# Patient Record
Sex: Male | Born: 1945 | Race: White | Hispanic: No | Marital: Single | Smoking: Current every day smoker
Health system: Southern US, Community
[De-identification: ages and names within clinical notes are randomized; demographics above are authoritative.]

## PROBLEM LIST (undated history)

## (undated) DIAGNOSIS — E119 Type 2 diabetes mellitus without complications: Secondary | ICD-10-CM

## (undated) DIAGNOSIS — J449 Chronic obstructive pulmonary disease, unspecified: Secondary | ICD-10-CM

## (undated) DIAGNOSIS — I4892 Unspecified atrial flutter: Secondary | ICD-10-CM

## (undated) DIAGNOSIS — D696 Thrombocytopenia, unspecified: Secondary | ICD-10-CM

## (undated) DIAGNOSIS — I10 Essential (primary) hypertension: Secondary | ICD-10-CM

## (undated) DIAGNOSIS — C9590 Leukemia, unspecified not having achieved remission: Secondary | ICD-10-CM

## (undated) DIAGNOSIS — I82419 Acute embolism and thrombosis of unspecified femoral vein: Secondary | ICD-10-CM

## (undated) DIAGNOSIS — D649 Anemia, unspecified: Secondary | ICD-10-CM

## (undated) DIAGNOSIS — C4491 Basal cell carcinoma of skin, unspecified: Secondary | ICD-10-CM

## (undated) DIAGNOSIS — F0390 Unspecified dementia without behavioral disturbance: Secondary | ICD-10-CM

## (undated) DIAGNOSIS — I4891 Unspecified atrial fibrillation: Secondary | ICD-10-CM

---

## 2016-06-01 DIAGNOSIS — R7881 Bacteremia: Secondary | ICD-10-CM

## 2016-06-01 HISTORY — DX: Bacteremia: R78.81

## 2016-07-02 HISTORY — PX: MITRAL VALVE REPLACEMENT: SHX147

## 2019-12-08 ENCOUNTER — Other Ambulatory Visit: Payer: Self-pay

## 2019-12-08 ENCOUNTER — Encounter (HOSPITAL_COMMUNITY): Payer: Self-pay

## 2019-12-08 DIAGNOSIS — L988 Other specified disorders of the skin and subcutaneous tissue: Secondary | ICD-10-CM | POA: Diagnosis present

## 2019-12-08 NOTE — ED Triage Notes (Signed)
Pt presents with a mass on his left arm and wants it evaluated. Pt reports it has been present for 5-7 months. Hx on skin cancer on same arm.

## 2019-12-09 ENCOUNTER — Emergency Department (HOSPITAL_COMMUNITY)
Admission: EM | Admit: 2019-12-09 | Discharge: 2019-12-09 | Disposition: A | Discharge status: Home / Self Care | Payer: Medicare Other | Attending: Emergency Medicine | Admitting: Emergency Medicine

## 2019-12-09 DIAGNOSIS — L989 Disorder of the skin and subcutaneous tissue, unspecified: Secondary | ICD-10-CM

## 2019-12-09 NOTE — ED Notes (Signed)
Pt states he was told to come to the ED and get a note stating the area on his arm is not contagious so he can return to his residence.

## 2019-12-09 NOTE — ED Provider Notes (Signed)
[facility] DEPT Provider Note   CSN: [id number] Arrival date & time: 12/08/19  2206     History Chief Complaint  Patient presents with  . Mass    [name] is a 74 y.o. male.  Patient is a 74 year old male with no significant past medical history.  He presents today for evaluation of arm sores.  Patient tells me that these have been present for several years.  He was told at a hospital in [place] that he had skin cancer, however has not followed up for this.  Patient was told by a family member he needed to come here to make sure that these areas are not contagious.  Patient denies any fevers or chills.  The history is provided by the patient.       History reviewed. No pertinent past medical history.  There are no problems to display for this patient.   History reviewed. No pertinent surgical history.     No family history on file.  Social History   Tobacco Use  . Smoking status: Not on file  Substance Use Topics  . Alcohol use: Not on file  . Drug use: Not on file    Home Medications Prior to Admission medications   Not on File    Allergies    Patient has no known allergies.  Review of Systems   Review of Systems  All other systems reviewed and are negative.   Physical Exam Updated Vital Signs Ht 6' (1.829 m)   Wt 79.4 kg   BMI 23.73 kg/m   Physical Exam Vitals and nursing note reviewed.  Constitutional:      General: He is not in acute distress.    Appearance: Normal appearance. He is not ill-appearing.  HENT:     Head: Normocephalic and atraumatic.  Pulmonary:     Effort: Pulmonary effort is normal.  Skin:    General: Skin is warm and dry.     Comments: The left wrist is noted to have an indurated area measuring approximately 3 cm x 4 cm with central necrotic, dry skin.  There is a second area noted to the dorsal aspect of the mid forearm that appears indurated, swollen, and somewhat inflamed.  There is  no purulent discharge or warmth.  Neurological:     Mental Status: He is alert and oriented to person, place, and time.     ED Results / Procedures / Treatments   Labs (all labs ordered are listed, but only abnormal results are displayed) Labs Reviewed - No data to display  EKG None  Radiology No results found.  Procedures Procedures (including critical care time)  Medications Ordered in ED Medications - No data to display  ED Course  I have reviewed the triage vital signs and the nursing notes.  Pertinent labs & imaging results that were available during my care of the patient were reviewed by me and considered in my medical decision making (see chart for details).    MDM Rules/Calculators/A&P  Patient with arm lesions highly concerning for skin cancer.  He was told several years ago that the lesion to his wrist was cancerous, however never followed up for this.  Patient will be given follow-up information for dermatology.  He will also be given a note stating that these lesions are not contagious.  Final Clinical Impression(s) / ED Diagnoses Final diagnoses:  None    Rx / DC Orders ED Discharge Orders    None  Veryl Speak, MD 12/09/19 [phone]

## 2019-12-09 NOTE — Discharge Instructions (Addendum)
Follow-up with dermatology in the next week.  The contact information for the dermatology clinic has been provided in this discharge summary for you to call and make these arrangements.  I suspect these lesions on your arm are cancerous and they are not contagious.

## 2020-01-09 ENCOUNTER — Ambulatory Visit: Payer: Medicare Other | Admitting: Family Medicine

## 2020-01-26 ENCOUNTER — Ambulatory Visit: Payer: Medicare Other | Admitting: Family Medicine

## 2020-01-29 ENCOUNTER — Ambulatory Visit (INDEPENDENT_AMBULATORY_CARE_PROVIDER_SITE_OTHER): Payer: Medicare Other | Admitting: Family Medicine

## 2020-01-29 ENCOUNTER — Encounter: Payer: Self-pay | Admitting: Family Medicine

## 2020-01-29 ENCOUNTER — Other Ambulatory Visit: Payer: Self-pay

## 2020-01-29 VITALS — BP 121/85 | HR 125 | Ht 72.0 in | Wt 176.0 lb

## 2020-01-29 DIAGNOSIS — J209 Acute bronchitis, unspecified: Secondary | ICD-10-CM | POA: Insufficient documentation

## 2020-01-29 DIAGNOSIS — L989 Disorder of the skin and subcutaneous tissue, unspecified: Secondary | ICD-10-CM | POA: Diagnosis not present

## 2020-01-29 DIAGNOSIS — J440 Chronic obstructive pulmonary disease with acute lower respiratory infection: Secondary | ICD-10-CM

## 2020-01-29 DIAGNOSIS — R296 Repeated falls: Secondary | ICD-10-CM | POA: Diagnosis not present

## 2020-01-29 DIAGNOSIS — Z952 Presence of prosthetic heart valve: Secondary | ICD-10-CM | POA: Diagnosis not present

## 2020-01-29 DIAGNOSIS — Z951 Presence of aortocoronary bypass graft: Secondary | ICD-10-CM

## 2020-01-29 DIAGNOSIS — Z0000 Encounter for general adult medical examination without abnormal findings: Secondary | ICD-10-CM

## 2020-01-29 DIAGNOSIS — Z7689 Persons encountering health services in other specified circumstances: Secondary | ICD-10-CM | POA: Diagnosis present

## 2020-01-29 DIAGNOSIS — Z8679 Personal history of other diseases of the circulatory system: Secondary | ICD-10-CM | POA: Diagnosis not present

## 2020-01-29 NOTE — Assessment & Plan Note (Addendum)
Appears to be some gaps in medical care. Patient is poor historian and will likely need several visits to optimize his healthcare needs.  -CBC, CMP, HCV, lipid panel, TSH -CCM referral -Bring healthcare records/shot records -Follow up in 1 week

## 2020-01-29 NOTE — Assessment & Plan Note (Signed)
Current smoker. 61 year hx of smoking. No desire to quit at this time. No medications at this time.  -Consider CXR, if patient has acute respiratory symptoms -CCM referral -Follow up in 1 week

## 2020-01-29 NOTE — Progress Notes (Signed)
    SUBJECTIVE:   CHIEF COMPLAINT / HPI:   New Patient Visit/ Establish care [name] is a 74 yo M who presents today to establish care, recently lived in [place]. He is retired and living with his son and girlfriend. He does not take any prescription medications but take a lot of vitamins.   Multiple skin lesions Recently seen at [facility] 12/09/2019 for a possible skin cancer. Was referred to a dermatologist but has not been able to get in touch with the office. Actively bleeding today.   COPD Current tobacco pipe smoker. Smoking since the age of 74 yo. Uses his pipe 6x daily. Not interested in stopping at this time since he has done it for so long. Denies shortness of breath.    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 Age appropriate. Cardiac: RRR, normal heart sounds, no murmurs Respiratory: CTAB w/ long expiratory phase, normal effort Extremities: No edema or cyanosis. Skin:         Neuro: alert and oriented, no focal deficits Psych: normal affect   ASSESSMENT/PLAN:   Encounter to establish care Appears to be some gaps in medical care. Patient is poor historian and will likely need several visits to optimize his healthcare needs.  -CBC, CMP, HCV, lipid panel, TSH -CCM referral -Bring healthcare records/shot records -Follow up in 1 week    Skin lesions Acute. Left forearm actively bleeding prior to today's visit. ED visit >1 mont prior. Lesions look suspiciously like cancer. Attempted to call dermatology clinic on patient's AVS from ED x2 w/o success.  -Referral to dermatology placed -f/u prn  Acute bronchitis with COPD ([place]) Current smoker. 62 year hx of smoking. No desire to quit at this time. No medications at  this time.  -Consider CXR, if patient has acute respiratory symptoms -CCM referral -Follow up in 1 week    [place]

## 2020-01-29 NOTE — Assessment & Plan Note (Signed)
Acute. Left forearm actively bleeding prior to today's visit. ED visit >1 mont prior. Lesions look suspiciously like cancer. Attempted to call dermatology clinic on patient's AVS from ED x2 w/o success.  -Referral to dermatology placed -f/u prn

## 2020-01-29 NOTE — Patient Instructions (Addendum)
It was very nice to meet you today. Please enjoy the rest of your week. Today you were seen for a new patient visit.  I have referred you to the dermatologist. You should get a call to make an appointment in 1 week. If you do not hear from them please give [place] a call.   I have also referred your to our chronic care management team. Since you have not been to the doctor in awhile we will have to get you caught up on what mediations your should be on for your conditions.   Pleas consider quitting smoking. This will be beneficial for your overall health.   I have gotten some labs today. If abnormal I will give you a call. If normal I will send a letter via mail.    Follow up in 1-2 weeks for a follow up visit or sooner if needed. Please bring ALL vitamins and records to your next visit.   Please call the clinic at [phone] if you have any concerns. It was our pleasure to serve you.  Dr. Janus Molder

## 2020-01-30 ENCOUNTER — Encounter: Payer: Self-pay | Admitting: Family Medicine

## 2020-01-30 ENCOUNTER — Telehealth: Payer: Self-pay | Admitting: *Deleted

## 2020-01-30 DIAGNOSIS — Z0000 Encounter for general adult medical examination without abnormal findings: Secondary | ICD-10-CM

## 2020-01-30 DIAGNOSIS — D649 Anemia, unspecified: Secondary | ICD-10-CM

## 2020-01-30 LAB — COMPREHENSIVE METABOLIC PANEL
ALT: 17 IU/L (ref 0–44)
AST: 22 IU/L (ref 0–40)
Albumin/Globulin Ratio: 1.1 — ABNORMAL LOW (ref 1.2–2.2)
Albumin: 3.9 g/dL (ref 3.7–4.7)
Alkaline Phosphatase: 93 IU/L (ref 48–121)
BUN/Creatinine Ratio: 32 — ABNORMAL HIGH (ref 10–24)
BUN: 34 mg/dL — ABNORMAL HIGH (ref 8–27)
Bilirubin Total: 0.4 mg/dL (ref 0.0–1.2)
CO2: 25 mmol/L (ref 20–29)
Calcium: 9 mg/dL (ref 8.6–10.2)
Chloride: 104 mmol/L (ref 96–106)
Creatinine, Ser: 1.06 mg/dL (ref 0.76–1.27)
GFR calc Af Amer: 79 mL/min/{1.73_m2} (ref >59)
GFR calc non Af Amer: 68 mL/min/{1.73_m2} (ref >59)
Globulin, Total: 3.4 g/dL (ref 1.5–4.5)
Glucose: 101 mg/dL — ABNORMAL HIGH (ref 65–99)
Potassium: 4.5 mmol/L (ref 3.5–5.2)
Sodium: 139 mmol/L (ref 134–144)
Total Protein: 7.3 g/dL (ref 6.0–8.5)

## 2020-01-30 LAB — LIPID PANEL
Chol/HDL Ratio: 2.6 ratio (ref 0.0–5.0)
Cholesterol, Total: 153 mg/dL (ref 100–199)
HDL: 60 mg/dL (ref >39)
LDL Chol Calc (NIH): 77 mg/dL (ref 0–99)
Triglycerides: 82 mg/dL (ref 0–149)
VLDL Cholesterol Cal: 16 mg/dL (ref 5–40)

## 2020-01-30 LAB — CBC
Hematocrit: 34.7 % — ABNORMAL LOW (ref 37.5–51.0)
Hemoglobin: 11.2 g/dL — ABNORMAL LOW (ref 13.0–17.7)
MCH: 28 pg (ref 26.6–33.0)
MCHC: 32.3 g/dL (ref 31.5–35.7)
MCV: 87 fL (ref 79–97)
Platelets: 103 10*3/uL — ABNORMAL LOW (ref 150–450)
RBC: 4 x10E6/uL — ABNORMAL LOW (ref 4.14–5.80)
RDW: 14.3 % (ref 11.6–15.4)
WBC: 4.9 10*3/uL (ref 3.4–10.8)

## 2020-01-30 LAB — HCV INTERPRETATION

## 2020-01-30 LAB — TSH: TSH: 1.87 u[IU]/mL (ref 0.450–4.500)

## 2020-01-30 LAB — HCV AB W REFLEX TO QUANT PCR: HCV Ab: 0.1 s/co ratio (ref 0.0–0.9)

## 2020-01-30 NOTE — Chronic Care Management (AMB) (Signed)
  Care Management   Note  01/30/2020 Name: ASHWATH LASCH MRN: [id number] DOB: 1945/08/02  Arnette Schaumann Wiebelhaus is a 74 y.o. year old male who is a primary care patient of Patient, No Pcp Per. I reached out to [name] by phone today in response to a referral sent by Mr. Pearl Berlinger Kozlowski's health plan.    Mr. Dygert was given information about care management services today including:  1. Care management services include personalized support from designated clinical staff supervised by his physician, including individualized plan of care and coordination with other care providers 2. 24/7 contact phone numbers for assistance for urgent and routine care needs. 3. The patient may stop care management services at any time by phone call to the office staff.  Patient agreed to services and verbal consent obtained.   Follow up plan: Telephone appointment with care management team member scheduled for: 02/07/2020  [place] Management

## 2020-02-01 NOTE — Telephone Encounter (Signed)
Called patient's with results of low hemoglobin.  Has never had a colonoscopy. Denies any blood in stool.  Referral placed for colonoscopy during this encounter.  Patient understands will be called to schedule.  All other labs are normal.  [name], DO 02/01/2020, 5:47 PM PGY-2, [place]

## 2020-02-07 ENCOUNTER — Ambulatory Visit: Payer: Medicare Other

## 2020-02-07 DIAGNOSIS — Z139 Encounter for screening, unspecified: Secondary | ICD-10-CM

## 2020-02-08 NOTE — Chronic Care Management (AMB) (Signed)
Care Management   Initial Visit Note  02/08/2020 Name: [name] MRN: [id number] DOB: 1945/09/11   Assessment: [name] is a 74 y.o. year old male who sees [name], No Pcp Per for primary care. The care management team was consulted for assistance with care management and care coordination needs related to Disease Management Educational COPD  Needs.   Review of [name] status, including review of consultants reports, relevant laboratory and other test results, and collaboration with appropriate care team members and the [name]'s provider was performed as part of comprehensive [name] evaluation and provision of care management services.    SDOH (Social Determinants of Health) assessments performed: Yes See Care Plan activities for detailed interventions related to [facility])     Outpatient Encounter Medications as of 02/07/2020  Medication Sig  . aspirin EC 81 MG tablet Take 81 mg by mouth daily. Swallow whole.  . Multiple Vitamin (MULTI-VITAMIN DAILY) TABS Take by mouth.  . NON FORMULARY Take 2 tablets by mouth daily. [name] states that he takes Bladder release that he purchases online   No facility-administered encounter medications on file as of 02/07/2020.    Goals Addressed              This Visit's Progress   .  I can't walk far without hahaving to stop. (pt-stated)        CARE PLAN ENTRY (see longitudinal plan of care for additional care plan information) Current Barriers:   Knowledge deficit related to basic COPD self care/management- the [name] stated that he just moved here and he lives with his son and his girl friend.  He is unable to walk ling distances without being short of breath or doing a lot of thing with out being short of breath.  Where he lived he said he had an inhaler that was given to him for free but he lost it and does not remember the name.  He states that he currently smokes but he says smoking his tobacco pipe is cutting down for him.    Knowledge deficit related to basic understanding of how to use inhalers and how inhaled medications work  Knowledge deficit related to importance of energy conservation   Nurse Case Manager Clinical Goal(s):  Over the next 30 days [name] will report utilizing pursed lip breathing for shortness of breath  Over the next 30 days, [name] will be able to verbalize understanding of COPD action plan and when to seek appropriate levels of medical care   Interventions:   Provided [name] with basic verbal COPD education on self care/management/and exacerbation prevention   Provided [name] with COPD action plan and reinforced importance of daily self assessment  Will mail the [name]  written instructions on pursed lip breathing and COPD  Discussed with the [name] on smoking cessation and how it affects his breathing  Will send a referral to the care guides to help the [name] with transportation.  He has a friend that takes him places now but will be leaving on the 15th of this month and he will no longer have transportation.    [name] Self Care Activities:    Practice and use pursed lip breathing for shortness of breath recovery and prevention  [name] will try to cut down on his smoking        Follow up plan:  The care management team will reach out to the [name] again over the next 14 days.   Mr. Mohon was given information about Care Management  services today including:  1. Care Management services include personalized support from designated clinical staff supervised by a physician, including individualized plan of care and coordination with other care providers 2. 24/7 contact phone numbers for assistance for urgent and routine care needs. 3. The [name] may stop Care Management services at any time (effective at the end of the month) by phone call to the office staff.  [name] agreed to services and verbal consent obtained.  Lazaro Arms RN, BSN, [facility] Care  Management Coordinator [place] Phone: [phone] Fax: [phone]

## 2020-02-08 NOTE — Patient Instructions (Signed)
Visit Information  Goals Addressed              This Visit's Progress   .  I can't walk far without hahaving to stop. (pt-stated)        CARE PLAN ENTRY (see longitudinal plan of care for additional care plan information) Current Barriers:   Knowledge deficit related to basic COPD self care/management- the patient stated that he just moved here and he lives with his son and his girl friend.  He is unable to walk ling distances without being short of breath or doing a lot of thing with out being short of breath.  Where he lived he said he had an inhaler that was given to him for free but he lost it and does not remember the name.  He states that he currently smokes but he says smoking his tobacco pipe is cutting down for him.   Knowledge deficit related to basic understanding of how to use inhalers and how inhaled medications work  Knowledge deficit related to importance of energy conservation   Nurse Case Manager Clinical Goal(s):  Over the next 30 days patient will report utilizing pursed lip breathing for shortness of breath  Over the next 30 days, patient will be able to verbalize understanding of COPD action plan and when to seek appropriate levels of medical care   Interventions:   Provided patient with basic verbal COPD education on self care/management/and exacerbation prevention   Provided patient with COPD action plan and reinforced importance of daily self assessment  Will mail the patient  written instructions on pursed lip breathing and COPD  Discussed with the patient on smoking cessation and how it affects his breathing  Will send a referral to the care guides to help the patient with transportation.  He has a friend that takes him places now but will be leaving on the 15th of this month and he will no longer have transportation.    Patient Self Care Activities:    Practice and use pursed lip breathing for shortness of breath recovery and prevention  Patient  will try to cut down on his smoking       Mr. Lux was given information about Care Management services today including:  1. Care Management services include personalized support from designated clinical staff supervised by his physician, including individualized plan of care and coordination with other care providers 2. 24/7 contact phone numbers for assistance for urgent and routine care needs. 3. The patient may stop CCM services at any time (effective at the end of the month) by phone call to the office staff.  Patient agreed to services and verbal consent obtained.   The patient verbalized understanding of instructions provided today and declined a print copy of patient instruction materials.   The care management team will reach out to the patient again over the next 14 days.   Lazaro Arms RN, BSN, [facility] Care Management Coordinator [place] Phone: [phone] Fax: [phone]

## 2020-02-20 ENCOUNTER — Telehealth: Payer: Self-pay | Admitting: General Practice

## 2020-02-20 NOTE — Telephone Encounter (Signed)
[name] 02/20/2020 Called pt regarding community resource referral received. Left message for pt to call me back, my info is [phone] please see ref notes for more details.  [place], Care Management

## 2020-02-26 ENCOUNTER — Telehealth: Payer: Medicare Other

## 2020-03-04 ENCOUNTER — Telehealth: Payer: Medicare Other

## 2020-03-04 ENCOUNTER — Telehealth: Payer: Self-pay | Admitting: *Deleted

## 2020-03-04 NOTE — Chronic Care Management (AMB) (Signed)
  Care Management   Note  03/04/2020 Name: [name] MRN: [id number] DOB: 29-Dec-1945  [name] is a 74 y.o. year old male who is a primary care patient of Patient, No Pcp Per and is actively engaged with the care management team. I reached out to [name] by phone today to assist with re-scheduling a follow up visit with the RN Case Manager.  Follow up plan: Unsuccessful telephone outreach attempt made. A HIPAA compliant phone message was left for the patient providing contact information and requesting a return call. The care management team will reach out to the patient again over the next 7 days. If patient returns call to provider office, please advise to call [place] at [phone].  [place] Management

## 2020-03-08 NOTE — Chronic Care Management (AMB) (Signed)
  Care Management   Note  03/08/2020 Name: TAYGEN ACKLIN MRN: [id number] DOB: 1946/05/29  Arnette Schaumann Curci is a 74 y.o. year old male who is a primary care patient of Patient, No Pcp Per and is actively engaged with the care management team. I reached out to [name] by phone today to assist with re-scheduling a follow up visit with the RN Case Manager.  Follow up plan: Telephone appointment with care management team member scheduled for:03/18/2020  [place] Management

## 2020-03-18 ENCOUNTER — Ambulatory Visit: Payer: Medicare Other

## 2020-03-19 NOTE — Patient Instructions (Signed)
Visit Information  Goals Addressed              This Visit's Progress   .  I can't walk far without hahaving to stop. (pt-stated)        CARE PLAN ENTRY (see longitudinal plan of care for additional care plan information) Current Barriers:   Knowledge deficit related to basic COPD self care/management- the patient stated that he just moved here and he lives with his son and his girl friend.  He is unable to walk ling distances without being short of breath or doing a lot of thing with out being short of breath.  Where he lived he said he had an inhaler that was given to him for free but he lost it and does not remember the name.  He states that he currently smokes but he says smoking his tobacco pipe is cutting down for him.   Knowledge deficit related to basic understanding of how to use inhalers and how inhaled medications work  Knowledge deficit related to importance of energy conservation   Nurse Case Manager Clinical Goal(s):  Over the next 30 days patient will report utilizing pursed lip breathing for shortness of breath  Over the next 30 days, patient will be able to verbalize understanding of COPD action plan and when to seek appropriate levels of medical care   Interventions:   Provided patient with basic verbal COPD education on self care/management/and exacerbation prevention   Provided patient with COPD action plan and reinforced importance of daily self assessment  Will mail the patient  written instructions on pursed lip breathing and COPD  Discussed with the patient on smoking cessation and how it affects his breathing  Will send a referral to the care guides to help the patient with transportation.  He has a friend that takes him places now but will be leaving on the 15th of this month and he will no longer have transportation.  Spoke with the patient and he talked with the care guides but he didn't know about his medicaid information.  I asked him to call DSS  or have his sone to help him to find his medicaid number so we can help him with his transportation. He verbalized understanding and stated he would have some information for me at the next call.  Advised the patient that I would resend the educational material again for purse lip breathing and COPD information.   Patient Self Care Activities:    Practice and use pursed lip breathing for shortness of breath recovery and prevention  Patient will try to cut down on his smoking       The patient verbalized understanding of instructions provided today and declined a print copy of patient instruction materials.   The care management team will reach out to the patient again over the next 14 days.   Shawn Arms RN, BSN, [facility] Care Management Coordinator [place] Phone: [phone] Fax: [phone]

## 2020-03-19 NOTE — Chronic Care Management (AMB) (Signed)
Care Management   Follow Up Note   03/19/2020 Name: [name] MRN: [id number] DOB: 03-07-1946  Referred by: Patient, No Pcp Per Reason for referral : Appointment (COPD)   ALVA BROXSON is a 74 y.o. year old male who is a primary care patient of Patient, No Pcp Per. The care management team was consulted for assistance with care management and care coordination needs.    Review of patient status, including review of consultants reports, relevant laboratory and other test results, and collaboration with appropriate care team members and the patient's provider was performed as part of comprehensive patient evaluation and provision of chronic care management services.    SDOH (Social Determinants of Health) assessments performed: No See Care Plan activities for detailed interventions related to [facility])     Advanced Directives: See Care Plan and [employer] application for related entries.   Goals Addressed              This Visit's Progress   .  I can't walk far without hahaving to stop. (pt-stated)        CARE PLAN ENTRY (see longitudinal plan of care for additional care plan information) Current Barriers:   Knowledge deficit related to basic COPD self care/management- the patient stated that he just moved here and he lives with his son and his girl friend.  He is unable to walk ling distances without being short of breath or doing a lot of thing with out being short of breath.  Where he lived he said he had an inhaler that was given to him for free but he lost it and does not remember the name.  He states that he currently smokes but he says smoking his tobacco pipe is cutting down for him.   Knowledge deficit related to basic understanding of how to use inhalers and how inhaled medications work  Knowledge deficit related to importance of energy conservation   Nurse Case Manager Clinical Goal(s):  Over the next 30 days patient will report utilizing pursed lip breathing for  shortness of breath  Over the next 30 days, patient will be able to verbalize understanding of COPD action plan and when to seek appropriate levels of medical care   Interventions:   Provided patient with basic verbal COPD education on self care/management/and exacerbation prevention   Provided patient with COPD action plan and reinforced importance of daily self assessment  Will mail the patient  written instructions on pursed lip breathing and COPD  Discussed with the patient on smoking cessation and how it affects his breathing  Will send a referral to the care guides to help the patient with transportation.  He has a friend that takes him places now but will be leaving on the 15th of this month and he will no longer have transportation.  Spoke with the patient and he talked with the care guides but he didn't know about his medicaid information.  I asked him to call DSS or have his sone to help him to find his medicaid number so we can help him with his transportation. He verbalized understanding and stated he would have some information for me at the next call.  Advised the patient that I would resend the educational material again for purse lip breathing and COPD information.   Patient Self Care Activities:    Practice and use pursed lip breathing for shortness of breath recovery and prevention  Patient will try to cut down on his smoking  The care management team will reach out to the patient again over the next 14 days.   Lazaro Arms RN, BSN, [facility] Care Management Coordinator [place] Phone: [phone] Fax: [phone]

## 2020-04-04 ENCOUNTER — Telehealth: Payer: Medicare Other

## 2020-04-05 ENCOUNTER — Ambulatory Visit: Payer: Medicare Other

## 2020-04-05 NOTE — Patient Instructions (Signed)
Visit Information  Goals Addressed              This Visit's Progress   .  I can't walk far without having to stop. (pt-stated)        CARE PLAN ENTRY (see longitudinal plan of care for additional care plan information) Current Barriers:   Knowledge deficit related to basic COPD self care/management- the patient stated that he just moved here and he lives with his son and his girl friend.  He is unable to walk ling distances without being short of breath or doing a lot of thing with out being short of breath.  Where he lived he said he had an inhaler that was given to him for free but he lost it and does not remember the name.  He states that he currently smokes but he says smoking his tobacco pipe is cutting down for him.   Knowledge deficit related to basic understanding of how to use inhalers and how inhaled medications work  Knowledge deficit related to importance of energy conservation   Nurse Case Manager Clinical Goal(s):  Over the next 30 days patient will report utilizing pursed lip breathing for shortness of breath  Over the next 30 days, patient will be able to verbalize understanding of COPD action plan and when to seek appropriate levels of medical care   Interventions:   Provided patient with basic verbal COPD education on self care/management/and exacerbation prevention   Provided patient with COPD action plan and reinforced importance of daily self assessment  Will mail the patient  written instructions on pursed lip breathing and COPD  Discussed with the patient on smoking cessation and how it affects his breathing  Will send a referral to the care guides to help the patient with transportation.  He has a friend that takes him places now but will be leaving on the 15th of this month and he will no longer have transportation.  Spoke with the patient and he has not followed up with the information that was given to him.    He states that he wants information  for medicaid and to talk with some one about medicare insurance.  RNCM gave the patient  Medicaid  - local Department of social Services on online applications   DEPARTMENT OF SOCIAL SERVICES: [place], [place] [address]    [phone]  [place] [email or web address]   Call [phone]  I gave him my contact information to follow up with me if he has any questions or concerns.  He states that he will follow up with the numbers.  He stays with his son and daughter in law and  they will help him.  Patient Self Care Activities:    Practice and use pursed lip breathing for shortness of breath recovery and prevention  Patient will try to cut down on his smoking       Mr. Manthei was given information about Care Management services today including:  1. Care Management services include personalized support from designated clinical staff supervised by his physician, including individualized plan of care and coordination with other care providers 2. 24/7 contact phone numbers for assistance for urgent and routine care needs. 3. The patient may stop CCM services at any time (effective at the end of the month) by phone call to the office staff.  Patient agreed to services and verbal consent obtained.   The patient verbalized understanding of instructions provided today and declined a print copy of patient instruction  materials.   Plan: Telephone follow up with [name] over the next 14 days giving the patient time to reach out to the resources and he has my contact information if he has any question  Lazaro Arms RN, BSN, [place] Phone: [phone] Fax: [phone]

## 2020-04-05 NOTE — Chronic Care Management (AMB) (Signed)
RN  Care Management   Follow Up Note   04/05/2020 Name: RAVEN HARMES MRN: [id number] DOB: 10/16/1945  Reason for referral : Care Coordination (Transportation)   SADAO WEYER is a 74 y.o. year old male who is a primary care patient of Patient, No Pcp Per. The care management team was consulted for assistance with care management and care coordination needs.    Subjective:  I have not follow up with the resource information"  Assessment: called the patient to see if he has follow up with any resource information and he states that he has not .   Goals    .  I can't walk far without having to stop. (pt-stated)      CARE PLAN ENTRY (see longitudinal plan of care for additional care plan information) Current Barriers:   Knowledge deficit related to basic COPD self care/management- the patient stated that he just moved here and he lives with his son and his girl friend.  He is unable to walk ling distances without being short of breath or doing a lot of thing with out being short of breath.  Where he lived he said he had an inhaler that was given to him for free but he lost it and does not remember the name.  He states that he currently smokes but he says smoking his tobacco pipe is cutting down for him.   Knowledge deficit related to basic understanding of how to use inhalers and how inhaled medications work  Knowledge deficit related to importance of energy conservation   Nurse Case Manager Clinical Goal(s):  Over the next 30 days patient will report utilizing pursed lip breathing for shortness of breath  Over the next 30 days, patient will be able to verbalize understanding of COPD action plan and when to seek appropriate levels of medical care   Interventions:   Provided patient with basic verbal COPD education on self care/management/and exacerbation prevention   Provided patient with COPD action plan and reinforced importance of daily self assessment  Will mail the  patient  written instructions on pursed lip breathing and COPD  Discussed with the patient on smoking cessation and how it affects his breathing  Will send a referral to the care guides to help the patient with transportation.  He has a friend that takes him places now but will be leaving on the 15th of this month and he will no longer have transportation.  Spoke with the patient and he has not followed up with the information that was given to him.    He states that he wants information for medicaid and to talk with some one about medicare insurance.  RNCM gave the patient  Medicaid  - local Department of social Services on online applications   DEPARTMENT OF SOCIAL SERVICES: [place], [place] [address]    [phone]  [place] [email or web address]   Call [phone]  I gave him my contact information to follow up with me if he has any questions or concerns.  He states that he will follow up with the numbers.  He stays with his son and daughter in law and  they will help him.  Patient Self Care Activities:    Practice and use pursed lip breathing for shortness of breath recovery and prevention  Patient will try to cut down on his smoking        Review of patient status, including review of consultants reports, relevant laboratory and other test results,  and collaboration with appropriate care team members and the patient's provider was performed as part of comprehensive patient evaluation and provision of chronic care management services.    SDOH (Social Determinants of Health) assessments performed: No See Care Plan activities for detailed interventions related to SDOH)         Plan: Telephone follow up with [name] over the next 14 days giving the patient time to reach out to the resources and he has my contact information if he has any questions.Lazaro Arms RN, BSN, [facility] Care Management Coordinator [place] Phone: [phone] Fax: [phone]

## 2020-04-19 ENCOUNTER — Ambulatory Visit: Payer: Medicare Other

## 2020-04-19 NOTE — Patient Instructions (Signed)
Visit Information  Goals Addressed              This Visit's Progress   .  I can't walk far without having to stop. (pt-stated)        Patient Self Care Activities:  . Patient verbalizes understanding of plan .  Self-administers medications as prescribed . Calls pharmacy for medication refills . Call's provider office for new concerns or questions . Practice and use pursed lip breathing for shortness of breath recovery and prevention . Patient will try to cut down on his smoking . Follow up on information given him for medicaid and call care guides to help with transportation        [name] was given information about Care Management services today including:  1. Care Management services include personalized support from designated clinical staff supervised by his physician, including individualized plan of care and coordination with other care providers 2. 24/7 contact phone numbers for assistance for urgent and routine care needs. 3. The patient may stop CCM services at any time (effective at the end of the month) by phone call to the office staff.  Patient agreed to services and verbal consent obtained.   The patient verbalized understanding of instructions, educational materials, and care plan provided today and declined offer to receive copy of patient instructions, educational materials, and care plan.   Follow up Plan:  The patient will call RNCM as advised to what he plans to do about his medicaid and transportation.  RNCM will wait and follow back up with the patient in one month to see progress.  Shawn Arms RN, BSN, [facility] Care Management Coordinator [place] Phone: [phone] I Fax: [phone]

## 2020-04-19 NOTE — Chronic Care Management (AMB) (Signed)
RN  Care Management   Follow Up Note   04/19/2020 Name: [name] MRN: [id number] DOB: [birth year]  Reason for referral : Care Coordination (Paperwork follow up)   [name] is a 74 y.o. year old male who is a primary care patient of [place], Naaman Plummer, DO. The care management team was consulted for assistance with care management and care coordination needs.      Assessment: Called to follow with the patient to see if he had called Medicaid and had his son and daughter  In law to help.  He stated that he still had not and would try today or next week.  Patient Care Plan: RN Care Manager  Problem Identified: COPD   Goal: COPD Management   Start Date: 04/19/2020  Expected End Date: 05/31/2020  Note:   Current Barriers:   Knowledge deficit related to basic COPD self care/management- the patient stated that he just moved here and he lives with his son and his girl friend.  He is unable to walk ling distances without being short of breath or doing a lot of thing with out being short of breath.  Where he lived he said he had an inhaler that was given to him for free but he lost it and does not remember the name.  He states that he currently smokes but he says smoking his tobacco pipe is cutting down for him.   Knowledge deficit related to basic understanding of how to use inhalers and how inhaled medications work  Knowledge deficit related to importance of energy conservation   Nurse Case Manager Clinical Goal(s):  Over the next 30 days patient will report utilizing pursed lip breathing for shortness of breath  Over the next 30 days, patient will be able to verbalize understanding of COPD action plan and when to seek appropriate levels of medical care   Interventions:   Provided patient with basic verbal COPD education on self care/management/and exacerbation prevention   Provided patient with COPD action plan and reinforced importance of daily self  assessment  Will mail the patient  written instructions on pursed lip breathing and COPD- Patient received information  Discussed with the patient on smoking cessation and how it affects his breathing  Will send a referral to the care guides to help the patient with transportation.  He has a friend that takes him places now but will be leaving on the 15th of this month and he will no longer have transportation.  Spoke with the patient again today and he has not called about his medicaid nor has he contact anyone about transportation.  As we were talking he stated that he had to get off the phone and he would call me back.   Patient Self Care Activities:  . Patient verbalizes understanding of plan .  Self-administers medications as prescribed . Calls pharmacy for medication refills . Call's provider office for new concerns or questions . Practice and use pursed lip breathing for shortness of breath recovery and prevention . Patient will try to cut down on his smoking . Follow up on information given him for medicaid and call care guides to help with transportation   Follow up Plan:  The patient will call RNCM as advised to what he plans to do about his medicaid and transportation.  RNCM will wait and follow back up with the patient in one month to see progress.    Review of patient status, including review of consultants reports, relevant laboratory  and other test results, and collaboration with appropriate care team members and the patient's provider was performed as part of comprehensive patient evaluation and provision of chronic care management services.    SDOH (Social Determinants of Health) assessments performed: No See Care Plan activities for detailed interventions related to SDOH)     [place], BSN, [place] Management Coordinator [place] Phone: [phone] Fax: [phone]

## 2020-05-06 ENCOUNTER — Ambulatory Visit: Payer: Medicare Other | Admitting: Family Medicine

## 2020-05-10 ENCOUNTER — Ambulatory Visit: Payer: Medicare Other

## 2020-05-21 ENCOUNTER — Telehealth: Payer: Self-pay

## 2020-05-21 ENCOUNTER — Telehealth: Payer: Medicare Other

## 2020-05-21 NOTE — Telephone Encounter (Signed)
  Care Management   Outreach Note  05/21/2020 Name: NEELY CECENA MRN: [id number] DOB: 26-Jan-1946  Referred by: [name], DO Reason for referral : Chronic Care Management (Transportation)   [name] is enrolled in a Managed [place]: No  An unsuccessful telephone outreach was attempted today. The patient was referred to the case management team for assistance with care management and care coordination.   Follow Up Plan: A HIPAA compliant phone message was left for the patient providing contact information and requesting a return call.  The care management team will reach out to the patient again over the next 7-14 days.   Lazaro Arms RN, BSN, [facility] Care Management Coordinator [place] Phone: [phone] I Fax: [phone]

## 2020-05-27 NOTE — Telephone Encounter (Signed)
[name]   Called spoke with patient rescheduled for follow up call with RNCM on 06/06/2020

## 2020-06-06 ENCOUNTER — Ambulatory Visit: Payer: Medicare Other

## 2020-06-06 DIAGNOSIS — L989 Disorder of the skin and subcutaneous tissue, unspecified: Secondary | ICD-10-CM

## 2020-06-06 DIAGNOSIS — Z139 Encounter for screening, unspecified: Secondary | ICD-10-CM

## 2020-06-07 NOTE — Chronic Care Management (AMB) (Signed)
Care Management   RN Case Manager Follow Up Note  06/07/2020 Name: [name] MRN: [id number] DOB: [birth year] [name] is a 75 y.o. year old male who sees Autry-Lott, [place], DO for primary care.  Patient is enrolled in a Managed Medicaid plan: No.  The Care Management team was consulted by PCPto assist the patient with . Disease Management, Educational Needs and Care Coordination.   RNCM engaged with [name] today Engaged with patient by telephone in response to provider referral for RN case management and/or care coordination services. See care plan below for details during this encounter.  Follow up Plan: RNCM will wait and follow back up with the patient in 14 days to see progress.   Advanced Directives Status:Not addressed in this encounter.     SDOH (Social Determinants of Health) assessments performed: No     Patient Care Plan: RN Care Manager  Problem Identified: COPD  Goal: COPD Management   Start Date: 04/19/2020  Expected End Date: 05/31/2020  Note:   Current Barriers:   Knowledge deficit related to basic COPD self care/management- the patient stated that he just moved here and he lives with his son and his girl friend.  He is unable to walk ling distances without being short of breath or doing a lot of thing with out being short of breath.  Where he lived he said he had an inhaler that was given to him for free but he lost it and does not remember the name.  He states that he currently smokes but he says smoking his tobacco pipe is cutting down for him.   Knowledge deficit related to basic understanding of how to use inhalers and how inhaled medications work  Knowledge deficit related to importance of energy conservation   Nurse Case Manager Clinical Goal(s):  Over the next 30 days patient will report utilizing pursed lip breathing for shortness of breath  Over the next 30 days, patient will be able to verbalize understanding of COPD action plan  and when to seek appropriate levels of medical care   Interventions:   Provided patient with basic verbal COPD education on self care/management/and exacerbation prevention   Provided patient with COPD action plan and reinforced importance of daily self assessment  Will mail the patient  written instructions on pursed lip breathing and COPD- Patient received information  Discussed with the patient on smoking cessation and how it affects his breathing Patient states that he has cut down on his smoking about 25 %  Spoke with the patient again today and he has not called about his medicaid nor has he contact anyone about transportation.  I will send a referral to care guides to set him up with [facility] transportation .  I have set him up another appointment with [facility] 08-16-20 at 11 am.  Patient Self Care Activities:  . Patient verbalizes understanding of plan .  Self-administers medications as prescribed . Calls pharmacy for medication refills . Call's provider office for new concerns or questions . Practice and use pursed lip breathing for shortness of breath recovery and prevention . Patient will try to cut down on his smoking . Follow up on information given him for medicaid and call care guides to help with transportation        Outpatient Encounter Medications as of 06/06/2020  Medication Sig  . aspirin EC 81 MG tablet Take 81 mg by mouth daily. Swallow whole.  . Multiple Vitamin (MULTI-VITAMIN DAILY) TABS Take  by mouth.  . NON FORMULARY Take 2 tablets by mouth daily. Patient states that he takes Bladder release that he purchases online   No facility-administered encounter medications on file as of 06/06/2020.    Review of patient status, including review of consultants reports, relevant laboratory and other test results, and collaboration with appropriate care team members and the patient's provider was performed as part of comprehensive patient evaluation and  provision of chronic care management services.       Information about Care Management services was shared with Mr.  Mcenery today including:  1. Care Management services include personalized support from designated clinical staff supervised by his physician, including individualized plan of care and coordination with other care providers 2. Remind patient of 24/7 contact phone numbers to provider's office for assistance with urgent and routine care needs. 3. Care Management services are voluntary and patient may stop at any time .   Patient agreed to services provided today and verbal consent obtained.

## 2020-06-07 NOTE — Patient Instructions (Signed)
  [name]  it was nice speaking with you. Please call me directly [phone] if you have questions about the goals we discussed.  Patient Self Care Activities:  . Patient verbalizes understanding of plan .  Self-administers medications as prescribed . Calls pharmacy for medication refills . Call's provider office for new concerns or questions . Practice and use pursed lip breathing for shortness of breath recovery and prevention . Patient will try to cut down on his smoking . Follow up  with care guides to help with transportation     Patient Care Plan: RN Care Manager  Problem Identified: COPD   Goal: COPD Management   Start Date: 04/19/2020  Expected End Date: 05/31/2020  Note:   Current Barriers:   Knowledge deficit related to basic COPD self care/management- the patient stated that he just moved here and he lives with his son and his girl friend.  He is unable to walk ling distances without being short of breath or doing a lot of thing with out being short of breath.  Where he lived he said he had an inhaler that was given to him for free but he lost it and does not remember the name.  He states that he currently smokes but he says smoking his tobacco pipe is cutting down for him.   Knowledge deficit related to basic understanding of how to use inhalers and how inhaled medications work  Knowledge deficit related to importance of energy conservation   Nurse Case Manager Clinical Goal(s):  Over the next 30 days patient will report utilizing pursed lip breathing for shortness of breath  Over the next 30 days, patient will be able to verbalize understanding of COPD action plan and when to seek appropriate levels of medical care   Interventions:   Provided patient with basic verbal COPD education on self care/management/and exacerbation prevention   Provided patient with COPD action plan and reinforced importance of daily self assessment  Will mail the patient  written  instructions on pursed lip breathing and COPD- Patient received information  Discussed with the patient on smoking cessation and how it affects his breathing Patient states that he has cut down on his smoking about 25 %  Spoke with the patient again today and he has not called about his medicaid nor has he contact anyone about transportation.  I will send a referral to care guides to set him up with [facility] transportation .  I have set him up another appointment with [facility] 08-16-20 at 11 am.  Follow up Plan:  .  RNCM will wait and follow back up with the patient in 14 days to see progress.      [name] received Care Management services today:  1. Care Management services include personalized support from designated clinical staff supervised by his physician, including individualized plan of care and coordination with other care providers 2. 24/7 contact [phone] for assistance for urgent and routine care needs. 3. Care Management are voluntary services and be declined at any time by calling the office.  The patient verbalized understanding of instructions provided today and declined a print copy of patient instruction materials.    Lazaro Arms, RN

## 2020-06-11 ENCOUNTER — Telehealth: Payer: Self-pay

## 2020-06-11 NOTE — Telephone Encounter (Signed)
    MA1/04/2021 1st Attempt  Name: [name]   MRN: [id number]   DOB: 13-Nov-1945   AGE: 75 y.o.   GENDER: male   PCP Autry-Lott, [place], DO.   Referral Reason: Transportation Needs   Interventions: Successful outbound call placed to the patient Patient provided with information about care guide support team and interviewed to confirm resource needs Placed referral to [employer] via email. Spoke with Suezanne Jacquet to confirm it has been received. The patient will be called once his information has been entered and he will be called. I requested a email confirmation be sent to me. I will forward the confirmation the to patient as requested.  Follow up plan: Care guide will follow up with patient by phone over the next 5 days     Brynlei Klausner, AAS Paralegal, [place] . Embedded Care Coordination [facility]  Care Management  [address], [place] [address] [email or web address]  [phone]   [email or web address]

## 2020-06-18 ENCOUNTER — Telehealth: Payer: Self-pay

## 2020-06-18 NOTE — Telephone Encounter (Signed)
   Telephone encounter was:  Successful.  06/18/2020 Name: BLAYDEN CONWELL MRN: [id number] DOB: 13-Aug-1945  BENNETT RAM is a 75 y.o. year old male who is a primary care patient of [place], Naaman Plummer, DO . The community resource team was consulted for assistance with Transportation Needs   Care guide performed the following interventions: Follow up call placed to community resources to determine status of patients referral Follow up call placed to the patient to discuss status of referral Spoke with Suezanne Jacquet at [employer] he is processing request and stated he would call the patient. Patient has not received a confirmation call yet..  Follow Up Plan:  Care guide will follow up with patient by phone over the next 5 days and I will follow-up again with [facility].  SIG Ambrose Mantle

## 2020-06-20 ENCOUNTER — Telehealth: Payer: Medicare Other

## 2020-06-20 ENCOUNTER — Telehealth: Payer: Self-pay

## 2020-06-20 ENCOUNTER — Telehealth: Payer: Self-pay | Admitting: Family Medicine

## 2020-06-20 NOTE — Telephone Encounter (Signed)
   Telephone encounter was:  Successful.  06/20/2020 Name: [name] MRN: [id number] DOB: 19-Oct-1945  [name] is a 75 y.o. year old male who is a primary care patient of [place], Naaman Plummer, [place] . The community resource team was consulted for assistance with Transportation Needs   Care guide performed the following interventions: Follow up call placed to community resources to determine status of patients referral [name] at [employer] the following:I was able to get in touch with [name] with the help of one of my colleagues [name] and he has been enrolled in our program and his transportation has been arranged for August 16, 2020. Forwarded confirmation to [employer]  .[name]  Follow Up Plan:  No further follow up planned at this time. The patient has been provided with needed resources.  [name], AAS Paralegal, [place] . Embedded Care Coordination [facility]  Care Management  [address], [place] [address] ??[email or web address]  ?? [phone]   [email or web address]

## 2020-06-20 NOTE — Telephone Encounter (Signed)
   Telephone encounter was:  Successful.  06/20/2020 Name: [name] MRN: [id number] DOB: 08/28/1945  [name] is a 75 y.o. year old male who is a primary care patient of [place], Naaman Plummer, [place] . The community resource team was consulted for assistance with Transportation Needs   Care guide performed the following interventions: Follow up call placed to the patient to discuss status of referral Spoke with [name] at [employer]. He has called and left messages for the patient to return the call to complete his enrollment.  Spoke with patient gave him the number to call to complete his enrollement and set-up transportation for his 08/16/20 appointment. Patient stated he understood and would call them today..  Follow Up Plan:  Care guide will follow up with patient by phone over the next 7 days  [name], AAS Paralegal, [place] . Embedded Care Coordination [facility]  Care Management  [address], [place] [address] ??[email or web address]  ?? [phone]   [email or web address]

## 2020-06-20 NOTE — Telephone Encounter (Signed)
   KALETH KOY DOB: 1945/09/23 MRN: [id number]   RIDER WAIVER AND RELEASE OF LIABILITY  For purposes of improving physical access to our facilities, [facility] is pleased to partner with third parties to provide [facility] patients or other authorized individuals the option of convenient, on-demand ground transportation services (the [place]") through use of the technology service that enables users to request on-demand ground transportation from independent third-party providers.  By opting to use and accept these [employer], I, the undersigned, hereby agree on behalf of myself, and on behalf of any minor child using the [employer] for whom I am the parent or legal guardian, as follows:  1. Government social research officer provided to me are provided by independent third-party transportation providers who are not [employer] or employees and who are unaffiliated with [employer]. 2. [facility] is neither a transportation carrier nor a common or public carrier. 3. [facility] has no control over the quality or safety of the transportation that occurs as a result of the [employer]. 4. [facility] cannot guarantee that any third-party transportation provider will complete any arranged transportation service. 5. [facility] makes no representation, warranty, or guarantee regarding the reliability, timeliness, quality, safety, suitability, or availability of any of the Transport Services or that they will be error free. 6. I fully understand that traveling by vehicle involves risks and dangers of serious bodily injury, including permanent disability, paralysis, and death. I agree, on behalf of myself and on behalf of any minor child using the Transport Services for whom I am the parent or legal guardian, that the entire risk arising out of my use of the [employer] remains solely with me, to the maximum extent permitted under applicable law. 7. The [employer] are provided "as is" and "as available." [facility] disclaims all representations and warranties, express, implied or statutory, not expressly set out in these terms, including the implied warranties of merchantability and fitness for a particular purpose. 8. I hereby waive and release [facility], its agents, employees, officers, directors, representatives, insurers, attorneys, assigns, successors, subsidiaries, and affiliates from any and all past, present, or future claims, demands, liabilities, actions, causes of action, or suits of any kind directly or indirectly arising from acceptance and use of the [employer]. 9. I further waive and release [place] and its affiliates from all present and future liability and responsibility for any injury or death to persons or damages to property caused by or related to the use of the [employer]. 10. I have read this Waiver and Release of Liability, and I understand the terms used in it and their legal significance. This Waiver is freely and voluntarily given with the understanding that my right (as well as the right of any minor child for whom I am the parent or legal guardian using the [employer]) to legal recourse against  in connection with the [employer] is knowingly surrendered in return for use of these services.   I attest that I read the consent document to [name], gave Mr. Poke the opportunity to ask questions and answered the questions asked (if any). I affirm that [name] then provided consent for he's participation in this program.     Legrand Pitts

## 2020-06-20 NOTE — Telephone Encounter (Signed)
  Care Management   Outreach Note  06/20/2020 Name: [name] MRN: [id number] DOB: 05-24-1946  Referred by: [name], DO Reason for referral : Chronic Care Management (COPD  Transportation)   [name] is enrolled in a Managed [place]: No  An unsuccessful telephone outreach was attempted today. The patient was referred to the case management team for assistance with care management and care coordination.   Follow Up Plan: A HIPAA compliant phone message was left for the patient providing contact information and requesting a return call.  The care management team will reach out to the patient again over the next 7-14 days.   Lazaro Arms RN, BSN, [facility] Care Management Coordinator [place] Phone: [phone] I Fax: [phone]

## 2020-06-21 NOTE — Telephone Encounter (Signed)
Called spoke to pt rescheduled for 06/28/2020

## 2020-06-28 ENCOUNTER — Ambulatory Visit: Payer: Medicare Other

## 2020-06-28 NOTE — Patient Instructions (Signed)
Visit Information  Goals Addressed              This Visit's Progress   .  I can't walk far without having to stop. (pt-stated)        Timeframe:  Long-Range Goal Priority:  High Start Date:   9/921                          Expected End Date:  09/27/20                      Patient Goals/Self Care Activities:  . Patient verbalizes understanding of plan .  Self-administers medications as prescribed . Calls pharmacy for medication refills . Call's provider office for new concerns or questions . Practice and use pursed lip breathing for shortness of breath recovery and prevention . Patient will try to cut down on his smoking . Follow up on information given him for medicaid and call care guides to help with transportation        The patient verbalized understanding of instructions, educational materials, and care plan provided today and declined offer to receive copy of patient instructions, educational materials, and care plan.   Follow up Plan: Patient would like continued follow-up.  CCM RNCM will outreach the patient within the next 30 days.. Patient will call office if needed prior to next encounter  Lazaro Arms RN, BSN, [facility] Care Management Coordinator [place] Phone: [phone] I Fax: [phone]

## 2020-06-28 NOTE — Chronic Care Management (AMB) (Signed)
Care Management    RN Visit Note  06/28/2020 Name: [name] MRN: [id number] DOB: 1945/06/12  Subjective: [name] is a 75 y.o. year old male who is a primary care patient of [place], Naaman Plummer, DO. The care management team was consulted for assistance with disease management and care coordination needs.    Engaged with patient by telephone for follow up visit in response to provider referral for case management and/or care coordination services.   Consent to Services:   [name] was given information about Care Management services today including:  1. Care Management services includes personalized support from designated clinical staff supervised by his physician, including individualized plan of care and coordination with other care providers 2. 24/7 contact phone numbers for assistance for urgent and routine care needs. 3. The patient may stop case management services at any time by phone call to the office staff.  Patient agreed to services and consent obtained.    Assessment: Patient continues to experience difficulty with attending follow  up appointments.  .. See Care Plan below for interventions and patient self-care actives. Follow up Plan: Patient would like continued follow-up.  CCM RNCM  will outreach the patient within the next 30 days.. Patient will call office if needed prior to next encounter Review of patient past medical history, allergies, medications, health status, including review of consultants reports, laboratory and other test data, was performed as part of comprehensive evaluation and provision of chronic care management services.   SDOH (Social Determinants of Health) assessments and interventions performed:    Care Plan  No Known Allergies  Outpatient Encounter Medications as of 06/28/2020  Medication Sig  . aspirin EC 81 MG tablet Take 81 mg by mouth daily. Swallow whole.  . Multiple Vitamin (MULTI-VITAMIN DAILY) TABS Take by mouth.  .  NON FORMULARY Take 2 tablets by mouth daily. Patient states that he takes Bladder release that he purchases online   No facility-administered encounter medications on file as of 06/28/2020.    Patient Active Problem List   Diagnosis Date Noted  . Acute bronchitis with COPD ([place]) 01/29/2020  . Skin lesions 01/29/2020  . 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 Hx of bacterial endocarditis 01/29/2020  . Frequent falls 01/29/2020  . H/O mitral valve replacement 01/29/2020    Conditions to be addressed/monitored: COPD  Care Plan : RN Care Manager  Updates made by Lazaro Arms, RN since 06/28/2020 12:00 AM  Problem: COPD   Goal: COPD Management   Start Date: 04/19/2020  Expected End Date: 05/31/2020  Current Barriers:   Knowledge deficit related to basic COPD self care/management- the patient stated that he just moved here and he lives with his son and his girl friend.  He is unable to walk ling distances without being short of breath or doing a lot of thing with out being short of breath.  Where he lived he said he had an inhaler that was given to him for free but he lost it and does not remember the name.  He states that he currently smokes but he says smoking his tobacco pipe is cutting down for him.   Knowledge deficit related to basic understanding of how to use inhalers and how inhaled medications work  Knowledge deficit related to importance of energy conservation   Nurse Case Manager Clinical Goal(s):  Over the next 30 days patient will report utilizing pursed lip breathing for shortness of breath  Over the next 30 days, patient will be  able to verbalize understanding of COPD action plan and when to seek appropriate levels of medical care   Interventions:   Provided patient with basic verbal COPD education on self care/management/and exacerbation prevention   Provided patient with COPD action plan and reinforced importance of daily self assessment  Will mail the patient  written  instructions on pursed lip breathing and COPD-   Discussed with the patient on smoking cessation and how it affects his breathing   Spoke with the patient again today and he has transportation set up.   It was explained to him how to call transportation for his appointments.  He knows that he has an appointment with [facility] dermatology on 08/16/20 @ 11 am [place].   Patient Goals/Self Care Activities:  . Patient verbalizes understanding of plan .  Self-administers medications as prescribed . Calls pharmacy for medication refills . Call's provider office for new concerns or questions . Practice and use pursed lip breathing for shortness of breath recovery and prevention . Patient will try to cut down on his smoking . Patient will call transportation for appointments . Patient keep all appointments made      Lazaro Arms RN, BSN, [facility] Care Management Coordinator [place] Phone: [phone] I Fax: [phone]

## 2020-07-29 ENCOUNTER — Telehealth: Payer: Medicare Other

## 2020-07-30 ENCOUNTER — Ambulatory Visit: Payer: Medicare Other

## 2020-07-30 NOTE — Chronic Care Management (AMB) (Signed)
Care Management    RN Visit Note  07/30/2020 Name: [name] MRN: [id number] DOB: 21-Oct-1945  Subjective: [name] is a 75 y.o. year old male who is a primary care patient of [place], Naaman Plummer, DO. The care management team was consulted for assistance with disease management and care coordination needs.    Engaged with patient by telephone for follow up visit in response to provider referral for case management and/or care coordination services.   Consent to Services:   [name] was given information about Care Management services today including:  1. Care Management services includes personalized support from designated clinical staff supervised by his physician, including individualized plan of care and coordination with other care providers 2. 24/7 contact phone numbers for assistance for urgent and routine care needs. 3. The patient may stop case management services at any time by phone call to the office staff.  Patient agreed to services and consent obtained.    Assessment: Patient reports that he is making progress with cutting down on his smoking.. See Care Plan below for interventions and patient self-care actives. Follow up Plan: Patient would like continued follow-up.  CCM RNCM will outreach the patient within the next 30 days.. Patient will call office if needed prior to next encounter Review of patient past medical history, allergies, medications, health status, including review of consultants reports, laboratory and other test data, was performed as part of comprehensive evaluation and provision of chronic care management services.   SDOH (Social Determinants of Health) assessments and interventions performed:    Care Plan  No Known Allergies  Outpatient Encounter Medications as of 07/30/2020  Medication Sig  . aspirin EC 81 MG tablet Take 81 mg by mouth daily. Swallow whole.  . Multiple Vitamin (MULTI-VITAMIN DAILY) TABS Take by mouth.  . NON FORMULARY  Take 2 tablets by mouth daily. Patient states that he takes Bladder release that he purchases online   No facility-administered encounter medications on file as of 07/30/2020.    Patient Active Problem List   Diagnosis Date Noted  . Acute bronchitis with COPD ([place]) 01/29/2020  . Skin lesions 01/29/2020  . 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 Hx of bacterial endocarditis 01/29/2020  . Frequent falls 01/29/2020  . H/O mitral valve replacement 01/29/2020    Conditions to be addressed/monitored: COPD  Care Plan : RN Care Manager  Updates made by Lazaro Arms, RN since 07/30/2020 12:00 AM  Problem: COPD   Goal: COPD Management   Start Date: 04/19/2020  Expected End Date: 05/31/2020  Current Barriers:   Knowledge deficit related to basic COPD self care/management- the patient stated that he just moved here and he lives with his son and his girl friend.  He is unable to walk ling distances without being short of breath or doing a lot of thing with out being short of breath.  Where he lived he said he had an inhaler that was given to him for free but he lost it and does not remember the name.  He states that he currently smokes but he says smoking his tobacco pipe is cutting down for him.   Knowledge deficit related to basic understanding of how to use inhalers and how inhaled medications work  Knowledge deficit related to importance of energy conservation   Nurse Case Manager Clinical Goal(s):  Over the next 30 days patient will report utilizing pursed lip breathing for shortness of breath  Over the next 30 days, patient will be able  to verbalize understanding of COPD action plan and when to seek appropriate levels of medical care   Interventions:   Provided patient with basic verbal COPD education on self care/management/and exacerbation prevention   Provided patient with COPD action plan and reinforced importance of daily self assessment  Will mail the patient  written instructions on pursed  lip breathing and COPD-   Discussed with the patient on smoking cessation and how it affects his breathing.  Patient report that he has stopped about 75% of his smoking he is smoking a pipe that he does before 9 pm and nothing after that.  Spoke with the patient again today and he has transportation set up.   It was explained to him how to call transportation for his appointments.  He knows that he has an appointment with [facility] dermatology on 08/16/20 @ 11 am [place]. He plans to keep his appointment.  Patient states he is breathing better he is able to do chores around the home.  He has not had any falls and not having any pain at the moment.   Patient Goals/Self Care Activities:  . Patient verbalizes understanding of plan .  Self-administers medications as prescribed . Calls pharmacy for medication refills . Call's provider office for new concerns or questions . Practice and use pursed lip breathing for shortness of breath recovery and prevention . Patient will try to cut down on his smoking . Patient will call transportation for appointments . Patient keep all appointments made      Lazaro Arms RN, BSN, [facility] Care Management Coordinator [place] Phone: [phone] I Fax: [phone]

## 2020-07-30 NOTE — Patient Instructions (Signed)
Visit Information  [name]  it was nice speaking with you. Please call me directly [phone] if you have questions about the goals we discussed.  Goals Addressed              This Visit's Progress   .  I can't walk far without having to stop. (pt-stated)        Timeframe:  Long-Range Goal Priority:  High Start Date:   9/921                          Expected End Date:  10/29/20                  Patient Goals/Self Care Activities:  . Patient verbalizes understanding of plan .  Self-administers medications as prescribed . Calls pharmacy for medication refills . Call's provider office for new concerns or questions . Practice and use pursed lip breathing for shortness of breath recovery and prevention . Patient will try to cut down on his smoking . Follow up on information given him for medicaid and call care guides to help with transportation        The patient verbalized understanding of instructions, educational materials, and care plan provided today and declined offer to receive copy of patient instructions, educational materials, and care plan.   Follow up Plan: Patient would like continued follow-up.  CCM RNCM will outreach to the patient within the next 30 days.. Patient will call office if needed prior to next encounter  Lazaro Arms, RN

## 2020-08-27 ENCOUNTER — Ambulatory Visit: Payer: Medicare Other

## 2020-08-28 NOTE — Patient Instructions (Signed)
Visit Information  Mr. [place]eby  it was nice speaking with you. Please call me directly [phone] if you have questions about the goals we discussed.  Goals Addressed              This Visit's Progress   .  I can't walk far without having to stop. (pt-stated)        Timeframe:  Long-Range Goal Priority:  High Start Date:   9/921                          Expected End Date: 10/29/20   Patient Goals/Self Care Activities:  . Patient verbalizes understanding of plan .  Self-administers medications as prescribed . Calls pharmacy for medication refills . Call's provider office for new concerns or questions . Practice and use pursed lip breathing for shortness of breath recovery and prevention . Patient will try to cut down on his smoking . Follow up on information given him for medicaid and call care guides to help with transportation        The patient verbalized understanding of instructions, educational materials, and care plan provided today and declined offer to receive copy of patient instructions, educational materials, and care plan.   Follow up Plan: Patient would like continued follow-up.  CCM RNCM will outreach the patient within the next 30 days.  Patient will call office if needed prior to next encounter  Lazaro Arms, RN  [phone]

## 2020-08-28 NOTE — Chronic Care Management (AMB) (Signed)
Care Management    RN Visit Note  08/28/2020 Name: [name] MRN: [id number] DOB: 1946-01-09  Subjective: [name] is a 75 y.o. year old male who is a primary care patient of [place], Naaman Plummer, DO. The care management team was consulted for assistance with disease management and care coordination needs.    Engaged with patient by telephone for follow up visit in response to provider referral for case management and/or care coordination services.   Consent to Services:   Mr. Charter was given information about Care Management services  including:  1. Care Management services includes personalized support from designated clinical staff supervised by his physician, including individualized plan of care and coordination with other care providers 2. 24/7 contact phone numbers for assistance for urgent and routine care needs. 3. The patient may stop case management services at any time by phone call to the office staff.  Patient agreed to services and consent obtained.    Assessment: Patient has made some progress by atteding his appointment with dermatology.  He states that he has cut down on smoking and is willing to consider smoking cessation.. See Care Plan below for interventions and patient self-care actives. Follow up Plan: Patient would like continued follow-up.  CCM RNCN will outreach the patient within the next 30 days.  Patient will call office if needed prior to next encounter  Review of patient past medical history, allergies, medications, health status, including review of consultants reports, laboratory and other test data, was performed as part of comprehensive evaluation and provision of chronic care management services.   SDOH (Social Determinants of Health) assessments and interventions performed:    Care Plan  No Known Allergies  Outpatient Encounter Medications as of 08/27/2020  Medication Sig  . aspirin EC 81 MG tablet Take 81 mg by mouth daily. Swallow  whole.  . Multiple Vitamin (MULTI-VITAMIN DAILY) TABS Take by mouth.  . NON FORMULARY Take 2 tablets by mouth daily. Patient states that he takes Bladder release that he purchases online   No facility-administered encounter medications on file as of 08/27/2020.    Patient Active Problem List   Diagnosis Date Noted  . Acute bronchitis with COPD ([place]) 01/29/2020  . Skin lesions 01/29/2020  . 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 Hx of bacterial endocarditis 01/29/2020  . Frequent falls 01/29/2020  . H/O mitral valve replacement 01/29/2020    Conditions to be addressed/monitored: COPD  Care Plan : RN Care Manager  Updates made by Lazaro Arms, RN since 08/28/2020 12:00 AM  Problem: COPD   Priority: High  Onset Date: 02/08/2020  Goal: COPD Management   Start Date: 04/19/2020  Expected End Date: 10/29/2020  Current Barriers:   Knowledge deficit related to basic COPD self care/management- the patient stated that he just moved here and he lives with his son and his girl friend.  He is unable to walk ling distances without being short of breath or doing a lot of thing with out being short of breath.  Where he lived he said he had an inhaler that was given to him for free but he lost it and does not remember the name.  He states that he currently smokes but he says smoking his tobacco pipe is cutting down for him.   Knowledge deficit related to basic understanding of how to use inhalers and how inhaled medications work  Knowledge deficit related to importance of energy conservation   Nurse Case Manager Clinical Goal(s):  Over the  next 30 days patient will report utilizing pursed lip breathing for shortness of breath  Over the next 30 days, patient will be able to verbalize understanding of COPD action plan and when to seek appropriate levels of medical care   Interventions:   Provided patient with basic verbal COPD education on self care/management/and exacerbation prevention   Provided patient  with COPD action plan and reinforced importance of daily self assessment  Will mail the patient  written instructions on pursed lip breathing and COPD-   Discussed with the patient on smoking cessation and how it affects his breathing.  He states that he breathing has been fine. Patient reports that he is still smoking his pipe and 3-4 cigarettes a day.  Advised the patient to call the office and schedule and appointment for a check up and possible smoking cessation.  Spoke with the patient  today and he went to his appointment on 08/16/20 with Dermatology.  He reports that they will be scheduling him an appointment at the hospital.  He states that he does not know the date and time yet.  We discussed his transportation.  He has the number to call for Transportation if needed for his next appointment. He was advised that if he schedules transportation and decides to use another mode of travel he needs to call and cancel the ride.  He verbalized understanding.    Patient Goals/Self Care Activities:  . Patient verbalizes understanding of plan .  Self-administers medications as prescribed . Calls pharmacy for medication refills . Call's provider office for new concerns or questions . Practice and use pursed lip breathing for shortness of breath recovery and prevention . Patient will try to cut down on his smoking . Patient will call transportation for appointments . Patient keep all appointments made      Lazaro Arms RN, BSN, [facility] Care Management Coordinator [place] Phone: [phone] Fax: [phone]

## 2020-09-02 NOTE — Progress Notes (Signed)
Histology and Location of Primary Skin Cancer:    [name] presented last month with the following signs/symptoms: lesions on his left forearm and left hand that had been present for some time. Patient presented to dermatologist's office who recommended biopsy followed by definitive radiation  Past/Anticipated interventions by patient's surgeon/dermatologist for current problematic lesion, if any: 08/16/2020 --Dr. Jamse Belfast    Past skin cancers, if any:  Patient denies  History of Blistering sunburns, if any: Patient denies   SAFETY ISSUES:  Prior radiation? No  Pacemaker/ICD? No  Possible current pregnancy? N/A  Is the patient on methotrexate? No  Current Complaints / other details:  Patient is a current every day smoker (states he smokes a pipe in the morning, and cigarettes the rest of the day). Only received the first Pfiver vaccine

## 2020-09-03 ENCOUNTER — Other Ambulatory Visit: Payer: Self-pay

## 2020-09-03 ENCOUNTER — Encounter: Payer: Self-pay | Admitting: Radiation Oncology

## 2020-09-03 ENCOUNTER — Ambulatory Visit
Admission: RE | Admit: 2020-09-03 | Discharge: 2020-09-03 | Disposition: A | Discharge status: Home / Self Care | Payer: Medicare Other | Source: Ambulatory Visit | Attending: Radiation Oncology | Admitting: Radiation Oncology

## 2020-09-03 ENCOUNTER — Telehealth: Payer: Self-pay | Admitting: *Deleted

## 2020-09-03 VITALS — BP 115/77 | HR 130 | Temp 97.0°F | Resp 20 | Ht 72.0 in | Wt 165.4 lb

## 2020-09-03 DIAGNOSIS — C44609 Unspecified malignant neoplasm of skin of left upper limb, including shoulder: Secondary | ICD-10-CM

## 2020-09-03 DIAGNOSIS — C44619 Basal cell carcinoma of skin of left upper limb, including shoulder: Secondary | ICD-10-CM

## 2020-09-03 DIAGNOSIS — Z7982 Long term (current) use of aspirin: Secondary | ICD-10-CM | POA: Insufficient documentation

## 2020-09-03 DIAGNOSIS — F17210 Nicotine dependence, cigarettes, uncomplicated: Secondary | ICD-10-CM | POA: Insufficient documentation

## 2020-09-03 DIAGNOSIS — Z79899 Other long term (current) drug therapy: Secondary | ICD-10-CM | POA: Diagnosis not present

## 2020-09-03 NOTE — Telephone Encounter (Signed)
CALLED PATIENT TO INFORM OF APPT. WITH THE WOUND CENTER @ [place] - APPT.  DATE 09-12-20- ARRIVAL TIME- 2:30 PM, SPOKE WITH PATIENT AND HE IS AWARE OF THIS APPT.

## 2020-09-03 NOTE — Progress Notes (Addendum)
Radiation Oncology         [phone] ________________________________  Initial Outpatient Consultation  Name: [name] MRN: [id number]  Date: 09/03/2020  DOB: 1946-04-14  HE:NIDPO-EUMP, Naaman Plummer, DO  Ulla Gallo, MD   REFERRING PHYSICIAN: Ulla Gallo, MD  DIAGNOSIS:    ICD-10-CM   1. Basal cell carcinoma, arm, left  C44.619 Ambulatory referral to Social Work  2. Basal cell carcinoma (BCC) of left hand  C44.619 Ambulatory referral to Social Work  3. Basal cell carcinoma (BCC) of skin of left wrist  C44.619    At least cT3N0 Basal cell carcinoma, skin   CHIEF COMPLAINT: Here to discuss management of skin cancer  HISTORY OF PRESENT ILLNESS::[name] is a 75 y.o. male who presented with lesions on left forearm and left hand.  Subsequently, the patient was seen by Dr. Jamse Belfast, dermatologist, who performed biopsies. Biopsy of the left mid central posterior forearm revealed basal cell carcinoma, nodular and infiltrative pattens, with peripheral and deep marginal involvement. Biopsy of the left mid radial dorsal hand revealed basal cell carcinoma, nodular pattern, with base involvement.  Dr. Martin Majestic recommended consideration of definitive radiation.  He was not deemed a satisfactory surgical candidate.  The patient has a longstanding history of smoking.  He smokes a pipe in the morning and cigarettes for the rest of the day.  He does not demonstrate any motivation to quit.  He denies history of previous skin cancers or blistering sunburns.  PREVIOUS RADIATION THERAPY: No  PAST MEDICAL HISTORY:  has no past medical history on file.    PAST SURGICAL HISTORY:History reviewed. No pertinent surgical history.  FAMILY HISTORY: family history is not on file.  SOCIAL HISTORY:  reports that he has been smoking cigarettes and pipe. He has smoked for the past 62.00 years. He has never used smokeless tobacco. He reports previous alcohol use. He reports previous drug  use.  ALLERGIES: Orange juice [orange oil]  MEDICATIONS:  Current Outpatient Medications  Medication Sig Dispense Refill  . aspirin EC 81 MG tablet Take 81 mg by mouth daily. Swallow whole.    . furosemide (LASIX) 40 MG tablet Take 40 mg by mouth daily.    . Multiple Vitamin (MULTI-VITAMIN DAILY) TABS Take by mouth.    . NON FORMULARY Take 2 tablets by mouth daily. Patient states that he takes Bladder release that he purchases online     No current facility-administered medications for this encounter.    REVIEW OF SYSTEMS:  Notable for that above.   PHYSICAL EXAM:  height is 6' (1.829 m) and weight is 165 lb 6 oz (75 kg). His temporal temperature is 97 F (36.1 C) (abnormal). His blood pressure is 115/77 and his pulse is 130 (abnormal). His respiration is 20 and oxygen saturation is 100%.   General: Alert and oriented, in no acute distress   Skin: See photographs below.  He has a large ulcerative mass with raised borders, spanning the left wrist and left hand.   (This has a black surface and patient states that he recently put pepper on it ) There is also a moist raised large lesion with active bleeding on the left forearm Neurologic: He is ambulatory.  He stutters.  He is alert and oriented.   Psychiatric: Judgment and insight are intact. Affect is appropriate. Lymph: No palpable nodes in the antecubital region, left          ECOG = 1  0 - Asymptomatic (Fully active, able  to carry on all predisease activities without restriction)  1 - Symptomatic but completely ambulatory (Restricted in physically strenuous activity but ambulatory and able to carry out work of a light or sedentary nature. For example, light housework, office work)  2 - Symptomatic, <50% in bed during the day (Ambulatory and capable of all self care but unable to carry out any work activities. Up and about more than 50% of waking hours)  3 - Symptomatic, >50% in bed, but not bedbound (Capable of only limited  self-care, confined to bed or chair 50% or more of waking hours)  4 - Bedbound (Completely disabled. Cannot carry on any self-care. Totally confined to bed or chair)  5 - Death   Eustace Pen MM, Creech RH, Tormey DC, et al. [phone]). "Toxicity and response criteria of the [facility]". [place] Oncol. 5 (6): 649-55   LABORATORY DATA:  Lab Results  Component Value Date   WBC 4.9 01/29/2020   HGB 11.2 (L) 01/29/2020   HCT 34.7 (L) 01/29/2020   MCV 87 01/29/2020   PLT 103 (L) 01/29/2020   CMP     Component Value Date/Time   NA 139 01/29/2020 1548   K 4.5 01/29/2020 1548   CL 104 01/29/2020 1548   CO2 25 01/29/2020 1548   GLUCOSE 101 (H) 01/29/2020 1548   BUN 34 (H) 01/29/2020 1548   CREATININE 1.06 01/29/2020 1548   CALCIUM 9.0 01/29/2020 1548   PROT 7.3 01/29/2020 1548   ALBUMIN 3.9 01/29/2020 1548   AST 22 01/29/2020 1548   ALT 17 01/29/2020 1548   ALKPHOS 93 01/29/2020 1548   BILITOT 0.4 01/29/2020 1548   GFRNONAA 68 01/29/2020 1548   GFRAA 79 01/29/2020 1548         RADIOGRAPHY: No results found.    IMPRESSION/PLAN: Skin cancer  Today, I talked to the patient about the findings and work-up thus far. We discussed the patient's diagnosis of advanced basal cell carcinoma of the left upper extremity and general treatment for this, highlighting the role of radiotherapy in the management. We discussed the available radiation techniques, and focused on the details of logistics and delivery.    Unfortunately these cancers are in high risk areas for radiation therapy.  However, he is not a good surgical candidate.   The patient continues to smoke.  He denies any motivation to quit.  He understands that continuing to smoke can compromise his ability to heal from radiation therapy.  I will refer him to wound care as he is at very high risk for nonhealing chronic wounds regardless of whether he undergoes treatment or not.  He already has terrible wounds from  the skin cancer and if these respond to radiation therapy he will be at risk for nonhealing wounds and possible bone exposure moving forward.  He understands he has the option of doing nothing, but he does want to treat his cancer.  Therefore I do think it is reasonable to proceed.  I recommend 6 weeks of radiation therapy.  He would need to come in 5 days a week for 6 weeks.  Anticipate needing IMRT which is medically necessary in order to spare his normal tissues (bone, lymphatics) for the wrist lesion and electrons to treat the forearm lesion.  We discussed the risks, benefits, and side effects of radiotherapy. Side effects may include but not necessarily be limited to: Skin irritation, bleeding, fatigue, hair loss, lymphedema, infection, nonhealing wound, bone exposure, bone injury, serious injury requiring  a major surgery; no guarantees of treatment were given. A consent form was signed and placed in the patient's medical record.  The patient was encouraged to ask questions that I answered to the best of my ability.   Wound care referral made.  We will schedule him for treatment planning in the meantime.  Patient knows to call me if he changes his mind about smoking and would like help with quitting.   ADDENDUM - I spoke w Dr Martin Majestic about the significant risks of RT and stated that IF surgery is a viable option, I hope that can be explored. We will hold the RT planning appt and she is going to check w/ some MOHS specialists to see if his disease is resectable.  She will let us know whether to proceed w/ RT or hold appts.  On date of service, in total, I spent 50 minutes on this encounter. Patient was seen in person.   __________________________________________   Eppie Gibson, MD  This document serves as a record of services personally performed by Eppie Gibson, MD. It was created on his behalf by Clerance Lav, a trained medical scribe. The creation of this record is based on the scribe's  personal observations and the provider's statements to them. This document has been checked and approved by the attending provider.

## 2020-09-04 ENCOUNTER — Encounter: Payer: Self-pay | Admitting: Radiation Oncology

## 2020-09-04 DIAGNOSIS — C44619 Basal cell carcinoma of skin of left upper limb, including shoulder: Secondary | ICD-10-CM | POA: Insufficient documentation

## 2020-09-04 DIAGNOSIS — C44609 Unspecified malignant neoplasm of skin of left upper limb, including shoulder: Secondary | ICD-10-CM | POA: Insufficient documentation

## 2020-09-04 DIAGNOSIS — C4491 Basal cell carcinoma of skin, unspecified: Secondary | ICD-10-CM | POA: Insufficient documentation

## 2020-09-05 ENCOUNTER — Encounter: Payer: Self-pay | Admitting: Licensed Clinical Social Worker

## 2020-09-05 NOTE — Progress Notes (Signed)
[place] Work  Clinical Social Work was referred by rad onc for assessment of psychosocial needs.  Clinical Social Worker contacted patient by phone  to offer support and assess for needs.  Son, Aaron Edelman, answered but placed patient on the phone to discuss transportation and other needs. Patient already connected with [facility] transportation through RN care coordinator with Family Medicine. Confirmed that he has the correct number to contact them for rides to appointments. Patient denied other needs at this time, but agreed to contact CSW should any needs arise.      [place], [place] Worker [employer]

## 2020-09-09 NOTE — Addendum Note (Signed)
Encounter addended by: Eppie Gibson, MD on: 09/09/2020 9:35 AM  Actions taken: Clinical Note Signed

## 2020-09-12 ENCOUNTER — Encounter (HOSPITAL_BASED_OUTPATIENT_CLINIC_OR_DEPARTMENT_OTHER): Payer: Medicare Other | Attending: Internal Medicine | Admitting: Internal Medicine

## 2020-09-12 ENCOUNTER — Other Ambulatory Visit: Payer: Self-pay

## 2020-09-12 DIAGNOSIS — J438 Other emphysema: Secondary | ICD-10-CM | POA: Diagnosis not present

## 2020-09-12 DIAGNOSIS — Z952 Presence of prosthetic heart valve: Secondary | ICD-10-CM | POA: Insufficient documentation

## 2020-09-12 DIAGNOSIS — L98499 Non-pressure chronic ulcer of skin of other sites with unspecified severity: Secondary | ICD-10-CM | POA: Insufficient documentation

## 2020-09-12 DIAGNOSIS — F17200 Nicotine dependence, unspecified, uncomplicated: Secondary | ICD-10-CM | POA: Diagnosis not present

## 2020-09-12 NOTE — Progress Notes (Signed)
[name], [name] ([id number]) Visit Report for 09/12/2020 Chief Complaint Document Details Patient Name: Date of Service: PA [name] 09/12/2020 2:45 PM Medical Record Number: [id number] Patient Account Number: [id number] Date of Birth/Sex: Treating RN: [birth year] (75 y.o. Male) [name] Primary Care Provider: Erlinda Thomas, SIMO NE Other Clinician: Referring Provider: Treating Provider/Extender: [name], SIMO NE Weeks in Treatment: 0 Information Obtained from: Patient Chief Complaint 09/12/2020; patient is sent here by Dr. Lanell Thomas for review of malignant wounds on the left arm and hand Electronic Signature(s) Signed: 09/12/2020 5:23:41 PM By: Shawn Ham MD Entered By: [name] on 09/12/2020 15:57:23 -------------------------------------------------------------------------------- HPI Details Patient Name: Date of Service: PA [name]. 09/12/2020 2:45 PM Medical Record Number: [id number] Patient Account Number: [id number] Date of Birth/Sex: Treating RN: 08/20/1945 (74 y.o. Male) [name] Primary Care Provider: Erlinda Thomas, SIMO NE Other Clinician: Referring Provider: Treating Provider/Extender: [name], SIMO NE Weeks in Treatment: 0 History of Present Illness HPI Description: ADMISSION 09/12/2020 This is a 75 year old man who was sent here by Dr. Lanell Thomas for review of malignant wounds on the left forearm and left hand. The patient tells me that the area on his left hand may have been there for as long as 7 years but the area on the forearm just noticed a year ago. It is actually the area on the forearm that is most painful. He is already been seen by Dr. Martin Thomas of dermatology who performed biopsies. 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 It was not felt that he was a good candidate for surgical treatment of this. He was referred to Dr. Lanell Thomas for consideration of radiation. Dr. Lanell Thomas noted significant risks of radiotherapy in her note. I believe she talk to Dr. Martin Thomas again about whether there is a possibility of surgical resection The patient tells me that he has not noted any difficulties in his hand or wrist. He does have pain in the dorsal arm area which she treats with either alcohol or peroxide. He is not putting any specific dressings on these areas. 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 He is a continued smoker. It would appear that he also has a history of coronary artery disease although I cannot really did get a history of chest pain out of him. It would appear that he also had endocarditis in 2018 so it is likely that the mitral valve replacement had to be done related to this although I do not have any specific information Electronic Signature(s) Signed: 09/12/2020 5:23:41 PM By: Shawn Ham MD Entered By: [name] on 09/12/2020 16:01:57 -------------------------------------------------------------------------------- Physical Exam Details Patient Name: Date of Service: PA [name]. 09/12/2020 2:45 PM Medical Record Number: [id number] Patient Account Number: [id number] Date of Birth/Sex: Treating RN: [birth year] (74 y.o. Male) [name] Primary Care Provider: Erlinda Thomas, SIMO NE Other Clinician: Referring Provider: Treating Provider/Extender: [name], SIMO NE Weeks in Treatment: 0 Constitutional Sitting or standing Blood Pressure is within target range for patient.. The patient is tachycardic at 130 regular. Respirations regular, non-labored and within target range.Darene Lamer emperature is  normal and within  the target range for the patient.[name] Appears in no distress. Respiratory work of breathing is normal. Hyperresonant to percussion. Breath sounds are normal no wheezing.. Cardiovascular No murmurs JVP not elevated. 2 out of 6 midsystolic ejection murmur.[name] Lymphatic None palpable at the epitrochlear axilla supra and infra clavicular areas. Psychiatric The patient seems able to make decisions. Notes Wound exam; the areas in question are on the dorsal left forearm. This is hyper granulated. There is no subcutaneous involvement. The second area is on the dorsal wrist. The area is relatively fixed probably with some subcutaneous involvement. Wounds are open in the middle of this. There is no bleeding no obvious infection. Radial pulses are palpable. Strength in his hands seems normal. Radial nerve function is normal Electronic Signature(s) Signed: 09/12/2020 5:23:41 PM By: Shawn Ham MD Entered By: [name] on 09/12/2020 16:22:18 -------------------------------------------------------------------------------- Physician Orders Details Patient Name: Date of Service: PA [name]. 09/12/2020 2:45 PM Medical Record Number: [id number] Patient Account Number: [id number] Date of Birth/Sex: Treating RN: 15-Jul-1945 (74 y.o. Male) [name] Primary Care Provider: Erlinda Thomas, SIMO NE Other Clinician: Referring Provider: Treating Provider/Extender: [name], SIMO NE Weeks in Treatment: 0 Verbal / Phone Orders: No Diagnosis Coding Follow-up Appointments Return Appointment in 2 weeks. Bathing/ Shower/ Hygiene May shower with protection but do not get wound dressing(s) wet. Wound Treatment Wound #1 - Forearm Wound Laterality: Left Cleanser: Wound Cleanser Every Other Day/15 Days Discharge Instructions: Cleanse the wound with wound cleanser prior to applying a clean dressing using gauze sponges, not tissue or cotton balls. Prim Dressing:  KerraCel Ag Gelling Fiber Dressing, 4x5 in (silver alginate) Every Other Day/15 Days ary Discharge Instructions: Apply silver alginate to wound bed as instructed Secondary Dressing: Woven Gauze Sponge, Non-Sterile 4x4 in Every Other Day/15 Days Discharge Instructions: Apply over primary dressing as directed. Secured With: Elastic Bandage 4 inch (ACE bandage) Every Other Day/15 Days Discharge Instructions: Secure with ACE bandage as directed. Secured With: Child psychotherapist, Sterile 2x75 (in/in) Every Other Day/15 Days Discharge Instructions: Secure with stretch gauze as directed. Wound #2 - Wrist Wound Laterality: Left Cleanser: Wound Cleanser Every Other Day/15 Days Discharge Instructions: Cleanse the wound with wound cleanser prior to applying a clean dressing using gauze sponges, not tissue or cotton balls. Prim Dressing: KerraCel Ag Gelling Fiber Dressing, 4x5 in (silver alginate) Every Other Day/15 Days ary Discharge Instructions: Apply silver alginate to wound bed as instructed Secondary Dressing: Woven Gauze Sponge, Non-Sterile 4x4 in Every Other Day/15 Days Discharge Instructions: Apply over primary dressing as directed. Secured With: Elastic Bandage 4 inch (ACE bandage) Every Other Day/15 Days Discharge Instructions: Secure with ACE bandage as directed. Secured With: Child psychotherapist, Sterile 2x75 (in/in) Every Other Day/15 Days Discharge Instructions: Secure with stretch gauze as directed. [employer] Electrocardiogram - Elevated heart rate Electronic Signature(s) Signed: 09/12/2020 5:23:41 PM By: Shawn Ham MD Signed: 09/12/2020 6:17:09 PM By: Levan Hurst RN, BSN Entered By: Levan Hurst on 09/12/2020 16:10:45 Prescription 09/12/2020 -------------------------------------------------------------------------------- Tollie Pizza MD Patient Name: Provider: [birth year] [id number] Date of Birth: NPI#: Male  [id number] Sex: DEA #: [phone] [id number] Phone #: License #: [place] Patient Address: [place], [place] [address] [address], [place] [address] [phone] Allergies No Known Allergies Provider's Orders Electrocardiogram - Elevated heart rate Hand Signature: Date(s): Electronic Signature(s) Signed: 09/12/2020 5:23:41 PM By: Shawn Ham MD Signed: 09/12/2020 6:17:09  PM By: Levan Hurst RN, BSN Entered By: Levan Hurst on 09/12/2020 16:10:46 -------------------------------------------------------------------------------- Problem List Details Patient Name: Date of Service: PA [name] 09/12/2020 2:45 PM Medical Record Number: [id number] Patient Account Number: [id number] Date of Birth/Sex: Treating RN: 08/30/1945 (74 y.o. Male) [name] Primary Care Provider: Erlinda Thomas, SIMO NE Other Clinician: Referring Provider: Treating Provider/Extender: [name], SIMO NE Weeks in Treatment: 0 Active Problems ICD-10 Encounter Code Description Active Date MDM Diagnosis C44.619 Basal cell carcinoma of skin of left upper limb, including shoulder 09/12/2020 No Yes J43.8 Other emphysema 09/12/2020 No Yes Inactive Problems Resolved Problems Electronic Signature(s) Signed: 09/12/2020 5:23:41 PM By: Shawn Ham MD Entered By: [name] on 09/12/2020 15:53:35 -------------------------------------------------------------------------------- Progress Note Details Patient Name: Date of Service: PA [name]. 09/12/2020 2:45 PM Medical Record Number: [id number] Patient Account Number: [id number] Date of Birth/Sex: Treating RN: December 09, 1945 (74 y.o. Male) [name] Primary Care Provider: Erlinda Thomas, SIMO NE Other Clinician: Referring Provider: Treating Provider/Extender: [name], SIMO NE Weeks in Treatment: 0 Subjective Chief  Complaint Information obtained from Patient 09/12/2020; patient is sent here by Dr. Lanell Thomas for review of malignant wounds on the left arm and hand History of Present Illness (HPI) ADMISSION 09/12/2020 This is a 74 year old man who was sent here by Dr. Lanell Thomas for review of malignant wounds on the left forearm and left hand. The patient tells me that the area on his left hand may have been there for as long as 7 years but the area on the forearm just noticed a year ago. It is actually the area on the forearm that is most painful. He is already been seen by Dr. Martin Thomas of dermatology who performed biopsies. 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 It was not felt that he was a good candidate for surgical treatment of this. He was referred to Dr. Lanell Thomas for consideration of radiation. Dr. Lanell Thomas noted significant risks of radiotherapy in her note. I believe she talk to Dr. Martin Thomas again about whether there is a possibility of surgical resection The patient tells me that he has not noted any difficulties in his hand or wrist. He does have pain in the dorsal arm area which she treats with either alcohol or peroxide. He is not putting any specific dressings on these areas. Past medical history this is all partially via the patient however it would appear that he had mitral valve replacement at [facility] in 2019 I think this was a bioprosthetic valve. He is a continued smoker. It would appear that he also has a history of coronary artery disease although I cannot really did get a history of chest pain out of him. It would appear that he also had endocarditis in 2018 so it is likely that the mitral valve replacement had to be done related to this although I do not have any specific information Patient  History Information obtained from Patient. Allergies No Known Allergies Family History No family history of Cancer, Diabetes, Heart Disease, Hereditary Spherocytosis, Hypertension, Kidney Disease, Lung Disease, Seizures, Stroke, Thyroid Problems, Tuberculosis. Social History Current every day smoker, Alcohol Use - Rarely, Drug Use - No History, Caffeine Use - Moderate. Medical History Respiratory Patient has history of Chronic Obstructive Pulmonary Disease (COPD) Medical A Surgical History Notes nd  Integumentary (Skin) skin cancer Oncologic Skin Cancer Review of Systems (ROS) Eyes Complains or has symptoms of Glasses / Contacts. Respiratory Complains or has symptoms of Shortness of Breath. Cardiovascular Denies complaints or symptoms of Chest pain. Gastrointestinal Denies complaints or symptoms of Frequent diarrhea, Nausea, Vomiting. Endocrine Denies complaints or symptoms of Heat/cold intolerance. Genitourinary Denies complaints or symptoms of Frequent urination. Integumentary (Skin) Complains or has symptoms of Wounds. Musculoskeletal Denies complaints or symptoms of Muscle Pain, Muscle Weakness. Neurologic Denies complaints or symptoms of Numbness/parasthesias. Psychiatric Denies complaints or symptoms of Claustrophobia, Suicidal. Objective Constitutional Sitting or standing Blood Pressure is within target range for patient.. The patient is tachycardic at 130 regular. Respirations regular, non-labored and within target range.Darene Lamer emperature is normal and within the target range for the patient.[name] Appears in no distress. Vitals Time Taken: 3:03 PM, Height: 72 in, Source: Stated, Weight: 165 lbs, Source: Stated, BMI: 22.4, Temperature: 98.7 F, Pulse: 130 bpm, Respiratory Rate: 18 breaths/min, Blood Pressure: 109/72 mmHg. Respiratory work of breathing is normal. Hyperresonant to percussion. Breath sounds are normal no wheezing.. Cardiovascular No murmurs JVP not  elevated.. Lymphatic None palpable at the epitrochlear axilla supra and infra clavicular areas. Psychiatric The patient seems able to make decisions. General Notes: Wound exam; the areas in question are on the dorsal left forearm. This is hyper granulated. There is no subcutaneous involvement. The second area is on the dorsal wrist. The area is relatively fixed probably with some subcutaneous involvement. Wounds are open in the middle of this. There is no bleeding no obvious infection. Radial pulses are palpable. Strength in his hands seems normal. Radial nerve function is normal Integumentary (Hair, Skin) Wound #1 status is Open. Original cause of wound was Other Lesion. The date acquired was: 09/03/2015. The wound is located on the Left Forearm. The wound measures 2.5cm length x 2.8cm width x 0.2cm depth; 5.498cm^2 area and 1.1cm^3 volume. There is Fat Layer (Subcutaneous Tissue) exposed. There is no tunneling or undermining noted. There is a medium amount of sanguinous drainage noted. The wound margin is distinct with the outline attached to the wound base. There is large (67-100%) red, pink granulation within the wound bed. There is no necrotic tissue within the wound bed. Wound #2 status is Open. Original cause of wound was Other Lesion. The date acquired was: 09/03/2015. The wound is located on the Left Wrist. The wound measures 1.7cm length x 4cm width x 0.3cm depth; 5.341cm^2 area and 1.602cm^3 volume. There is Fat Layer (Subcutaneous Tissue) exposed. There is no tunneling or undermining noted. There is a medium amount of sanguinous drainage noted. The wound margin is well defined and not attached to the wound base. There is medium (34-66%) red, pink granulation within the wound bed. There is a medium (34-66%) amount of necrotic tissue within the wound bed including Eschar. Assessment Active Problems ICD-10 Basal cell carcinoma of skin of left upper limb, including shoulder Other  emphysema Plan Follow-up Appointments: Return Appointment in 2 weeks. Bathing/ Shower/ Hygiene: May shower with protection but do not get wound dressing(s) wet. WOUND #1: - Forearm Wound Laterality: Left Cleanser: Wound Cleanser Every Other Day/15 Days Discharge Instructions: Cleanse the wound with wound cleanser prior to applying a clean dressing using gauze sponges, not tissue or cotton balls. Prim Dressing: KerraCel Ag Gelling Fiber Dressing, 4x5 in (silver alginate) Every Other Day/15 Days ary Discharge Instructions: Apply silver alginate to wound bed as instructed Secondary Dressing: Woven Gauze Sponge, Non-Sterile 4x4 in Every Other Day/15 Days  Discharge Instructions: Apply over primary dressing as directed. Secured With: Elastic Bandage 4 inch (ACE bandage) Every Other Day/15 Days Discharge Instructions: Secure with ACE bandage as directed. Secured With: Child psychotherapist, Sterile 2x75 (in/in) Every Other Day/15 Days Discharge Instructions: Secure with stretch gauze as directed. WOUND #2: - Wrist Wound Laterality: Left Cleanser: Wound Cleanser Every Other Day/15 Days Discharge Instructions: Cleanse the wound with wound cleanser prior to applying a clean dressing using gauze sponges, not tissue or cotton balls. Prim Dressing: KerraCel Ag Gelling Fiber Dressing, 4x5 in (silver alginate) Every Other Day/15 Days ary Discharge Instructions: Apply silver alginate to wound bed as instructed Secondary Dressing: Woven Gauze Sponge, Non-Sterile 4x4 in Every Other Day/15 Days Discharge Instructions: Apply over primary dressing as directed. Secured With: Elastic Bandage 4 inch (ACE bandage) Every Other Day/15 Days Discharge Instructions: Secure with ACE bandage as directed. Secured With: Child psychotherapist, Sterile 2x75 (in/in) Every Other Day/15 Days Discharge Instructions: Secure with stretch gauze as directed. 1. I put silver alginate gauze and an Ace wrap on  this. 2. He does live with family but I am not certain if he has enough help to put on a dressing here. 3. I have advised him not to put peroxide and alcohol on this which will only add to the discomfort 4. Fortunately nothing looks infected here. 5. I will reach out to Dr. Lanell Thomas to see if there is been a thought about sending him to the skin surgery center or perhaps another surgical alternative. Or, is the plan to go forward with radiation 6. The patient is fairly adamant that he wants to go forward with some type of treatment 7. It seems from the scan information available in epic and what I was able to determine from the patient is that he had probably a mitral valve replacement at [facility] in 2019. This could have been related to endocarditis. He arrives in clinic fairly tachycardic at 130 regular. He also has a vague history of coronary artery disease but I could not elicit any exertional symptoms. I will see if I can get an EKG. If he is consistently tachycardic I may reach out to cardiology. He does not have a primary doctor. He is likely he will need lab work as well. 8. On a social front, the patient I think lives near with family although I am not sure if they are able to help him with dressings on his own arm. He also apparently has some support from [facility] hospital for taxicab for transport. I am not exactly sure whether he would be at candidate for daily transport for example radiation 9. Suspect he has emphysema. O2 sat on room air was 98% Addendum Belva Bertin is in care everywhere. I looked at a note from 2018. 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 His surgical date was 07/04/2016. Postoperative A. fib requiring cardioversion. He was not anticoagulated. Interestingly they also noted the basal cell carcinoma on his left wrist fitting with a history that the patient gave. I also note that he had a TEE noting that he was  tachycardic throughout the study. Furthermore he had an MRI of his wrist on 08/06/2016 showed enhancing superficial mass involving the left lateral wrist at the level of the radiocarpal joint which arises from the dermis and extends into the subcutaneous fat abuts against the radial aspect of the cephalic vein tiny superficial vessels and the soft tissue in the region  of the superficial branch of the radial nerve no evidence of underlying tendinous or osseous involvement Electronic Signature(s) Signed: 09/12/2020 5:23:41 PM By: Shawn Ham MD Entered By: [name] on 09/12/2020 16:22:55 -------------------------------------------------------------------------------- HxROS Details Patient Name: Date of Service: PA [name]. 09/12/2020 2:45 PM Medical Record Number: [id number] Patient Account Number: [id number] Date of Birth/Sex: Treating RN: 04/23/1946 (75 y.o. Male) Lorrin Jackson Primary Care Provider: Erlinda Thomas, SIMO NE Other Clinician: Referring Provider: Treating Provider/Extender: [name], SIMO NE Weeks in Treatment: 0 Information Obtained From Patient Eyes Complaints and Symptoms: Positive for: Glasses / Contacts Respiratory Complaints and Symptoms: Positive for: Shortness of Breath Medical History: Positive for: Chronic Obstructive Pulmonary Disease (COPD) Cardiovascular Complaints and Symptoms: Negative for: Chest pain Gastrointestinal Complaints and Symptoms: Negative for: Frequent diarrhea; Nausea; Vomiting Endocrine Complaints and Symptoms: Negative for: Heat/cold intolerance Genitourinary Complaints and Symptoms: Negative for: Frequent urination Integumentary (Skin) Complaints and Symptoms: Positive for: Wounds Medical History: Past Medical History Notes: skin cancer Musculoskeletal Complaints and Symptoms: Negative for: Muscle Pain; Muscle Weakness Neurologic Complaints and Symptoms: Negative for:  Numbness/parasthesias Psychiatric Complaints and Symptoms: Negative for: Claustrophobia; Suicidal Immunological Oncologic Medical History: Past Medical History Notes: Skin Cancer Immunizations Pneumococcal Vaccine: Received Pneumococcal Vaccination: No Implantable Devices None Family and Social History Cancer: No; Diabetes: No; Heart Disease: No; Hereditary Spherocytosis: No; Hypertension: No; Kidney Disease: No; Lung Disease: No; Seizures: No; Stroke: No; Thyroid Problems: No; Tuberculosis: No; Current every day smoker; Alcohol Use: Rarely; Drug Use: No History; Caffeine Use: Moderate; Financial Concerns: No; Food, Clothing or Shelter Needs: No; Support System Lacking: No; Transportation Concerns: No Engineer, maintenance) Signed: 09/12/2020 5:23:41 PM By: Shawn Ham MD Signed: 09/12/2020 6:07:55 PM By: Lorrin Jackson Entered By: Lorrin Jackson on 09/12/2020 15:10:51 -------------------------------------------------------------------------------- [place] Details Patient Name: Date of Service: PA [name] 09/12/2020 Medical Record Number: [id number] Patient Account Number: [id number] Date of Birth/Sex: Treating RN: 1946-03-19 (74 y.o. Male) [name] Primary Care Provider: Erlinda Thomas, SIMO NE Other Clinician: Referring Provider: Treating Provider/Extender: [name], SIMO NE Weeks in Treatment: 0 Diagnosis Coding ICD-10 Codes Code Description R00.762 Basal cell carcinoma of skin of left upper limb, including shoulder J43.8 Other emphysema Facility Procedures CPT4 Code: [id number] Description: 99214 - WOUND CARE VISIT-LEV 4 EST PT Modifier: Quantity: 1 Physician Procedures : CPT4 Code Description Modifier [id number] - WC PHYS LEVEL 4 - NEW PT ICD-10 Diagnosis Description A76.811 Basal cell carcinoma of skin of left upper limb, including shoulder J43.8 Other emphysema Quantity: 1 Electronic Signature(s) Signed: 09/12/2020  5:23:41 PM By: Shawn Ham MD Entered By: [name] on 09/12/2020 16:10:29

## 2020-09-12 NOTE — Progress Notes (Signed)
[name], [name] ([id number]) Visit Report for 09/12/2020 Abuse/Suicide Risk Screen Details Patient Name: Date of Service: PA [name] 09/12/2020 2:45 PM Medical Record Number: [id number] Patient Account Number: [id number] Date of Birth/Sex: Treating RN: 08-11-1945 (75 y.o. Male) Lorrin Jackson Primary Care Tresia Revolorio: Erlinda Hong, SIMO NE Other Clinician: Referring Khasir Woodrome: Treating Iantha Titsworth/Extender: Larence Penning, SIMO NE Weeks in Treatment: 0 Abuse/Suicide Risk Screen Items Answer ABUSE RISK SCREEN: Has anyone close to you tried to hurt or harm you recentlyo No Do you feel uncomfortable with anyone in your familyo No Has anyone forced you do things that you didnt want to doo No Electronic Signature(s) Signed: 09/12/2020 6:07:55 PM By: Lorrin Jackson Entered By: Lorrin Jackson on 09/12/2020 15:10:57 -------------------------------------------------------------------------------- Activities of Daily Living Details Patient Name: Date of Service: PA [name] 09/12/2020 2:45 PM Medical Record Number: [id number] Patient Account Number: [id number] Date of Birth/Sex: Treating RN: 04-20-1946 (75 y.o. Male) Lorrin Jackson Primary Care Jermiyah Ricotta: Erlinda Hong, SIMO NE Other Clinician: Referring Marcee Jacobs: Treating Kely Dohn/Extender: Leward Quan TT, SIMO NE Weeks in Treatment: 0 Activities of Daily Living Items Answer Activities of Daily Living (Please select one for each item) Drive Automobile Completely Able T Medications ake Completely Able Use T elephone Completely Able Care for Appearance Completely Able Use T oilet Completely Able Bath / Shower Completely Able Dress Self Completely Able Feed Self Completely Able Walk Completely Able Get In / Out Bed Completely Able Housework Completely Able Prepare Meals Need Assistance Handle Money Completely Able Shop for Self Need Assistance Electronic Signature(s) Signed: 09/12/2020 6:07:55  PM By: Lorrin Jackson Entered By: Lorrin Jackson on 09/12/2020 15:11:37 -------------------------------------------------------------------------------- Education Screening Details Patient Name: Date of Service: PA [name]. 09/12/2020 2:45 PM Medical Record Number: [id number] Patient Account Number: [id number] Date of Birth/Sex: Treating RN: January 13, 1946 (74 y.o. Male) Lorrin Jackson Primary Care Joffre Lucks: Erlinda Hong, SIMO NE Other Clinician: Referring Amaia Lavallie: Treating Basem Yannuzzi/Extender: Larence Penning, SIMO NE Weeks in Treatment: 0 Primary Learner Assessed: Patient Learning Preferences/Education Level/Primary Language Learning Preference: Explanation, Demonstration, Printed Material Highest Education Level: Grade School Preferred Language: English Cognitive Barrier Language Barrier: No Translator Needed: No Memory Deficit: No Emotional Barrier: No Cultural/Religious Beliefs Affecting Medical Care: No Physical Barrier Impaired Vision: Yes Glasses Impaired Hearing: No Decreased Hand dexterity: No Knowledge/Comprehension Knowledge Level: Medium Comprehension Level: Medium Ability to understand written instructions: Medium Ability to understand verbal instructions: Medium Motivation Anxiety Level: Calm Cooperation: Cooperative Education Importance: Acknowledges Need Interest in Health Problems: Asks Questions Perception: Coherent Willingness to Engage in Self-Management Medium Activities: Readiness to Engage in Self-Management Medium Activities: Electronic Signature(s) Signed: 09/12/2020 6:07:55 PM By: Lorrin Jackson Entered By: Lorrin Jackson on 09/12/2020 15:12:17 -------------------------------------------------------------------------------- Fall Risk Assessment Details Patient Name: Date of Service: PA [name]. 09/12/2020 2:45 PM Medical Record Number: [id number] Patient Account Number: [id number] Date of Birth/Sex: Treating  RN: [birth year] (74 y.o. Male) Lorrin Jackson Primary Care Baila Rouse: Erlinda Hong, SIMO NE Other Clinician: Referring Aspen Deterding: Treating Young Mulvey/Extender: Larence Penning, SIMO NE Weeks in Treatment: 0 Fall Risk Assessment Items Have you had 2 or more falls in the last 12 monthso 0 No Have you had any fall that resulted in injury in the last 12 monthso 0 No FALLS RISK SCREEN History of falling - immediate or within 3 months 0 No Secondary diagnosis (Do you have 2 or more medical diagnoseso) 0 No Ambulatory aid None/bed rest/wheelchair/nurse 0 Yes  Crutches/cane/walker 0 No Furniture 0 No Intravenous therapy Access/Saline/Heparin Lock 0 No Gait/Transferring Normal/ bed rest/ wheelchair 0 Yes Weak (short steps with or without shuffle, stooped but able to lift head while walking, may seek 0 No support from furniture) Impaired (short steps with shuffle, may have difficulty arising from chair, head down, impaired 0 No balance) Mental Status Oriented to own ability 0 Yes Electronic Signature(s) Signed: 09/12/2020 6:07:55 PM By: Lorrin Jackson Entered By: Lorrin Jackson on 09/12/2020 15:12:31 -------------------------------------------------------------------------------- Foot Assessment Details Patient Name: Date of Service: PA [name]. 09/12/2020 2:45 PM Medical Record Number: [id number] Patient Account Number: [id number] Date of Birth/Sex: Treating RN: 1946/02/18 (75 y.o. Male) Lorrin Jackson Primary Care Augie Vane: Erlinda Hong, SIMO NE Other Clinician: Referring Sheldon Sem: Treating Izaac Reisig/Extender: Larence Penning, SIMO NE Weeks in Treatment: 0 Foot Assessment Items Site Locations + = Sensation present, - = Sensation absent, C = Callus, U = Ulcer R = Redness, W = Warmth, M = Maceration, PU = Pre-ulcerative lesion F = Fissure, S = Swelling, D = Dryness Assessment Right: Left: Other Deformity: No No Prior Foot Ulcer: No No Prior  Amputation: No No Charcot Joint: No No Ambulatory Status: Gait: Notes N/A= Arm wounds Electronic Signature(s) Signed: 09/12/2020 6:07:55 PM By: Lorrin Jackson Entered By: Lorrin Jackson on 09/12/2020 15:13:02 -------------------------------------------------------------------------------- Nutrition Risk Screening Details Patient Name: Date of Service: PA [name] 09/12/2020 2:45 PM Medical Record Number: [id number] Patient Account Number: [id number] Date of Birth/Sex: Treating RN: 15-Jan-1946 (75 y.o. Male) Lorrin Jackson Primary Care Shaquil Aldana: Erlinda Hong, SIMO NE Other Clinician: Referring Hosam Mcfetridge: Treating Dalen Hennessee/Extender: Harvel Quale UTRY-LO TT, SIMO NE Weeks in Treatment: 0 Height (in): 72 Weight (lbs): 165 Body Mass Index (BMI): 22.4 Nutrition Risk Screening Items Score Screening NUTRITION RISK SCREEN: I have an illness or condition that made me change the kind and/or amount of food I eat 0 No I eat fewer than two meals per day 0 No I eat few fruits and vegetables, or milk products 0 No I have three or more drinks of beer, liquor or wine almost every day 0 No I have tooth or mouth problems that make it hard for me to eat 0 No I don't always have enough money to buy the food I need 0 No I eat alone most of the time 0 No I take three or more different prescribed or over-the-counter drugs a day 1 Yes Without wanting to, I have lost or gained 10 pounds in the last six months 0 No I am not always physically able to shop, cook and/or feed myself 0 No Nutrition Protocols Good Risk Protocol 0 No interventions needed Moderate Risk Protocol High Risk Proctocol Risk Level: Good Risk Score: 1 Electronic Signature(s) Signed: 09/12/2020 6:07:55 PM By: Lorrin Jackson Entered By: Lorrin Jackson on 09/12/2020 15:12:42

## 2020-09-13 ENCOUNTER — Other Ambulatory Visit (HOSPITAL_COMMUNITY): Payer: Self-pay | Admitting: Internal Medicine

## 2020-09-13 DIAGNOSIS — I10 Essential (primary) hypertension: Secondary | ICD-10-CM

## 2020-09-13 DIAGNOSIS — R009 Unspecified abnormalities of heart beat: Secondary | ICD-10-CM

## 2020-09-17 NOTE — Progress Notes (Signed)
Shawn, Thomas ([id number]) Visit Report for 09/12/2020 Allergy List Details Patient Name: Date of Service: PA [name] 09/12/2020 2:45 PM Medical Record Number: [id number] Patient Account Number: [id number] Date of Birth/Sex: Treating RN: [birth year] (75 y.o. Male) Lorrin Jackson Primary Care Valaria Kohut: Erlinda Hong, SIMO NE Other Clinician: Referring York Valliant: Treating Brice Kossman/Extender: Leward Quan TT, SIMO NE Weeks in Treatment: 0 Allergies Active Allergies No Known Allergies Allergy Notes Electronic Signature(s) Signed: 09/12/2020 6:07:55 PM By: Lorrin Jackson Entered By: Lorrin Jackson on 09/12/2020 15:04:40 -------------------------------------------------------------------------------- Arrival Information Details Patient Name: Date of Service: PA [name]. 09/12/2020 2:45 PM Medical Record Number: [id number] Patient Account Number: [id number] Date of Birth/Sex: Treating RN: [birth year] (74 y.o. Male) Lorrin Jackson Primary Care Shylie Polo: Erlinda Hong, SIMO NE Other Clinician: Referring Kairie Vangieson: Treating Vitaliy Eisenhour/Extender: Larence Penning, SIMO NE Weeks in Treatment: 0 Visit Information Patient Arrived: Ambulatory Arrival Time: 15:02 Transfer Assistance: None Patient Identification Verified: Yes Secondary Verification Process Completed: Yes Patient Requires Transmission-Based Precautions: No Patient Has Alerts: Yes Patient Alerts: Patient on Blood Thinner Electronic Signature(s) Signed: 09/12/2020 6:07:55 PM By: Lorrin Jackson Entered By: Lorrin Jackson on 09/12/2020 15:03:06 -------------------------------------------------------------------------------- Clinic Level of Care Assessment Details Patient Name: Date of Service: PA [name] 09/12/2020 2:45 PM Medical Record Number: [id number] Patient Account Number: [id number] Date of Birth/Sex: Treating RN: February 08, 1946 (74 y.o. Male) Rhae Hammock Primary Care  Jiselle Sheu: Erlinda Hong, SIMO NE Other Clinician: Referring Jazir Newey: Treating Yuchen Fedor/Extender: Larence Penning, SIMO NE Weeks in Treatment: 0 Clinic Level of Care Assessment Items TOOL 4 Quantity Score X- 1 0 Use when only an EandM is performed on FOLLOW-UP visit ASSESSMENTS - Nursing Assessment / Reassessment X- 1 10 Reassessment of Co-morbidities (includes updates in patient status) X- 1 5 Reassessment of Adherence to Treatment Plan ASSESSMENTS - Wound and Skin A ssessment / Reassessment X - Simple Wound Assessment / Reassessment - one wound 1 5 []  - 0 Complex Wound Assessment / Reassessment - multiple wounds X- 1 10 Dermatologic / Skin Assessment (not related to wound area) ASSESSMENTS - Focused Assessment []  - 0 Circumferential Edema Measurements - multi extremities X- 1 10 Nutritional Assessment / Counseling / Intervention []  - 0 Lower Extremity Assessment (monofilament, tuning fork, pulses) []  - 0 Peripheral Arterial Disease Assessment (using hand held doppler) ASSESSMENTS - Ostomy and/or Continence Assessment and Care []  - 0 Incontinence Assessment and Management []  - 0 Ostomy Care Assessment and Management (repouching, etc.) PROCESS - Coordination of Care []  - 0 Simple Patient / Family Education for ongoing care X- 1 20 Complex (extensive) Patient / Family Education for ongoing care X- 1 10 Staff obtains Programmer, systems, Records, T Results / Process Orders est []  - 0 Staff telephones HHA, Nursing Homes / Clarify orders / etc []  - 0 Routine Transfer to another Facility (non-emergent condition) []  - 0 Routine Hospital Admission (non-emergent condition) X- 1 15 New Admissions / Biomedical engineer / Ordering NPWT Apligraf, etc. , []  - 0 Emergency Hospital Admission (emergent condition) X- 1 10 Simple Discharge Coordination []  - 0 Complex (extensive) Discharge Coordination PROCESS - Special Needs []  - 0 Pediatric / Minor Patient  Management []  - 0 Isolation Patient Management []  - 0 Hearing / Language / Visual special needs []  - 0 Assessment of Community assistance (transportation, D/C planning, etc.) []  - 0 Additional assistance / Altered mentation []  - 0 Support Surface(s) Assessment (bed, cushion, seat, etc.) INTERVENTIONS - Wound Cleansing /  Measurement []  - 0 Simple Wound Cleansing - one wound X- 2 5 Complex Wound Cleansing - multiple wounds X- 1 5 Wound Imaging (photographs - any number of wounds) []  - 0 Wound Tracing (instead of photographs) []  - 0 Simple Wound Measurement - one wound X- 2 5 Complex Wound Measurement - multiple wounds INTERVENTIONS - Wound Dressings []  - 0 Small Wound Dressing one or multiple wounds X- 2 15 Medium Wound Dressing one or multiple wounds []  - 0 Large Wound Dressing one or multiple wounds []  - 0 Application of Medications - topical []  - 0 Application of Medications - injection INTERVENTIONS - Miscellaneous []  - 0 External ear exam []  - 0 Specimen Collection (cultures, biopsies, blood, body fluids, etc.) []  - 0 Specimen(s) / Culture(s) sent or taken to Lab for analysis []  - 0 Patient Transfer (multiple staff / Civil Service fast streamer / Similar devices) []  - 0 Simple Staple / Suture removal (25 or less) []  - 0 Complex Staple / Suture removal (26 or more) []  - 0 Hypo / Hyperglycemic Management (close monitor of Blood Glucose) []  - 0 Ankle / Brachial Index (ABI) - do not check if billed separately X- 1 5 Vital Signs Has the patient been seen at the hospital within the last three years: Yes Total Score: 155 Level Of Care: New/Established - Level 4 Electronic Signature(s) Signed: 09/12/2020 5:45:48 PM By: Rhae Hammock RN Entered By: Rhae Hammock on 09/12/2020 15:48:16 -------------------------------------------------------------------------------- Encounter Discharge Information Details Patient Name: Date of Service: PA [name]. 09/12/2020 2:45  PM Medical Record Number: [id number] Patient Account Number: [id number] Date of Birth/Sex: Treating RN: 05/06/1946 (75 y.o. Male) Levan Hurst Primary Care Lilly Gasser: Erlinda Hong, SIMO NE Other Clinician: Referring Ladashia Demarinis: Treating Tamario Heal/Extender: Larence Penning, SIMO NE Weeks in Treatment: 0 Encounter Discharge Information Items Discharge Condition: Stable Ambulatory Status: Ambulatory Discharge Destination: Home Transportation: Private Auto Accompanied By: alone Schedule Follow-up Appointment: Yes Clinical Summary of Care: Patient Declined Electronic Signature(s) Signed: 09/12/2020 6:17:09 PM By: Levan Hurst RN, BSN Entered By: Levan Hurst on 09/12/2020 18:15:14 -------------------------------------------------------------------------------- Lower Extremity Assessment Details Patient Name: Date of Service: PA [name]. 09/12/2020 2:45 PM Medical Record Number: [id number] Patient Account Number: [id number] Date of Birth/Sex: Treating RN: 17-Sep-1945 (74 y.o. Male) Lorrin Jackson Primary Care Jeanenne Licea: Erlinda Hong, SIMO NE Other Clinician: Referring Kamorie Aldous: Treating Tymier Lindholm/Extender: Harvel Quale UTRY-LO TT, SIMO NE Weeks in Treatment: 0 Notes N/A=Arm Wounds Electronic Signature(s) Signed: 09/12/2020 6:07:55 PM By: Lorrin Jackson Entered By: Lorrin Jackson on 09/12/2020 15:13:19 -------------------------------------------------------------------------------- Multi Wound Chart Details Patient Name: Date of Service: PA [name]. 09/12/2020 2:45 PM Medical Record Number: [id number] Patient Account Number: [id number] Date of Birth/Sex: Treating RN: September 06, 1945 (74 y.o. Male) Rhae Hammock Primary Care Mirza Kidney: Erlinda Hong, SIMO NE Other Clinician: Referring Quince Santana: Treating Rechelle Niebla/Extender: Harvel Quale UTRY-LO TT, SIMO NE Weeks in Treatment: 0 Vital Signs Height(in): 72 Pulse(bpm): 130 Weight(lbs):  165 Blood Pressure(mmHg): 109/72 Body Mass Index(BMI): 22 Temperature(F): 98.7 Respiratory Rate(breaths/min): 18 Photos: [1:No Photos Left Forearm] [2:No Photos Left Wrist] [N/A:N/A N/A] Wound Location: [1:Other Lesion] [2:Other Lesion] [N/A:N/A] Wounding Event: [1:Malignant Wound] [2:Malignant Wound] [N/A:N/A] Primary Etiology: [1:Chronic Obstructive Pulmonary] [2:Chronic Obstructive Pulmonary] [N/A:N/A] Comorbid History: [1:Disease (COPD) 09/03/2015] [2:Disease (COPD) 09/03/2015] [N/A:N/A] Date Acquired: [1:0] [2:0] [N/A:N/A] Weeks of Treatment: [1:Open] [2:Open] [N/A:N/A] Wound Status: [1:2.5x2.8x0.2] [2:1.7x4x0.3] [N/A:N/A] Measurements L x W x D (cm) [1:5.498] [2:5.341] [N/A:N/A] A (cm) : rea [1:1.1] [2:1.602] [N/A:N/A] Volume (cm) : [  1:Full Thickness Without Exposed] [2:Full Thickness Without Exposed] [N/A:N/A] Classification: [1:Support Structures Medium] [2:Support Structures Medium] [N/A:N/A] Exudate A mount: [1:Sanguinous] [2:Sanguinous] [N/A:N/A] Exudate Type: [1:red] [2:red] [N/A:N/A] Exudate Color: [1:Distinct, outline attached] [2:Well defined, not attached] [N/A:N/A] Wound Margin: [1:Large (67-100%)] [2:Medium (34-66%)] [N/A:N/A] Granulation Amount: [1:Red, Pink] [2:Red, Pink] [N/A:N/A] Granulation Quality: [1:None Present (0%)] [2:Medium (34-66%)] [N/A:N/A] Necrotic Amount: [1:N/A] [2:Eschar] [N/A:N/A] Necrotic Tissue: [1:Fat Layer (Subcutaneous Tissue): Yes Fat Layer (Subcutaneous Tissue): Yes N/A] Exposed Structures: [1:Fascia: No Tendon: No Muscle: No Joint: No Bone: No N/A] [2:Fascia: No Tendon: No Muscle: No Joint: No Bone: No None] [N/A:N/A] Treatment Notes Electronic Signature(s) Signed: 09/12/2020 5:23:41 PM By: Linton Ham MD Signed: 09/12/2020 5:45:48 PM By: Rhae Hammock RN Entered By: Linton Ham on 09/12/2020 15:55:04 -------------------------------------------------------------------------------- Multi-Disciplinary Care Plan Details Patient  Name: Date of Service: PA [address], Cecil Cranker. 09/12/2020 2:45 PM Medical Record Number: [id number] Patient Account Number: [id number] Date of Birth/Sex: Treating RN: 04-Feb-1946 (75 y.o. Male) Rhae Hammock Primary Care Cozy Veale: Erlinda Hong, SIMO NE Other Clinician: Referring Nivia Gervase: Treating Gerre Ranum/Extender: Larence Penning, SIMO NE Weeks in Treatment: 0 Active Inactive Orientation to the Wound Care Program Nursing Diagnoses: Knowledge deficit related to the wound healing center program Goals: Patient/caregiver will verbalize understanding of the [facility] Program Date Initiated: 09/12/2020 Target Resolution Date: 10/05/2020 Goal Status: Active Interventions: Provide education on orientation to the wound center Notes: Wound/Skin Impairment Nursing Diagnoses: Impaired tissue integrity Knowledge deficit related to smoking impact on wound healing Knowledge deficit related to ulceration/compromised skin integrity Goals: Patient will demonstrate a reduced rate of smoking or cessation of smoking Date Initiated: 09/12/2020 Target Resolution Date: 10/05/2020 Goal Status: Active Patient will have a decrease in wound volume by X% from date: (specify in notes) Date Initiated: 09/12/2020 Target Resolution Date: 10/05/2020 Goal Status: Active Patient/caregiver will verbalize understanding of skin care regimen Date Initiated: 09/12/2020 Target Resolution Date: 10/02/2020 Goal Status: Active Interventions: Assess patient/caregiver ability to obtain necessary supplies Assess patient/caregiver ability to perform ulcer/skin care regimen upon admission and as needed Provide education on smoking Notes: Electronic Signature(s) Signed: 09/12/2020 5:45:48 PM By: Rhae Hammock RN Entered By: Rhae Hammock on 09/12/2020 15:40:02 -------------------------------------------------------------------------------- Pain Assessment Details Patient Name: Date of  Service: PA [name] 09/12/2020 2:45 PM Medical Record Number: [id number] Patient Account Number: [id number] Date of Birth/Sex: Treating RN: 1945-07-26 (75 y.o. Male) Lorrin Jackson Primary Care Dhyan Noah: Erlinda Hong, SIMO NE Other Clinician: Referring Adin Laker: Treating Jaima Janney/Extender: Larence Penning, SIMO NE Weeks in Treatment: 0 Active Problems Location of Pain Severity and Description of Pain Patient Has Paino Yes Site Locations Rate the pain. Current Pain Level: 8 Worst Pain Level: 10 Character of Pain Describe the Pain: Stabbing, Throbbing Pain Management and Medication Current Pain Management: Medication: Yes Cold Application: No Rest: Yes Massage: No Activity: No T.E.N.S.: No Heat Application: No Leg drop or elevation: No Is the Current Pain Management Adequate: Inadequate How does your wound impact your activities of daily livingo Sleep: Yes Bathing: No Appetite: No Relationship With Others: No Bladder Continence: No Emotions: No Bowel Continence: No Work: No Toileting: No Drive: No Dressing: No Hobbies: No Electronic Signature(s) Signed: 09/12/2020 6:07:55 PM By: Lorrin Jackson Entered By: Lorrin Jackson on 09/12/2020 15:14:05 -------------------------------------------------------------------------------- Patient/Caregiver Education Details Patient Name: Date of Service: PA [name] 4/14/2022andnbsp2:45 PM Medical Record Number: [id number] Patient Account Number: [id number] Date of Birth/Gender: Treating RN: 1946/02/11 (75 y.o. Male) Rhae Hammock Primary Care Physician: A  UTRY-LO TT, SIMO NE Other Clinician: Referring Physician: Treating Physician/Extender: Larence Penning, SIMO NE Weeks in Treatment: 0 Education Assessment Education Provided To: Patient Education Topics Provided Smoking and Wound Healing: Methods: Explain/Verbal Responses: State content correctly Welcome T [facility]: o Methods: Explain/Verbal Responses: State content correctly Electronic Signature(s) Signed: 09/12/2020 5:45:48 PM By: Rhae Hammock RN Entered By: Rhae Hammock on 09/12/2020 15:41:12 -------------------------------------------------------------------------------- Wound Assessment Details Patient Name: Date of Service: PA [name]. 09/12/2020 2:45 PM Medical Record Number: [id number] Patient Account Number: [id number] Date of Birth/Sex: Treating RN: 1945-12-26 (74 y.o. 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 Wound Status: Open Wounding Event: Other Lesion Comorbid History: Chronic Obstructive Pulmonary Disease (COPD) Date Acquired: 09/03/2015 Weeks Of Treatment: 0 Clustered Wound: No Photos Wound Measurements Length: (cm) 2.5 Width: (cm) 2.8 Depth: (cm) 0.2 Area: (cm) 5.498 Volume: (cm) 1.1 % Reduction in Area: 0% % Reduction in Volume: 0% Tunneling: No Undermining: No Wound Description Classification: Full Thickness Without Exposed Support Structures Wound Margin: Distinct, outline attached Exudate Amount: Medium Exudate Type: Sanguinous Exudate Color: red Foul Odor After Cleansing: No Slough/Fibrino No Wound Bed Granulation Amount: Large (67-100%) Exposed Structure Granulation Quality: Red, Pink Fascia Exposed: No Necrotic Amount: None Present (0%) Fat Layer (Subcutaneous Tissue) Exposed: Yes Tendon Exposed: No Muscle Exposed: No Joint Exposed: No Bone Exposed: No Treatment Notes Wound #1 (Forearm) Wound Laterality: Left Cleanser Wound Cleanser Discharge Instruction: Cleanse the wound with wound cleanser prior to applying a clean dressing using gauze sponges, not tissue or cotton  balls. Peri-Wound Care Topical Primary Dressing KerraCel Ag Gelling Fiber Dressing, 4x5 in (silver alginate) Discharge Instruction: Apply silver alginate to wound bed as instructed Secondary Dressing Woven Gauze Sponge, Non-Sterile 4x4 in Discharge Instruction: Apply over primary dressing as directed. Secured With Elastic Bandage 4 inch (ACE bandage) Discharge Instruction: Secure with ACE bandage as directed. Conforming Stretch Gauze Bandage, Sterile 2x75 (in/in) Discharge Instruction: Secure with stretch gauze as directed. Compression Wrap Compression Stockings Add-Ons Electronic Signature(s) Signed: 09/12/2020 6:07:55 PM By: Lorrin Jackson Signed: 09/17/2020 8:07:32 AM By: Sandre Kitty Entered By: Sandre Kitty on 09/12/2020 16:56:48 -------------------------------------------------------------------------------- Wound Assessment Details Patient Name: Date of Service: PA [name]. 09/12/2020 2:45 PM Medical Record Number: [id number] Patient Account Number: [id number] Date of Birth/Sex: Treating RN: 01-08-1946 (74 y.o. Male) Lorrin Jackson Primary Care Jin Shockley: Erlinda Hong, SIMO NE Other Clinician: Referring Eliya Bubar: Treating Taccara Bushnell/Extender: Larence Penning, SIMO NE Weeks in Treatment: 0 Wound Status Wound Number: 2 Primary Etiology: Malignant Wound Wound Location: Left Wrist Wound Status: Open Wounding Event: Other Lesion Comorbid History: Chronic Obstructive Pulmonary Disease (COPD) Date Acquired: 09/03/2015 Weeks Of Treatment: 0 Clustered Wound: No Photos Wound Measurements Length: (cm) 1.7 Width: (cm) 4 Depth: (cm) 0.3 Area: (cm) 5.341 Volume: (cm) 1.602 % Reduction in Area: 0% % Reduction in Volume: 0% Epithelialization: None Tunneling: No Undermining: No Wound Description Classification: Full Thickness Without Exposed Support Structures Wound Margin: Well defined, not attached Exudate Amount: Medium Exudate Type:  Sanguinous Exudate Color: red Foul Odor After Cleansing: No Slough/Fibrino No Wound Bed Granulation Amount: Medium (34-66%) Exposed Structure Granulation Quality: Red, Pink Fascia Exposed: No Necrotic Amount: Medium (34-66%) Fat Layer (Subcutaneous Tissue) Exposed: Yes Necrotic Quality: Eschar Tendon Exposed: No Muscle Exposed: No  Joint Exposed: No Bone Exposed: No Treatment Notes Wound #2 (Wrist) Wound Laterality: Left Cleanser Wound Cleanser Discharge Instruction: Cleanse the wound with wound cleanser prior to applying a clean dressing using gauze sponges, not tissue or cotton balls. Peri-Wound Care Topical Primary Dressing KerraCel Ag Gelling Fiber Dressing, 4x5 in (silver alginate) Discharge Instruction: Apply silver alginate to wound bed as instructed Secondary Dressing Woven Gauze Sponge, Non-Sterile 4x4 in Discharge Instruction: Apply over primary dressing as directed. Secured With Elastic Bandage 4 inch (ACE bandage) Discharge Instruction: Secure with ACE bandage as directed. Conforming Stretch Gauze Bandage, Sterile 2x75 (in/in) Discharge Instruction: Secure with stretch gauze as directed. Compression Wrap Compression Stockings Add-Ons Electronic Signature(s) Signed: 09/12/2020 6:07:55 PM By: Lorrin Jackson Signed: 09/17/2020 8:07:32 AM By: Sandre Kitty Entered By: Sandre Kitty on 09/12/2020 16:57:09 -------------------------------------------------------------------------------- Vitals Details Patient Name: Date of Service: PA [name]. 09/12/2020 2:45 PM Medical Record Number: [id number] Patient Account Number: [id number] Date of Birth/Sex: Treating RN: 20-Oct-1945 (74 y.o. Male) Lorrin Jackson Primary Care Taunja Brickner: Erlinda Hong, SIMO NE Other Clinician: Referring Nature Kueker: Treating Besse Miron/Extender: Larence Penning, SIMO NE Weeks in Treatment: 0 Vital Signs Time Taken: 15:03 Temperature (F): 98.7 Height (in): 72 Pulse  (bpm): 130 Source: Stated Respiratory Rate (breaths/min): 18 Weight (lbs): 165 Blood Pressure (mmHg): 109/72 Source: Stated Reference Range: 80 - 120 mg / dl Body Mass Index (BMI): 22.4 Electronic Signature(s) Signed: 09/12/2020 6:07:55 PM By: Lorrin Jackson Entered By: Lorrin Jackson on 09/12/2020 15:04:14

## 2020-09-19 ENCOUNTER — Other Ambulatory Visit: Payer: Self-pay

## 2020-09-19 ENCOUNTER — Encounter (HOSPITAL_BASED_OUTPATIENT_CLINIC_OR_DEPARTMENT_OTHER): Payer: Medicare Other | Admitting: Internal Medicine

## 2020-09-19 DIAGNOSIS — C44619 Basal cell carcinoma of skin of left upper limb, including shoulder: Secondary | ICD-10-CM

## 2020-09-19 DIAGNOSIS — L98499 Non-pressure chronic ulcer of skin of other sites with unspecified severity: Secondary | ICD-10-CM | POA: Diagnosis not present

## 2020-09-19 NOTE — Progress Notes (Signed)
Shawn, Thomas ([id number]) Visit Report for 09/19/2020 Chief Complaint Document Details Patient Name: Date of Service: PA [name] 09/19/2020 1:45 PM Medical Record Number: [id number] Patient Account Number: [id number] Date of Birth/Sex: Treating RN: 07-14-1945 (75 y.o. [name] Primary Care Provider: Gerlene Thomas Other Clinician: Referring Provider: Treating Provider/Extender: [name] in Treatment: 1 Information Obtained from: Patient Chief Complaint 09/12/2020; patient is sent here by Dr. Lanell Thomas for review of malignant wounds on the left arm and hand Electronic Signature(s) Signed: 09/19/2020 4:36:23 PM By: Shawn Shan DO Entered By: [name] on 09/19/2020 16:22:24 -------------------------------------------------------------------------------- Debridement Details Patient Name: Date of Service: PA [name]. 09/19/2020 1:45 PM Medical Record Number: [id number] Patient Account Number: [id number] Date of Birth/Sex: Treating RN: [birth year] (75 y.o. [name] Primary Care Provider: Gerlene Thomas Other Clinician: Referring Provider: Treating Provider/Extender: [name] in Treatment: 1 Debridement Performed for Assessment: Wound #1 Left Forearm Performed By: Clinician Baruch Gouty, RN Debridement Type: Chemical/Enzymatic/Mechanical Agent Used: gauze and wound cleanser Level of Consciousness (Pre-procedure): Awake and Alert Pre-procedure Verification/Time Out No Taken: Bleeding: None Response to Treatment: Procedure was tolerated well Level of Consciousness (Post- Awake and Alert procedure): Post Debridement Measurements of Total Wound Length: (cm) 3.2 Width: (cm) 2.8 Depth: (cm) 0.1 Volume: (cm) 0.704 Character of Wound/Ulcer Post Debridement: Improved Post Procedure Diagnosis Same as Pre-procedure Electronic Signature(s) Signed: 09/19/2020 4:36:23 PM  By: Shawn Shan DO Signed: 09/19/2020 5:27:07 PM By: Deon Pilling Entered By: Deon Pilling on 09/19/2020 14:39:34 -------------------------------------------------------------------------------- Debridement Details Patient Name: Date of Service: PA [name]. 09/19/2020 1:45 PM Medical Record Number: [id number] Patient Account Number: [id number] Date of Birth/Sex: Treating RN: [birth year] (75 y.o. [name] Primary Care Provider: Gerlene Thomas Other Clinician: Referring Provider: Treating Provider/Extender: [name] in Treatment: 1 Debridement Performed for Assessment: Wound #2 Left Wrist Performed By: Clinician Baruch Gouty, RN Debridement Type: Chemical/Enzymatic/Mechanical Agent Used: gauze and wound cleanser Level of Consciousness (Pre-procedure): Awake and Alert Pre-procedure Verification/Time Out No Taken: Bleeding: None Response to Treatment: Procedure was tolerated well Level of Consciousness (Post- Awake and Alert procedure): Post Debridement Measurements of Total Wound Length: (cm) 1 Width: (cm) 5.4 Depth: (cm) 0.3 Volume: (cm) 1.272 Character of Wound/Ulcer Post Debridement: Improved Post Procedure Diagnosis Same as Pre-procedure Electronic Signature(s) Signed: 09/19/2020 4:36:23 PM By: Shawn Shan DO Signed: 09/19/2020 5:27:07 PM By: Deon Pilling Entered By: Deon Pilling on 09/19/2020 14:39:53 -------------------------------------------------------------------------------- HPI Details Patient Name: Date of Service: PA [name]. 09/19/2020 1:45 PM Medical Record Number: [id number] Patient Account Number: [id number] Date of Birth/Sex: Treating RN: Dec 18, 1945 (75 y.o. [name] Primary Care Provider: Gerlene Thomas Other Clinician: Referring Provider: Treating Provider/Extender: [name] in Treatment: 1 History of Present Illness HPI Description:  ADMISSION 09/12/2020 This is a 75 year old man who was sent here by Dr. Lanell Thomas for review of malignant wounds on the left forearm and left hand. The patient tells me that the area on his left hand may have been there for as long as 7 years but the area on the forearm just noticed a year ago. It is actually the area on the forearm that is most painful. He is already been seen by Dr. Martin Majestic of dermatology who performed biopsies. 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 It was not felt that he was a good candidate for surgical treatment of this. He was referred to Dr. Lanell Thomas for consideration of radiation. Dr. Lanell Thomas noted significant risks of radiotherapy in her note. I believe she talk to Dr. Martin Majestic again about whether there is a possibility of surgical resection The patient tells me that he has not noted any difficulties in his hand or wrist. He does have pain in the dorsal arm area which she treats with either alcohol or peroxide. He is not putting any specific dressings on these areas. 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 He is a continued smoker. It would appear that he also has a history of coronary artery disease although I cannot really did get a history of chest pain out of him. It would appear that he also had endocarditis in 2018 so it is likely that the mitral valve replacement had to be done related to this although I do not have any specific information 4/21; patient presents for 1 week follow-up of his malignant wounds on his left forearm and left hand. He reports using silver alginate on these every other day. He denies fever/chills, purulent  drainage or increased warmth or erythema to the wound sites. There was serosanguineous drainage when bandages were removed by the nurse. He overall feels Well today and has no complaints. Electronic Signature(s) Signed: 09/19/2020 4:36:23 PM By: Shawn Shan DO Entered By: [name] on 09/19/2020 16:23:49 -------------------------------------------------------------------------------- Physical Exam Details Patient Name: Date of Service: PA [name] 09/19/2020 1:45 PM Medical Record Number: [id number] Patient Account Number: [id number] Date of Birth/Sex: Treating RN: June 20, 1945 (75 y.o. [name] Primary Care Provider: Gerlene Thomas Other Clinician: Referring Provider: Treating Provider/Extender: [name] in Treatment: 1 Constitutional respirations regular, non-labored and within target range for patient.. Notes Left upper extremity: The left dorsal forearm and dorsal wrist have irregular growth present. The wounds are open in the middle. No obvious signs of infection. There was bleeding noted. Tachycardic on exam. He overall appears well. Pulse felt regular Electronic Signature(s) Signed: 09/19/2020 4:36:23 PM By: Shawn Shan DO Entered By: [name] on 09/19/2020 16:26:14 -------------------------------------------------------------------------------- Physician Orders Details Patient Name: Date of Service: PA [name]. 09/19/2020 1:45 PM Medical Record Number: [id number] Patient Account Number: [id number] Date of Birth/Sex: Treating RN: 18-Mar-1946 (75 y.o. [name] Primary Care Provider: Gerlene Thomas Other Clinician: Referring Provider: Treating Provider/Extender: [name] in Treatment: 1 Verbal / Phone Orders: No Diagnosis Coding ICD-10 Coding Code Description C44.619 Basal cell carcinoma of skin of left upper limb, including shoulder J43.8 Other  emphysema Follow-up Appointments ppointment in 2 weeks. - ****Patient to schedule an appointment with a primary care provider.*** Return A ***Patient to follow up with Dermatology on 10/03/2020*** ***Patient to go to schedule appointment time for EKG at [facility] 10/17/2020 at 2 pm.*** Bathing/ Shower/ Hygiene May shower with protection but do not get wound dressing(s) wet. Wound Treatment Wound #1 - Forearm Wound Laterality: Left Cleanser: Soap and Water Every Other Day/15 Days Discharge Instructions: May shower and wash wound with dial antibacterial soap and water prior to dressing change. Prim Dressing: KerraCel Ag Gelling Fiber Dressing, 4x5 in (silver alginate) (DME) (Generic) Every Other Day/15 Days ary Discharge Instructions: Apply silver alginate to wound bed  as instructed Secondary Dressing: Woven Gauze Sponge, Non-Sterile 4x4 in (DME) (Generic) Every Other Day/15 Days Discharge Instructions: Apply over primary dressing as directed. Secured With: Elastic Bandage 4 inch (ACE bandage) (Generic) Every Other Day/15 Days Discharge Instructions: Secure with ACE bandage as directed. Secured With: Child psychotherapist, Sterile 2x75 (in/in) (DME) (Generic) Every Other Day/15 Days Discharge Instructions: Secure with stretch gauze as directed. Wound #2 - Wrist Wound Laterality: Left Cleanser: Soap and Water Every Other Day/15 Days Discharge Instructions: May shower and wash wound with dial antibacterial soap and water prior to dressing change. Prim Dressing: KerraCel Ag Gelling Fiber Dressing, 4x5 in (silver alginate) (DME) (Generic) Every Other Day/15 Days ary Discharge Instructions: Apply silver alginate to wound bed as instructed Secondary Dressing: Woven Gauze Sponge, Non-Sterile 4x4 in (DME) (Generic) Every Other Day/15 Days Discharge Instructions: Apply over primary dressing as directed. Secured With: Elastic Bandage 4 inch (ACE bandage) (Generic) Every Other Day/15  Days Discharge Instructions: Secure with ACE bandage as directed. Secured With: Child psychotherapist, Sterile 2x75 (in/in) (DME) (Generic) Every Other Day/15 Days Discharge Instructions: Secure with stretch gauze as directed. Radiation protection practitioner Primary Care Provider - Referral [facility] for a primary care provider for general health and well being. - (ICD10 J43.8 - Other emphysema) Electronic Signature(s) Signed: 09/19/2020 4:36:23 PM By: Shawn Shan DO Entered By: [name] on 09/19/2020 16:26:28 Prescription 09/19/2020 -------------------------------------------------------------------------------- Isac Caddy DO Patient Name: Provider: [birth year] [id number] Date of Birth: NPI#Jerilynn Mages [id number] Sex: DEA #: [phone] Phone #: License #: [place] Patient Address: [place], [place] [address] [address], [place] [address] [phone] Allergies No Known Allergies Provider's Orders Primary Care Provider - ICD10: J82.8 - Referral [facility] for a primary care provider for general health and well being. Hand Signature: Date(s): Electronic Signature(s) Signed: 09/19/2020 4:36:23 PM By: Shawn Shan DO Entered By: [name] on 09/19/2020 16:35:51 -------------------------------------------------------------------------------- Problem List Details Patient Name: Date of Service: PA [name]. 09/19/2020 1:45 PM Medical Record Number: [id number] Patient Account Number: [id number] Date of Birth/Sex: Treating RN: Jul 29, 1945 (75 y.o. [name] Primary Care Provider: Gerlene Thomas Other Clinician: Referring Provider: Treating Provider/Extender: [name] in Treatment: 1 Active Problems ICD-10 Encounter Code Description Active Date MDM Diagnosis C44.619  Basal cell carcinoma of skin of left upper limb, including shoulder 09/12/2020 No Yes J43.8 Other emphysema 09/12/2020 No Yes Inactive Problems Resolved Problems Electronic Signature(s) Signed: 09/19/2020 4:36:23 PM By: Shawn Shan DO Entered By: [name] on 09/19/2020 16:13:00 -------------------------------------------------------------------------------- Progress Note Details Patient Name: Date of Service: PA [name]. 09/19/2020 1:45 PM Medical Record Number: [id number] Patient Account Number: [id number] Date of Birth/Sex: Treating RN: September 02, 1945 (75 y.o. [name] Primary Care Provider: Gerlene Thomas Other Clinician: Referring Provider: Treating Provider/Extender: [name] in Treatment: 1 Subjective Chief Complaint Information obtained from Patient 09/12/2020; patient is sent here by Dr. Lanell Thomas for review of malignant wounds on the left arm and hand History of Present Illness (HPI) ADMISSION 09/12/2020 This is a 75 year old man who was sent here by Dr. Lanell Thomas for review of malignant wounds on the left forearm and left hand. The patient tells me that the area on his left hand may have been there for as long as 7 years but the area on the forearm just noticed a year ago. It is actually  the area on the forearm that is most painful. He is already been seen by Dr. Martin Majestic of dermatology who performed biopsies. 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 It was not felt that he was a good candidate for surgical treatment of this. He was referred to Dr. Lanell Thomas for consideration of radiation. Dr. Lanell Thomas noted significant risks of radiotherapy in her note. I believe she talk to Dr. Martin Majestic again about whether there is a possibility  of surgical resection The patient tells me that he has not noted any difficulties in his hand or wrist. He does have pain in the dorsal arm area which she treats with either alcohol or peroxide. He is not putting any specific dressings on these areas. 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 He is a continued smoker. It would appear that he also has a history of coronary artery disease although I cannot really did get a history of chest pain out of him. It would appear that he also had endocarditis in 2018 so it is likely that the mitral valve replacement had to be done related to this although I do not have any specific information 4/21; patient presents for 1 week follow-up of his malignant wounds on his left forearm and left hand. He reports using silver alginate on these every other day. He denies fever/chills, purulent drainage or increased warmth or erythema to the wound sites. There was serosanguineous drainage when bandages were removed by the nurse. He overall feels Well today and has no complaints. Patient History Information obtained from Patient. Family History No family history of Cancer, Diabetes, Heart Disease, Hereditary Spherocytosis, Hypertension, Kidney Disease, Lung Disease, Seizures, Stroke, Thyroid Problems, Tuberculosis. Social History Current every day smoker, Alcohol Use - Rarely, Drug Use - No History, Caffeine Use - Moderate. Medical History Respiratory Patient has history of Chronic Obstructive Pulmonary Disease (COPD) Medical A Surgical History Notes nd Integumentary (Skin) skin cancer Oncologic Skin Cancer Objective Constitutional respirations regular, non-labored and within target range for patient.. Vitals Time Taken: 2:00 PM, Height: 72 in, Source: Stated, Weight: 165 lbs, Source: Stated, BMI: 22.4, Temperature:  98.5 F, Pulse: 134 bpm, Respiratory Rate: 18 breaths/min, Blood Pressure: 109/72 mmHg. General Notes: Left upper extremity: The left dorsal forearm and dorsal wrist have irregular growth present. The wounds are open in the middle. No obvious signs of infection. There was bleeding noted. Tachycardic on exam. He overall appears well. Pulse felt regular Integumentary (Hair, Skin) Wound #1 status is Open. Original cause of wound was Other Lesion. The date acquired was: 09/03/2015. The wound has been in treatment 1 weeks. The wound is located on the Left Forearm. The wound measures 3.2cm length x 2.8cm width x 0.1cm depth; 7.037cm^2 area and 0.704cm^3 volume. There is Fat Layer (Subcutaneous Tissue) exposed. There is no tunneling or undermining noted. There is a medium amount of sanguinous drainage noted. The wound margin is distinct with the outline attached to the wound base. There is large (67-100%) pink, friable, hyper - granulation within the wound bed. There is no necrotic tissue within the wound bed. Wound #2 status is Open. Original cause of wound was Other Lesion. The date acquired was: 09/03/2015. The wound has  been in treatment 1 weeks. The wound is located on the Left Wrist. The wound measures 1cm length x 5.4cm width x 0.3cm depth; 4.241cm^2 area and 1.272cm^3 volume. There is Fat Layer (Subcutaneous Tissue) exposed. There is no tunneling or undermining noted. There is a medium amount of sanguinous drainage noted. The wound margin is well defined and not attached to the wound base. There is large (67-100%) red, pink, friable granulation within the wound bed. There is a small (1-33%) amount of necrotic tissue within the wound bed including Eschar. Assessment Active Problems ICD-10 Basal cell carcinoma of skin of left upper limb, including shoulder Other emphysema Patient presents for a 1 week follow-up. The wounds are relatively stable from last week. The wrist had to be cauterized with  silver nitrate due to serosanguineous bleeding. He follows up with his dermatologist on May 5. We will continue silver alginate with dressing changes and have him follow-up in 2 weeks. At last clinic visit he was noted to be tachycardic and an EKG was ordered. This has not been done yet. Today he is heart rate is in the 130s. Looking through epic there is no previous EKG or echo. He does have a history of heart rates in the 120s in 12/2019. This was noted in the vitals and any primary care appointment. There is no mention of it in the note. He states he does not have a primary care physician at this time and I recommended he establish care with community [facility]. A referral was made. Procedures Wound #1 Pre-procedure diagnosis of Wound #1 is a Malignant Wound located on the Left Forearm . There was a Chemical/Enzymatic/Mechanical debridement performed by Baruch Gouty, RN.. Other agent used was gauze and wound cleanser. There was no bleeding. The procedure was tolerated well. Post Debridement Measurements: 3.2cm length x 2.8cm width x 0.1cm depth; 0.704cm^3 volume. Character of Wound/Ulcer Post Debridement is improved. Post procedure Diagnosis Wound #1: Same as Pre-Procedure Wound #2 Pre-procedure diagnosis of Wound #2 is a Malignant Wound located on the Left Wrist . There was a Chemical/Enzymatic/Mechanical debridement performed by Baruch Gouty, RN.. Other agent used was gauze and wound cleanser. There was no bleeding. The procedure was tolerated well. Post Debridement Measurements: 1cm length x 5.4cm width x 0.3cm depth; 1.272cm^3 volume. Character of Wound/Ulcer Post Debridement is improved. Post procedure Diagnosis Wound #2: Same as Pre-Procedure Plan Follow-up Appointments: Return Appointment in 2 weeks. - ****Patient to schedule an appointment with a primary care provider.*** ***Patient to follow up with Dermatology on 10/03/2020*** ***Patient to go to schedule appointment time for  EKG at [facility] 10/17/2020 at 2 pm.*** Bathing/ Shower/ Hygiene: May shower with protection but do not get wound dressing(s) wet. ordered were: Primary Care Provider - Referral [facility] for a primary care provider for general health and well being. WOUND #1: - Forearm Wound Laterality: Left Cleanser: Soap and Water Every Other Day/15 Days Discharge Instructions: May shower and wash wound with dial antibacterial soap and water prior to dressing change. Prim Dressing: KerraCel Ag Gelling Fiber Dressing, 4x5 in (silver alginate) (DME) (Generic) Every Other Day/15 Days ary Discharge Instructions: Apply silver alginate to wound bed as instructed Secondary Dressing: Woven Gauze Sponge, Non-Sterile 4x4 in (DME) (Generic) Every Other Day/15 Days Discharge Instructions: Apply over primary dressing as directed. Secured With: Elastic Bandage 4 inch (ACE bandage) (Generic) Every Other Day/15 Days Discharge Instructions: Secure with ACE bandage as directed. Secured With: Child psychotherapist, Sterile 2x75 (in/in) (DME) (Generic)  Every Other Day/15 Days Discharge Instructions: Secure with stretch gauze as directed. WOUND #2: - Wrist Wound Laterality: Left Cleanser: Soap and Water Every Other Day/15 Days Discharge Instructions: May shower and wash wound with dial antibacterial soap and water prior to dressing change. Prim Dressing: KerraCel Ag Gelling Fiber Dressing, 4x5 in (silver alginate) (DME) (Generic) Every Other Day/15 Days ary Discharge Instructions: Apply silver alginate to wound bed as instructed Secondary Dressing: Woven Gauze Sponge, Non-Sterile 4x4 in (DME) (Generic) Every Other Day/15 Days Discharge Instructions: Apply over primary dressing as directed. Secured With: Elastic Bandage 4 inch (ACE bandage) (Generic) Every Other Day/15 Days Discharge Instructions: Secure with ACE bandage as directed. Secured With: Child psychotherapist, Sterile 2x75  (in/in) (DME) (Generic) Every Other Day/15 Days Discharge Instructions: Secure with stretch gauze as directed. 1. Silver alginate to the wounds every other day with dressing changes 2. Follow-up in 2 weeks 3. Referral to establish with a PCP Electronic Signature(s) Signed: 09/19/2020 4:36:23 PM By: Shawn Shan DO Entered By: [name] on 09/19/2020 16:34:39 -------------------------------------------------------------------------------- HxROS Details Patient Name: Date of Service: PA [name]. 09/19/2020 1:45 PM Medical Record Number: [id number] Patient Account Number: [id number] Date of Birth/Sex: Treating RN: 18-Jun-1945 (75 y.o. [name] Primary Care Provider: Gerlene Thomas Other Clinician: Referring Provider: Treating Provider/Extender: [name] in Treatment: 1 Information Obtained From Patient Respiratory Medical History: Positive for: Chronic Obstructive Pulmonary Disease (COPD) Integumentary (Skin) Medical History: Past Medical History Notes: skin cancer Oncologic Medical History: Past Medical History Notes: Skin Cancer Immunizations Pneumococcal Vaccine: Received Pneumococcal Vaccination: No Implantable Devices None Family and Social History Cancer: No; Diabetes: No; Heart Disease: No; Hereditary Spherocytosis: No; Hypertension: No; Kidney Disease: No; Lung Disease: No; Seizures: No; Stroke: No; Thyroid Problems: No; Tuberculosis: No; Current every day smoker; Alcohol Use: Rarely; Drug Use: No History; Caffeine Use: Moderate; Financial Concerns: No; Food, Clothing or Shelter Needs: No; Support System Lacking: No; Transportation Concerns: No Electronic Signature(s) Signed: 09/19/2020 4:36:23 PM By: Shawn Shan DO Signed: 09/19/2020 5:27:07 PM By: Deon Pilling Entered By: [name] on 09/19/2020  16:23:57 -------------------------------------------------------------------------------- SuperBill Details Patient Name: Date of Service: PA [name] 09/19/2020 Medical Record Number: [id number] Patient Account Number: [id number] Date of Birth/Sex: Treating RN: 1945/09/22 (75 y.o. [name] Primary Care Provider: Gerlene Thomas Other Clinician: Referring Provider: Treating Provider/Extender: [name] in Treatment: 1 Diagnosis Coding ICD-10 Codes Code Description [phone] Basal cell carcinoma of skin of left upper limb, including shoulder J43.8 Other emphysema Facility Procedures CPT4 Code: [id number] Description: 7602 - DEBRIDE W/O ANES NON SELECT 1 Modifier: Quantity: Electronic Signature(s) Signed: 09/19/2020 4:36:23 PM By: Shawn Shan DO Entered By: [name] on 09/19/2020 16:35:48

## 2020-09-24 NOTE — Progress Notes (Signed)
[name], [name] ([id number]) Visit Report for 09/19/2020 Arrival Information Details Patient Name: Date of Service: PA [name] 09/19/2020 1:45 PM Medical Record Number: [id number] Patient Account Number: [id number] Date of Birth/Sex: Treating RN: 05/07/1946 (75 y.o. [name] Primary Care [name]: [name] Other Clinician: Referring [name]: Treating [name]/Extender: [name] in Treatment: 1 Visit Information History Since Last Visit Added or deleted any medications: No Patient Arrived: Ambulatory Any new allergies or adverse reactions: No Arrival Time: 13:56 Had a fall or experienced change in No Accompanied By: self activities of daily living that may affect Transfer Assistance: None risk of falls: Patient Identification Verified: Yes Signs or symptoms of abuse/neglect since last visito No Secondary Verification Process Completed: Yes Hospitalized since last visit: No Patient Requires Transmission-Based Precautions: No Implantable device outside of the clinic excluding No Patient Has Alerts: Yes cellular tissue based products placed in the center Patient Alerts: Patient on Blood Thinner since last visit: Has Dressing in Place as Prescribed: Yes Pain Present Now: Yes Electronic Signature(s) Signed: 09/20/2020 5:10:31 PM By: Baruch Gouty RN, BSN Entered By: Baruch Gouty on 09/19/2020 14:03:45 -------------------------------------------------------------------------------- Encounter Discharge Information Details Patient Name: Date of Service: PA [name]. 09/19/2020 1:45 PM Medical Record Number: [id number] Patient Account Number: [id number] Date of Birth/Sex: Treating RN: 09-13-1945 (75 y.o. [name] Primary Care Deztiny Sarra: [name] Other Clinician: Referring [name]: Treating [name]/Extender: [name] in Treatment: 1 Encounter Discharge  Information Items Post Procedure Vitals Discharge Condition: Stable Temperature (F): 98.5 Ambulatory Status: Ambulatory Pulse (bpm): 134 Discharge Destination: Home Respiratory Rate (breaths/min): 18 Transportation: Private Auto Blood Pressure (mmHg): 109/72 Accompanied By: self Schedule Follow-up Appointment: Yes Clinical Summary of Care: Patient Declined Electronic Signature(s) Signed: 09/20/2020 5:10:31 PM By: Baruch Gouty RN, BSN Entered By: Baruch Gouty on 09/19/2020 14:58:24 -------------------------------------------------------------------------------- Lower Extremity Assessment Details Patient Name: Date of Service: PA [name] 09/19/2020 1:45 PM Medical Record Number: [id number] Patient Account Number: [id number] Date of Birth/Sex: Treating RN: [birth year] (75 y.o. [name] Primary Care Simrin Vegh: [name] Other Clinician: Referring Krithika Tome: Treating [name]/Extender: [name] in Treatment: 1 Electronic Signature(s) Signed: 09/20/2020 5:10:31 PM By: Baruch Gouty RN, BSN Entered By: Baruch Gouty on 09/19/2020 14:15:11 -------------------------------------------------------------------------------- Multi Wound Chart Details Patient Name: Date of Service: PA [name]. 09/19/2020 1:45 PM Medical Record Number: [id number] Patient Account Number: [id number] Date of Birth/Sex: Treating RN: 15-Mar-1946 (75 y.o. [name] Primary Care Dakin Madani: [name] Other Clinician: Referring Nicco Reaume: Treating Anae Hams/Extender: [name] in Treatment: 1 Vital Signs Height(in): 72 Pulse(bpm): 134 Weight(lbs): 165 Blood Pressure(mmHg): 109/72 Body Mass Index(BMI): 22 Temperature(F): 98.5 Respiratory Rate(breaths/min): 18 Photos: [1:No Photos Left Forearm] [2:No Photos Left Wrist] [N/A:N/A N/A] Wound Location: [1:Other Lesion] [2:Other Lesion]  [N/A:N/A] Wounding Event: [1:Malignant Wound] [2:Malignant Wound] [N/A:N/A] Primary Etiology: [1:Chronic Obstructive Pulmonary] [2:Chronic Obstructive Pulmonary] [N/A:N/A] Comorbid History: [1:Disease (COPD) 09/03/2015] [2:Disease (COPD) 09/03/2015] [N/A:N/A] Date Acquired: [1:1] [2:1] [N/A:N/A] Weeks of Treatment: [1:Open] [2:Open] [N/A:N/A] Wound Status: [1:3.2x2.8x0.1] [2:1x5.4x0.3] [N/A:N/A] Measurements L x W x D (cm) [1:7.037] [2:4.241] [N/A:N/A] A (cm) : rea [1:0.704] [2:1.272] [N/A:N/A] Volume (cm) : [1:-28.00%] [2:20.60%] [N/A:N/A] % Reduction in Area: [1:36.00%] [2:20.60%] [N/A:N/A] % Reduction in Volume: [1:Full Thickness Without Exposed] [2:Full Thickness Without Exposed] [N/A:N/A] Classification: [1:Support Structures Medium] [2:Support Structures Medium] [N/A:N/A] Exudate A mount: [1:Sanguinous] [2:Sanguinous] [N/A:N/A] Exudate Type: [1:red] [2:red] [N/A:N/A] Exudate Color: [1:Distinct, outline attached] [2:Well defined, not attached] [N/A:N/A]  Wound Margin: [1:Large (67-100%)] [2:Large (67-100%)] [N/A:N/A] Granulation Amount: [1:Pink, Hyper-granulation, Friable] [2:Red, Pink, Friable] [N/A:N/A] Granulation Quality: [1:None Present (0%)] [2:Small (1-33%)] [N/A:N/A] Necrotic Amount: [1:N/A] [2:Eschar] [N/A:N/A] Necrotic Tissue: [1:Fat Layer (Subcutaneous Tissue): Yes Fat Layer (Subcutaneous Tissue): Yes N/A] Exposed Structures: [1:Fascia: No Tendon: No Muscle: No Joint: No Bone: No None] [2:Fascia: No Tendon: No Muscle: No Joint: No Bone: No Small (1-33%)] [N/A:N/A] Epithelialization: [1:Chemical/Enzymatic/Mechanical] [2:Chemical/Enzymatic/Mechanical] [N/A:N/A] Debridement: [1:N/A] [2:N/A] [N/A:N/A] Instrument: [1:None] [2:None] [N/A:N/A] Bleeding: [1:Procedure was tolerated well] [2:Procedure was tolerated well] [N/A:N/A] Debridement Treatment Response: [1:3.2x2.8x0.1] [2:1x5.4x0.3] [N/A:N/A] Post Debridement Measurements L x W x D (cm) [1:0.704] [2:1.272] [N/A:N/A] Post  Debridement Volume: (cm) [1:Debridement] [2:Debridement] [N/A:N/A] Treatment Notes Wound #1 (Forearm) Wound Laterality: Left Cleanser Soap and Water Discharge Instruction: May shower and wash wound with dial antibacterial soap and water prior to dressing change. Peri-Wound Care Topical Primary Dressing KerraCel Ag Gelling Fiber Dressing, 4x5 in (silver alginate) Discharge Instruction: Apply silver alginate to wound bed as instructed Secondary Dressing Woven Gauze Sponge, Non-Sterile 4x4 in Discharge Instruction: Apply over primary dressing as directed. Secured With Elastic Bandage 4 inch (ACE bandage) Discharge Instruction: Secure with ACE bandage as directed. Conforming Stretch Gauze Bandage, Sterile 2x75 (in/in) Discharge Instruction: Secure with stretch gauze as directed. Compression Wrap Compression Stockings Add-Ons Wound #2 (Wrist) Wound Laterality: Left Cleanser Soap and Water Discharge Instruction: May shower and wash wound with dial antibacterial soap and water prior to dressing change. Peri-Wound Care Topical Primary Dressing KerraCel Ag Gelling Fiber Dressing, 4x5 in (silver alginate) Discharge Instruction: Apply silver alginate to wound bed as instructed Secondary Dressing Secured With Elastic Bandage 4 inch (ACE bandage) Discharge Instruction: Secure with ACE bandage as directed. Conforming Stretch Gauze Bandage, Sterile 2x75 (in/in) Discharge Instruction: Secure with stretch gauze as directed. Compression Wrap Compression Stockings Add-Ons Electronic Signature(s) Signed: 09/19/2020 4:36:23 PM By: Kalman Shan DO Signed: 09/19/2020 5:27:07 PM By: Deon Pilling Entered By: Kalman Shan on 09/19/2020 16:22:05 -------------------------------------------------------------------------------- Multi-Disciplinary Care Plan Details Patient Name: Date of Service: PA [name] 09/19/2020 1:45 PM Medical Record Number: [id number] Patient Account Number:  [id number] Date of Birth/Sex: Treating RN: 09-23-1945 (75 y.o. [name] Primary Care Ladasha Schnackenberg: [name] Other Clinician: Referring Frona Yost: Treating Brinly Maietta/Extender: [name] in Treatment: 1 Active Inactive Wound/Skin Impairment Nursing Diagnoses: Impaired tissue integrity Knowledge deficit related to smoking impact on wound healing Knowledge deficit related to ulceration/compromised skin integrity Goals: Patient will demonstrate a reduced rate of smoking or cessation of smoking Date Initiated: 09/12/2020 Target Resolution Date: 10/05/2020 Goal Status: Active Patient will have a decrease in wound volume by X% from date: (specify in notes) Date Initiated: 09/12/2020 Target Resolution Date: 10/05/2020 Goal Status: Active Patient/caregiver will verbalize understanding of skin care regimen Date Initiated: 09/12/2020 Target Resolution Date: 10/02/2020 Goal Status: Active Interventions: Assess patient/caregiver ability to obtain necessary supplies Assess patient/caregiver ability to perform ulcer/skin care regimen upon admission and as needed Provide education on smoking Notes: Electronic Signature(s) Signed: 09/19/2020 5:27:07 PM By: Deon Pilling Entered By: Deon Pilling on 09/19/2020 13:30:28 -------------------------------------------------------------------------------- Pain Assessment Details Patient Name: Date of Service: PA [name] 09/19/2020 1:45 PM Medical Record Number: [id number] Patient Account Number: [id number] Date of Birth/Sex: Treating RN: 1945/11/08 (75 y.o. [name] Primary Care Mikayela Deats: [name] Other Clinician: Referring Judit Awad: Treating Latham Kinzler/Extender: [name] in Treatment: 1 Active Problems Location of Pain Severity and Description of Pain Patient Has Paino Yes Site Locations Pain Location: Pain Location: Pain  in Ulcers With  Dressing Change: Yes Duration of the Pain. Constant / Intermittento Intermittent Rate the pain. Current Pain Level: 2 Worst Pain Level: 7 Least Pain Level: 0 Character of Pain Describe the Pain: Aching Pain Management and Medication Current Pain Management: Other: change dressing Is the Current Pain Management Adequate: Adequate How does your wound impact your activities of daily livingo Sleep: No Bathing: No Appetite: No Relationship With Others: No Bladder Continence: No Emotions: No Bowel Continence: No Work: No Toileting: No Drive: No Dressing: No Hobbies: No Psychologist, prison and probation services) Signed: 09/20/2020 5:10:31 PM By: Zenaida Deed RN, BSN Entered By: Zenaida Deed on 09/19/2020 14:15:05 -------------------------------------------------------------------------------- Patient/Caregiver Education Details Patient Name: Date of Service: PA Adora Fridge 4/21/2022andnbsp1:45 PM Medical Record Number: [id number] Patient Account Number: [id number] Date of Birth/Gender: Treating RN: 24-Jan-1946 (75 y.o. Tammy Sours Primary Care Physician: Lavonda Jumbo Other Clinician: Referring Physician: Treating Physician/Extender: Loletta Parish in Treatment: 1 Education Assessment Education Provided To: Patient Education Topics Provided Wound/Skin Impairment: Handouts: Skin Care Do's and Dont's Methods: Explain/Verbal Responses: Reinforcements needed Electronic Signature(s) Signed: 09/19/2020 5:27:07 PM By: Shawn Stall Entered By: Shawn Stall on 09/19/2020 13:30:40 -------------------------------------------------------------------------------- Wound Assessment Details Patient Name: Date of Service: PA Adora Fridge 09/19/2020 1:45 PM Medical Record Number: [id number] Patient Account Number: [id number] Date of Birth/Sex: Treating RN: 1945/07/09 (75 y.o. Damaris Schooner Primary Care Aariona Momon: Lavonda Jumbo Other  Clinician: Referring Christian Treadway: Treating Hondo Nanda/Extender: Loletta Parish in Treatment: 1 Wound Status Wound Number: 1 Primary Etiology: Malignant Wound Wound Location: Left Forearm Wound Status: Open Wounding Event: Other Lesion Comorbid History: Chronic Obstructive Pulmonary Disease (COPD) Date Acquired: 09/03/2015 Weeks Of Treatment: 1 Clustered Wound: No Photos Wound Measurements Length: (cm) 3.2 Width: (cm) 2.8 Depth: (cm) 0.1 Area: (cm) 7.037 Volume: (cm) 0.704 % Reduction in Area: -28% % Reduction in Volume: 36% Epithelialization: None Tunneling: No Undermining: No Wound Description Classification: Full Thickness Without Exposed Support Structures Wound Margin: Distinct, outline attached Exudate Amount: Medium Exudate Type: Sanguinous Exudate Color: red Foul Odor After Cleansing: No Slough/Fibrino No Wound Bed Granulation Amount: Large (67-100%) Exposed Structure Granulation Quality: Pink, Hyper-granulation, Friable Fascia Exposed: No Necrotic Amount: None Present (0%) Fat Layer (Subcutaneous Tissue) Exposed: Yes Tendon Exposed: No Muscle Exposed: No Joint Exposed: No Bone Exposed: No Treatment Notes Wound #1 (Forearm) Wound Laterality: Left Cleanser Soap and Water Discharge Instruction: May shower and wash wound with dial antibacterial soap and water prior to dressing change. Peri-Wound Care Topical Primary Dressing KerraCel Ag Gelling Fiber Dressing, 4x5 in (silver alginate) Discharge Instruction: Apply silver alginate to wound bed as instructed Secondary Dressing Woven Gauze Sponge, Non-Sterile 4x4 in Discharge Instruction: Apply over primary dressing as directed. Secured With Elastic Bandage 4 inch (ACE bandage) Discharge Instruction: Secure with ACE bandage as directed. Conforming Stretch Gauze Bandage, Sterile 2x75 (in/in) Discharge Instruction: Secure with stretch gauze as directed. Compression Wrap Compression  Stockings Add-Ons Electronic Signature(s) Signed: 09/20/2020 5:10:31 PM By: Zenaida Deed RN, BSN Signed: 09/24/2020 2:09:33 PM By: Karl Ito Entered By: Karl Ito on 09/19/2020 16:43:02 -------------------------------------------------------------------------------- Wound Assessment Details Patient Name: Date of Service: PA Adora Fridge 09/19/2020 1:45 PM Medical Record Number: [id number] Patient Account Number: [id number] Date of Birth/Sex: Treating RN: Sep 05, 1945 (76 y.o. Damaris Schooner Primary Care Darilyn Storbeck: Lavonda Jumbo Other Clinician: Referring Cheyla Duchemin: Treating Lajuanda Penick/Extender: Loletta Parish in Treatment: 1 Wound Status Wound Number: 2 Primary Etiology: Malignant Wound Wound Location: Left Wrist Wound Status: Open  Wounding Event: Other Lesion Comorbid History: Chronic Obstructive Pulmonary Disease (COPD) Date Acquired: 09/03/2015 Weeks Of Treatment: 1 Clustered Wound: No Photos Wound Measurements Length: (cm) 1 Width: (cm) 5.4 Depth: (cm) 0.3 Area: (cm) 4.241 Volume: (cm) 1.272 % Reduction in Area: 20.6% % Reduction in Volume: 20.6% Epithelialization: Small (1-33%) Tunneling: No Undermining: No Wound Description Classification: Full Thickness Without Exposed Support Structures Wound Margin: Well defined, not attached Exudate Amount: Medium Exudate Type: Sanguinous Exudate Color: red Foul Odor After Cleansing: No Slough/Fibrino No Wound Bed Granulation Amount: Large (67-100%) Exposed Structure Granulation Quality: Red, Pink, Friable Fascia Exposed: No Necrotic Amount: Small (1-33%) Fat Layer (Subcutaneous Tissue) Exposed: Yes Necrotic Quality: Eschar Tendon Exposed: No Muscle Exposed: No Joint Exposed: No Bone Exposed: No Treatment Notes Wound #2 (Wrist) Wound Laterality: Left Cleanser Soap and Water Discharge Instruction: May shower and wash wound with dial antibacterial soap and water  prior to dressing change. Peri-Wound Care Topical Primary Dressing KerraCel Ag Gelling Fiber Dressing, 4x5 in (silver alginate) Discharge Instruction: Apply silver alginate to wound bed as instructed Secondary Dressing Secured With Elastic Bandage 4 inch (ACE bandage) Discharge Instruction: Secure with ACE bandage as directed. Conforming Stretch Gauze Bandage, Sterile 2x75 (in/in) Discharge Instruction: Secure with stretch gauze as directed. Compression Wrap Compression Stockings Add-Ons Electronic Signature(s) Signed: 09/20/2020 5:10:31 PM By: Baruch Gouty RN, BSN Signed: 09/24/2020 2:09:33 PM By: Sandre Kitty Entered By: Sandre Kitty on 09/19/2020 16:43:17 -------------------------------------------------------------------------------- Vitals Details Patient Name: Date of Service: PA [name]. 09/19/2020 1:45 PM Medical Record Number: [id number] Patient Account Number: [id number] Date of Birth/Sex: Treating RN: 1945-11-02 (75 y.o. [name] Primary Care Laken Lobato: [name] Other Clinician: Referring Franke Menter: Treating Jamaria Amborn/Extender: [name] in Treatment: 1 Vital Signs Time Taken: 14:00 Temperature (F): 98.5 Height (in): 72 Pulse (bpm): 134 Source: Stated Respiratory Rate (breaths/min): 18 Weight (lbs): 165 Blood Pressure (mmHg): 109/72 Source: Stated Reference Range: 80 - 120 mg / dl Body Mass Index (BMI): 22.4 Electronic Signature(s) Signed: 09/20/2020 5:10:31 PM By: Baruch Gouty RN, BSN Signed: 09/20/2020 5:10:31 PM By: Baruch Gouty RN, BSN Entered By: Baruch Gouty on 09/19/2020 14:04:18

## 2020-09-26 ENCOUNTER — Ambulatory Visit: Payer: Medicare Other

## 2020-09-27 NOTE — Chronic Care Management (AMB) (Signed)
Care Management    RN Visit Note  09/27/2020 Name: [name] MRN: [id number] DOB: 1945/11/12  Subjective: [name] is a 75 y.o. year old male who is a primary care patient of [place], Shawn Plummer, DO. The care management team was consulted for assistance with disease management and care coordination needs.    Engaged with patient by telephone for follow up visit in response to provider referral for case management and/or care coordination services.   The patient was given information about Chronic Care Management services, agreed to services, and gave verbal consent prior to initiation of services.  Please see initial visit note for detailed documentation.    Patient agreed to services and consent obtained.    Assessment: Patient continues to experience difficulty with trying to quit smoking... See Care Plan below for interventions and patient self-care actives. Follow up Plan: Patient would like continued follow-up.  CCM RNCM will outreach the patient within the next 4 weeks.  Patient will call office if needed prior to next encounter  Review of patient past medical history, allergies, medications, health status, including review of consultants reports, laboratory and other test data, was performed as part of comprehensive evaluation and provision of chronic care management services.   SDOH (Social Determinants of Health) assessments and interventions performed:    Care Plan  Allergies  Allergen Reactions  . Orange Juice [Orange Oil] Hives    Outpatient Encounter Medications as of 09/26/2020  Medication Sig  . aspirin EC 81 MG tablet Take 81 mg by mouth daily. Swallow whole.  . furosemide (LASIX) 40 MG tablet Take 40 mg by mouth daily.  . Multiple Vitamin (MULTI-VITAMIN DAILY) TABS Take by mouth.  . NON FORMULARY Take 2 tablets by mouth daily. Patient states that he takes Bladder release that he purchases online   No facility-administered encounter medications on file  as of 09/26/2020.    Patient Active Problem List   Diagnosis Date Noted  . Basal cell carcinoma (BCC) of skin of left wrist 09/04/2020  . Acute bronchitis with COPD ([place]) 01/29/2020  . Skin lesions 01/29/2020  . 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 Hx of bacterial endocarditis 01/29/2020  . Frequent falls 01/29/2020  . H/O mitral valve replacement 01/29/2020    Conditions to be addressed/monitored: COPD  Care Plan : RN Care Manager  Updates made by Lazaro Arms, RN since 09/27/2020 12:00 AM  Problem: COPD   Priority: High  Onset Date: 02/08/2020  Goal: COPD Management   Start Date: 04/19/2020  Expected End Date: 10/29/2020  Current Barriers:   Knowledge deficit related to basic COPD self care/management- the patient stated that he just moved here and he lives with his son and his girl friend.  He is unable to walk ling distances without being short of breath or doing a lot of thing with out being short of breath.  Where he lived he said he had an inhaler that was given to him for free but he lost it and does not remember the name.  He states that he currently smokes but he says smoking his tobacco pipe is cutting down for him.   Knowledge deficit related to basic understanding of how to use inhalers and how inhaled medications work  Knowledge deficit related to importance of energy conservation   Nurse Case Manager Clinical Goal(s):  Over the next 30 days patient will report utilizing pursed lip breathing for shortness of breath  Over the next 30 days, patient will be able  to verbalize understanding of COPD action plan and when to seek appropriate levels of medical care   Interventions:   Provided patient with basic verbal COPD education on self care/management/and exacerbation prevention   Provided patient with COPD action plan and reinforced importance of daily self assessment  Will mail the patient  written instructions on pursed lip breathing and COPD-    He reports that he went to  his appointment at the hospital. He wants to continue to go to his appointment. Dicussed with him about his smoking and how it affects the healing process. He stated that he knows about that information. Discussed with the patient on smoking cessation and how it affects his breathing.  He states that he breathing has been fine. Patient reports that he is still smoking his pipe.  Advised the patient to call the office and schedule and appointment for a check up and possible smoking cessation. He declined. He thanked me for calling.   Patient Goals/Self Care Activities:  . Patient verbalizes understanding of plan .  Self-administers medications as prescribed . Calls pharmacy for medication refills . Call's provider office for new concerns or questions . Practice and use pursed lip breathing for shortness of breath recovery and prevention . Patient will try to cut down on his smoking . Patient will call transportation for appointments . Patient keep all appointments made      Lazaro Arms RN, BSN, [facility] Care Management Coordinator [place] Phone: [phone] I Fax: [phone]

## 2020-09-27 NOTE — Patient Instructions (Signed)
Visit Information  Mr. Gebhard  it was nice speaking with you. Please call me directly [phone] if you have questions about the goals we discussed.  Goals Addressed              This Visit's Progress   .  I can't walk far without having to stop. (pt-stated)        Timeframe:  Long-Range Goal Priority:  High Start Date:   9/921                          Expected End Date: 11/28/20   Patient Goals/Self Care Activities:  . Patient verbalizes understanding of plan .  Self-administers medications as prescribed . Calls pharmacy for medication refills . Call's provider office for new concerns or questions . Practice and use pursed lip breathing for shortness of breath recovery and prevention . Patient will try to cut down on his smoking . Follow up on information given him for medicaid and call care guides to help with transportation        The patient verbalized understanding of instructions, educational materials, and care plan provided today and declined offer to receive copy of patient instructions, educational materials, and care plan.   Follow up Plan: Patient would like continued follow-up.  CCM RNCM will outreach the patient within the next 4 weeks.  Patient will call office if needed prior to next encounter  Lazaro Arms, RN  [phone]

## 2020-10-03 ENCOUNTER — Encounter (HOSPITAL_BASED_OUTPATIENT_CLINIC_OR_DEPARTMENT_OTHER): Payer: Medicare Other | Admitting: Internal Medicine

## 2020-10-04 ENCOUNTER — Encounter (HOSPITAL_BASED_OUTPATIENT_CLINIC_OR_DEPARTMENT_OTHER): Payer: Medicare Other | Admitting: Internal Medicine

## 2020-10-17 ENCOUNTER — Ambulatory Visit (HOSPITAL_COMMUNITY)
Admission: RE | Admit: 2020-10-17 | Discharge: 2020-10-17 | Disposition: A | Discharge status: Home / Self Care | Payer: Medicare Other | Source: Ambulatory Visit | Attending: Internal Medicine | Admitting: Internal Medicine

## 2020-10-17 ENCOUNTER — Emergency Department (HOSPITAL_COMMUNITY): Payer: Medicare Other

## 2020-10-17 ENCOUNTER — Encounter (HOSPITAL_COMMUNITY): Payer: Self-pay

## 2020-10-17 ENCOUNTER — Other Ambulatory Visit: Payer: Self-pay

## 2020-10-17 ENCOUNTER — Encounter (HOSPITAL_COMMUNITY): Payer: Self-pay | Admitting: Pharmacy Technician

## 2020-10-17 ENCOUNTER — Inpatient Hospital Stay (HOSPITAL_COMMUNITY)
Admission: EM | Admit: 2020-10-17 | Discharge: 2020-11-01 | DRG: 981 | Disposition: A | Discharge status: Home Health | Payer: Medicare Other | Attending: Internal Medicine | Admitting: Internal Medicine

## 2020-10-17 DIAGNOSIS — Z7982 Long term (current) use of aspirin: Secondary | ICD-10-CM | POA: Diagnosis not present

## 2020-10-17 DIAGNOSIS — D62 Acute posthemorrhagic anemia: Secondary | ICD-10-CM | POA: Diagnosis not present

## 2020-10-17 DIAGNOSIS — E1165 Type 2 diabetes mellitus with hyperglycemia: Secondary | ICD-10-CM | POA: Diagnosis present

## 2020-10-17 DIAGNOSIS — I10 Essential (primary) hypertension: Secondary | ICD-10-CM

## 2020-10-17 DIAGNOSIS — D696 Thrombocytopenia, unspecified: Secondary | ICD-10-CM | POA: Diagnosis present

## 2020-10-17 DIAGNOSIS — D500 Iron deficiency anemia secondary to blood loss (chronic): Secondary | ICD-10-CM

## 2020-10-17 DIAGNOSIS — C189 Malignant neoplasm of colon, unspecified: Secondary | ICD-10-CM

## 2020-10-17 DIAGNOSIS — I480 Paroxysmal atrial fibrillation: Secondary | ICD-10-CM | POA: Diagnosis present

## 2020-10-17 DIAGNOSIS — E875 Hyperkalemia: Secondary | ICD-10-CM | POA: Diagnosis present

## 2020-10-17 DIAGNOSIS — D509 Iron deficiency anemia, unspecified: Secondary | ICD-10-CM

## 2020-10-17 DIAGNOSIS — R195 Other fecal abnormalities: Secondary | ICD-10-CM

## 2020-10-17 DIAGNOSIS — F17210 Nicotine dependence, cigarettes, uncomplicated: Secondary | ICD-10-CM | POA: Diagnosis present

## 2020-10-17 DIAGNOSIS — Z20822 Contact with and (suspected) exposure to covid-19: Secondary | ICD-10-CM | POA: Diagnosis present

## 2020-10-17 DIAGNOSIS — Z8679 Personal history of other diseases of the circulatory system: Secondary | ICD-10-CM

## 2020-10-17 DIAGNOSIS — I4891 Unspecified atrial fibrillation: Secondary | ICD-10-CM | POA: Diagnosis present

## 2020-10-17 DIAGNOSIS — K567 Ileus, unspecified: Secondary | ICD-10-CM | POA: Diagnosis not present

## 2020-10-17 DIAGNOSIS — E43 Unspecified severe protein-calorie malnutrition: Secondary | ICD-10-CM | POA: Diagnosis present

## 2020-10-17 DIAGNOSIS — Z86718 Personal history of other venous thrombosis and embolism: Secondary | ICD-10-CM | POA: Diagnosis not present

## 2020-10-17 DIAGNOSIS — F0390 Unspecified dementia without behavioral disturbance: Secondary | ICD-10-CM | POA: Diagnosis present

## 2020-10-17 DIAGNOSIS — N179 Acute kidney failure, unspecified: Secondary | ICD-10-CM | POA: Diagnosis not present

## 2020-10-17 DIAGNOSIS — K6389 Other specified diseases of intestine: Secondary | ICD-10-CM | POA: Diagnosis not present

## 2020-10-17 DIAGNOSIS — C187 Malignant neoplasm of sigmoid colon: Secondary | ICD-10-CM | POA: Diagnosis present

## 2020-10-17 DIAGNOSIS — F17290 Nicotine dependence, other tobacco product, uncomplicated: Secondary | ICD-10-CM | POA: Diagnosis present

## 2020-10-17 DIAGNOSIS — Z953 Presence of xenogenic heart valve: Secondary | ICD-10-CM

## 2020-10-17 DIAGNOSIS — R009 Unspecified abnormalities of heart beat: Secondary | ICD-10-CM

## 2020-10-17 DIAGNOSIS — D490 Neoplasm of unspecified behavior of digestive system: Secondary | ICD-10-CM | POA: Diagnosis not present

## 2020-10-17 DIAGNOSIS — Z951 Presence of aortocoronary bypass graft: Secondary | ICD-10-CM

## 2020-10-17 DIAGNOSIS — Z9889 Other specified postprocedural states: Secondary | ICD-10-CM

## 2020-10-17 DIAGNOSIS — Z419 Encounter for procedure for purposes other than remedying health state, unspecified: Secondary | ICD-10-CM

## 2020-10-17 DIAGNOSIS — D869 Sarcoidosis, unspecified: Secondary | ICD-10-CM | POA: Diagnosis present

## 2020-10-17 DIAGNOSIS — Z952 Presence of prosthetic heart valve: Secondary | ICD-10-CM

## 2020-10-17 DIAGNOSIS — Z85828 Personal history of other malignant neoplasm of skin: Secondary | ICD-10-CM

## 2020-10-17 DIAGNOSIS — K5669 Other partial intestinal obstruction: Secondary | ICD-10-CM | POA: Diagnosis not present

## 2020-10-17 DIAGNOSIS — N170 Acute kidney failure with tubular necrosis: Secondary | ICD-10-CM | POA: Diagnosis not present

## 2020-10-17 DIAGNOSIS — R066 Hiccough: Secondary | ICD-10-CM | POA: Diagnosis not present

## 2020-10-17 DIAGNOSIS — Z79899 Other long term (current) drug therapy: Secondary | ICD-10-CM

## 2020-10-17 DIAGNOSIS — K9189 Other postprocedural complications and disorders of digestive system: Secondary | ICD-10-CM

## 2020-10-17 DIAGNOSIS — I483 Typical atrial flutter: Secondary | ICD-10-CM | POA: Diagnosis not present

## 2020-10-17 DIAGNOSIS — I484 Atypical atrial flutter: Principal | ICD-10-CM | POA: Diagnosis present

## 2020-10-17 DIAGNOSIS — K922 Gastrointestinal hemorrhage, unspecified: Secondary | ICD-10-CM | POA: Diagnosis not present

## 2020-10-17 DIAGNOSIS — I4430 Unspecified atrioventricular block: Secondary | ICD-10-CM | POA: Diagnosis present

## 2020-10-17 DIAGNOSIS — R9431 Abnormal electrocardiogram [ECG] [EKG]: Secondary | ICD-10-CM | POA: Diagnosis not present

## 2020-10-17 DIAGNOSIS — C44619 Basal cell carcinoma of skin of left upper limb, including shoulder: Secondary | ICD-10-CM | POA: Diagnosis present

## 2020-10-17 DIAGNOSIS — I4892 Unspecified atrial flutter: Secondary | ICD-10-CM | POA: Diagnosis present

## 2020-10-17 DIAGNOSIS — I959 Hypotension, unspecified: Secondary | ICD-10-CM | POA: Diagnosis present

## 2020-10-17 DIAGNOSIS — J440 Chronic obstructive pulmonary disease with acute lower respiratory infection: Secondary | ICD-10-CM | POA: Diagnosis present

## 2020-10-17 DIAGNOSIS — N400 Enlarged prostate without lower urinary tract symptoms: Secondary | ICD-10-CM | POA: Diagnosis not present

## 2020-10-17 DIAGNOSIS — Z6820 Body mass index (BMI) 20.0-20.9, adult: Secondary | ICD-10-CM

## 2020-10-17 DIAGNOSIS — J449 Chronic obstructive pulmonary disease, unspecified: Secondary | ICD-10-CM | POA: Diagnosis present

## 2020-10-17 DIAGNOSIS — N35919 Unspecified urethral stricture, male, unspecified site: Secondary | ICD-10-CM | POA: Diagnosis present

## 2020-10-17 DIAGNOSIS — Z8249 Family history of ischemic heart disease and other diseases of the circulatory system: Secondary | ICD-10-CM

## 2020-10-17 DIAGNOSIS — D630 Anemia in neoplastic disease: Secondary | ICD-10-CM | POA: Diagnosis not present

## 2020-10-17 HISTORY — DX: Essential (primary) hypertension: I10

## 2020-10-17 HISTORY — DX: Basal cell carcinoma of skin, unspecified: C44.91

## 2020-10-17 HISTORY — DX: Acute embolism and thrombosis of unspecified femoral vein: I82.419

## 2020-10-17 HISTORY — DX: Thrombocytopenia, unspecified: D69.6

## 2020-10-17 HISTORY — DX: Anemia, unspecified: D64.9

## 2020-10-17 HISTORY — DX: Chronic obstructive pulmonary disease, unspecified: J44.9

## 2020-10-17 HISTORY — DX: Unspecified atrial fibrillation: I48.91

## 2020-10-17 HISTORY — DX: Unspecified dementia, unspecified severity, without behavioral disturbance, psychotic disturbance, mood disturbance, and anxiety: F03.90

## 2020-10-17 HISTORY — DX: Type 2 diabetes mellitus without complications: E11.9

## 2020-10-17 HISTORY — DX: Unspecified atrial flutter: I48.92

## 2020-10-17 LAB — BASIC METABOLIC PANEL
Anion gap: 8 (ref 5–15)
BUN: 31 mg/dL — ABNORMAL HIGH (ref 8–23)
CO2: 26 mmol/L (ref 22–32)
Calcium: 8.7 mg/dL — ABNORMAL LOW (ref 8.9–10.3)
Chloride: 105 mmol/L (ref 98–111)
Creatinine, Ser: 1.13 mg/dL (ref 0.61–1.24)
GFR, Estimated: >60 mL/min (ref >60)
Glucose, Bld: 91 mg/dL (ref 70–99)
Potassium: 3.8 mmol/L (ref 3.5–5.1)
Sodium: 139 mmol/L (ref 135–145)

## 2020-10-17 LAB — CBC
HCT: 30.6 % — ABNORMAL LOW (ref 39.0–52.0)
Hemoglobin: 9.1 g/dL — ABNORMAL LOW (ref 13.0–17.0)
MCH: 22 pg — ABNORMAL LOW (ref 26.0–34.0)
MCHC: 29.7 g/dL — ABNORMAL LOW (ref 30.0–36.0)
MCV: 74.1 fL — ABNORMAL LOW (ref 80.0–100.0)
RBC: 4.13 MIL/uL — ABNORMAL LOW (ref 4.22–5.81)
RDW: 17.5 % — ABNORMAL HIGH (ref 11.5–15.5)
WBC: 9.9 10*3/uL (ref 4.0–10.5)
nRBC: 0 % (ref 0.0–0.2)

## 2020-10-17 LAB — TROPONIN I (HIGH SENSITIVITY)
Troponin I (High Sensitivity): 5 ng/L (ref <18)
Troponin I (High Sensitivity): 6 ng/L (ref <18)

## 2020-10-17 LAB — MAGNESIUM: Magnesium: 1.9 mg/dL (ref 1.7–2.4)

## 2020-10-17 MED ORDER — DILTIAZEM HCL ER COATED BEADS 120 MG PO CP24
240.0000 mg | ORAL_CAPSULE | Freq: Once | ORAL | Status: AC
  Administered 2020-10-17: 240 mg via ORAL
  Filled 2020-10-17: qty 2

## 2020-10-17 MED ORDER — HEPARIN BOLUS VIA INFUSION
3500.0000 [IU] | Freq: Once | INTRAVENOUS | Status: AC
  Administered 2020-10-17: 3500 [IU] via INTRAVENOUS
  Filled 2020-10-17: qty 3500

## 2020-10-17 MED ORDER — HEPARIN (PORCINE) 25000 UT/250ML-% IV SOLN: 12.0000 [IU]/kg/h | INTRAVENOUS | Status: DC

## 2020-10-17 MED ORDER — HEPARIN (PORCINE) 25000 UT/250ML-% IV SOLN
1700.0000 [IU]/h | INTRAVENOUS | Status: DC
  Administered 2020-10-17: 950 [IU]/h via INTRAVENOUS
  Administered 2020-10-18: 1300 [IU]/h via INTRAVENOUS
  Filled 2020-10-17 (×2): qty 250

## 2020-10-17 MED ORDER — SODIUM CHLORIDE 0.9 % IV BOLUS
250.0000 mL | Freq: Once | INTRAVENOUS | Status: AC
Start: 2020-10-17 — End: 2020-10-17
  Administered 2020-10-17: 250 mL via INTRAVENOUS

## 2020-10-17 MED ORDER — DIGOXIN 0.25 MG/ML IJ SOLN
0.2500 mg | Freq: Once | INTRAMUSCULAR | Status: AC
  Administered 2020-10-17: 0.25 mg via INTRAVENOUS
  Filled 2020-10-17: qty 2

## 2020-10-17 NOTE — Progress Notes (Signed)
ANTICOAGULATION CONSULT NOTE - Initial Consult  Pharmacy Consult for heparin Indication: atrial fibrillation  Allergies  Allergen Reactions  . Orange Juice [Orange Oil] Hives    Patient Measurements:   Heparin Dosing Weight: 75 KG  Vital Signs: Temp: 98.5 F (36.9 C) (05/19 1441) Temp Source: Oral (05/19 1441) BP: 88/68 (05/19 1900) Pulse Rate: 137 (05/19 1900)  Labs: Recent Labs    10/17/20 1446 10/17/20 1646  HGB 9.1*  --   HCT 30.6*  --   PLT ACLMP  --   CREATININE 1.13  --   TROPONINIHS 5 6    CrCl cannot be calculated (Unknown ideal weight.).   Medical History: History reviewed. No pertinent past medical history.  Medications:  NA  Assessment: 43 yom with hx of bacterial endocarditis s/p mitral valve replacement in 2018, DVT, DM, and atrial fibrillation. Post mitral valve replacement with medtronic 31 mm prosthetic heart valve in 2018, patient did not follow regularly with medical treatment. No anticoagulation PTA. Presented to ED today for ECHO, subsequently sent to ED for evaluation of tachycardia. Patient found to be in atrial flutter with rate of 142 on admission. Current Hgb 9.1 (BL~11), Scr 1.13, and Plt unable to evaluate. Cardiology wishes to pursue heparin anticoagulation to see if patient can tolerate prior to starting oral AC. Plan for TEE/DCCV during admission if tolerating AC. Of note, patient has open wounds 2/2 to basal cell carcinoma.   Goal of Therapy:  Heparin level 0.3-0.7 units/ml Monitor platelets by anticoagulation protocol: Yes   Plan:  Heparin 3500 units x1 followed by heparin 950 units/hr  8-hour HL   Daily HL and CBC Monitor closely for s/sx of bleed with recent drop in Hgb and open wounds   Cephus Slater, PharmD, [place] Resident [phone] 10/17/2020 8:12 PM

## 2020-10-17 NOTE — Consult Note (Addendum)
Cardiology Consultation:   Patient ID: [name] MRN: [id number]; DOB: 1946/01/19  Admit date: 10/17/2020 Date of Consult: 10/17/2020  PCP:  Merryl Hacker, No   [facility] Providers Cardiologist:  None        Patient Profile:   [name] is a 75 y.o. 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 Joya Gaskins.  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  He was admitted to [facility] in February 2018 with Streptococcus bacteremia and found to have anterior mitral valve leaflet vegetation.  Cardiac catheterization at that time showed no significant CAD.  He underwent mitral valve replacement with Medtronic Mosaic 31 mm porcine prosthetic heart valve on 07/14/2016.  Echocardiogram on 07/24/2016 showed EF 45 to 50%, normal functioning bioprosthetic mitral valve.  He moved from [place] to [place] in 2019 to live with his son and has not had regular medical follow-up since that time.  Presented for echocardiogram today and noted to be tachycardic to 140s.  Sent to ED for evaluation.  EKG shows 2-1 atrial flutter with rate 142.  Initial troponin 5, creatinine 1.1, potassium 3.8, hemoglobin 9.1 (down from 11.2 on 01/29/2020).  Initial vital signs showed BP 109/72, SPO2 100% on room air, pulse 146.  He was given 240 mg extended release diltiazem and BP fell to 81/65.  He denies any lightheadedness, syncope, chest pain.  Does report intermittent shortness of breath, which he attributes to his  COPD.   History reviewed. No pertinent past medical history.  History reviewed. No pertinent surgical history.    Inpatient Medications: Scheduled Meds:  Continuous Infusions:  PRN Meds:   Allergies:    Allergies  Allergen Reactions  . Orange Juice [Orange Oil] Hives    Social History:   Social History   Socioeconomic History  . Marital status: Single    Spouse name: Not on file  . Number of children: Not on file  . Years of education: Not on file  . Highest education level: Not on file  Occupational History  . Not on file  Tobacco Use  . Smoking status: Current Every Day Smoker    Years: 62.00    Types: Cigarettes, Pipe  . Smokeless tobacco: Never Used  Vaping Use  . Vaping Use: Never used  Substance and Sexual Activity  . Alcohol use: Not Currently    Comment: occasionally drinks beer 09/03/20  . Drug use: Not Currently    Comment: previous marijuana use  . Sexual activity: Not Currently  Other Topics Concern  . Not on file  Social History Narrative  . Not on file   Social Determinants of Health   Financial Resource Strain: Not on file  Food Insecurity: Not on file  Transportation Needs: Unmet Transportation Needs  . Lack of Transportation (Medical): Yes  . Lack of Transportation (Non-Medical): Yes  Physical Activity: Not on file  Stress: Not on file  Social Connections: Not on file  Intimate Partner Violence: Not on file    Family History:   No family history on file.   ROS:  Please see the history of present illness.   All other ROS reviewed and negative.     Physical Exam/Data:   Vitals:   10/17/20 1648 10/17/20 1744 10/17/20 1845 10/17/20 1900  BP: (!) 88/55 100/62 (!) 81/65 (!) 88/68  Pulse: (!) 143 (!) 139 (!) 138 (!) 137  Resp: 17 (!) 21 19 (!) 22  Temp:      TempSrc:      SpO2: 100% 99% 100% 100%   No intake or output data in the 24 hours ending 10/17/20 1918 Last 3 Weights 09/03/2020 01/29/2020 12/08/2019  Weight (lbs) 165 lb 6  oz 176 lb 175 lb  Weight (kg) 75.014 kg 79.833 kg 79.379 kg     There is no height or weight on file to calculate BMI.  General: Chronically ill-appearing, in no acute distress HEENT: normal Neck: no JVD Cardiac: Tachycardic, regular no murmur  Lungs: Diffuse expiratory wheezing Abd: soft, nontender Ext: no edema Musculoskeletal:  No deformities Skin: warm and dry  Neuro:  no focal abnormalities noted, oriented x3 Psych:  Normal affect    EKG:  The EKG was personally reviewed and demonstrates:   EKG shows 2-1 atrial flutter with rate 142 Telemetry:  Telemetry was personally reviewed and demonstrates: Atrial flutter with rate 130s to 140s  Relevant CV Studies:   Laboratory Data:  High Sensitivity Troponin:   Recent Labs  Lab 10/17/20 1446  TROPONINIHS 5     Chemistry Recent Labs  Lab 10/17/20 1446  NA 139  K 3.8  CL 105  CO2 26  GLUCOSE 91  BUN 31*  CREATININE 1.13  CALCIUM 8.7*  GFRNONAA >60  ANIONGAP 8    No results for input(s): PROT, ALBUMIN, AST, ALT, ALKPHOS, BILITOT in the last 168 hours. Hematology Recent Labs  Lab 10/17/20 1446  WBC 9.9  RBC 4.13*  HGB 9.1*  HCT 30.6*  MCV 74.1*  MCH 22.0*  MCHC 29.7*  RDW 17.5*  PLT ACLMP   BNPNo results for input(s): BNP, PROBNP in the last 168 hours.  DDimer No results for input(s): DDIMER in the last 168 hours.   Radiology/Studies:  DG Chest 2 View  Result Date: 10/17/2020 CLINICAL DATA:  Chest pain, shortness of breath, extreme fatigue EXAM: CHEST - 2 VIEW COMPARISON:  None. FINDINGS: Cardiomediastinal silhouette and pulmonary vasculature are within normal limits. Median sternotomy changes are seen. The third wire is fractured. Prosthetic valve is seen. Lungs are hyperexpanded, but otherwise clear clear. IMPRESSION: No acute cardiopulmonary process. Electronically Signed   By: Miachel Roux M.D.   On: 10/17/2020 15:46     Assessment and Plan:   Atrial flutter with RVR: Had atrial fibrillation  following mitral valve replacement in 2018 at [facility], but has had poor follow-up since that time.  Presented for echocardiogram today and found to be in a flutter with RVR to 140s. -He was given diltiazem extended release 240 mg in the ED which dropped his BP to 80s over 60s.  If becoming hemodynamically unstable would need emergent cardioversion, but currently appears asymptomatic despite soft pressures.  Would avoid diltiazem given hypotension and we do not know his systolic function.  Beta-blockers not a good choice either given active wheezing on exam and hypotension.  Would favor avoiding amiodarone for now, given risk of chemical cardioversion in someone who has not been on anticoagulation and unclear duration of aflutter.  Digoxin may be only good option at this point.  Discussed with pharmacy, recommended giving dose of IV  0.25 mg and monitoring response, if good response can start 0.125 mg daily.  Ultimately may be difficult to rate control given aflutter and will likely need rhythm control strategy with cardioversion -Hemoglobin is 9.1, down from 11.2 in August.  He has open wounds from basal cell carcinoma on his left arm and reports he has been having bleeding from these, though no active bleeding.  Given this, would recommend starting with heparin drip instead of DOAC to ensure he can tolerate anticoagulation.  If tolerating anticoagulation, would plan for TEE/DCCV to restore sinus rhythm -Can check echocardiogram once rates better controlled  Status post mitral valve replacement: underwent mitral valve replacement with Medtronic Mosaic 31 mm porcine prosthetic heart valve on 07/14/2016 for infective endocarditis.  Has not had cardiology follow-up.   -He appears euvolemic on exam, CXR unremarkable, can continue home PO lasix -Plan for echocardiogram once rates better controlled  COPD: Active wheezing on exam.  Treatment per primary team    For questions or updates, please contact [place] Please consult [email or web address] for contact info under    Signed, Donato Heinz, MD  10/17/2020 7:18 PM

## 2020-10-17 NOTE — ED Provider Notes (Signed)
Emergency Medicine Provider Triage Evaluation Note  [name] , a 75 y.o. male  was evaluated in triage.  Pt complains of tachycardia.  Patient was having an echo performed, techs noticed that his rate was in the 150s, captured EKG and sent him here.  Patient has a history of COPD and states that he has been more short of breath and at his baseline.  No history of arrhythmias.  He has no chest pain at this time.  No dizziness or syncope  Review of Systems  Positive: As above Negative: As above  Physical Exam  BP 109/72 (BP Location: Left Arm)   Pulse (!) 146   Temp 98.5 F (36.9 C) (Oral)   Resp 17   SpO2 100%  Gen:   Awake, no distress   Resp:  Normal effort  MSK:   Moves extremities without difficulty Other:  Ill-appearing, nondiaphoretic, rate tachycardic, EKG in the 140s  Medical Decision Making  Medically screening exam initiated at 2:45 PM.  Appropriate orders placed.  [name] was informed that the remainder of the evaluation will be completed by another provider, this initial triage assessment does not replace that evaluation, and the importance of remaining in the ED until their evaluation is complete.     Shawn Balding, PA-C 10/17/20 [place], DO 10/17/20 1623

## 2020-10-17 NOTE — ED Provider Notes (Signed)
[place] EMERGENCY DEPARTMENT Provider Note   CSN: [id number] Arrival date & time: 10/17/20  1436     History Chief Complaint  Patient presents with  . Abnormal ECG    [name] is a 75 y.o. male.  [name] is here in the emergency department upon referral from the echocardiogram lab.  He was scheduled to have an echocardiogram, and it was noted that his heart rate was in the 140s.  The patient states that he is not having any symptoms.  He was unaware that he had tachycardia, and he is not having any chest pain or chest discomfort.  Timing of the elevated heart rate is unknown, and it has been constant since it was detected this morning.  Chart review reveals a note from 2018 from [facility] cardiology.  He had a prosthetic mitral valve replacement and was noted to have atrial fibrillation at that time.  I do not see other cardiology notes after this point.  The history is provided by the patient.       History reviewed. No pertinent past medical history.  Patient Active Problem List   Diagnosis Date Noted  . Basal cell carcinoma (BCC) of skin of left wrist 09/04/2020  . Acute bronchitis with COPD ([place]) 01/29/2020  . Skin lesions 01/29/2020  . 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 Hx of bacterial endocarditis 01/29/2020  . Frequent falls 01/29/2020  . H/O mitral valve replacement 01/29/2020    History reviewed. No pertinent surgical history.     No family history on file.  Social History   Tobacco Use  . Smoking status: Current Every Day Smoker    Years: 62.00    Types: Cigarettes, Pipe  . Smokeless tobacco: Never Used  Vaping Use  . Vaping Use: Never used  Substance Use Topics  . Alcohol use: Not Currently    Comment: occasionally drinks beer 09/03/20  . Drug use: Not Currently    Comment: previous marijuana use    Home Medications Prior to Admission medications   Medication Sig Start Date End Date Taking? Authorizing Provider   aspirin EC 81 MG tablet Take 81 mg by mouth daily. Swallow whole.    [provider]  furosemide (LASIX) 40 MG tablet Take 40 mg by mouth daily. 08/08/16   [provider]  Multiple Vitamin (MULTI-VITAMIN DAILY) TABS Take by mouth.    [provider]  NON FORMULARY Take 2 tablets by mouth daily. Patient states that he takes Bladder release that he purchases online    [provider]    Allergies    Orange juice Haig Prophet oil]  Review of Systems   Review of Systems  Constitutional: Negative for chills and fever.  HENT: Negative for ear pain and sore throat.   Eyes: Negative for pain and visual disturbance.  Respiratory: Negative for cough and shortness of breath.   Cardiovascular: Negative for chest pain and palpitations.  Gastrointestinal: Negative for abdominal pain and vomiting.  Genitourinary: Negative for dysuria and hematuria.  Musculoskeletal: Negative for arthralgias and back pain.  Skin: Negative for color change and rash.  Neurological: Negative for seizures and syncope.  All other systems reviewed and are negative.   Physical Exam Updated Vital Signs BP 109/72 (BP Location: Left Arm)   Pulse (!) 146   Temp 98.5 F (36.9 C) (Oral)   Resp 17   SpO2 100%   Physical Exam Vitals and nursing note reviewed.  Constitutional:  Appearance: He is well-developed.  HENT:     Head: Normocephalic and atraumatic.     Mouth/Throat:     Mouth: Mucous membranes are moist.  Eyes:     Conjunctiva/sclera: Conjunctivae normal.  Cardiovascular:     Rate and Rhythm: Regular rhythm. Tachycardia present.     Heart sounds: No murmur heard.   Pulmonary:     Effort: Pulmonary effort is normal. No respiratory distress.     Breath sounds: Normal breath sounds.  Abdominal:     Palpations: Abdomen is soft.     Tenderness: There is no abdominal tenderness.  Musculoskeletal:     Cervical back: Neck supple.     Right lower leg: Edema present.      Left lower leg: Edema present.     Comments: To mid lower legs  Skin:    General: Skin is warm and dry.  Neurological:     General: No focal deficit present.     Mental Status: He is alert.     Sensory: No sensory deficit.     Motor: No weakness.  Psychiatric:        Mood and Affect: Mood normal.     ED Results / Procedures / Treatments   Labs (all labs ordered are listed, but only abnormal results are displayed) Labs Reviewed  BASIC METABOLIC PANEL - Abnormal; Notable for the following components:      Result Value   BUN 31 (*)    Calcium 8.7 (*)    All other components within normal limits  CBC - Abnormal; Notable for the following components:   RBC 4.13 (*)    Hemoglobin 9.1 (*)    HCT 30.6 (*)    MCV 74.1 (*)    MCH 22.0 (*)    MCHC 29.7 (*)    RDW 17.5 (*)    All other components within normal limits  SARS CORONAVIRUS 2 (TAT 6-24 HRS)  MAGNESIUM  TROPONIN I (HIGH SENSITIVITY)  TROPONIN I (HIGH SENSITIVITY)    EKG EKG Interpretation  Date/Time:  Thursday Oct 17 2020 14:43:01 EDT Ventricular Rate:  145 PR Interval:  166 QRS Duration: 86 QT Interval:  242 QTC Calculation: 375 R Axis:   67 Text Interpretation: Unusual P axis, possible ectopic atrial tachycardia vs atrial flutter Nonspecific ST and T wave abnormality Abnormal ECG Axis normal Confirmed by Lorre Munroe ([id number]) on 10/17/2020 4:24:15 PM   Radiology DG Chest 2 View  Result Date: 10/17/2020 CLINICAL DATA:  Chest pain, shortness of breath, extreme fatigue EXAM: CHEST - 2 VIEW COMPARISON:  None. FINDINGS: Cardiomediastinal silhouette and pulmonary vasculature are within normal limits. Median sternotomy changes are seen. The third wire is fractured. Prosthetic valve is seen. Lungs are hyperexpanded, but otherwise clear clear. IMPRESSION: No acute cardiopulmonary process. Electronically Signed   By: Miachel Roux M.D.   On: 10/17/2020 15:46    Procedures .Critical Care Performed by: Arnaldo Natal,  MD Authorized by: Arnaldo Natal, MD   Critical care provider statement:    Critical care time (minutes):  30   Critical care time was exclusive of:  Separately billable procedures and treating other patients and teaching time   Critical care was necessary to treat or prevent imminent or life-threatening deterioration of the following conditions:  Cardiac failure, circulatory failure and shock   Critical care was time spent personally by me on the following activities:  Discussions with consultants, evaluation of patient's response to treatment, examination of patient, ordering and performing treatments  and interventions, ordering and review of laboratory studies, ordering and review of radiographic studies, pulse oximetry, re-evaluation of patient's condition, obtaining history from patient or surrogate and review of old charts   I assumed direction of critical care for this patient from another provider in my specialty: no     Care discussed with: admitting provider       Medications Ordered in ED Medications  sodium chloride 0.9 % bolus 250 mL (has no administration in time range)  diltiazem (CARDIZEM CD) 24 hr capsule 240 mg (has no administration in time range)    ED Course  I have reviewed the triage vital signs and the nursing notes.  Pertinent labs & imaging results that were available during my care of the patient were reviewed by me and considered in my medical decision making (see chart for details).  Clinical Course as of 10/17/20 2001  Thu Oct 17, 2020  1910 Cardiology will come see the patient. [AW]  1942 Cardiology recommends the patient be started on a heparin drip for anticoagulation as he is quite anemic.  They would like to avoid an oral anticoagulation agent until the patient is deemed to be tolerating anticoagulation in general.  He has a history of multiple chronic medical conditions, and they would prefer the hospitalist team admit the patient.  Plan would be for TEE  and possible cardioversion. [AW]  2000 I spoke with [facility] who will admit the patient. [AW]    Clinical Course User Index [AW] Arnaldo Natal, MD   MDM Rules/Calculators/A&P                          [name] presented with elevated heart rate.  Rate was in the 130s to 140s with a rhythm of atrial flutter.  He was asymptomatic, but he did have some mild hypotension with mostly preserved mean arterial pressure.  It appears that he has been without regular medical care for several years, and he does have a history of atrial fibrillation noted very remotely in his medical chart from [facility].  He also has COPD, and at one point he was diagnosed with diabetes.  Due to his hemodynamic parameters, he will be admitted to the hospital with plans for TEE followed by cardioversion.  He will be started on a heparin drip, and he will be admitted to the hospital for further management. Final Clinical Impression(s) / ED Diagnoses Final diagnoses:  Atrial flutter, unspecified type ([place])  Chronic obstructive pulmonary disease, unspecified COPD type [facility])    Rx / DC Orders ED Discharge Orders    None       Arnaldo Natal, MD 10/17/20 2003

## 2020-10-17 NOTE — ED Notes (Signed)
MD Chotnier paged due to HR and BP. Patient will be upgraded to stepdown unit.

## 2020-10-17 NOTE — ED Notes (Signed)
[name] [phone]

## 2020-10-17 NOTE — ED Triage Notes (Signed)
Pt here from echo with reports of tachycardia and abnormal ecg. Pt denies any new complaints. HR 140 in triage.

## 2020-10-17 NOTE — H&P (Signed)
History and Physical        Hospital Admission Note Date: 10/17/2020  Patient name: [name] Medical record number: [id number] Date of birth: [birth year] Age: 76 y.o. Gender: male  PCP: Pcp, No    Patient coming from: Echo Lab   I have reviewed all records in the [facility].    Chief Complaint:  Tachycardia   HPI: [name] is 75 y.o. 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 Patient states that he was unaware HR was elevated and was asymptomatic. No chest pain or chest tightness. Endorses chronic SOB unchanged, attributes to COPD.    ED work-up/course:  [name] presented with elevated heart rate.  Rate was in the 130s to 140s with a rhythm of atrial flutter.  He was asymptomatic, but he did have some mild hypotension with mostly preserved mean arterial pressure.  It appears that he has been without regular medical care for several years, and he does have a history of atrial fibrillation noted very remotely in his medical chart from [facility].  He also has COPD, and at one point he was diagnosed with diabetes.  Due to his hemodynamic parameters, he will be admitted to the hospital with plans for TEE followed by cardioversion.  He will be started on a heparin drip, and he will be admitted to the hospital for further management.  Review of Systems: Positives marked in 'bold' Constitutional: Denies fever, chills, diaphoresis, poor appetite and fatigue.  HEENT: Denies photophobia, eye pain, redness, hearing loss, ear pain, congestion, sore throat, rhinorrhea, sneezing, mouth sores, trouble swallowing, neck pain, neck stiffness and tinnitus.   Respiratory: Denies SOB, DOE, cough, chest  tightness,  and wheezing.   Cardiovascular: Denies chest pain, palpitations and leg swelling.  Gastrointestinal: Denies nausea, vomiting, abdominal pain, diarrhea, constipation, blood in stool and abdominal distention.  Genitourinary: Denies dysuria, urgency, frequency, hematuria, flank pain and difficulty urinating.  Musculoskeletal: Denies myalgias, back pain, joint swelling, arthralgias and gait problem.  Skin: Denies pallor, rash and wound.  Neurological: Denies dizziness, seizures, syncope, weakness, light-headedness, numbness and headaches.  Hematological: Denies adenopathy. Easy bruising, personal or family bleeding history  Psychiatric/Behavioral: Denies suicidal ideation, mood changes, confusion, nervousness, sleep disturbance and agitation  Past Medical History: History reviewed. No pertinent past medical history.  History reviewed. No pertinent surgical history.  Medications: Prior to Admission medications   Medication Sig Start Date End Date Taking? Authorizing Provider  aspirin EC 81 MG tablet Take 81 mg by mouth daily. Swallow whole.   Yes [provider]  furosemide (LASIX) 40 MG tablet Take 40 mg by mouth daily. 08/08/16  Yes [provider]  Multiple Vitamin (MULTI-VITAMIN DAILY) TABS Take 1 tablet by mouth daily.   Yes [provider]    Allergies:   Allergies  Allergen Reactions  . Orange Juice [Orange Oil] Hives    Social History:  reports that he has been smoking cigarettes and pipe. He has smoked for  the past 62.00 years. He has never used smokeless tobacco. He reports previous alcohol use. He reports previous drug use.  Family History: No family history on file.  Physical Exam: Blood pressure 101/68, pulse (!) 136, temperature 98.5 F (36.9 C), temperature source Oral, resp. rate (!) 24, SpO2 100 %. General: Alert, awake, oriented x3, in no acute distress. Eyes: pink conjunctiva,anicteric sclera, pupils equal and reactive to  light and accomodation, HEENT: normocephalic, atraumatic, oropharynx clear Neck: supple, no masses or lymphadenopathy, no goiter, no bruits, no JVD CVS: Tachycardic, regular rhythm, without murmurs, rubs or gallops. No lower extremity edema Resp : Expiratory wheezing present diffusely. No tachypnea. No use of accessory muscles.  GI : Soft, nontender, nondistended, positive bowel sounds, no masses. No hepatomegaly. No hernia.  Musculoskeletal: No clubbing or cyanosis, positive pedal pulses. No contracture. ROM intact  Neuro: Grossly intact, no focal neurological deficits, strength 5/5 upper and lower extremities bilaterally Psych: alert and oriented x 3, normal mood and affect Skin: no rashes or lesions, warm and dry   LABS on Admission: I have personally reviewed all the labs and imagings below    Basic Metabolic Panel: Recent Labs  Lab 10/17/20 1446  NA 139  K 3.8  CL 105  CO2 26  GLUCOSE 91  BUN 31*  CREATININE 1.13  CALCIUM 8.7*  MG 1.9   Liver Function Tests: No results for input(s): AST, ALT, ALKPHOS, BILITOT, PROT, ALBUMIN in the last 168 hours. No results for input(s): LIPASE, AMYLASE in the last 168 hours. No results for input(s): AMMONIA in the last 168 hours. CBC: Recent Labs  Lab 10/17/20 1446  WBC 9.9  HGB 9.1*  HCT 30.6*  MCV 74.1*  PLT ACLMP   Cardiac Enzymes: No results for input(s): CKTOTAL, CKMB, CKMBINDEX, TROPONINI in the last 168 hours. BNP: Invalid input(s): POCBNP CBG: No results for input(s): GLUCAP in the last 168 hours.  Radiological Exams on Admission:  DG Chest 2 View  Result Date: 10/17/2020 CLINICAL DATA:  Chest pain, shortness of breath, extreme fatigue EXAM: CHEST - 2 VIEW COMPARISON:  None. FINDINGS: Cardiomediastinal silhouette and pulmonary vasculature are within normal limits. Median sternotomy changes are seen. The third wire is fractured. Prosthetic valve is seen. Lungs are hyperexpanded, but otherwise clear clear. IMPRESSION:  No acute cardiopulmonary process. Electronically Signed   By: Miachel Roux M.D.   On: 10/17/2020 15:46      EKG: Independently reviewed. Atrial flutter.    Assessment/Plan Active Problems:   Hx of bacterial endocarditis   H/O mitral valve replacement   Basal cell carcinoma (BCC) of skin of left wrist   Atrial flutter (HCC)   Microcytic anemia   COPD (chronic obstructive pulmonary disease) (HCC)  Atrial Flutter with RVR  History of Afib after MV replacement in 2018 at [facility]. Patient denies history of being on anticoagulation. He was given Diltiazem in ED and became hypotensive, asymptomatic. Digoxin was started by cardiology.  -admit to inpatient telemetry  -cardiology consulted, appreciate recs  -monitor HR, if good response plan is to continue Digoxin -heparin gtt started  -plan for TEE/DCCV eventually to restore NSR   Status Post Mitral Valve Replacement  Porcine prosthetic heart valve in place for h/o infectious endocarditis. Has not had cardiology f/u. CXR unremarkable. Euvolemic on exam.  -hold Lasix for now due to hypotension, resume when stable   Microcytic Anemia  HgB 9.1/MCV 74. Prior HgB 11.2 in Aug 2021. Appears to have h/o iron deficiency anemia in 2018. No  active bleeding from SCC wounds. Denies hematochezia/melena. Denies having had colonoscopy.  -monitor CBC -obtain iron studies  -will need outpatient colonoscopy for HM   COPD  Patient reports long term smoking history although smoking significantly less than usual due to living situation. Denies ever having been on inhaler. Wheezing present on exam. No hypoxia, respiratory distress, new cough that would suggest exacerbation.  -start LAMA (avoid LABA due to propensity to cause tachycardia) -Levalbuterol prn wheezing  -will likely need to establish with pulm for PFTs and further management of therapy   ?History of T2DM  ED provider mentioned concern for this. Patient denies h/o T2DM but does not appear  to be most reliable historian. Reviewed that had several elevated glucose levels in 2018 but unable to find A1c result. Glucose at admit 91.  -obtain A1c   DVT prophylaxis: Heparin gtt   CODE STATUS:   Consults called: Cardiology   Admission status: Inpatient   The medical decision making on this patient was of high complexity and the patient is at high risk for clinical deterioration, therefore this is a level 3 admission.  Severity of Illness:      The appropriate patient status for this patient is INPATIENT. Inpatient status is judged to be reasonable and necessary in order to provide the required intensity of service to ensure the patient's safety. The patient's presenting symptoms, physical exam findings, and initial radiographic and laboratory data in the context of their chronic comorbidities is felt to place them at high risk for further clinical deterioration. Furthermore, it is not anticipated that the patient will be medically stable for discharge from the hospital within 2 midnights of admission. The following factors support the patient status of inpatient.   " The patient's presenting symptoms include none. " The worrisome physical exam findings include tachycardia, wheezing. " The initial radiographic and laboratory data are worrisome because of atrial flutter. " The chronic co-morbidities include COPD, tobacco use, afib, mitral valve replacement.   * I certify that at the point of admission it is my clinical judgment that the patient will require inpatient hospital care spanning beyond 2 midnights from the point of admission due to high intensity of service, high risk for further deterioration and high frequency of surveillance required.*    Time Spent on Admission: 40 minutes     Melina Schools D.O.  [facility] 10/17/2020, 9:58 PM

## 2020-10-18 ENCOUNTER — Inpatient Hospital Stay (HOSPITAL_COMMUNITY): Payer: Medicare Other

## 2020-10-18 DIAGNOSIS — I483 Typical atrial flutter: Secondary | ICD-10-CM

## 2020-10-18 DIAGNOSIS — Z952 Presence of prosthetic heart valve: Secondary | ICD-10-CM | POA: Diagnosis not present

## 2020-10-18 LAB — CBC
HCT: 26.2 % — ABNORMAL LOW (ref 39.0–52.0)
Hemoglobin: 7.8 g/dL — ABNORMAL LOW (ref 13.0–17.0)
MCH: 22.2 pg — ABNORMAL LOW (ref 26.0–34.0)
MCHC: 29.8 g/dL — ABNORMAL LOW (ref 30.0–36.0)
MCV: 74.6 fL — ABNORMAL LOW (ref 80.0–100.0)
Platelets: 79 10*3/uL — ABNORMAL LOW (ref 150–400)
RBC: 3.51 MIL/uL — ABNORMAL LOW (ref 4.22–5.81)
RDW: 17.8 % — ABNORMAL HIGH (ref 11.5–15.5)
WBC: 8.4 10*3/uL (ref 4.0–10.5)
nRBC: 0 % (ref 0.0–0.2)

## 2020-10-18 LAB — BASIC METABOLIC PANEL
Anion gap: 7 (ref 5–15)
BUN: 30 mg/dL — ABNORMAL HIGH (ref 8–23)
CO2: 23 mmol/L (ref 22–32)
Calcium: 8 mg/dL — ABNORMAL LOW (ref 8.9–10.3)
Chloride: 104 mmol/L (ref 98–111)
Creatinine, Ser: 1.08 mg/dL (ref 0.61–1.24)
GFR, Estimated: >60 mL/min (ref >60)
Glucose, Bld: 88 mg/dL (ref 70–99)
Potassium: 4.2 mmol/L (ref 3.5–5.1)
Sodium: 134 mmol/L — ABNORMAL LOW (ref 135–145)

## 2020-10-18 LAB — SARS CORONAVIRUS 2 (TAT 6-24 HRS): SARS Coronavirus 2: NEGATIVE

## 2020-10-18 LAB — IRON AND TIBC
Iron: 16 ug/dL — ABNORMAL LOW (ref 45–182)
Saturation Ratios: 6 % — ABNORMAL LOW (ref 17.9–39.5)
TIBC: 283 ug/dL (ref 250–450)
UIBC: 267 ug/dL

## 2020-10-18 LAB — HEPARIN LEVEL (UNFRACTIONATED)
Heparin Unfractionated: <0.1 IU/mL — ABNORMAL LOW (ref 0.30–0.70)
Heparin Unfractionated: <0.1 IU/mL — ABNORMAL LOW (ref 0.30–0.70)

## 2020-10-18 LAB — MRSA PCR SCREENING: MRSA by PCR: NEGATIVE

## 2020-10-18 LAB — FERRITIN: Ferritin: 35 ng/mL (ref 24–336)

## 2020-10-18 LAB — HEMOGLOBIN AND HEMATOCRIT, BLOOD
HCT: 27.5 % — ABNORMAL LOW (ref 39.0–52.0)
Hemoglobin: 8.4 g/dL — ABNORMAL LOW (ref 13.0–17.0)

## 2020-10-18 LAB — HEMOGLOBIN A1C
Hgb A1c MFr Bld: 5.7 % — ABNORMAL HIGH (ref 4.8–5.6)
Mean Plasma Glucose: 116.89 mg/dL

## 2020-10-18 MED ORDER — DIGOXIN 0.25 MG/ML IJ SOLN
0.1250 mg | Freq: Four times a day (QID) | INTRAMUSCULAR | Status: AC
  Administered 2020-10-18 (×2): 0.125 mg via INTRAVENOUS
  Filled 2020-10-18: qty 2
  Filled 2020-10-18: qty 0.5

## 2020-10-18 MED ORDER — UMECLIDINIUM BROMIDE 62.5 MCG/INH IN AEPB
1.0000 | INHALATION_SPRAY | Freq: Every day | RESPIRATORY_TRACT | Status: DC
  Administered 2020-10-20 – 2020-11-01 (×9): 1 via RESPIRATORY_TRACT
  Filled 2020-10-18 (×3): qty 7

## 2020-10-18 MED ORDER — DIGOXIN 125 MCG PO TABS
0.1250 mg | ORAL_TABLET | Freq: Every day | ORAL | Status: DC
  Administered 2020-10-18 – 2020-10-27 (×10): 0.125 mg via ORAL
  Filled 2020-10-18 (×10): qty 1

## 2020-10-18 MED ORDER — DILTIAZEM HCL ER 60 MG PO CP12
60.0000 mg | ORAL_CAPSULE | Freq: Two times a day (BID) | ORAL | Status: AC
  Administered 2020-10-18 – 2020-10-21 (×7): 60 mg via ORAL
  Filled 2020-10-18 (×8): qty 1

## 2020-10-18 MED ORDER — LEVALBUTEROL HCL 0.63 MG/3ML IN NEBU
0.6300 mg | INHALATION_SOLUTION | Freq: Four times a day (QID) | RESPIRATORY_TRACT | Status: DC | PRN
  Administered 2020-10-31: 0.63 mg via RESPIRATORY_TRACT
  Filled 2020-10-18 (×2): qty 3

## 2020-10-18 NOTE — Progress Notes (Signed)
PROGRESS NOTE    [name]  XLK:[id number] DOB: [birth year] DOA: 10/17/2020 PCP: Pcp, No   Brief Narrative: This 75 years old male with PMH significant for bacterial endocarditis s/p mitral valve replacement in 2018, DVT, DM, HTN, dementia, tobacco use, COPD, atrial fibrillation, sarcoidosis, basal cell carcinoma who was transferred from echo lab to the ER with heart rate in 140s.  Patient denies any symptoms.  Patient states that he is unaware that his heart rate is elevated.  EKG shows atrial flutter with RVR.  Patient is also hypotensive.  He has not followed up with his regular doctor in several years.  Patient was seen by cardiologist and was started on digoxin because after getting Cardizem his blood pressure dropped. Cardiology recommended IV heparin and plans for TEE and DCCV.   Assessment & Plan:   Active Problems:   Hx of bacterial endocarditis   H/O mitral valve replacement   Basal cell carcinoma (BCC) of skin of left wrist   Atrial flutter (HCC)   Microcytic anemia   COPD (chronic obstructive pulmonary disease) (HCC)   Atrial fibrillation with RVR (HCC)   Atrial Flutter with RVR : Patient has hx. of Afib after MV replacement in 2018 at [facility].  Patient denies history of being on anticoagulation.  He was given Diltiazem in ED and became hypotensive, but remains asymptomatic.  Patient was seen by cardiology and was started on digoxin. monitor HR, if good response plan is to continue Digoxin Continue heparin gtt.,  monitor H&H. plan for TEE/DCCV eventually to restore NSR .  Status Post Mitral Valve Replacement  Porcine prosthetic heart valve in place for h/o infectious endocarditis.  He has not had cardiology f/u. CXR unremarkable. Euvolemic on exam.  Hold Lasix for now due to hypotension, resume when stable   Microcytic Anemia: Hgb 9.1/MCV 74. Prior HgB 11.2 in Aug 2021.  Appears to have h/o iron deficiency anemia in 2018.  No active bleeding from SCC  wounds.  Denies hematochezia/melena. Denies having had colonoscopy.  Monitor CBC -will need outpatient colonoscopy for HM   COPD: Patient reports long term smoking history . Denies ever having been on inhaler.  Wheezing present on exam. No hypoxia, respiratory distress, new cough that would suggest exacerbation.  Continue LAMA (avoid LABA due to propensity to cause tachycardia) -Levalbuterol prn wheezing  -will likely need to establish with pulm for PFTs and further management of therapy   Pre- Diabetes Hemoglobin A1c 5.7.   DVT prophylaxis:  Heparin iv Code Status: Full code. Family Communication: No family at bed side. Disposition Plan:   Status is: Inpatient  Remains inpatient appropriate because:Inpatient level of care appropriate due to severity of illness   Dispo: The patient is from: Home              Anticipated d/c is to: Home              Patient currently is not medically stable to d/c.   Difficult to place patient No   Consultants:   Cardiology  Procedures:  Antimicrobials:   Anti-infectives (From admission, onward)   None      Subjective: Patient was seen and examined at bedside,  He is lying comfortably on the bed,  denies any symptoms. He denies any chest pain.  Patient's heart rate is in 140s.  Objective: Vitals:   10/18/20 1422 10/18/20 1500 10/18/20 1548 10/18/20 1552  BP: 97/71 97/65  95/63  Pulse: (!) 132 (!) 131 (!) 113 [name])  128  Resp: 18 17 14 16   Temp: 98.1 F (36.7 C)   97.8 F (36.6 C)  TempSrc:    Oral  SpO2: 100% 95% 100% 92%  Weight:      Height:       No intake or output data in the 24 hours ending 10/18/20 1724 Filed Weights   10/18/20 0451  Weight: 81.2 kg    Examination:  General exam: Appears calm and comfortable, not in any acute distress. Respiratory system: Clear to auscultation. Respiratory effort normal. Cardiovascular system: S1 & S2 heard, RRR. No JVD, murmurs, rubs, gallops or clicks. No pedal  edema. Gastrointestinal system: Abdomen is nondistended, soft and nontender. No organomegaly or masses felt. Normal bowel sounds heard. Central nervous system: Alert and oriented. No focal neurological deficits. Extremities: Symmetric 5 x 5 power.  No edema, no cyanosis no clubbing. Skin: No rashes, lesions or ulcers Psychiatry: Judgement and insight appear normal. Mood & affect appropriate.     Data Reviewed: I have personally reviewed following labs and imaging studies  CBC: Recent Labs  Lab 10/17/20 1446 10/18/20 0336 10/18/20 0904  WBC 9.9 8.4  --   HGB 9.1* 7.8* 8.4*  HCT 30.6* 26.2* 27.5*  MCV 74.1* 74.6*  --   PLT ACLMP 79*  --    Basic Metabolic Panel: Recent Labs  Lab 10/17/20 1446 10/18/20 0336  NA 139 134*  K 3.8 4.2  CL 105 104  CO2 26 23  GLUCOSE 91 88  BUN 31* 30*  CREATININE 1.13 1.08  CALCIUM 8.7* 8.0*  MG 1.9  --    GFR: Estimated Creatinine Clearance: 63.9 mL/min (by C-G formula based on SCr of 1.08 mg/dL). Liver Function Tests: No results for input(s): AST, ALT, ALKPHOS, BILITOT, PROT, ALBUMIN in the last 168 hours. No results for input(s): LIPASE, AMYLASE in the last 168 hours. No results for input(s): AMMONIA in the last 168 hours. Coagulation Profile: No results for input(s): INR, PROTIME in the last 168 hours. Cardiac Enzymes: No results for input(s): CKTOTAL, CKMB, CKMBINDEX, TROPONINI in the last 168 hours. BNP (last 3 results) No results for input(s): PROBNP in the last 8760 hours. HbA1C: Recent Labs    10/18/20 0336  HGBA1C 5.7*   CBG: No results for input(s): GLUCAP in the last 168 hours. Lipid Profile: No results for input(s): CHOL, HDL, LDLCALC, TRIG, CHOLHDL, LDLDIRECT in the last 72 hours. Thyroid Function Tests: No results for input(s): TSH, T4TOTAL, FREET4, T3FREE, THYROIDAB in the last 72 hours. Anemia Panel: Recent Labs    10/18/20 0336  FERRITIN 35  TIBC 283  IRON 16*   Sepsis Labs: No results for input(s):  PROCALCITON, LATICACIDVEN in the last 168 hours.  Recent Results (from the past 240 hour(s))  SARS CORONAVIRUS 2 (TAT 6-24 HRS) Nasopharyngeal Nasopharyngeal Swab     Status: None   Collection Time: 10/17/20  8:02 PM   Specimen: Nasopharyngeal Swab  Result Value Ref Range Status   SARS Coronavirus 2 NEGATIVE NEGATIVE Final    Comment: (NOTE) SARS-CoV-2 target nucleic acids are NOT DETECTED.  The SARS-CoV-2 RNA is generally detectable in upper and lower respiratory specimens during the acute phase of infection. Negative results do not preclude SARS-CoV-2 infection, do not rule out co-infections with other pathogens, and should not be used as the sole basis for treatment or other patient management decisions. Negative results must be combined with clinical observations, patient history, and epidemiological information. The expected result is Negative.  Fact Sheet for Patients:  SugarRoll.be  Fact Sheet for Healthcare Providers: https://www.woods-mathews.com/  This test is not yet approved or cleared by the [place] FDA and  has been authorized for detection and/or diagnosis of SARS-CoV-2 by FDA under an Emergency Use Authorization (EUA). This EUA will remain  in effect (meaning this test can be used) for the duration of the COVID-19 declaration under Se ction 564(b)(1) of the Act, 21 U.S.C. section 360bbb-3(b)(1), unless the authorization is terminated or revoked sooner.  Performed at [facility], [place] [address] [place], [place] [address]   MRSA PCR Screening     Status: None   Collection Time: 10/18/20  2:15 PM   Specimen: Nasopharyngeal  Result Value Ref Range Status   MRSA by PCR NEGATIVE NEGATIVE Final    Comment:        The GeneXpert MRSA Assay (FDA approved for NASAL specimens only), is one component of a comprehensive MRSA colonization surveillance program. It is not intended to diagnose MRSA infection nor to  guide or monitor treatment for MRSA infections. Performed at [facility], [place] [address] [place], [place] [address]      Radiology Studies: DG Chest 2 View  Result Date: 10/17/2020 CLINICAL DATA:  Chest pain, shortness of breath, extreme fatigue EXAM: CHEST - 2 VIEW COMPARISON:  None. FINDINGS: Cardiomediastinal silhouette and pulmonary vasculature are within normal limits. Median sternotomy changes are seen. The third wire is fractured. Prosthetic valve is seen. Lungs are hyperexpanded, but otherwise clear clear. IMPRESSION: No acute cardiopulmonary process. Electronically Signed   By: Miachel Roux M.D.   On: 10/17/2020 15:46    Scheduled Meds: . digoxin  0.125 mg Oral Daily  . diltiazem  60 mg Oral Q12H  . umeclidinium bromide  1 puff Inhalation Daily   Continuous Infusions: . heparin 1,300 Units/hr (10/18/20 1456)     LOS: 1 day    Time spent: 35 mins    Labrina Lines, MD [facility]   If 7PM-7AM, please contact night-coverage

## 2020-10-18 NOTE — Progress Notes (Signed)
   10/18/20 1422  Assess: MEWS Score  Temp 98.1 F (36.7 C)  BP 97/71  Pulse Rate (!) 132  ECG Heart Rate (!) 132  Resp 18  Level of Consciousness Alert  SpO2 100 %  O2 Device Room Air  Assess: MEWS Score  MEWS Temp 0  MEWS Systolic 1  MEWS Pulse 3  MEWS RR 0  MEWS LOC 0  MEWS Score 4  MEWS Score Color Red  Assess: if the MEWS score is Yellow or Red  Were vital signs taken at a resting state? Yes  Focused Assessment No change from prior assessment  Early Detection of Sepsis Score *See Row Information* Medium  MEWS guidelines implemented *See Row Information* Yes  Treat  MEWS Interventions Administered scheduled meds/treatments  Pain Scale 0-10  Pain Score 0  Take Vital Signs  Increase Vital Sign Frequency  Red: Q 1hr X 4 then Q 4hr X 4, if remains red, continue Q 4hrs  Escalate  MEWS: Escalate Red: discuss with charge nurse/RN and provider, consider discussing with RRT  Notify: Charge Nurse/RN  Name of Charge Nurse/RN Notified Chat RN  Date Charge Nurse/RN Notified 10/18/20  Time Charge Nurse/RN Notified [id number]  Document  Progress note created (see row info) Yes  Patient admitted to [facility] for atrial flutter- HR in the 130s. MD aware. Pt receiving heparin IV and cardizem PO, with plan for cardioversion.

## 2020-10-18 NOTE — Progress Notes (Signed)
ANTICOAGULATION CONSULT NOTE -  Follow up Pharmacy Consult for heparin Indication: atrial fibrillation  Allergies  Allergen Reactions  . Orange Juice [Orange Oil] Hives      Patient Measurements: Height: 6' (182.9 cm) Weight: 81.2 kg (179 lb) IBW/kg (Calculated) : 77.6 Heparin Dosing Weight:  81.2 kg  Vital Signs: Temp: 98.1 F (36.7 C) (05/20 1422) Temp Source: Oral (05/20 1333) BP: 97/71 (05/20 1422) Pulse Rate: 132 (05/20 1422)  Labs: Recent Labs    10/17/20 1446 10/17/20 1646 10/18/20 0336 10/18/20 0904 10/18/20 1300  HGB 9.1*  --  7.8* 8.4*  --   HCT 30.6*  --  26.2* 27.5*  --   PLT ACLMP  --  79*  --   --   HEPARINUNFRC  --   --  <0.10*  --  <0.10*  CREATININE 1.13  --  1.08  --   --   TROPONINIHS 5 6  --   --   --     Estimated Creatinine Clearance: 63.9 mL/min (by C-G formula based on SCr of 1.08 mg/dL).   Medical History: History reviewed. No pertinent past medical history.  Medications:  NA  Assessment: 17 yom with hx of bacterial endocarditis s/p mitral valve replacement in 2018, DVT, DM, and atrial fibrillation. Post mitral valve replacement with medtronic 31 mm prosthetic heart valve in 2018, patient did not follow regularly with medical treatment. No anticoagulation PTA. Presented to ED today for ECHO, subsequently sent to ED for evaluation of tachycardia. Patient found to be in atrial flutter with rate of 142 on admission. Current Hgb 9.1 (BL~11), Scr 1.13, and Plt unable to evaluate. Cardiology wishes to pursue heparin anticoagulation to see if patient can tolerate prior to starting oral AC. Plan for TEE/DCCV during admission if tolerating AC. Of note, patient has open wounds 2/2 to basal cell carcinoma.   Heparin level remains undetectable despite increase in heparin gtt rate early this AM.  Per RN IV running okay except for pt bending arm frequently.  No overt bleeding or complications noted.   Goal of Therapy:  Heparin level 0.3-0.7  units/ml Monitor platelets by anticoagulation protocol: Yes   Plan:  Increase heparin to 1300 units/hr  8-hour HL   Daily HL and CBC Monitor closely for s/sx of bleed with recent drop in Hgb and open wounds   Marguerite Olea, BCCP Clinical Pharmacist  10/18/2020 2:54 PM   [facility] pharmacy phone numbers are listed on [email or web address]

## 2020-10-18 NOTE — Progress Notes (Signed)
Progress Note  Patient Name: [name] Date of Encounter: 10/18/2020  [facility] Cardiologist: None Dr. Gardiner Rhyme (new)  Subjective   Feeling well.  Minimal palpitations.  He has chronic shortness of breath which is unchanged from his baseline.  He attributes this to his COPD.  He denies any chest pain or pressure.  He is frustrated about being in the hospital and wants to go home.  Inpatient Medications    Scheduled Meds: . umeclidinium bromide  1 puff Inhalation Daily   Continuous Infusions: . heparin 1,100 Units/hr (10/18/20 0728)   PRN Meds: levalbuterol   Vital Signs    Vitals:   10/18/20 0645 10/18/20 0700 10/18/20 0715 10/18/20 0815  BP: 104/69 94/71 96/82  110/80  Pulse: (!) 131 (!) 131 (!) 131 (!) 131  Resp:   18 (!) 22  Temp:      TempSrc:      SpO2: 98% 99% 100% 99%  Weight:      Height:       No intake or output data in the 24 hours ending 10/18/20 0926 Last 3 Weights 10/18/2020 09/03/2020 01/29/2020  Weight (lbs) 179 lb 165 lb 6 oz 176 lb  Weight (kg) 81.194 kg 75.014 kg 79.833 kg      Telemetry    Atrial flutter.  Rate 120s.- Personally Reviewed  ECG    10/17/2020: Atrial flutter.  Rate 145 bpm. - Personally Reviewed  Physical Exam   VS:  BP 110/80   Pulse (!) 131   Temp 98.5 F (36.9 C) (Oral)   Resp (!) 22   Ht 6' (1.829 m)   Wt 81.2 kg   SpO2 99%   BMI 24.28 kg/m  , BMI Body mass index is 24.28 kg/m. GENERAL:  Well appearing.  No acute distress HEENT: Pupils equal round and reactive, fundi not visualized, oral mucosa unremarkable NECK:  No jugular venous distention, waveform within normal limits, carotid upstroke brisk and symmetric, no bruits LUNGS:  Clear to auscultation bilaterally HEART: Tachycardic.  Regular rhythm.  PMI not displaced or sustained,S1 and S2 within normal limits, no S3, no S4, no clicks, no rubs, no murmurs ABD:  Flat, positive bowel sounds normal in frequency in pitch, no bruits, no rebound, no guarding,  no midline pulsatile mass, no hepatomegaly, no splenomegaly EXT:  2 plus pulses throughout, no edema, no cyanosis no clubbing SKIN: Oozing skin cancer on left wrist NEURO:  Cranial nerves II through XII grossly intact, motor grossly intact throughout PSYCH:  Cognitively intact, oriented to person place and time   Labs    High Sensitivity Troponin:   Recent Labs  Lab 10/17/20 1446 10/17/20 1646  TROPONINIHS 5 6      Chemistry Recent Labs  Lab 10/17/20 1446 10/18/20 0336  NA 139 134*  K 3.8 4.2  CL 105 104  CO2 26 23  GLUCOSE 91 88  BUN 31* 30*  CREATININE 1.13 1.08  CALCIUM 8.7* 8.0*  GFRNONAA >60 >60  ANIONGAP 8 7     Hematology Recent Labs  Lab 10/17/20 1446 10/18/20 0336  WBC 9.9 8.4  RBC 4.13* 3.51*  HGB 9.1* 7.8*  HCT 30.6* 26.2*  MCV 74.1* 74.6*  MCH 22.0* 22.2*  MCHC 29.7* 29.8*  RDW 17.5* 17.8*  PLT ACLMP 79*    BNPNo results for input(s): BNP, PROBNP in the last 168 hours.   DDimer No results for input(s): DDIMER in the last 168 hours.   Radiology    DG Chest 2  View  Result Date: 10/17/2020 CLINICAL DATA:  Chest pain, shortness of breath, extreme fatigue EXAM: CHEST - 2 VIEW COMPARISON:  None. FINDINGS: Cardiomediastinal silhouette and pulmonary vasculature are within normal limits. Median sternotomy changes are seen. The third wire is fractured. Prosthetic valve is seen. Lungs are hyperexpanded, but otherwise clear clear. IMPRESSION: No acute cardiopulmonary process. Electronically Signed   By: Miachel Roux M.D.   On: 10/17/2020 15:46    Cardiac Studies   Echo pending  Patient Profile     75 y.o. 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  Assessment & Plan    #Atrial flutter: Rates remain poorly controlled.   Blood pressure is low and did not tolerate diltiazem.  He received 1 dose of digoxin.  We will continue to load him today with 2 doses of 0.125 mg then start oral digoxin tomorrow.  Continue IV heparin.  His hemoglobin dropped from 11 8 months ago to 9 on admission and 7.8 today.  No obvious source of bleeding.  He does have oozing from his basal cell carcinoma on his wrist.  However it has not been actively bleeding and does not account for his drop in his hemoglobin overnight.  We will repeat an H&H now.  We will not start oral anticoagulation until we have a better understanding of his anemia.  Labs are consistent with iron deficiency anemia.  Would wait for further clarification on this before pursuing TEE/cardioversion, as he will need to be on uninterrupted anticoagulation for a month afterwards.  For now we will continue with rate control.  He received diltiazem 240 mg yesterday evening.  We will reduce this to 60 mg every 12 hours to start this evening.  Titrate as blood pressure allows.  Echocardiogram is pending.  TSH was normal 12/2019.  We will order him a diet given that he will not be going for cardioversion today.  #Status post MVR: He has a history of bacterial endocarditis and had a bioprosthetic valve placed in 2018.  Echocardiogram is pending as above.  # Ongoing tobacco abuse:  Patient smokes a pipe.  Advise cessation.       For questions or updates, please contact [place] Please consult [email or web address] for contact info under        Signed, Skeet Latch, MD  10/18/2020, 9:26 AM

## 2020-10-18 NOTE — Progress Notes (Addendum)
ANTICOAGULATION CONSULT NOTE -  Follow up Pharmacy Consult for heparin Indication: atrial fibrillation  Allergies  Allergen Reactions  . Orange Juice [Orange Oil] Hives      Patient Measurements: Height: 6' (182.9 cm) Weight: 81.2 kg (179 lb) IBW/kg (Calculated) : 77.6 Heparin Dosing Weight:  81.2 kg  Vital Signs: BP: 103/69 (05/20 0400) Pulse Rate: 132 (05/20 0400)  Labs: Recent Labs    10/17/20 1446 10/17/20 1646 10/18/20 0336  HGB 9.1*  --  7.8*  HCT 30.6*  --  26.2*  PLT ACLMP  --  79*  HEPARINUNFRC  --   --  <0.10*  CREATININE 1.13  --  1.08  TROPONINIHS 5 6  --     CrCl cannot be calculated (Unknown ideal weight.).   Medical History: History reviewed. No pertinent past medical history.  Medications:  NA  Assessment: 53 yom with hx of bacterial endocarditis s/p mitral valve replacement in 2018, DVT, DM, and atrial fibrillation. Post mitral valve replacement with medtronic 31 mm prosthetic heart valve in 2018, patient did not follow regularly with medical treatment. No anticoagulation PTA. Presented to ED today for ECHO, subsequently sent to ED for evaluation of tachycardia. Patient found to be in atrial flutter with rate of 142 on admission. Current Hgb 9.1 (BL~11), Scr 1.13, and Plt unable to evaluate. Cardiology wishes to pursue heparin anticoagulation to see if patient can tolerate prior to starting oral AC. Plan for TEE/DCCV during admission if tolerating AC. Of note, patient has open wounds 2/2 to basal cell carcinoma.    The 8 hour heparin level = <0.1 subtherapeutic on heparin drip 950 units/hr  hgb 9.1 >7.8,  pltc 79k   No issues with Iinfusion, IV site good and no bleeding per RN'.   Goal of Therapy:  Heparin level 0.3-0.7 units/ml Monitor platelets by anticoagulation protocol: Yes   Plan:  Increase heparin to 1100 units/hr  8-hour HL   Daily HL and CBC Monitor closely for s/sx of bleed with recent drop in Hgb and open wounds    Nicole Cella,  RPh Clinical Pharmacist Please check AMION for all [place] phone numbers After 10:00 PM, call [place]  10/18/2020 4:47 AM

## 2020-10-19 ENCOUNTER — Inpatient Hospital Stay (HOSPITAL_COMMUNITY): Payer: Medicare Other

## 2020-10-19 ENCOUNTER — Encounter (HOSPITAL_COMMUNITY): Payer: Self-pay | Admitting: Internal Medicine

## 2020-10-19 DIAGNOSIS — I4891 Unspecified atrial fibrillation: Secondary | ICD-10-CM | POA: Diagnosis not present

## 2020-10-19 DIAGNOSIS — I4892 Unspecified atrial flutter: Secondary | ICD-10-CM | POA: Diagnosis not present

## 2020-10-19 DIAGNOSIS — D500 Iron deficiency anemia secondary to blood loss (chronic): Secondary | ICD-10-CM | POA: Diagnosis not present

## 2020-10-19 DIAGNOSIS — D509 Iron deficiency anemia, unspecified: Secondary | ICD-10-CM

## 2020-10-19 DIAGNOSIS — R9431 Abnormal electrocardiogram [ECG] [EKG]: Secondary | ICD-10-CM

## 2020-10-19 DIAGNOSIS — R195 Other fecal abnormalities: Secondary | ICD-10-CM | POA: Diagnosis not present

## 2020-10-19 DIAGNOSIS — I484 Atypical atrial flutter: Principal | ICD-10-CM

## 2020-10-19 DIAGNOSIS — J449 Chronic obstructive pulmonary disease, unspecified: Secondary | ICD-10-CM | POA: Diagnosis not present

## 2020-10-19 DIAGNOSIS — Z952 Presence of prosthetic heart valve: Secondary | ICD-10-CM | POA: Diagnosis not present

## 2020-10-19 LAB — CBC
HCT: 25.9 % — ABNORMAL LOW (ref 39.0–52.0)
Hemoglobin: 8 g/dL — ABNORMAL LOW (ref 13.0–17.0)
MCH: 22.3 pg — ABNORMAL LOW (ref 26.0–34.0)
MCHC: 30.9 g/dL (ref 30.0–36.0)
MCV: 72.1 fL — ABNORMAL LOW (ref 80.0–100.0)
Platelets: 68 10*3/uL — ABNORMAL LOW (ref 150–400)
RBC: 3.59 MIL/uL — ABNORMAL LOW (ref 4.22–5.81)
RDW: 17.2 % — ABNORMAL HIGH (ref 11.5–15.5)
WBC: 6.6 10*3/uL (ref 4.0–10.5)
nRBC: 0 % (ref 0.0–0.2)

## 2020-10-19 LAB — ECHOCARDIOGRAM COMPLETE
Height: 72 in
MV VTI: 2.32 cm2
S' Lateral: 2.4 cm
Weight: 2864 oz

## 2020-10-19 LAB — HEPARIN LEVEL (UNFRACTIONATED)
Heparin Unfractionated: 0.11 IU/mL — ABNORMAL LOW (ref 0.30–0.70)
Heparin Unfractionated: <0.1 IU/mL — ABNORMAL LOW (ref 0.30–0.70)
Heparin Unfractionated: <0.1 IU/mL — ABNORMAL LOW (ref 0.30–0.70)

## 2020-10-19 LAB — OCCULT BLOOD X 1 CARD TO LAB, STOOL: Fecal Occult Bld: POSITIVE — AB

## 2020-10-19 LAB — ANTITHROMBIN III: AntiThromb III Func: 67 % — ABNORMAL LOW (ref 75–120)

## 2020-10-19 MED ORDER — HEPARIN BOLUS VIA INFUSION
2500.0000 [IU] | Freq: Once | INTRAVENOUS | Status: AC
  Administered 2020-10-19: 2500 [IU] via INTRAVENOUS
  Filled 2020-10-19: qty 2500

## 2020-10-19 MED ORDER — SODIUM CHLORIDE 0.9 % IV SOLN
510.0000 mg | Freq: Once | INTRAVENOUS | Status: AC
  Administered 2020-10-19: 510 mg via INTRAVENOUS
  Filled 2020-10-19: qty 17

## 2020-10-19 MED ORDER — PANTOPRAZOLE SODIUM 40 MG PO TBEC
40.0000 mg | DELAYED_RELEASE_TABLET | Freq: Two times a day (BID) | ORAL | Status: DC
  Administered 2020-10-19 – 2020-10-21 (×5): 40 mg via ORAL
  Filled 2020-10-19 (×4): qty 1

## 2020-10-19 MED ORDER — APIXABAN 5 MG PO TABS: 5.0000 mg | ORAL_TABLET | Freq: Two times a day (BID) | ORAL | Status: DC

## 2020-10-19 MED ORDER — HEPARIN (PORCINE) 25000 UT/250ML-% IV SOLN
1950.0000 [IU]/h | INTRAVENOUS | Status: DC
  Administered 2020-10-19: 1700 [IU]/h via INTRAVENOUS
  Administered 2020-10-20: 1950 [IU]/h via INTRAVENOUS
  Administered 2020-10-20: 1700 [IU]/h via INTRAVENOUS
  Filled 2020-10-19 (×3): qty 250

## 2020-10-19 NOTE — Consult Note (Signed)
WOC Nurse Consult Note: Reason for Consult:Two chronic, nonhealing full thickness lesions noted to left upper extremity, record review notes that a biopsy performed in March of this year indicates basal carcinoma. Patient reports their presence for approximately 8 years. Wound type:neoplastic Pressure Injury POA: N/A Measurement:To be obtained by bedside RN today Wound bed:red, moist Drainage (amount, consistency, odor) small serous to serosanguinous Periwound: intact with raised pearlescent ridge in the immediate periwound areas Dressing procedure/placement/frequency: I will provide Nursing with conservative topical care guidance using soap and water to cleanse, rinse and dressing with folded xeroform gauze, an antimicrobial nonadherent. This is to be secured with Kerlix roll gauze and paper tape and changed daily.   POC communicated to Dr. Rodena Piety via [place].  [place] nursing team will not follow, but will remain available to this patient, the nursing and medical teams.  Please re-consult if needed. Thanks, Maudie Flakes, MSN, RN, [place], Arther Abbott  Pager# [phone]

## 2020-10-19 NOTE — Progress Notes (Signed)
  Echocardiogram 2D Echocardiogram with 3D has been performed.  Darlina Sicilian M 10/19/2020, 9:03 AM

## 2020-10-19 NOTE — Consult Note (Signed)
Consultation  Referring Provider:  [facility] Physician:  Pcp, No Primary Gastroenterologist:  none  Reason for Consultation: Anemia, heme positive, iron deficiency  HPI: [name] is a 75 y.o. male with history of bacterial endocarditis status post porcine mitral valve replacement 2018 done at [facility].  Patient also with history of atrial fibrillation, COPD, adult onset diabetes mellitus, prior history of DVT, hypertension, dementia. He was admitted on 10/17/2020 after he was seen in the echo lab for planned echocardiogram and it was noted that his heart rate was in the 140s.  He denied having any symptoms. He was found to be in atrial flutter with RVR and cardiology was consulted. He has been started on digoxin, has been on IV heparin.  Rate continued to be poorly controlled and tentative plans have been made for TEE and cardioversion on Monday, 10/21/2020.  Patient will need chronic anticoagulation after cardioversion He was started on Eliquis today, has received 1 dose  Patient noted to have hemoglobin of 11 about 8 months ago, hemoglobin was 9 on admission and is now down to 8.0.  Platelets 68. He has been documented heme positive. No overt melena noted. On 10/17/2020 hemoglobin 9.1/hematocrit 30.6 MCV 74/platelets 103 Ferritin 35/iron 16/sat 6/TIBC 283  He is to receive IV Feraheme today.  Patient is a poor historian, he does not think he has ever had endoscopy or colonoscopy.  He tells me there is nothing wrong with my stomach.  He denies seeing any blood however his nurse tells me that there was some bright red blood on the tissue with bowel movement today and dark clots in his stool in addition to areas of darker stool.  Patient denies any abdominal pain or discomfort, no changes in bowel habits, no heartburn or indigestion, no dysphagia.   Past Medical History:  Diagnosis Date  . Anemia   . Atrial fibrillation ([place])    post operative in 2018  .  Atrial flutter ([place])   . Bacteremia 2018  . Basal cell carcinoma   . COPD (chronic obstructive pulmonary disease) ([place])   . Dementia ([place])   . Diabetes ([place])   . DVT of deep femoral vein ([place])   . Hypertension   . Thrombocytopenia ([place])     Past Surgical History:  Procedure Laterality Date  . MITRAL VALVE REPLACEMENT  07/2016   [facility] for endocarditis    Prior to Admission medications   Medication Sig Start Date End Date Taking? Authorizing Provider  aspirin EC 81 MG tablet Take 81 mg by mouth daily. Swallow whole.   Yes [provider]  furosemide (LASIX) 40 MG tablet Take 40 mg by mouth daily. 08/08/16  Yes [provider]  Multiple Vitamin (MULTI-VITAMIN DAILY) TABS Take 1 tablet by mouth daily.   Yes [provider]    Current Facility-Administered Medications  Medication Dose Route Frequency Provider Last Rate Last Admin  . apixaban (ELIQUIS) tablet 5 mg  5 mg Oral BID Allred, James, MD      . digoxin (LANOXIN) tablet 0.125 mg  0.125 mg Oral Daily Skeet Latch, MD   0.125 mg at 10/19/20 0930  . diltiazem (CARDIZEM SR) 12 hr capsule 60 mg  60 mg Oral Q12H Skeet Latch, MD   60 mg at 10/19/20 0930  . levalbuterol (XOPENEX) nebulizer solution 0.63 mg  0.63 mg Inhalation Q6H PRN Nicolette Bang, DO      . pantoprazole (PROTONIX) EC tablet 40 mg  40 mg Oral BID Georgette Shell, MD      . umeclidinium bromide (INCRUSE ELLIPTA) 62.5 MCG/INH 1 puff  1 puff Inhalation Daily Nicolette Bang, DO        Allergies as of 10/17/2020 - Review Complete 10/17/2020  Allergen Reaction Noted  . Orange juice [orange oil] Hives 09/03/2020    Family History  Problem Relation Age of Onset  . Hypertension Other     Social History   Socioeconomic History  . Marital status: Single    Spouse name: Not on file  . Number of children: Not on file  . Years of education: Not on file  . Highest education level: Not on file   Occupational History  . Not on file  Tobacco Use  . Smoking status: Current Every Day Smoker    Years: 62.00    Types: Cigarettes, Pipe  . Smokeless tobacco: Never Used  Vaping Use  . Vaping Use: Never used  Substance and Sexual Activity  . Alcohol use: Not Currently    Comment: occasionally drinks beer 09/03/20  . Drug use: Not Currently    Comment: previous marijuana use  . Sexual activity: Not Currently  Other Topics Concern  . Not on file  Social History Narrative   moved from [place] to [place] in 2019 to live with his son    Social Determinants of Radio broadcast assistant Strain: Not on [employer] Insecurity: Not on file  Transportation Needs: Unmet Transportation Needs  . Lack of Transportation (Medical): Yes  . Lack of Transportation (Non-Medical): Yes  Physical Activity: Not on file  Stress: Not on file  Social Connections: Not on file  Intimate Partner Violence: Not on file    Review of Systems: Pertinent positive and negative review of systems were noted in the above HPI section.  All other review of systems was otherwise negative.  Physical Exam: Vital signs in last 24 hours: Temp:  [97.8 F (36.6 C)-98.8 F (37.1 C)] 97.8 F (36.6 C) (05/21 1055) Pulse Rate:  [113-137] 132 (05/21 1055) Resp:  [14-24] 21 (05/21 1055) BP: (80-105)/(61-73) 90/65 (05/21 1055) SpO2:  [90 %-100 %] 94 % (05/21 1055) Last BM Date: 10/18/20 General:   Alert,  Well-developed, thin, chronically ill-appearing elderly white male,  cooperative in NAD Head:  Normocephalic and atraumatic. Eyes:  Sclera clear, no icterus.   Conjunctiva pink. Ears:  Normal auditory acuity. Nose:  No deformity, discharge,  or lesions. Mouth:  No deformity or lesions.  Poor dentition Neck:  Supple; no masses or thyromegaly. Lungs:  Clear throughout to auscultation.   No wheezes, crackles, or rhonchi. Heart:  Regular rate and rhythm; no murmurs, clicks, rubs,  or gallops. Abdomen:   Soft,nontender, BS active,nonpalp mass or hsm.   Rectal: Not done, documented heme positive Msk:  Symmetrical without gross deformities. . Pulses:  Normal pulses noted. Extremities:  Without clubbing or edema. Neurologic:  Alert and  oriented x3, poor historian Skin:  Intact without significant lesions or rashes.. Psych:  Alert and cooperative.  Intake/Output from previous day: 05/20 0701 - 05/21 0700 In: 435.7 [P.O.:120; I.V.:315.7] Out: 475 [Urine:475] Intake/Output this shift: Total I/O In: -  Out: 250 [Urine:250]  Lab Results: Recent Labs    10/17/20 1446 10/18/20 0336 10/18/20 0904 10/19/20 0030  WBC 9.9 8.4  --  6.6  HGB 9.1* 7.8* 8.4* 8.0*  HCT 30.6* 26.2* 27.5* 25.9*  PLT ACLMP 79*  --  68*   BMET Recent  Labs    10/17/20 1446 10/18/20 0336  NA 139 134*  K 3.8 4.2  CL 105 104  CO2 26 23  GLUCOSE 91 88  BUN 31* 30*  CREATININE 1.13 1.08  CALCIUM 8.7* 8.0*   LFT No results for input(s): PROT, ALBUMIN, AST, ALT, ALKPHOS, BILITOT, BILIDIR, IBILI in the last 72 hours. PT/INR No results for input(s): LABPROT, INR in the last 72 hours. Hepatitis Panel No results for input(s): HEPBSAG, HCVAB, HEPAIGM, HEPBIGM in the last 72 hours.   IMPRESSION:  #2 75 year old white male, admitted with atrial flutter with RVR This has persisted though some improvement in rate today now in the 90s to 130s  Patient is on IV heparin and had 1 dose of Eliquis today Cardiology tentatively plans cardioversion and TEE Monday  #2 status post porcine mitral valve replacement for endocarditis 2018 #3 history of hypertension #4.  Dementia #5 microcytic anemia, iron deficiency and heme positive stool 1 g drift in hemoglobin since admission Etiology of anemia iron deficiency and heme positive stool is not clear, will need to rule out occult neoplasm, AVMs, peptic ulcer disease, chronic gastropathy.   PLAN: Patient will need EGD and colonoscopy for diagnostic purposes. He  received 1 dose of Eliquis today, please hold until endoscopic evaluation done.  In setting of persistent atrial flutter with RVR he is at high risk for complications with anesthesia and will need to discuss with anesthesia and cardiology prior to scheduling of procedures.  Perhaps cardioversion could be done first, maintain him on heparin and then have a heparin window early next week at which time he could be scheduled for EGD and colonoscopy.  Continue serial hemoglobins, transfuse for hemoglobin 7 or less Add PPI daily He is receiving IV Feraheme today, will need second dose in 1 week  GI will follow along   Eddy Termine EsterwoodPA-C  10/19/2020, 2:45 PM

## 2020-10-19 NOTE — Progress Notes (Signed)
ANTICOAGULATION CONSULT NOTE -  Follow up Pharmacy Consult for heparin Indication: atrial fibrillation  Allergies  Allergen Reactions  . Orange Juice [Orange Oil] Hives      Patient Measurements: Height: 6' (182.9 cm) Weight: 81.2 kg (179 lb) IBW/kg (Calculated) : 77.6 Heparin Dosing Weight:  81.2 kg  Vital Signs: Temp: 98.8 F (37.1 C) (05/20 2349) Temp Source: Oral (05/20 2349) BP: 94/70 (05/20 2349) Pulse Rate: 133 (05/20 2349)  Labs: Recent Labs    10/17/20 1446 10/17/20 1446 10/17/20 1646 10/18/20 0336 10/18/20 0904 10/18/20 1300 10/18/20 2331 10/19/20 0030  HGB 9.1*  --   --  7.8* 8.4*  --   --  8.0*  HCT 30.6*  --   --  26.2* 27.5*  --   --  25.9*  PLT ACLMP  --   --  79*  --   --   --  68*  HEPARINUNFRC  --    < >  --  <0.10*  --  <0.10* <0.10* <0.10*  CREATININE 1.13  --   --  1.08  --   --   --   --   TROPONINIHS 5  --  6  --   --   --   --   --    < > = values in this interval not displayed.    Estimated Creatinine Clearance: 63.9 mL/min (by C-G formula based on SCr of 1.08 mg/dL).   Medical History: History reviewed. No pertinent past medical history.  Medications:  NA  Assessment: 4 yom with hx of bacterial endocarditis s/p mitral valve replacement in 2018, DVT, DM, and atrial fibrillation. Post mitral valve replacement with medtronic 31 mm prosthetic heart valve in 2018, patient did not follow regularly with medical treatment. No anticoagulation PTA. Presented to ED today for ECHO, subsequently sent to ED for evaluation of tachycardia. Patient found to be in atrial flutter with rate of 142 on admission. Current Hgb 9.1 (BL~11), Scr 1.13, and Plt unable to evaluate. Cardiology wishes to pursue heparin anticoagulation to see if patient can tolerate prior to starting oral AC. Plan for TEE/DCCV during admission if tolerating AC. Of note, patient has open wounds 2/2 to basal cell carcinoma.    The 8 hour heparin level = <0.1 subtherapeutic on heparin  drip 1300 units/hr (~16 units/kg/hr)  hgb 9.1 >7.8>8.0,  pltc 79k>68k   No issues with Iinfusion, IV site good per RN's report. No bleeding noted   Goal of Therapy:  Heparin level 0.3-0.7 units/ml Monitor platelets by anticoagulation protocol: Yes   Plan:  Re-bolus 2500 unit x 1 and Increase heparin to 1550 units/hr  Check 8h HL Check antithrombin III.  Monitor closely for s/sx of bleed with recent drop in Hgb and open wounds    Nicole Cella, RPh Clinical Pharmacist Please check AMION for all [place] phone numbers After 10:00 PM, call [place]  10/19/2020 1:53 AM

## 2020-10-19 NOTE — Progress Notes (Signed)
   10/19/20 0752  Assess: MEWS Score  Temp 98.3 F (36.8 C)  BP (!) 80/62  Pulse Rate (!) 130  ECG Heart Rate (!) 130  Resp 19  Level of Consciousness Alert  SpO2 91 %  O2 Device Room Air  Assess: MEWS Score  MEWS Temp 0  MEWS Systolic 2  MEWS Pulse 3  MEWS RR 0  MEWS LOC 0  MEWS Score 5  MEWS Score Color Red  Assess: if the MEWS score is Yellow or Red  Were vital signs taken at a resting state? Yes  Focused Assessment No change from prior assessment  Early Detection of Sepsis Score *See Row Information* Low  MEWS guidelines implemented *See Row Information* Yes  Treat  MEWS Interventions Escalated (See documentation below)  Pain Scale 0-10  Pain Score 0  Take Vital Signs  Increase Vital Sign Frequency  Red: Q 1hr X 4 then Q 4hr X 4, if remains red, continue Q 4hrs  Escalate  MEWS: Escalate Red: discuss with charge nurse/RN and provider, consider discussing with RRT  Notify: Charge Nurse/RN  Name of Charge Nurse/RN Notified Tamara, RN  Date Charge Nurse/RN Notified 10/19/20  Time Charge Nurse/RN Notified 0803  Notify: Provider  Provider Name/Title Dr. Rayann Heman  Date Provider Notified 10/19/20  Time Provider Notified [phone]  Notification Type Page  Notification Reason Other (Comment) (RED MEWS)  Provider response En route  Date of Provider Response 10/19/20  Time of Provider Response [phone]

## 2020-10-19 NOTE — Progress Notes (Signed)
PROGRESS NOTE    [name]  WJX:[id number] DOB: 1946/03/03 DOA: 10/17/2020 PCP: Pcp, No   Brief Narrative:75 years old male with PMH significant for bacterial endocarditis s/p mitral valve replacement in 2018, DVT, DM, HTN, dementia, tobacco use, COPD, atrial fibrillation, sarcoidosis, basal cell carcinoma who was transferred from echo lab to the ER with heart rate in 140s.  Patient denies any symptoms.  Patient states that he is unaware that his heart rate is elevated.  EKG shows atrial flutter with RVR.  Patient is also hypotensive.  He has not followed up with his regular doctor in several years.  Patient was seen by cardiologist and was started on digoxin because after getting Cardizem his blood pressure dropped. Cardiology recommended IV heparin and plans for TEE and DCCV.    Assessment & Plan:   Active Problems:   Hx of bacterial endocarditis   H/O mitral valve replacement   Basal cell carcinoma (BCC) of skin of left wrist   Atrial flutter (HCC)   Microcytic anemia   COPD (chronic obstructive pulmonary disease) (HCC)   Atrial fibrillation with RVR (HCC)  #1 A. fib/a flutter with RVR patient remains in A. fib a flutter with RVR.  He was loaded with digoxin.  Patient was given diltiazem and patient became hypotensive. Heparin drip stopped today patient was found to be FOBT positive.  GI consulted.  #2 history of mitral valve replacement and infective endocarditis.  His ejection fraction is 65 to 70%.  Normal left ventricular function and no regional wall motion abnormalities.  #3 chronic microcytic anemia patient is FOBT positive.  He denies melena or hematochezia.  His anemia panel is consistent with severe iron deficiency anemia.  I will give him a dose of IV Feraheme.  #4 history of tobacco abuse    Estimated body mass index is 24.28 kg/m as calculated from the following:   Height as of this encounter: 6' (1.829 m).   Weight as of this encounter: 81.2 kg.  DVT  prophylaxis: None due to GI bleed  code Status: Full code Family Communication: None at bedside  disposition Plan:  Status is: Inpatient  Dispo: The patient is from: Home              Anticipated d/c is to: Home              Patient currently is not medically stable to d/c.   Difficult to place patient No       Consultants:   Cardiology and GI  Procedures: None Antimicrobials: None Subjective: He is resting in bed very upset for being in the hospital and wants to go home denies any nausea vomiting chest pain palpitation shortness of breath.  He says he feels fine and he wants to go home  Objective: Vitals:   10/19/20 0800 10/19/20 0900 10/19/20 1000 10/19/20 1055  BP: 93/61 (!) 87/61 95/66 90/65   Pulse: (!) 131 (!) 131 (!) 130 (!) 132  Resp: 17 18 16  (!) 21  Temp:    97.8 F (36.6 C)  TempSrc:    Oral  SpO2: 92% 92% 97% 94%  Weight:      Height:        Intake/Output Summary (Last 24 hours) at 10/19/2020 1424 Last data filed at 10/19/2020 0931 Gross per 24 hour  Intake 435.73 ml  Output 725 ml  Net -289.27 ml   Filed Weights   10/18/20 0451  Weight: 81.2 kg    Examination:  General exam:  Appears calm and comfortable  Respiratory system: Clear to auscultation. Respiratory effort normal. Cardiovascular system: S1 & S2 heard, irregular. No JVD, murmurs, rubs, gallops or clicks. No pedal edema. Gastrointestinal system: Abdomen is nondistended, soft and nontender. No organomegaly or masses felt. Normal bowel sounds heard. Central nervous system: Alert and oriented. No focal neurological deficits. Extremities: Symmetric 5 x 5 power. Skin: No rashes, lesions or ulcers Psychiatry: Judgement and insight appear normal. Mood & affect appropriate.     Data Reviewed: I have personally reviewed following labs and imaging studies  CBC: Recent Labs  Lab 10/17/20 1446 10/18/20 0336 10/18/20 0904 10/19/20 0030  WBC 9.9 8.4  --  6.6  HGB 9.1* 7.8* 8.4* 8.0*  HCT  30.6* 26.2* 27.5* 25.9*  MCV 74.1* 74.6*  --  72.1*  PLT ACLMP 79*  --  68*   Basic Metabolic Panel: Recent Labs  Lab 10/17/20 1446 10/18/20 0336  NA 139 134*  K 3.8 4.2  CL 105 104  CO2 26 23  GLUCOSE 91 88  BUN 31* 30*  CREATININE 1.13 1.08  CALCIUM 8.7* 8.0*  MG 1.9  --    GFR: Estimated Creatinine Clearance: 63.9 mL/min (by C-G formula based on SCr of 1.08 mg/dL). Liver Function Tests: No results for input(s): AST, ALT, ALKPHOS, BILITOT, PROT, ALBUMIN in the last 168 hours. No results for input(s): LIPASE, AMYLASE in the last 168 hours. No results for input(s): AMMONIA in the last 168 hours. Coagulation Profile: No results for input(s): INR, PROTIME in the last 168 hours. Cardiac Enzymes: No results for input(s): CKTOTAL, CKMB, CKMBINDEX, TROPONINI in the last 168 hours. BNP (last 3 results) No results for input(s): PROBNP in the last 8760 hours. HbA1C: Recent Labs    10/18/20 0336  HGBA1C 5.7*   CBG: No results for input(s): GLUCAP in the last 168 hours. Lipid Profile: No results for input(s): CHOL, HDL, LDLCALC, TRIG, CHOLHDL, LDLDIRECT in the last 72 hours. Thyroid Function Tests: No results for input(s): TSH, T4TOTAL, FREET4, T3FREE, THYROIDAB in the last 72 hours. Anemia Panel: Recent Labs    10/18/20 0336  FERRITIN 35  TIBC 283  IRON 16*   Sepsis Labs: No results for input(s): PROCALCITON, LATICACIDVEN in the last 168 hours.  Recent Results (from the past 240 hour(s))  SARS CORONAVIRUS 2 (TAT 6-24 HRS) Nasopharyngeal Nasopharyngeal Swab     Status: None   Collection Time: 10/17/20  8:02 PM   Specimen: Nasopharyngeal Swab  Result Value Ref Range Status   SARS Coronavirus 2 NEGATIVE NEGATIVE Final    Comment: (NOTE) SARS-CoV-2 target nucleic acids are NOT DETECTED.  The SARS-CoV-2 RNA is generally detectable in upper and lower respiratory specimens during the acute phase of infection. Negative results do not preclude SARS-CoV-2 infection, do  not rule out co-infections with other pathogens, and should not be used as the sole basis for treatment or other patient management decisions. Negative results must be combined with clinical observations, patient history, and epidemiological information. The expected result is Negative.  Fact Sheet for Patients: SugarRoll.be  Fact Sheet for Healthcare Providers: https://www.woods-.com/  This test is not yet approved or cleared by the [place] FDA and  has been authorized for detection and/or diagnosis of SARS-CoV-2 by FDA under an Emergency Use Authorization (EUA). This EUA will remain  in effect (meaning this test can be used) for the duration of the COVID-19 declaration under Se ction 564(b)(1) of the Act, 21 U.S.C. section 360bbb-3(b)(1), unless the authorization is terminated or  revoked sooner.  Performed at [facility], [place] [address], [place], [place] [address]   MRSA PCR Screening     Status: None   Collection Time: 10/18/20  2:15 PM   Specimen: Nasopharyngeal  Result Value Ref Range Status   MRSA by PCR NEGATIVE NEGATIVE Final    Comment:        The GeneXpert MRSA Assay (FDA approved for NASAL specimens only), is one component of a comprehensive MRSA colonization surveillance program. It is not intended to diagnose MRSA infection nor to guide or monitor treatment for MRSA infections. Performed at [facility], [place] [address] [place], [place] [address]          Radiology Studies: DG Chest 2 View  Result Date: 10/17/2020 CLINICAL DATA:  Chest pain, shortness of breath, extreme fatigue EXAM: CHEST - 2 VIEW COMPARISON:  None. FINDINGS: Cardiomediastinal silhouette and pulmonary vasculature are within normal limits. Median sternotomy changes are seen. The third wire is fractured. Prosthetic valve is seen. Lungs are hyperexpanded, but otherwise clear clear. IMPRESSION: No acute cardiopulmonary  process. Electronically Signed   By: Miachel Roux M.D.   On: 10/17/2020 15:46   ECHOCARDIOGRAM COMPLETE  Result Date: 10/19/2020    ECHOCARDIOGRAM REPORT   Patient Name:   [name] Date of Exam: 10/19/2020 Medical Rec #:  [id number]        Height:       72.0 in Accession #:    [id number]       Weight:       179.0 lb Date of Birth:  [birth year]        BSA:          2.032 m Patient Age:    62 years         BP:           93/61 mmHg Patient Gender: M                HR:           133 bpm. Exam Location:  Inpatient Procedure: 2D Echo, 3D Echo, Cardiac Doppler and Color Doppler Indications:    Abnormal EKG  History:        Patient has prior history of Echocardiogram examinations, most                 recent 07/24/2016. COPD, Signs/Symptoms:Shortness of Breath; Risk                 Factors:Current Smoker and Hypertension. History of                 endocarditis.                  Mitral Valve: 31 mm Medtronic Mosaic bioprosthetic valve valve                 is present in the mitral position. Procedure Date: 07/14/2016.  Sonographer:    Darlina Sicilian RDCS Referring Phys: [phone] BY[place]  Sonographer Comments: Global longitudinal strain was attempted. IMPRESSIONS  1. Left ventricular ejection fraction, by estimation, is 65 to 70%. The left ventricle has normal function. The left ventricle has no regional wall motion abnormalities. Left ventricular diastolic function could not be evaluated.  2. Right ventricular systolic function is normal. The right ventricular size is mildly enlarged. There is normal pulmonary artery systolic pressure. The estimated right ventricular systolic pressure is 95.0 mmHg.  3. 31 mm mosaic prosthetic valve with MG  7 mmHG @ 130 bpm. EOA 2.32 cm2. DI 1.6. Normal prosthesis with higher gradients due to tachycardia. The mitral valve has been repaired/replaced. No evidence of mitral valve regurgitation. There is a 31 mm Medtronic Mosaic bioprosthetic valve present in the mitral position.  Procedure Date: 07/14/2016.  4. The aortic valve is tricuspid. Aortic valve regurgitation is not visualized. No aortic stenosis is present.  5. The inferior vena cava is normal in size with greater than 50% respiratory variability, suggesting right atrial pressure of 3 mmHg. FINDINGS  Left Ventricle: Left ventricular ejection fraction, by estimation, is 65 to 70%. The left ventricle has normal function. The left ventricle has no regional wall motion abnormalities. The left ventricular internal cavity size was normal in size. There is  no left ventricular hypertrophy. Left ventricular diastolic function could not be evaluated due to mitral valve replacement. Left ventricular diastolic function could not be evaluated. Right Ventricle: The right ventricular size is mildly enlarged. No increase in right ventricular wall thickness. Right ventricular systolic function is normal. There is normal pulmonary artery systolic pressure. The tricuspid regurgitant velocity is 2.46  m/s, and with an assumed right atrial pressure of 3 mmHg, the estimated right ventricular systolic pressure is [id number] mmHg. Left Atrium: Left atrial size was normal in size. Right Atrium: Right atrial size was normal in size. Pericardium: Trivial pericardial effusion is present. Mitral Valve: 31 mm mosaic prosthetic valve with MG 7 mmHG @ 130 bpm. EOA 2.32 cm2. DI 1.6. Normal prosthesis with higher gradients due to tachycardia. The mitral valve has been repaired/replaced. No evidence of mitral valve regurgitation. There is a 31 mm Medtronic Mosaic bioprosthetic valve present in the mitral position. Procedure Date: 07/14/2016. MV peak gradient, 12.2 mmHg. The mean mitral valve gradient is 7.0 mmHg. Tricuspid Valve: The tricuspid valve is grossly normal. Tricuspid valve regurgitation is mild . No evidence of tricuspid stenosis. Aortic Valve: The aortic valve is tricuspid. Aortic valve regurgitation is not visualized. No aortic stenosis is present. Pulmonic  Valve: The pulmonic valve was grossly normal. Pulmonic valve regurgitation is not visualized. No evidence of pulmonic stenosis. Aorta: The aortic root and ascending aorta are structurally normal, with no evidence of dilitation. Venous: The inferior vena cava is normal in size with greater than 50% respiratory variability, suggesting right atrial pressure of 3 mmHg. IAS/Shunts: The atrial septum is grossly normal.  LEFT VENTRICLE PLAX 2D LVIDd:         3.50 cm LVIDs:         2.40 cm LV PW:         0.90 cm LV IVS:        0.90 cm LVOT diam:     2.20 cm LV SV:         49 LV SV Index:   24 LVOT Area:     3.80 cm  RIGHT VENTRICLE RV S prime:     9.03 cm/s TAPSE (M-mode): 1.3 cm LEFT ATRIUM             Index       RIGHT ATRIUM           Index LA diam:        2.80 cm 1.38 cm/m  RA Area:     16.00 cm LA Vol (A2C):   16.8 ml 8.27 ml/m  RA Volume:   44.60 ml  21.94 ml/m LA Vol (A4C):   32.1 ml 15.79 ml/m LA Biplane Vol: 25.2 ml 12.40 ml/m  AORTIC VALVE LVOT  Vmax:   98.40 cm/s LVOT Vmean:  67.900 cm/s LVOT VTI:    0.128 m  AORTA Ao Root diam: 3.70 cm Ao Asc diam:  3.30 cm MITRAL VALVE             TRICUSPID VALVE MV Area VTI:  2.32 cm   TR Peak grad:   24.2 mmHg MV Peak grad: 12.2 mmHg  TR Vmax:        246.00 cm/s MV Mean grad: 7.0 mmHg MV Vmax:      1.75 m/s   SHUNTS MV Vmean:     122.0 cm/s Systemic VTI:  0.13 m MV VTI:       0.21 m     Systemic Diam: 2.20 cm Eleonore Chiquito MD Electronically signed by Eleonore Chiquito MD Signature Date/Time: 10/19/2020/1:35:36 PM    Final         Scheduled Meds: . apixaban  5 mg Oral BID  . digoxin  0.125 mg Oral Daily  . diltiazem  60 mg Oral Q12H  . umeclidinium bromide  1 puff Inhalation Daily   Continuous Infusions:   LOS: 2 days   Georgette Shell, MD  10/19/2020, 2:24 PM

## 2020-10-19 NOTE — Progress Notes (Addendum)
Progress Note  Patient Name: [name] Date of Encounter: 10/19/2020  [facility] Cardiologist: New, Dr Gardiner Rhyme  Subjective   No complaints  Inpatient Medications    Scheduled Meds: . digoxin  0.125 mg Oral Daily  . diltiazem  60 mg Oral Q12H  . umeclidinium bromide  1 puff Inhalation Daily   Continuous Infusions: . heparin 1,550 Units/hr (10/19/20 0209)   PRN Meds: [email or web address]   Vital Signs    Vitals:   10/19/20 0800 10/19/20 0900 10/19/20 1000 10/19/20 1055  BP: 93/61 (!) 87/61 95/66 90/65   Pulse: (!) 131 (!) 131 (!) 130 (!) 132  Resp: 17 18 16  (!) 21  Temp:    97.8 F (36.6 C)  TempSrc:    Oral  SpO2: 92% 92% 97% 94%  Weight:      Height:        Intake/Output Summary (Last 24 hours) at 10/19/2020 1252 Last data filed at 10/19/2020 0931 Gross per 24 hour  Intake 435.73 ml  Output 725 ml  Net -289.27 ml   Last 3 Weights 10/18/2020 09/03/2020 01/29/2020  Weight (lbs) 179 lb 165 lb 6 oz 176 lb  Weight (kg) 81.194 kg 75.014 kg 79.833 kg      Telemetry    Aflutter rates 130s - Personally Reviewed  ECG    n/a - Personally Reviewed  Physical Exam   GEN: No acute distress.   Neck: No JVD Cardiac: regular tachy, no murmurs, rubs, or gallops.  Respiratory: Clear to auscultation bilaterally. GI: Soft, nontender, non-distended  MS: No edema; No deformity. Neuro:  Nonfocal  Psych: Normal affect   Labs    High Sensitivity Troponin:   Recent Labs  Lab 10/17/20 1446 10/17/20 1646  TROPONINIHS 5 6      Chemistry Recent Labs  Lab 10/17/20 1446 10/18/20 0336  NA 139 134*  K 3.8 4.2  CL 105 104  CO2 26 23  GLUCOSE 91 88  BUN 31* 30*  CREATININE 1.13 1.08  CALCIUM 8.7* 8.0*  GFRNONAA >60 >60  ANIONGAP 8 7     Hematology Recent Labs  Lab 10/17/20 1446 10/18/20 0336 10/18/20 0904 10/19/20 0030  WBC 9.9 8.4  --  6.6  RBC 4.13* 3.51*  --  3.59*  HGB 9.1* 7.8* 8.4* 8.0*  HCT 30.6* 26.2* 27.5* 25.9*  MCV 74.1* 74.6*  --   72.1*  MCH 22.0* 22.2*  --  22.3*  MCHC 29.7* 29.8*  --  30.9  RDW 17.5* 17.8*  --  17.2*  PLT ACLMP 79*  --  68*    BNPNo results for input(s): BNP, PROBNP in the last 168 hours.   DDimer No results for input(s): DDIMER in the last 168 hours.   Radiology    DG Chest 2 View  Result Date: 10/17/2020 CLINICAL DATA:  Chest pain, shortness of breath, extreme fatigue EXAM: CHEST - 2 VIEW COMPARISON:  None. FINDINGS: Cardiomediastinal silhouette and pulmonary vasculature are within normal limits. Median sternotomy changes are seen. The third wire is fractured. Prosthetic valve is seen. Lungs are hyperexpanded, but otherwise clear clear. IMPRESSION: No acute cardiopulmonary process. Electronically Signed   By: Miachel Roux M.D.   On: 10/17/2020 15:46    Cardiac Studies    Patient Profile     75 y.o. male with history of bacterial endocarditis status post bioprosthetic mitral valve replacement 07/2016, diabetes, hypertension, dementia, ongoing tobacco abuse, COPD, atrial fibrillation, and untreated basal cell carcinoma admitted with new onset atrial flutter with  RVR.  Assessment & Plan    1. Aflutter - soft bp's, did not tolerate diltiazem - has been loaded with IV digoxin. REecived IV 0.25mg  followed by 0.125mg  x 2 additional doses, on 0.125mg  maintence. Dilt lowered to 60mg  bid.  - on IV heparin, initial drop in Hgb but has stablisized. Hold on oral anticoag for now until trend further established. Nursing reports may have been some blood with stool this AM, FOBT sent off and to notify primary team - possibly cardioversion is Hgb is stable and confident can commit to uninterrupted anticoag  - from notes amio not started due concerns about chemical conversion in patient no previously anticoagulated  - continue rate control best we can with dilt as tolerated, digoxin. - echo pending today  2. Status post MVR: He has a history of bacterial endocarditis and had a bioprosthetic valve placed  in 2018  For questions or updates, please contact [place] Please consult [email or web address] for contact info under        Signed, Carlyle Dolly, MD  10/19/2020, 12:52 PM

## 2020-10-19 NOTE — Consult Note (Signed)
ELECTROPHYSIOLOGY CONSULT NOTE    Primary Care Physician: Pcp, No Referring Physician:  Dr Oval Linsey  Admit Date: 10/17/2020  Reason for consultation:  Atrial flutter  [name] is a 75 y.o. male with a h/o prior endocarditis requiring mitral valve replacement at [facility], HTN, DM and dementia who is admitted with atrial flutter. He presented for an outpatient echo and was noted to have atypical atrial flutter with RVR.  He was admitted for further evaluation.  He has been noted to have both thrombocytopenia and anemia.  He was started on IV heparin and has noticed worsening of both.  He has also been treated with IV diltiazem for rate control.  This has been complicated by low BP.  He has been treated subsequently with digoxin.  He continues to have episodes of RVR.  There has been reluctance to proceed with [facility] due to anemia and concerns of [place] therapy.  Today, he denies symptoms of palpitations, chest pain, shortness of breath, syncope, or neurologic sequela. He only wants to go home and smoke.  Past Medical History:  Diagnosis Date  . Anemia   . Atrial fibrillation ([place])    post operative in 2018  . Atrial flutter ([place])   . Bacteremia 2018  . Basal cell carcinoma   . COPD (chronic obstructive pulmonary disease) ([place])   . Dementia ([place])   . Diabetes ([place])   . DVT of deep femoral vein ([place])   . Hypertension   . Thrombocytopenia ([place])    Past Surgical History:  Procedure Laterality Date  . MITRAL VALVE REPLACEMENT  07/2016   [facility] for endocarditis    . digoxin  0.125 mg Oral Daily  . diltiazem  60 mg Oral Q12H  . umeclidinium bromide  1 puff Inhalation Daily   . heparin 1,550 Units/hr (10/19/20 0209)    Allergies  Allergen Reactions  . Orange Juice [Orange Oil] Hives    Social History   Socioeconomic History  . Marital status: Single    Spouse name: Not on file  . Number of children: Not on file  . Years of education: Not on file  . Highest  education level: Not on file  Occupational History  . Not on file  Tobacco Use  . Smoking status: Current Every Day Smoker    Years: 62.00    Types: Cigarettes, Pipe  . Smokeless tobacco: Never Used  Vaping Use  . Vaping Use: Never used  Substance and Sexual Activity  . Alcohol use: Not Currently    Comment: occasionally drinks beer 09/03/20  . Drug use: Not Currently    Comment: previous marijuana use  . Sexual activity: Not Currently  Other Topics Concern  . Not on file  Social History Narrative   moved from [place] to [place] in 2019 to live with his son    Social Determinants of Radio broadcast assistant Strain: Not on [employer] Insecurity: Not on file  Transportation Needs: Unmet Transportation Needs  . Lack of Transportation (Medical): Yes  . Lack of Transportation (Non-Medical): Yes  Physical Activity: Not on file  Stress: Not on file  Social Connections: Not on file  Intimate Partner Violence: Not on file    Family History  Problem Relation Age of Onset  . Hypertension Other     ROS- All systems are reviewed and negative except as per the HPI above  Physical Exam: Telemetry:  Atrial flutter with RVR Vitals:   10/19/20 0800 10/19/20 0900  10/19/20 1000 10/19/20 1055  BP: 93/61 (!) 87/61 95/66 90/65   Pulse: (!) 131 (!) 131 (!) 130 (!) 132  Resp: 17 18 16  (!) 21  Temp:    97.8 F (36.6 C)  TempSrc:    Oral  SpO2: 92% 92% 97% 94%  Weight:      Height:        GEN- The patient is chronically ill appearing, alert and oriented x 3 today.   Head- normocephalic, atraumatic Eyes-  Sclera clear, conjunctiva pink Ears- hearing intact Oropharynx- clear Neck- supple,   Lungs-  normal work of breathing Heart- tachycardic irregular rhythm GI- soft  Extremities- no clubbing, cyanosis, or edema MS-  Diffuse atrophy Skin- wrist BCC noted Psych- euthymic mood, full affect Neuro- strength and sensation are intact  EKG-  Atrial flutter with  RVR  Labs:   Lab Results  Component Value Date   WBC 6.6 10/19/2020   HGB 8.0 (L) 10/19/2020   HCT 25.9 (L) 10/19/2020   MCV 72.1 (L) 10/19/2020   PLT 68 (L) 10/19/2020    Recent Labs  Lab 10/18/20 0336  NA 134*  K 4.2  CL 104  CO2 23  BUN 30*  CREATININE 1.08  CALCIUM 8.0*  GLUCOSE 88    Echo: pending  ASSESSMENT AND PLAN:   1. Atypical atrial flutter Atypical appearing but possibly isthmus dependant.  I worry about leaving him in atrial flutter given his frequent hypotension and difficult to control V rates. If he decompensates further, we should proceed with emergent [facility] according to ACLS protocol. Otherwise, we will try to do this more electively. Stop IV heparin and start on eliquis. Will plan TEE guided [facility] on Monday. Continue current rate control strategy  2. Anemia/ thrombocytopenia Stop heparin Will need to follow closely on eliquis No obvious bleeding May need PRBCs to support BP  3. S/p MVR (bioprosthetic) Echo pending  4. Tobacco Cessation advised  Very complicated patient.  At risk for decompensation/ CHF.  A high level of decision making was required for this encounter.  I have spoken at length with Dr Zigmund Daniel.   We agree that options are very limited and patient risks are very high.  Thompson Grayer, MD 10/19/2020  12:52 PM

## 2020-10-19 NOTE — Plan of Care (Signed)
  Problem: Education: Goal: Knowledge of General Education information will improve Description: Including pain rating scale, medication(s)/side effects and non-pharmacologic comfort measures Outcome: Progressing   Problem: Nutrition: Goal: Adequate nutrition will be maintained Outcome: Progressing   Problem: Coping: Goal: Level of anxiety will decrease Outcome: Progressing   Problem: Elimination: Goal: Will not experience complications related to bowel motility Outcome: Not Applicable   Problem: Skin Integrity: Goal: Risk for impaired skin integrity will decrease Outcome: Not Applicable

## 2020-10-19 NOTE — Progress Notes (Signed)
ANTICOAGULATION CONSULT NOTE -  Follow up Pharmacy Consult for heparin Indication: atrial fibrillation  Allergies  Allergen Reactions  . Orange Juice [Orange Oil] Hives      Patient Measurements: Height: 6' (182.9 cm) Weight: 81.2 kg (179 lb) IBW/kg (Calculated) : 77.6 Heparin Dosing Weight:  81.2 kg  Vital Signs: Temp: 97.8 F (36.6 C) (05/21 1055) Temp Source: Oral (05/21 1055) BP: 90/65 (05/21 1055) Pulse Rate: 132 (05/21 1055)  Labs: Recent Labs    10/17/20 1446 10/17/20 1646 10/18/20 0336 10/18/20 0904 10/18/20 1300 10/18/20 2331 10/19/20 0030 10/19/20 1001  HGB 9.1*  --  7.8* 8.4*  --   --  8.0*  --   HCT 30.6*  --  26.2* 27.5*  --   --  25.9*  --   PLT ACLMP  --  79*  --   --   --  68*  --   HEPARINUNFRC  --   --  <0.10*  --    < > <0.10* <0.10* 0.11*  CREATININE 1.13  --  1.08  --   --   --   --   --   TROPONINIHS 5 6  --   --   --   --   --   --    < > = values in this interval not displayed.    Estimated Creatinine Clearance: 63.9 mL/min (by C-G formula based on SCr of 1.08 mg/dL).   Medical History: Past Medical History:  Diagnosis Date  . Anemia   . Atrial fibrillation ([place])    post operative in 2018  . Atrial flutter ([place])   . Bacteremia 2018  . Basal cell carcinoma   . COPD (chronic obstructive pulmonary disease) ([place])   . Dementia ([place])   . Diabetes ([place])   . 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 Hypertension   . Thrombocytopenia (HCC)     Medications:  NA  Assessment: 37 yom with hx of bacterial endocarditis s/p mitral valve replacement in 2018, DVT, DM, and atrial fibrillation. Post mitral valve replacement with medtronic 31 mm prosthetic heart valve in 2018, patient did not follow regularly with medical treatment. No anticoagulation PTA. Presented to ED today for ECHO, subsequently sent to ED for evaluation of tachycardia. Patient found to be in atrial flutter with rate of 142 on admission. Current Hgb 9.1 (BL~11), Scr 1.13, and Plt  unable to evaluate. Cardiology wishes to pursue heparin anticoagulation to see if patient can tolerate prior to starting oral AC. Plan for TEE/DCCV during admission if tolerating AC. Of note, patient has open wounds 2/2 to basal cell carcinoma.   Heparin level increasing this morning to 0.11.  AT3 slightly low (67) which is not unexpected on IV heparin.  No overt bleeding or complications noted.  Some BRBPR per RN, but not large amount.   Goal of Therapy:  Heparin level 0.3-0.7 units/ml Monitor platelets by anticoagulation protocol: Yes   Plan:  Increase IV heparin to 1700 units/hr. Repeat heparin level in 8 hrs. Daily heparin level and CBC.  [place] Crane, Roylene Reason, BCCP Clinical Pharmacist  10/19/2020 12:55 PM   [facility] pharmacy phone numbers are listed on [email or web address]

## 2020-10-19 NOTE — Progress Notes (Signed)
   10/18/20 2349  Assess: MEWS Score  Temp 98.8 F (37.1 C)  BP 94/70  Pulse Rate (!) 133  ECG Heart Rate (!) 133  Resp 15  SpO2 95 %  Assess: MEWS Score  MEWS Temp 0  MEWS Systolic 1  MEWS Pulse 3  MEWS RR 0  MEWS LOC 0  MEWS Score 4  MEWS Score Color Red  Assess: if the MEWS score is Yellow or Red  Were vital signs taken at a resting state? Yes  Focused Assessment No change from prior assessment  MEWS guidelines implemented *See Row Information* No, previously red, continue vital signs every 4 hours (Heart rate elevated, new medication routine)  Treat  Pain Scale 0-10  Pain Score 0  Take Vital Signs  Increase Vital Sign Frequency  Red: Q 1hr X 4 then Q 4hr X 4, if remains red, continue Q 4hrs  Notify: Charge Nurse/RN  Name of Charge Nurse/RN Notified Shiwangi, RN  Date Charge Nurse/RN Notified 10/19/20  Time Charge Nurse/RN Notified [phone]  Document  Patient Outcome  (Continue to monitor, Cards to see in the am.)

## 2020-10-19 NOTE — Progress Notes (Addendum)
ANTICOAGULATION CONSULT NOTE -  Follow up Pharmacy Consult for heparin Indication: atrial fibrillation  Allergies  Allergen Reactions  . Orange Juice [Orange Oil] Hives      Patient Measurements: Height: 6' (182.9 cm) Weight: 81.2 kg (179 lb) IBW/kg (Calculated) : 77.6 Heparin Dosing Weight:  81.2 kg  Vital Signs: Temp: 98.1 F (36.7 C) (05/21 2018) Temp Source: Oral (05/21 2018) BP: 93/55 (05/21 2128) Pulse Rate: 66 (05/21 2018)  Labs: Recent Labs    10/17/20 1446 10/17/20 1646 10/18/20 0336 10/18/20 0904 10/18/20 1300 10/18/20 2331 10/19/20 0030 10/19/20 1001  HGB 9.1*  --  7.8* 8.4*  --   --  8.0*  --   HCT 30.6*  --  26.2* 27.5*  --   --  25.9*  --   PLT ACLMP  --  79*  --   --   --  68*  --   HEPARINUNFRC  --   --  <0.10*  --    < > <0.10* <0.10* 0.11*  CREATININE 1.13  --  1.08  --   --   --   --   --   TROPONINIHS 5 6  --   --   --   --   --   --    < > = values in this interval not displayed.    Estimated Creatinine Clearance: 63.9 mL/min (by C-G formula based on SCr of 1.08 mg/dL).   Medical History: Past Medical History:  Diagnosis Date  . Anemia   . Atrial fibrillation ([place])    post operative in 2018  . Atrial flutter ([place])   . Bacteremia 2018  . Basal cell carcinoma   . COPD (chronic obstructive pulmonary disease) ([place])   . Dementia ([place])   . Diabetes ([place])   . 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 Hypertension   . Thrombocytopenia (HCC)     Medications:  NA  Assessment: 56 yom with hx of bacterial endocarditis s/p mitral valve replacement in 2018, DVT, DM, and atrial fibrillation. Post mitral valve replacement with medtronic 31 mm prosthetic heart valve in 2018, patient did not follow regularly with medical treatment. No anticoagulation PTA. Presented to ED today for ECHO, subsequently sent to ED for evaluation of tachycardia. Patient found to be in atrial flutterPlan for TEE/DCCV during admission if tolerating AC. Of note, patient has  open wounds 2/2 to basal cell carcinoma.   FOBT positive and heparin stopped earlier today. GI saw and endoscopic evaluation planned but can resume heparin. Apixaban ordered earlier today but it was not given. May resume heparin per Dr. Tonie Griffith.    Goal of Therapy:  Heparin level= 0.3-0.5 Monitor platelets by anticoagulation protocol: Yes   Plan:  -Resume heparin at 1700 units/hr -Heparin level in 8 hours and daily wth CBC daily  Hildred Laser, PharmD Clinical Pharmacist **Pharmacist phone directory can now be found on [email or web address] ([email or web address]).  Listed under [place].

## 2020-10-20 DIAGNOSIS — Z952 Presence of prosthetic heart valve: Secondary | ICD-10-CM | POA: Diagnosis not present

## 2020-10-20 DIAGNOSIS — I484 Atypical atrial flutter: Secondary | ICD-10-CM | POA: Diagnosis not present

## 2020-10-20 DIAGNOSIS — D500 Iron deficiency anemia secondary to blood loss (chronic): Secondary | ICD-10-CM | POA: Diagnosis not present

## 2020-10-20 DIAGNOSIS — I4892 Unspecified atrial flutter: Secondary | ICD-10-CM | POA: Diagnosis not present

## 2020-10-20 LAB — COMPREHENSIVE METABOLIC PANEL
ALT: 23 U/L (ref 0–44)
AST: 16 U/L (ref 15–41)
Albumin: 2.3 g/dL — ABNORMAL LOW (ref 3.5–5.0)
Alkaline Phosphatase: 58 U/L (ref 38–126)
Anion gap: 6 (ref 5–15)
BUN: 26 mg/dL — ABNORMAL HIGH (ref 8–23)
CO2: 24 mmol/L (ref 22–32)
Calcium: 8.7 mg/dL — ABNORMAL LOW (ref 8.9–10.3)
Chloride: 106 mmol/L (ref 98–111)
Creatinine, Ser: 1.01 mg/dL (ref 0.61–1.24)
GFR, Estimated: >60 mL/min (ref >60)
Glucose, Bld: 90 mg/dL (ref 70–99)
Potassium: 3.9 mmol/L (ref 3.5–5.1)
Sodium: 136 mmol/L (ref 135–145)
Total Bilirubin: 1 mg/dL (ref 0.3–1.2)
Total Protein: 6.4 g/dL — ABNORMAL LOW (ref 6.5–8.1)

## 2020-10-20 LAB — CBC
HCT: 27.3 % — ABNORMAL LOW (ref 39.0–52.0)
Hemoglobin: 8.2 g/dL — ABNORMAL LOW (ref 13.0–17.0)
MCH: 22.3 pg — ABNORMAL LOW (ref 26.0–34.0)
MCHC: 30 g/dL (ref 30.0–36.0)
MCV: 74.2 fL — ABNORMAL LOW (ref 80.0–100.0)
Platelets: 69 10*3/uL — ABNORMAL LOW (ref 150–400)
RBC: 3.68 MIL/uL — ABNORMAL LOW (ref 4.22–5.81)
RDW: 17.5 % — ABNORMAL HIGH (ref 11.5–15.5)
WBC: 4.6 10*3/uL (ref 4.0–10.5)
nRBC: 0 % (ref 0.0–0.2)

## 2020-10-20 LAB — HEPARIN LEVEL (UNFRACTIONATED)
Heparin Unfractionated: 0.25 IU/mL — ABNORMAL LOW (ref 0.30–0.70)
Heparin Unfractionated: 0.27 IU/mL — ABNORMAL LOW (ref 0.30–0.70)

## 2020-10-20 MED ORDER — PEG-KCL-NACL-NASULF-NA ASC-C 100 G PO SOLR: 1.0000 | Freq: Once | ORAL | Status: DC

## 2020-10-20 MED ORDER — NICOTINE 14 MG/24HR TD PT24
14.0000 mg | MEDICATED_PATCH | Freq: Every day | TRANSDERMAL | Status: DC
  Administered 2020-10-20 – 2020-11-01 (×12): 14 mg via TRANSDERMAL
  Filled 2020-10-20 (×12): qty 1

## 2020-10-20 MED ORDER — PEG-KCL-NACL-NASULF-NA ASC-C 100 G PO SOLR
0.5000 | Freq: Once | ORAL | Status: AC
  Administered 2020-10-20: 100 g via ORAL
  Filled 2020-10-20: qty 1

## 2020-10-20 MED ORDER — BISACODYL 5 MG PO TBEC
20.0000 mg | DELAYED_RELEASE_TABLET | Freq: Once | ORAL | Status: AC
  Administered 2020-10-20: 20 mg via ORAL
  Filled 2020-10-20: qty 4

## 2020-10-20 NOTE — H&P (View-Only) (Signed)
     [facility] Progress Note  CC:  I want to go home  Assessment / Plan: GI Blood loss, iron deficiency anemia with FOBT+ and bright red blood on the stool    - received IV Feraheme Atrial flutter with RVR, improved rate control, on heparin    - received one dose of Eliquis 10/19/20 Thrombocytopenia of unclear etiology    - no prior abdominal imaging No prior colon cancer screening Chronic GI symptoms: Rare diarrhea   Discussed with Dr. Fitzgerald. Given improved rate control, will recommend EGD and colonoscopy tomorrow. Will need to hold his heparin for at least 2 hours prior to the procedures. Given advanced age, comorbidities, and anticoagulants, the procedure is high risk. The nature of the procedure, as well as the risks, benefits, and alternatives were carefully and thoroughly reviewed with the patient. Ample time for discussion and questions allowed. The patient understood, was satisfied, and agreed to proceed.  Recommendations: - Clear liquid diet today - Bowel prep tonight - EGD and colonoscopy 10/21/20 - Continue empiric PPI - Serial hgb/hct with transfusion as indicated - Continue heparin, will need to hold at least 2 hours prior to endoscopy tomorrow  Subjective: Wants to go home. Definitely not thrilled to have a colonoscopy, but, agreeable. GI ROS is negative. There has been no overt GI bleeding.   Objective:  Vital signs in last 24 hours: Temp:  [98.1 F (36.7 C)-99.9 F (37.7 C)] 98.1 F (36.7 C) (05/22 0300) Pulse Rate:  [66-94] 85 (05/22 0300) Resp:  [15-20] 15 (05/22 0300) BP: (91-102)/(50-65) 102/63 (05/22 0300) SpO2:  [93 %-100 %] 97 % (05/22 0845) Last BM Date: 10/19/20 General:   Alert, in NAD Abdomen:  Soft. Nontender. Nondistended. Normal bowel sounds. No rebound or guarding. Neurologic:  Alert and  oriented x4;  grossly normal neurologically. Psych:  Alert and cooperative. Normal mood and affect.  Lab Results: Recent Labs     10/18/20 0336 10/18/20 0904 10/19/20 0030 10/20/20 0802  WBC 8.4  --  6.6 4.6  HGB 7.8* 8.4* 8.0* 8.2*  HCT 26.2* 27.5* 25.9* 27.3*  PLT 79*  --  68* 69*   BMET Recent Labs    10/17/20 1446 10/18/20 0336 10/20/20 0802  NA 139 134* 136  K 3.8 4.2 3.9  CL 105 104 106  CO2 26 23 24  GLUCOSE 91 88 90  BUN 31* 30* 26*  CREATININE 1.13 1.08 1.01  CALCIUM 8.7* 8.0* 8.7*   LFT Recent Labs    10/20/20 0802  PROT 6.4*  ALBUMIN 2.3*  AST 16  ALT 23  ALKPHOS 58  BILITOT 1.0       LOS: 3 days   [name]  10/20/2020, 11:53 AM   

## 2020-10-20 NOTE — Progress Notes (Signed)
Progress Note   Subjective   Doing well today, the patient denies CP or SOB.  No new concerns.  He wants to go home and pay bills and smoke.  V rates are better.  GI consult noted.  Inpatient Medications    Scheduled Meds: . digoxin  0.125 mg Oral Daily  . diltiazem  60 mg Oral Q12H  . pantoprazole  40 mg Oral BID  . umeclidinium bromide  1 puff Inhalation Daily   Continuous Infusions: . heparin 1,700 Units/hr (10/20/20 0459)   PRN Meds: levalbuterol   Vital Signs    Vitals:   10/19/20 2128 10/19/20 2300 10/20/20 0300 10/20/20 0845  BP: (!) 93/55 101/65 102/63   Pulse:  89 85   Resp:  20 15   Temp:  98.3 F (36.8 C) 98.1 F (36.7 C)   TempSrc:  Oral Oral   SpO2:  100% 95% 97%  Weight:      Height:        Intake/Output Summary (Last 24 hours) at 10/20/2020 8466 Last data filed at 10/20/2020 0300 Gross per 24 hour  Intake 313.55 ml  Output 850 ml  Net -536.45 ml   Filed Weights   10/18/20 0451  Weight: 81.2 kg    Telemetry    Atrial flutter, V rates improved - Personally Reviewed  Physical Exam   GEN- The patient is elderly and frail appearing, alert and oriented x 3 today.   Head- normocephalic, atraumatic Eyes-  Sclera clear, conjunctiva pink Ears- hearing intact Oropharynx- clear Neck- supple, Lungs-  normal work of breathing Heart- irregular rate and rhythm  GI- soft  Extremities- no clubbing, cyanosis, or edema  MS- diffuse atrophy Skin- no rash or lesion   Labs    Chemistry Recent Labs  Lab 10/17/20 1446 10/18/20 0336  NA 139 134*  K 3.8 4.2  CL 105 104  CO2 26 23  GLUCOSE 91 88  BUN 31* 30*  CREATININE 1.13 1.08  CALCIUM 8.7* 8.0*  GFRNONAA >60 >60  ANIONGAP 8 7     Hematology Recent Labs  Lab 10/17/20 1446 10/18/20 0336 10/18/20 0904 10/19/20 0030  WBC 9.9 8.4  --  6.6  RBC 4.13* 3.51*  --  3.59*  HGB 9.1* 7.8* 8.4* 8.0*  HCT 30.6* 26.2* 27.5* 25.9*  MCV 74.1* 74.6*  --  72.1*  MCH 22.0* 22.2*  --  22.3*   MCHC 29.7* 29.8*  --  30.9  RDW 17.5* 17.8*  --  17.2*  PLT ACLMP 79*  --  68*     Patient ID  75 y.o.malewith history of bacterial endocarditis status post bioprosthetic mitral valve replacement 07/2016, diabetes, hypertension, dementia, ongoing tobacco abuse, COPD, atrial fibrillation, and untreated basal cell carcinoma admitted with new onset atrial flutter with RVR.  Therapy has been complicated by anemia/ concerns for GI bleed  Assessment & Plan    1.  Atrial flutter (atypical) V rates are better Could potentially discharge on current rate control if unable to pursue sinus. Anticoagulation made difficult by thrombocytopenia and anemia We cannot pursue sinus rhythm until he can tolerate OAC therapy.  For now, our only option is current rate control. If he cannot take [place] on discharge, there may be very little benefit in IV heparin in the interim.  2. Anemia/ concerns for GI bleeding We cannot further optimize his rhythm until he is a candidate for oral anticoagulation.  It will be important to evaluate for possible GI source of bleeding. Proceed  with endotoscopic evaluation from a cardiology standpoint as soon as able. todays labs are pending  3. S/p MVR (bioprosthetic) Echo from yesterday reviewed and stable  4. Tobacco Cessation advised  Very difficult situation A high level of decision making was required for this encounter.  [place] Grayer MD, [facility] 10/20/2020 9:09 AM

## 2020-10-20 NOTE — Progress Notes (Signed)
ANTICOAGULATION CONSULT NOTE -  Follow up Pharmacy Consult for heparin Indication: atrial fibrillation  Allergies  Allergen Reactions  . Orange Juice [Orange Oil] Hives      Patient Measurements: Height: 6' (182.9 cm) Weight: 81.2 kg (179 lb) IBW/kg (Calculated) : 77.6 Heparin Dosing Weight:  81.2 kg  Vital Signs:    Labs: Recent Labs    10/18/20 0336 10/18/20 0904 10/18/20 1300 10/19/20 0030 10/19/20 1001 10/20/20 0802 10/20/20 1812  HGB 7.8* 8.4*  --  8.0*  --  8.2*  --   HCT 26.2* 27.5*  --  25.9*  --  27.3*  --   PLT 79*  --   --  68*  --  69*  --   HEPARINUNFRC <0.10*  --    < > <0.10* 0.11* 0.25* 0.27*  CREATININE 1.08  --   --   --   --  1.01  --    < > = values in this interval not displayed.    Estimated Creatinine Clearance: 68.3 mL/min (by C-G formula based on SCr of 1.01 mg/dL).   Medical History: Past Medical History:  Diagnosis Date  . Anemia   . Atrial fibrillation ([place])    post operative in 2018  . Atrial flutter ([place])   . Bacteremia 2018  . Basal cell carcinoma   . COPD (chronic obstructive pulmonary disease) ([place])   . Dementia ([place])   . Diabetes ([place])   . 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 Hypertension   . Thrombocytopenia (HCC)     Medications:  NA  Assessment: 89 yom with hx of bacterial endocarditis s/p mitral valve replacement in 2018, DVT, DM, and atrial fibrillation. Post mitral valve replacement with medtronic 31 mm prosthetic heart valve in 2018, patient did not follow regularly with medical treatment. No anticoagulation PTA. Presented to ED today for ECHO, subsequently sent to ED for evaluation of tachycardia. Patient found to be in atrial flutterPlan for TEE/DCCV during admission if tolerating AC. Of note, patient has open wounds 2/2 to basal cell carcinoma.   Heparin level remains slightly below goal after increase to 1850 units/hr  Goal of Therapy:  Heparin level= 0.3-0.5 Monitor platelets by anticoagulation protocol:  Yes   Plan:  Increase IV heparin to 1950 units/hr. Daily heparin level and CBC.  Hildred Laser, PharmD Clinical Pharmacist **Pharmacist phone directory can now be found on [email or web address] ([email or web address]).  Listed under [place].

## 2020-10-20 NOTE — Progress Notes (Signed)
ANTICOAGULATION CONSULT NOTE -  Follow up Pharmacy Consult for heparin Indication: atrial fibrillation  Allergies  Allergen Reactions  . Orange Juice [Orange Oil] Hives      Patient Measurements: Height: 6' (182.9 cm) Weight: 81.2 kg (179 lb) IBW/kg (Calculated) : 77.6 Heparin Dosing Weight:  81.2 kg  Vital Signs: Temp: 98.1 F (36.7 C) (05/22 0300) Temp Source: Oral (05/22 0300) BP: 102/63 (05/22 0300) Pulse Rate: 85 (05/22 0300)  Labs: Recent Labs    10/17/20 1446 10/17/20 1646 10/18/20 0336 10/18/20 0904 10/18/20 1300 10/19/20 0030 10/19/20 1001 10/20/20 0802  HGB 9.1*  --  7.8* 8.4*  --  8.0*  --  8.2*  HCT 30.6*  --  26.2* 27.5*  --  25.9*  --  27.3*  PLT ACLMP  --  79*  --   --  68*  --  69*  HEPARINUNFRC  --   --  <0.10*  --    < > <0.10* 0.11* 0.25*  CREATININE 1.13  --  1.08  --   --   --   --  1.01  TROPONINIHS 5 6  --   --   --   --   --   --    < > = values in this interval not displayed.    Estimated Creatinine Clearance: 68.3 mL/min (by C-G formula based on SCr of 1.01 mg/dL).   Medical History: Past Medical History:  Diagnosis Date  . Anemia   . Atrial fibrillation ([place])    post operative in 2018  . Atrial flutter ([place])   . Bacteremia 2018  . Basal cell carcinoma   . COPD (chronic obstructive pulmonary disease) ([place])   . Dementia ([place])   . Diabetes ([place])   . 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 Hypertension   . Thrombocytopenia (HCC)     Medications:  NA  Assessment: 57 yom with hx of bacterial endocarditis s/p mitral valve replacement in 2018, DVT, DM, and atrial fibrillation. Post mitral valve replacement with medtronic 31 mm prosthetic heart valve in 2018, patient did not follow regularly with medical treatment. No anticoagulation PTA. Presented to ED today for ECHO, subsequently sent to ED for evaluation of tachycardia. Patient found to be in atrial flutterPlan for TEE/DCCV during admission if tolerating AC. Of note, patient has  open wounds 2/2 to basal cell carcinoma.   Heparin level slightly below goal this AM.  No overt bleeding or complications noted currently.  Awaiting GI evaluation eventually.  CBC fairly stable, Platelet count low but seems stable.  Goal of Therapy:  Heparin level= 0.3-0.5 Monitor platelets by anticoagulation protocol: Yes   Plan:  Increase IV heparin to 1850 units/hr. Recheck heparin level 8 hrs. Daily heparin level and CBC.  [place] Crane, Roylene Reason, BCCP Clinical Pharmacist  10/20/2020 9:39 AM   [facility] pharmacy phone numbers are listed on [email or web address]

## 2020-10-20 NOTE — Progress Notes (Addendum)
     [facility] Progress Note  CC:  I want to go home  Assessment / Plan: GI Blood loss, iron deficiency anemia with FOBT+ and bright red blood on the stool    - received IV Feraheme Atrial flutter with RVR, improved rate control, on heparin    - received one dose of Eliquis 10/19/20 Thrombocytopenia of unclear etiology    - no prior abdominal imaging No prior colon cancer screening Chronic GI symptoms: Rare diarrhea   Discussed with Dr. Ola Spurr. Given improved rate control, will recommend EGD and colonoscopy tomorrow. Will need to hold his heparin for at least 2 hours prior to the procedures. Given advanced age, comorbidities, and anticoagulants, the procedure is high risk. The nature of the procedure, as well as the risks, benefits, and alternatives were carefully and thoroughly reviewed with the patient. Ample time for discussion and questions allowed. The patient understood, was satisfied, and agreed to proceed.  Recommendations: - Clear liquid diet today - Bowel prep tonight - EGD and colonoscopy 10/21/20 - Continue empiric PPI - Serial hgb/hct with transfusion as indicated - Continue heparin, will need to hold at least 2 hours prior to endoscopy tomorrow  Subjective: Wants to go home. Definitely not thrilled to have a colonoscopy, but, agreeable. GI ROS is negative. There has been no overt GI bleeding.   Objective:  Vital signs in last 24 hours: Temp:  [98.1 F (36.7 C)-99.9 F (37.7 C)] 98.1 F (36.7 C) (05/22 0300) Pulse Rate:  [66-94] 85 (05/22 0300) Resp:  [15-20] 15 (05/22 0300) BP: (91-102)/(50-65) 102/63 (05/22 0300) SpO2:  [93 %-100 %] 97 % (05/22 0845) Last BM Date: 10/19/20 General:   Alert, in NAD Abdomen:  Soft. Nontender. Nondistended. Normal bowel sounds. No rebound or guarding. Neurologic:  Alert and  oriented x4;  grossly normal neurologically. Psych:  Alert and cooperative. Normal mood and affect.  Lab Results: Recent Labs     10/18/20 0336 10/18/20 0904 10/19/20 0030 10/20/20 0802  WBC 8.4  --  6.6 4.6  HGB 7.8* 8.4* 8.0* 8.2*  HCT 26.2* 27.5* 25.9* 27.3*  PLT 79*  --  68* 69*   BMET Recent Labs    10/17/20 1446 10/18/20 0336 10/20/20 0802  NA 139 134* 136  K 3.8 4.2 3.9  CL 105 104 106  CO2 26 23 24   GLUCOSE 91 88 90  BUN 31* 30* 26*  CREATININE 1.13 1.08 1.01  CALCIUM 8.7* 8.0* 8.7*   LFT Recent Labs    10/20/20 0802  PROT 6.4*  ALBUMIN 2.3*  AST 16  ALT 23  ALKPHOS 58  BILITOT 1.0       LOS: 3 days   [place]  10/20/2020, 11:53 AM

## 2020-10-20 NOTE — Progress Notes (Signed)
PROGRESS NOTE    [name]  YSA:[id number] DOB: 1945/08/10 DOA: 10/17/2020 PCP: Pcp, No   Brief Narrative:75 years old male with PMH significant for bacterial endocarditis s/p mitral valve replacement in 2018, DVT, DM, HTN, dementia, tobacco use, COPD, atrial fibrillation, sarcoidosis, basal cell carcinoma who was transferred from echo lab to the ER with heart rate in 140s.  Patient denies any symptoms.  Patient states that he is unaware that his heart rate is elevated.  EKG shows atrial flutter with RVR.  Patient is also hypotensive.  He has not followed up with his regular doctor in several years.  Patient was seen by cardiologist and was started on digoxin because after getting Cardizem his blood pressure dropped. Cardiology recommended IV heparin and plans for TEE and DCCV.    Assessment & Plan:   Active Problems:   Hx of bacterial endocarditis   H/O mitral valve replacement   Basal cell carcinoma (BCC) of skin of left wrist   Atrial flutter (HCC)   Microcytic anemia   COPD (chronic obstructive pulmonary disease) (HCC)   Atrial fibrillation with RVR (HCC)   Occult blood in stools   Anemia due to GI blood loss  #1 A. fib/a flutter with RVR-rate controlled on digoxin and Cardizem 60 mg every 12.  Patient was started on heparin drip and Eliquis was stopped due to heme positive stools.   #2 history of mitral valve replacement and infective endocarditis.  His ejection fraction is 65 to 70%.  Normal left ventricular function and no regional wall motion abnormalities.  #3 chronic microcytic anemia patient is FOBT positive.  He denies melena or hematochezia.  His anemia panel is consistent with severe iron deficiency anemia.  He received a dose of Feraheme. GI consulted and appreciate their input.  Possible scope in a.m. Continue PPI.  #4 history of tobacco abuse-cessation advised.  He is not ready to give up smoking yet.  #5 basal cell carcinoma left hand covered with  dressing.    Estimated body mass index is 24.28 kg/m as calculated from the following:   Height as of this encounter: 6' (1.829 m).   Weight as of this encounter: 81.2 kg.  DVT prophylaxis: None due to GI bleed  code Status: Full code Family Communication: None at bedside  disposition Plan:  Status is: Inpatient  Dispo: The patient is from: Home              Anticipated d/c is to: Home              Patient currently is not medically stable to d/c.   Difficult to place patient No    Consultants:   Cardiology and GI  Procedures: None Antimicrobials: None Subjective: He is better feeling better and asking to go home. Rate controlled in the 80s.  Objective: Vitals:   10/19/20 2128 10/19/20 2300 10/20/20 0300 10/20/20 0845  BP: (!) 93/55 101/65 102/63   Pulse:  89 85   Resp:  20 15   Temp:  98.3 F (36.8 C) 98.1 F (36.7 C)   TempSrc:  Oral Oral   SpO2:  100% 95% 97%  Weight:      Height:        Intake/Output Summary (Last 24 hours) at 10/20/2020 1139 Last data filed at 10/20/2020 0300 Gross per 24 hour  Intake 313.55 ml  Output 600 ml  Net -286.45 ml   Filed Weights   10/18/20 0451  Weight: 81.2 kg  Examination:  General exam: Appears calm and comfortable  Respiratory system: Clear to auscultation. Respiratory effort normal. Cardiovascular system: S1 & S2 heard, irregular. No JVD, murmurs, rubs, gallops or clicks. No pedal edema. Gastrointestinal system: Abdomen is nondistended, soft and nontender. No organomegaly or masses felt. Normal bowel sounds heard. Central nervous system: Alert and oriented. No focal neurological deficits. Extremities: Symmetric 5 x 5 power. Skin: No rashes, lesions or ulcers Psychiatry: Judgement and insight appear normal. Mood & affect appropriate.     Data Reviewed: I have personally reviewed following labs and imaging studies  CBC: Recent Labs  Lab 10/17/20 1446 10/18/20 0336 10/18/20 0904 10/19/20 0030  10/20/20 0802  WBC 9.9 8.4  --  6.6 4.6  HGB 9.1* 7.8* 8.4* 8.0* 8.2*  HCT 30.6* 26.2* 27.5* 25.9* 27.3*  MCV 74.1* 74.6*  --  72.1* 74.2*  PLT ACLMP 79*  --  68* 69*   Basic Metabolic Panel: Recent Labs  Lab 10/17/20 1446 10/18/20 0336 10/20/20 0802  NA 139 134* 136  K 3.8 4.2 3.9  CL 105 104 106  CO2 _0 GLUCOSE 91 88 90  BUN 31* 30* 26*  CREATININE 1.13 1.08 1.01  CALCIUM 8.7* 8.0* 8.7*  MG 1.9  --   --    GFR: Estimated Creatinine Clearance: 68.3 mL/min (by C-G formula based on SCr of 1.01 mg/dL). Liver Function Tests: Recent Labs  Lab 10/20/20 0802  AST 16  ALT 23  ALKPHOS 58  BILITOT 1.0  PROT 6.4*  ALBUMIN 2.3*   No results for input(s): LIPASE, AMYLASE in the last 168 hours. No results for input(s): AMMONIA in the last 168 hours. Coagulation Profile: No results for input(s): INR, PROTIME in the last 168 hours. Cardiac Enzymes: No results for input(s): CKTOTAL, CKMB, CKMBINDEX, TROPONINI in the last 168 hours. BNP (last 3 results) No results for input(s): PROBNP in the last 8760 hours. HbA1C: Recent Labs    10/18/20 0336  HGBA1C 5.7*   CBG: No results for input(s): GLUCAP in the last 168 hours. Lipid Profile: No results for input(s): CHOL, HDL, LDLCALC, TRIG, CHOLHDL, LDLDIRECT in the last 72 hours. Thyroid Function Tests: No results for input(s): TSH, T4TOTAL, FREET4, T3FREE, THYROIDAB in the last 72 hours. Anemia Panel: Recent Labs    10/18/20 0336  FERRITIN 35  TIBC 283  IRON 16*   Sepsis Labs: No results for input(s): PROCALCITON, LATICACIDVEN in the last 168 hours.  Recent Results (from the past 240 hour(s))  SARS CORONAVIRUS 2 (TAT 6-24 HRS) Nasopharyngeal Nasopharyngeal Swab     Status: None   Collection Time: 10/17/20  8:02 PM   Specimen: Nasopharyngeal Swab  Result Value Ref Range Status   SARS Coronavirus 2 NEGATIVE NEGATIVE Final    Comment: (NOTE) SARS-CoV-2 target nucleic acids are NOT DETECTED.  The SARS-CoV-2 RNA  is generally detectable in upper and lower respiratory specimens during the acute phase of infection. Negative results do not preclude SARS-CoV-2 infection, do not rule out co-infections with other pathogens, and should not be used as the sole basis for treatment or other patient management decisions. Negative results must be combined with clinical observations, patient history, and epidemiological information. The expected result is Negative.  Fact Sheet for Patients: SugarRoll.be  Fact Sheet for Healthcare Providers: https://www.woods-.com/  This test is not yet approved or cleared by the [place] FDA and  has been authorized for detection and/or diagnosis of SARS-CoV-2 by FDA under an Emergency Use Authorization (EUA). This EUA  will remain  in effect (meaning this test can be used) for the duration of the COVID-19 declaration under Se ction 564(b)(1) of the Act, 21 U.S.C. section 360bbb-3(b)(1), unless the authorization is terminated or revoked sooner.  Performed at [facility], [place] [address], [place], [place] [address]   MRSA PCR Screening     Status: None   Collection Time: 10/18/20  2:15 PM   Specimen: Nasopharyngeal  Result Value Ref Range Status   MRSA by PCR NEGATIVE NEGATIVE Final    Comment:        The GeneXpert MRSA Assay (FDA approved for NASAL specimens only), is one component of a comprehensive MRSA colonization surveillance program. It is not intended to diagnose MRSA infection nor to guide or monitor treatment for MRSA infections. Performed at [facility], [place] [address] [place], [place] [address]          Radiology Studies: ECHOCARDIOGRAM COMPLETE  Result Date: 10/19/2020    ECHOCARDIOGRAM REPORT   Patient Name:   YAIDEN YANG Date of Exam: 10/19/2020 Medical Rec #:  [id number]        Height:       72.0 in Accession #:    [id number]       Weight:       179.0 lb Date of Birth:   [birth year]        BSA:          2.032 m Patient Age:    70 years         BP:           93/61 mmHg Patient Gender: M                HR:           133 bpm. Exam Location:  Inpatient Procedure: 2D Echo, 3D Echo, Cardiac Doppler and Color Doppler Indications:    Abnormal EKG  History:        Patient has prior history of Echocardiogram examinations, most                 recent 07/24/2016. COPD, Signs/Symptoms:Shortness of Breath; Risk                 Factors:Current Smoker and Hypertension. History of                 endocarditis.                  Mitral Valve: 31 mm Medtronic Mosaic bioprosthetic valve valve                 is present in the mitral position. Procedure Date: 07/14/2016.  Sonographer:    Darlina Sicilian RDCS Referring Phys: [phone] BYRON Mazie  Sonographer Comments: Global longitudinal strain was attempted. IMPRESSIONS  1. Left ventricular ejection fraction, by estimation, is 65 to 70%. The left ventricle has normal function. The left ventricle has no regional wall motion abnormalities. Left ventricular diastolic function could not be evaluated.  2. Right ventricular systolic function is normal. The right ventricular size is mildly enlarged. There is normal pulmonary artery systolic pressure. The estimated right ventricular systolic pressure is 76.2 mmHg.  3. 31 mm mosaic prosthetic valve with MG 7 mmHG @ 130 bpm. EOA 2.32 cm2. DI 1.6. Normal prosthesis with higher gradients due to tachycardia. The mitral valve has been repaired/replaced. No evidence of mitral valve regurgitation. There is a 31 mm Medtronic Mosaic bioprosthetic valve present in the mitral  position. Procedure Date: 07/14/2016.  4. The aortic valve is tricuspid. Aortic valve regurgitation is not visualized. No aortic stenosis is present.  5. The inferior vena cava is normal in size with greater than 50% respiratory variability, suggesting right atrial pressure of 3 mmHg. FINDINGS  Left Ventricle: Left ventricular ejection fraction, by  estimation, is 65 to 70%. The left ventricle has normal function. The left ventricle has no regional wall motion abnormalities. The left ventricular internal cavity size was normal in size. There is  no left ventricular hypertrophy. Left ventricular diastolic function could not be evaluated due to mitral valve replacement. Left ventricular diastolic function could not be evaluated. Right Ventricle: The right ventricular size is mildly enlarged. No increase in right ventricular wall thickness. Right ventricular systolic function is normal. There is normal pulmonary artery systolic pressure. The tricuspid regurgitant velocity is 2.46  m/s, and with an assumed right atrial pressure of 3 mmHg, the estimated right ventricular systolic pressure is 57.8 mmHg. Left Atrium: Left atrial size was normal in size. Right Atrium: Right atrial size was normal in size. Pericardium: Trivial pericardial effusion is present. Mitral Valve: 31 mm mosaic prosthetic valve with MG 7 mmHG @ 130 bpm. EOA 2.32 cm2. DI 1.6. Normal prosthesis with higher gradients due to tachycardia. The mitral valve has been repaired/replaced. No evidence of mitral valve regurgitation. There is a 31 mm Medtronic Mosaic bioprosthetic valve present in the mitral position. Procedure Date: 07/14/2016. MV peak gradient, 12.2 mmHg. The mean mitral valve gradient is 7.0 mmHg. Tricuspid Valve: The tricuspid valve is grossly normal. Tricuspid valve regurgitation is mild . No evidence of tricuspid stenosis. Aortic Valve: The aortic valve is tricuspid. Aortic valve regurgitation is not visualized. No aortic stenosis is present. Pulmonic Valve: The pulmonic valve was grossly normal. Pulmonic valve regurgitation is not visualized. No evidence of pulmonic stenosis. Aorta: The aortic root and ascending aorta are structurally normal, with no evidence of dilitation. Venous: The inferior vena cava is normal in size with greater than 50% respiratory variability, suggesting right  atrial pressure of 3 mmHg. IAS/Shunts: The atrial septum is grossly normal.  LEFT VENTRICLE PLAX 2D LVIDd:         3.50 cm LVIDs:         2.40 cm LV PW:         0.90 cm LV IVS:        0.90 cm LVOT diam:     2.20 cm LV SV:         49 LV SV Index:   24 LVOT Area:     3.80 cm  RIGHT VENTRICLE RV S prime:     9.03 cm/s TAPSE (M-mode): 1.3 cm LEFT ATRIUM             Index       RIGHT ATRIUM           Index LA diam:        2.80 cm 1.38 cm/m  RA Area:     16.00 cm LA Vol (A2C):   16.8 ml 8.27 ml/m  RA Volume:   44.60 ml  21.94 ml/m LA Vol (A4C):   32.1 ml 15.79 ml/m LA Biplane Vol: 25.2 ml 12.40 ml/m  AORTIC VALVE LVOT Vmax:   98.40 cm/s LVOT Vmean:  67.900 cm/s LVOT VTI:    0.128 m  AORTA Ao Root diam: 3.70 cm Ao Asc diam:  3.30 cm MITRAL VALVE  TRICUSPID VALVE MV Area VTI:  2.32 cm   TR Peak grad:   24.2 mmHg MV Peak grad: 12.2 mmHg  TR Vmax:        246.00 cm/s MV Mean grad: 7.0 mmHg MV Vmax:      1.75 m/s   SHUNTS MV Vmean:     122.0 cm/s Systemic VTI:  0.13 m MV VTI:       0.21 m     Systemic Diam: 2.20 cm Eleonore Chiquito MD Electronically signed by Eleonore Chiquito MD Signature Date/Time: 10/19/2020/1:35:36 PM    Final         Scheduled Meds: . digoxin  0.125 mg Oral Daily  . diltiazem  60 mg Oral Q12H  . pantoprazole  40 mg Oral BID  . peg 3350 powder  0.5 kit Oral Once   And  . peg 3350 powder  0.5 kit Oral Once  . umeclidinium bromide  1 puff Inhalation Daily   Continuous Infusions: . heparin 1,850 Units/hr (10/20/20 0944)     LOS: 3 days   Georgette Shell, MD  10/20/2020, 11:39 AM

## 2020-10-21 ENCOUNTER — Inpatient Hospital Stay (HOSPITAL_COMMUNITY): Payer: Medicare Other | Admitting: Anesthesiology

## 2020-10-21 ENCOUNTER — Encounter (HOSPITAL_COMMUNITY)
Admission: EM | Disposition: A | Discharge status: Home / Self Care | Payer: Self-pay | Source: Home / Self Care | Attending: Hospitalist

## 2020-10-21 ENCOUNTER — Encounter (HOSPITAL_COMMUNITY): Payer: Self-pay | Admitting: Internal Medicine

## 2020-10-21 DIAGNOSIS — D500 Iron deficiency anemia secondary to blood loss (chronic): Secondary | ICD-10-CM | POA: Diagnosis not present

## 2020-10-21 DIAGNOSIS — D509 Iron deficiency anemia, unspecified: Secondary | ICD-10-CM | POA: Diagnosis not present

## 2020-10-21 DIAGNOSIS — K5669 Other partial intestinal obstruction: Secondary | ICD-10-CM

## 2020-10-21 DIAGNOSIS — R195 Other fecal abnormalities: Secondary | ICD-10-CM | POA: Diagnosis not present

## 2020-10-21 DIAGNOSIS — K6389 Other specified diseases of intestine: Secondary | ICD-10-CM | POA: Diagnosis not present

## 2020-10-21 DIAGNOSIS — N400 Enlarged prostate without lower urinary tract symptoms: Secondary | ICD-10-CM

## 2020-10-21 DIAGNOSIS — Z952 Presence of prosthetic heart valve: Secondary | ICD-10-CM | POA: Diagnosis not present

## 2020-10-21 DIAGNOSIS — D490 Neoplasm of unspecified behavior of digestive system: Secondary | ICD-10-CM | POA: Diagnosis not present

## 2020-10-21 DIAGNOSIS — I484 Atypical atrial flutter: Secondary | ICD-10-CM | POA: Diagnosis not present

## 2020-10-21 DIAGNOSIS — I4892 Unspecified atrial flutter: Secondary | ICD-10-CM | POA: Diagnosis not present

## 2020-10-21 DIAGNOSIS — J449 Chronic obstructive pulmonary disease, unspecified: Secondary | ICD-10-CM | POA: Diagnosis not present

## 2020-10-21 HISTORY — PX: SIGMOIDOSCOPY: SHX6686

## 2020-10-21 HISTORY — PX: BIOPSY: SHX5522

## 2020-10-21 HISTORY — PX: SUBMUCOSAL INJECTION: SHX5543

## 2020-10-21 HISTORY — PX: ESOPHAGOGASTRODUODENOSCOPY (EGD) WITH PROPOFOL: SHX5813

## 2020-10-21 LAB — MAGNESIUM: Magnesium: 1.9 mg/dL (ref 1.7–2.4)

## 2020-10-21 LAB — CBC
HCT: 32.9 % — ABNORMAL LOW (ref 39.0–52.0)
Hemoglobin: 10.3 g/dL — ABNORMAL LOW (ref 13.0–17.0)
MCH: 22.5 pg — ABNORMAL LOW (ref 26.0–34.0)
MCHC: 31.3 g/dL (ref 30.0–36.0)
MCV: 71.8 fL — ABNORMAL LOW (ref 80.0–100.0)
Platelets: 63 10*3/uL — ABNORMAL LOW (ref 150–400)
RBC: 4.58 MIL/uL (ref 4.22–5.81)
RDW: 18.3 % — ABNORMAL HIGH (ref 11.5–15.5)
WBC: 6.7 10*3/uL (ref 4.0–10.5)
nRBC: 0 % (ref 0.0–0.2)

## 2020-10-21 LAB — COMPREHENSIVE METABOLIC PANEL
ALT: 18 U/L (ref 0–44)
AST: 50 U/L — ABNORMAL HIGH (ref 15–41)
Albumin: 3 g/dL — ABNORMAL LOW (ref 3.5–5.0)
Alkaline Phosphatase: 71 U/L (ref 38–126)
Anion gap: 9 (ref 5–15)
BUN: 22 mg/dL (ref 8–23)
CO2: 23 mmol/L (ref 22–32)
Calcium: 9.3 mg/dL (ref 8.9–10.3)
Chloride: 106 mmol/L (ref 98–111)
Creatinine, Ser: 1.14 mg/dL (ref 0.61–1.24)
GFR, Estimated: >60 mL/min (ref >60)
Glucose, Bld: 101 mg/dL — ABNORMAL HIGH (ref 70–99)
Potassium: 5.3 mmol/L — ABNORMAL HIGH (ref 3.5–5.1)
Sodium: 138 mmol/L (ref 135–145)
Total Bilirubin: 1.2 mg/dL (ref 0.3–1.2)
Total Protein: 7.8 g/dL (ref 6.5–8.1)

## 2020-10-21 LAB — HEPARIN LEVEL (UNFRACTIONATED): Heparin Unfractionated: 0.4 IU/mL (ref 0.30–0.70)

## 2020-10-21 SURGERY — ESOPHAGOGASTRODUODENOSCOPY (EGD) WITH PROPOFOL
Anesthesia: Monitor Anesthesia Care

## 2020-10-21 MED ORDER — METOPROLOL TARTRATE 12.5 MG HALF TABLET
12.5000 mg | ORAL_TABLET | Freq: Four times a day (QID) | ORAL | Status: DC
  Administered 2020-10-21 – 2020-10-22 (×3): 12.5 mg via ORAL
  Filled 2020-10-21 (×3): qty 1

## 2020-10-21 MED ORDER — LACTATED RINGERS IV SOLN: INTRAVENOUS | Status: DC | PRN

## 2020-10-21 MED ORDER — PROPOFOL 10 MG/ML IV BOLUS
INTRAVENOUS | Status: DC | PRN
  Administered 2020-10-21 (×2): 20 mg via INTRAVENOUS

## 2020-10-21 MED ORDER — SODIUM ZIRCONIUM CYCLOSILICATE 10 G PO PACK
10.0000 g | PACK | Freq: Once | ORAL | Status: AC
  Administered 2020-10-21: 10 g via ORAL
  Filled 2020-10-21: qty 1

## 2020-10-21 MED ORDER — ALUM & MAG HYDROXIDE-SIMETH 200-200-20 MG/5ML PO SUSP
30.0000 mL | ORAL | Status: DC | PRN
  Administered 2020-10-21 – 2020-10-30 (×3): 30 mL via ORAL
  Filled 2020-10-21 (×4): qty 30

## 2020-10-21 MED ORDER — PROPOFOL 500 MG/50ML IV EMUL
INTRAVENOUS | Status: DC | PRN
  Administered 2020-10-21: 50 ug/kg/min via INTRAVENOUS

## 2020-10-21 MED ORDER — PHENYLEPHRINE 40 MCG/ML (10ML) SYRINGE FOR IV PUSH (FOR BLOOD PRESSURE SUPPORT)
PREFILLED_SYRINGE | INTRAVENOUS | Status: DC | PRN
  Administered 2020-10-21: 120 ug via INTRAVENOUS
  Administered 2020-10-21 (×2): 80 ug via INTRAVENOUS
  Administered 2020-10-21: 120 ug via INTRAVENOUS
  Administered 2020-10-21 (×2): 80 ug via INTRAVENOUS
  Administered 2020-10-21 (×2): 120 ug via INTRAVENOUS
  Administered 2020-10-21: 80 ug via INTRAVENOUS

## 2020-10-21 SURGICAL SUPPLY — 24 items

## 2020-10-21 NOTE — Progress Notes (Signed)
ANTICOAGULATION CONSULT NOTE -  Follow up Pharmacy Consult for heparin Indication: atrial fibrillation  Allergies  Allergen Reactions  . Orange Juice [Orange Oil] Hives      Patient Measurements: Height: 6' (182.9 cm) Weight: 81.2 kg (179 lb) IBW/kg (Calculated) : 77.6 Heparin Dosing Weight:  81.2 kg  Vital Signs: Temp: 97 F (36.1 C) (05/23 0835) Temp Source: Temporal (05/23 0835) BP: 111/63 (05/23 0855) Pulse Rate: 128 (05/23 0855)  Labs: Recent Labs    10/19/20 0030 10/19/20 1001 10/20/20 0802 10/20/20 1812 10/21/20 0056  HGB 8.0*  --  8.2*  --  10.3*  HCT 25.9*  --  27.3*  --  32.9*  PLT 68*  --  69*  --  63*  HEPARINUNFRC <0.10*   < > 0.25* 0.27* 0.40  CREATININE  --   --  1.01  --  1.14   < > = values in this interval not displayed.    Estimated Creatinine Clearance: 60.5 mL/min (by C-G formula based on SCr of 1.14 mg/dL).   Medical History: Past Medical History:  Diagnosis Date  . Anemia   . Atrial fibrillation ([place])    post operative in 2018  . Atrial flutter ([place])   . Bacteremia 2018  . Basal cell carcinoma   . COPD (chronic obstructive pulmonary disease) ([place])   . Dementia ([place])   . Diabetes ([place])   . DVT of deep femoral vein ([place])   . Hypertension   . Thrombocytopenia (HCC)     Medications:  NA  Assessment: 4 yom with hx of bacterial endocarditis s/p mitral valve replacement in 2018, DVT, DM, and atrial fibrillation. Post mitral valve replacement with medtronic 31 mm prosthetic heart valve in 2018, patient did not follow regularly with medical treatment. No anticoagulation PTA. Patient found to be in atrial flutterPlan for TEE/DCCV during admission if tolerating AC. Of note, patient has open wounds 2/2 to basal cell carcinoma.   Heparin level this morning came back therapeutic at 0.4, on 1950 units/hr. Hgb 10.3, plt 63. No s/sx of bleeding. 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 Talked with cardiology - can discontinue heparin infusion at this time given not likely long term candidate for anticoagulation. HIT antibody ordered - no plans to anticoagulate for HIT unless labs results positive (plt chronically low).     Plan:  Discontinue heparin infusion and consult at this time  F/u HIT antibody results  Use SCDs for now - consider pharmacological DVT ppx pending results   Antonietta Jewel, PharmD, [place] Pharmacist  Phone: [phone] 10/21/2020 12:20 PM  Please check AMION for all [place] phone numbers After 10:00 PM, call [place] [phone]

## 2020-10-21 NOTE — Op Note (Signed)
[facility] Patient Name: [name] Procedure Date : 10/21/2020 MRN: [id number] Attending MD: Estill Cotta. Loletha Carrow , MD Date of Birth: [birth year] CSN: [id number] Age: 75 Admit Type: Inpatient Procedure:                Upper GI endoscopy Indications:              Iron deficiency anemia secondary to chronic blood                            loss, Heme positive stool Providers:                Mallie Mussel L. Loletha Carrow, MD, Josie Dixon, RN, Benetta Spar, Technician Referring MD:             [facility] Medicines:                Monitored Anesthesia Care Complications:            No immediate complications. Estimated Blood Loss:     Estimated blood loss: none. Procedure:                Pre-Anesthesia Assessment:                           - Prior to the procedure, a History and Physical                            was performed, and patient medications and                            allergies were reviewed. The patient's tolerance of                            previous anesthesia was also reviewed. The risks                            and benefits of the procedure and the sedation                            options and risks were discussed with the patient.                            All questions were answered, and informed consent                            was obtained. Prior Anticoagulants: The patient has                            taken no previous anticoagulant or antiplatelet                            agents. ASA Grade Assessment: IV - A patient with  severe systemic disease that is a constant threat                            to life. After reviewing the risks and benefits,                            the patient was deemed in satisfactory condition to                            undergo the procedure.                           After obtaining informed consent, the endoscope was                            passed  under direct vision. Throughout the                            procedure, the patient's blood pressure, pulse, and                            oxygen saturations were monitored continuously. The                            [id number]-H190 ([id number]) Olympus gastroscope was                            introduced through the mouth, and advanced to the                            second part of duodenum. The upper GI endoscopy was                            accomplished without difficulty. The patient                            tolerated the procedure well. Scope In: Scope Out: Findings:      The esophagus was normal.      The stomach was normal.      The cardia and gastric fundus were normal on retroflexion.      The examined duodenum was normal. Impression:               - Normal esophagus.                           - Normal stomach.                           - Normal examined duodenum.                           - No specimens collected. Recommendation:           - Return patient to hospital ward for ongoing care.                           -  Soft diet.                           - Continue present medications.                           - See the other procedure note for documentation of                            additional recommendations. Procedure Code(s):        --- Professional ---                           [phone], Esophagogastroduodenoscopy, flexible,                            transoral; diagnostic, including collection of                            specimen(s) by brushing or washing, when performed                            (separate procedure) Diagnosis Code(s):        --- Professional ---                           D50.0, Iron deficiency anemia secondary to blood                            loss (chronic)                           R19.5, Other fecal abnormalities CPT copyright 2019 American Medical Association. All rights reserved. The codes documented in this report are preliminary and  upon coder review may  be revised to meet current compliance requirements. Peachie Barkalow L. Loletha Carrow, MD 10/21/2020 8:31:54 AM This report has been signed electronically. Number of Addenda: 0

## 2020-10-21 NOTE — Anesthesia Preprocedure Evaluation (Signed)
Anesthesia Evaluation  Patient identified by MRN, date of birth, ID band Patient awake    Reviewed: Allergy & Precautions, H&P , NPO status , Patient's Chart, lab work & pertinent test results  Airway Mallampati: II   Neck ROM: full    Dental   Pulmonary COPD, Current Smoker,    breath sounds clear to auscultation       Cardiovascular hypertension, + DVT  + dysrhythmias Atrial Fibrillation + Valvular Problems/Murmurs  Rhythm:regular Rate:Normal  S/p MVR   Neuro/Psych PSYCHIATRIC DISORDERS Dementia    GI/Hepatic   Endo/Other  diabetes, Type 2  Renal/GU      Musculoskeletal   Abdominal   Peds  Hematology  (+) Blood dyscrasia, anemia ,   Anesthesia Other Findings   Reproductive/Obstetrics                             Anesthesia Physical Anesthesia Plan  ASA: III  Anesthesia Plan: MAC   Post-op Pain Management:    Induction: Intravenous  PONV Risk Score and Plan: 0 and Propofol infusion and Treatment may vary due to age or medical condition  Airway Management Planned: Nasal Cannula  Additional Equipment:   Intra-op Plan:   Post-operative Plan:   Informed Consent: I have reviewed the patients History and Physical, chart, labs and discussed the procedure including the risks, benefits and alternatives for the proposed anesthesia with the patient or authorized representative who has indicated his/her understanding and acceptance.     Dental advisory given  Plan Discussed with: CRNA, Anesthesiologist and Surgeon  Anesthesia Plan Comments:         Anesthesia Quick Evaluation

## 2020-10-21 NOTE — Transfer of Care (Signed)
Immediate Anesthesia Transfer of Care Note  Patient: [name]  Procedure(s) Performed: ESOPHAGOGASTRODUODENOSCOPY (EGD) WITH PROPOFOL (N/A ) BIOPSY SUBMUCOSAL INJECTION SIGMOIDOSCOPY  Patient Location: Endoscopy Unit  Anesthesia Type:MAC  Level of Consciousness: drowsy and patient cooperative  Airway & Oxygen Therapy: Patient Spontanous Breathing and Patient connected to nasal cannula oxygen  Post-op Assessment: Report given to RN and Post -op Vital signs reviewed and stable  Post vital signs: Reviewed and stable  Last Vitals:  Vitals Value Taken Time  BP    Temp    Pulse 126 10/21/20 0836  Resp 20 10/21/20 0836  SpO2 99 % 10/21/20 0836  Vitals shown include unvalidated device data.  Last Pain:  Vitals:   10/21/20 0705  TempSrc: Oral  PainSc: 0-No pain         Complications: No complications documented.

## 2020-10-21 NOTE — Interval H&P Note (Signed)
History and Physical Interval Note:  10/21/2020 7:44 AM  [name]  has presented today for surgery, with the diagnosis of anemia.  The various methods of treatment have been discussed with the patient and family. After consideration of risks, benefits and other options for treatment, the patient has consented to  Procedure(s): ESOPHAGOGASTRODUODENOSCOPY (EGD) WITH PROPOFOL (N/A) COLONOSCOPY WITH PROPOFOL (N/A) as a surgical intervention.  The patient's history has been reviewed, patient examined, no change in status, stable for surgery.  I have reviewed the patient's chart and labs.  Questions were answered to the patient's satisfaction.     GI consult note reviewed and signout received from Dr. Tarri Glenn. Patient was interviewed and examined in the preprocedure area, and I discussed the case with Dr. Marcie Bal of anesthesiology. Patient with iron deficiency anemia, heme positive stool, but also possibly additional marrow production problems with thrombocytopenia. Persistent A. fib with RVR despite digoxin and oral Cardizem (no Cardizem drip).  Systolic blood pressure 03-009 in preop area. Patient denies chest pain dyspnea or abdominal pain.  Better rate control would be ideal before endoscopic procedures, therefore increased risk procedure.  However, despite lack of overt GI bleeding, there is urgency to answer the question of where patient may have a source of occult GI blood loss since he may need cardioversion and therefore anticoagulation.  We will start with the upper endoscopy and see how the patient's hemodynamics tolerate that.  Colonoscopy will be performed if feasible.  [name]

## 2020-10-21 NOTE — Progress Notes (Addendum)
PROGRESS NOTE    [name]  WGN:[id number] DOB: [birth year] DOA: 10/17/2020 PCP: Pcp, No   Brief Narrative:75 years old male with PMH significant for bacterial endocarditis s/p mitral valve replacement in 2018, DVT, DM, HTN, dementia, tobacco use, COPD, atrial fibrillation, sarcoidosis, basal cell carcinoma who was transferred from echo lab to the ER with heart rate in 140s.  Patient denies any symptoms.  Patient states that he is unaware that his heart rate is elevated.  EKG shows atrial flutter with RVR.  Patient is also hypotensive.  He has not followed up with his regular doctor in several years.  Patient was seen by cardiologist and was started on digoxin and Cardizem p.o.  Apparently Cardizem IV drip dropped his blood pressure drastically.  Plan was to do TEE and DC cardioversion however over the weekend patient had guaiac positive stools and GI was consulted and patient had EGD and colonoscopy done on 10/21/2020 which showed a possible partially obstructing mass in the sigmoid colon. Assessment & Plan:   Active Problems:   Hx of bacterial endocarditis   H/O mitral valve replacement   Basal cell carcinoma (BCC) of skin of left wrist   Atrial flutter (HCC)   Microcytic anemia   COPD (chronic obstructive pulmonary disease) (HCC)   Atrial fibrillation with RVR (HCC)   Occult blood in stools   Iron deficiency anemia due to chronic blood loss  #1 A. fib/a flutter with RVR-he was rate controlled yesterday however his rate is back up again remains in RVR though he is asymptomatic.  Remains on Cardizem drip and digoxin.  Cardiology was planning TEE and DC cardioversion today however since he had guaiac positive stools over the weekend this was on hold as we needed to know if patient has any source of GI bleeding which could not allow [place] to use long-term/chronic anticoagulation. He probably is not a good candidate for long-term anticoagulation.  #2 history of mitral valve replacement and  infective endocarditis.  His ejection fraction is 65 to 70%.  Normal left ventricular function and no regional wall motion abnormalities.  #3 chronic iron deficient microcytic anemia patient is FOBT positive.  He denies melena or hematochezia.  His anemia panel is consistent with severe iron deficiency anemia.  He received a dose of Feraheme. 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 EGD was essentially normal.  #4 history of tobacco abuse-cessation advised.  He is not ready to give up smoking yet.  #5 basal cell carcinoma left hand covered with dressing.  #6 anemia with thrombocytopenia-with iron deficiency and guaiac positive stools with nonobstructing sigmoid mass with pending biopsy.  Patient has chronic thrombocytopenia which is worsened since going on anticoagulation.  HIT panel sent.  #7 hyperkalemia will give a dose of Lokelma check magnesium level   Estimated body mass index is 24.28 kg/m as calculated from the following:   Height as of this encounter: 6' (1.829 m).   Weight as of this encounter: 81.2 kg.  DVT prophylaxis: None due to GI bleed  code Status: Full code Family Communication: Discussed with the son Aaron Edelman over the phone disposition Plan:  Status is: Inpatient  Dispo: The patient is from: Home              Anticipated d/c is to: Home              Patient currently is not medically stable to d/c.   Difficult to  place patient No    Consultants:   Cardiology and GI  Procedures: None Antimicrobials: None Subjective: Patient seen after EGD and colonoscopy resting in bed was talking on the phone when I walked into the room.  He does not appear to be in any distress heart rate still elevated between 130s to mid 130s.  He denies any complaints or symptoms at this time. His only complaint was he did not get his breakfast this morning. Objective: Vitals:   10/21/20 0705 10/21/20 0835  10/21/20 0845 10/21/20 0855  BP: 108/69 (!) 92/55 (!) 101/58 111/63  Pulse: (!) 135 (!) 127 (!) 126 (!) 128  Resp:  19 (!) 23 15  Temp: 98.9 F (37.2 C) (!) 97 F (36.1 C)    TempSrc: Oral Temporal    SpO2: 96%  98% 99%  Weight:      Height:        Intake/Output Summary (Last 24 hours) at 10/21/2020 1057 Last data filed at 10/21/2020 7341 Gross per 24 hour  Intake 1320 ml  Output 1075 ml  Net 245 ml   Filed Weights   10/18/20 0451  Weight: 81.2 kg    Examination:  General exam: Appears chronically sick speech not very clear which is chronic at his baseline Respiratory system: Clear to auscultation. Respiratory effort normal. Cardiovascular system: S1 & S2 heard, irregular. No JVD, murmurs, rubs, gallops or clicks. No pedal edema. Gastrointestinal system: Abdomen is nondistended, soft and nontender. No organomegaly or masses felt. Normal bowel sounds heard. Central nervous system: Alert and oriented. No focal neurological deficits. Extremities: No edema, left hand covered with a dressing where he has basal cell carcinoma. Skin: No rashes, lesions or ulcers Psychiatry: Judgement and insight appear normal. Mood & affect appropriate.     Data Reviewed: I have personally reviewed following labs and imaging studies  CBC: Recent Labs  Lab 10/17/20 1446 10/18/20 0336 10/18/20 0904 10/19/20 0030 10/20/20 0802 10/21/20 0056  WBC 9.9 8.4  --  6.6 4.6 6.7  HGB 9.1* 7.8* 8.4* 8.0* 8.2* 10.3*  HCT 30.6* 26.2* 27.5* 25.9* 27.3* 32.9*  MCV 74.1* 74.6*  --  72.1* 74.2* 71.8*  PLT ACLMP 79*  --  68* 69* 63*   Basic Metabolic Panel: Recent Labs  Lab 10/17/20 1446 10/18/20 0336 10/20/20 0802 10/21/20 0056  NA 139 134* 136 138  K 3.8 4.2 3.9 5.3*  CL 105 104 106 106  CO2 26 23 24 23   GLUCOSE 91 88 90 101*  BUN 31* 30* 26* 22  CREATININE 1.13 1.08 1.01 1.14  CALCIUM 8.7* 8.0* 8.7* 9.3  MG 1.9  --   --   --    GFR: Estimated Creatinine Clearance: 60.5 mL/min (by C-G  formula based on SCr of 1.14 mg/dL). Liver Function Tests: Recent Labs  Lab 10/20/20 0802 10/21/20 0056  AST 16 50*  ALT 23 18  ALKPHOS 58 71  BILITOT 1.0 1.2  PROT 6.4* 7.8  ALBUMIN 2.3* 3.0*   No results for input(s): LIPASE, AMYLASE in the last 168 hours. No results for input(s): AMMONIA in the last 168 hours. Coagulation Profile: No results for input(s): INR, PROTIME in the last 168 hours. Cardiac Enzymes: No results for input(s): CKTOTAL, CKMB, CKMBINDEX, TROPONINI in the last 168 hours. BNP (last 3 results) No results for input(s): PROBNP in the last 8760 hours. HbA1C: No results for input(s): HGBA1C in the last 72 hours. CBG: No results for input(s): GLUCAP in the last 168 hours. Lipid Profile:  No results for input(s): CHOL, HDL, LDLCALC, TRIG, CHOLHDL, LDLDIRECT in the last 72 hours. Thyroid Function Tests: No results for input(s): TSH, T4TOTAL, FREET4, T3FREE, THYROIDAB in the last 72 hours. Anemia Panel: No results for input(s): VITAMINB12, FOLATE, FERRITIN, TIBC, IRON, RETICCTPCT in the last 72 hours. Sepsis Labs: No results for input(s): PROCALCITON, LATICACIDVEN in the last 168 hours.  Recent Results (from the past 240 hour(s))  SARS CORONAVIRUS 2 (TAT 6-24 HRS) Nasopharyngeal Nasopharyngeal Swab     Status: None   Collection Time: 10/17/20  8:02 PM   Specimen: Nasopharyngeal Swab  Result Value Ref Range Status   SARS Coronavirus 2 NEGATIVE NEGATIVE Final    Comment: (NOTE) SARS-CoV-2 target nucleic acids are NOT DETECTED.  The SARS-CoV-2 RNA is generally detectable in upper and lower respiratory specimens during the acute phase of infection. Negative results do not preclude SARS-CoV-2 infection, do not rule out co-infections with other pathogens, and should not be used as the sole basis for treatment or other patient management decisions. Negative results must be combined with clinical observations, patient history, and epidemiological information. The  expected result is Negative.  Fact Sheet for Patients: SugarRoll.be  Fact Sheet for Healthcare Providers: https://www.[employer].com/  This test is not yet approved or cleared by the [place] FDA and  has been authorized for detection and/or diagnosis of SARS-CoV-2 by FDA under an Emergency Use Authorization (EUA). This EUA will remain  in effect (meaning this test can be used) for the duration of the COVID-19 declaration under Se ction 564(b)(1) of the Act, 21 U.S.C. section 360bbb-3(b)(1), unless the authorization is terminated or revoked sooner.  Performed at [facility], [place] [address] [place], [place] [address]   MRSA PCR Screening     Status: None   Collection Time: 10/18/20  2:15 PM   Specimen: Nasopharyngeal  Result Value Ref Range Status   MRSA by PCR NEGATIVE NEGATIVE Final    Comment:        The GeneXpert MRSA Assay (FDA approved for NASAL specimens only), is one component of a comprehensive MRSA colonization surveillance program. It is not intended to diagnose MRSA infection nor to guide or monitor treatment for MRSA infections. Performed at [facility], [place] [address] [place],  [address]          Radiology Studies: No results found.      Scheduled Meds: . digoxin  0.125 mg Oral Daily  . diltiazem  60 mg Oral Q12H  . nicotine  14 mg Transdermal Daily  . pantoprazole  40 mg Oral BID  . umeclidinium bromide  1 puff Inhalation Daily   Continuous Infusions: . heparin Stopped (10/21/20 0500)     LOS: 4 days   Georgette Shell, MD  10/21/2020, 10:57 AM

## 2020-10-21 NOTE — Op Note (Signed)
[facility] Patient Name: [name] Procedure Date : 10/21/2020 MRN: [id number] Attending MD: Estill Cotta. Loletha Carrow , MD Date of Birth: March 17, 1946 CSN: [id number] Age: 75 Admit Type: Inpatient Procedure:                Colonoscopy Indications:              Heme positive stool, Iron deficiency anemia                            secondary to chronic blood loss Providers:                Mallie Mussel L. Loletha Carrow, MD, Josie Dixon, RN, Benetta Spar, Technician Referring MD:             [facility] Medicines:                Monitored Anesthesia Care Complications:            No immediate complications. Estimated Blood Loss:     Estimated blood loss was minimal. Procedure:                Pre-Anesthesia Assessment:                           - Prior to the procedure, a History and Physical                            was performed, and patient medications and                            allergies were reviewed. The patient's tolerance of                            previous anesthesia was also reviewed. The risks                            and benefits of the procedure and the sedation                            options and risks were discussed with the patient.                            All questions were answered, and informed consent                            was obtained. Prior Anticoagulants: The patient has                            taken no previous anticoagulant or antiplatelet                            agents. ASA Grade Assessment: IV - A patient with  severe systemic disease that is a constant threat                            to life. After reviewing the risks and benefits,                            the patient was deemed in satisfactory condition to                            undergo the procedure.                           - Prior to the procedure, a History and Physical                            was performed,  and patient medications and                            allergies were reviewed. The patient's tolerance of                            previous anesthesia was also reviewed. The risks                            and benefits of the procedure and the sedation                            options and risks were discussed with the patient.                            All questions were answered, and informed consent                            was obtained. Prior Anticoagulants: The patient has                            taken heparin, last dose was day of procedure. ASA                            Grade Assessment: IV - A patient with severe                            systemic disease that is a constant threat to life.                            After reviewing the risks and benefits, the patient                            was deemed in satisfactory condition to undergo the                            procedure.  After obtaining informed consent, the colonoscope                            was passed under direct vision. Throughout the                            procedure, the patient's blood pressure, pulse, and                            oxygen saturations were monitored continuously. The                            CF-HQ190L ([id number]) Olympus colonoscope was                            introduced through the anus with the intention of                            advancing to the cecum. The scope was advanced to                            the sigmoid colon before the procedure was aborted.                            Medications were given. The colonoscopy was                            performed with difficulty due to a partially                            obstructing mass and poor bowel prep. The patient                            tolerated the procedure well. The quality of the                            bowel preparation was poor. The rectum and sigmoid                             colon were photographed. The bowel preparation used                            was MoviPrep. Scope In: 8:05:38 AM Scope Out: 8:27:03 AM Total Procedure Duration: 0 hours 21 minutes 25 seconds  Findings:      The digital rectal exam findings include enlarged prostate.      A large amount of stool was found in the rectum and in the sigmoid       colon, making visualization difficult.      A partially obstructing mass was found in the sigmoid colon. The mass       was circumferential. The mass measured five to seven cm in length. There       was also edematous mucosa 2-3 cm distal to mass extent - ? intramural  extension. Contact bleeding. Biopsies were taken with a cold forceps for       histology. Area was tattooed with an injection of 1.5 mL of Spot - 0.5cc       proximal to two 0.5 cc injections distal. Visualization poor. Adult       scope able to pass through mass area with mild resistance.      The exam was otherwise without abnormality. See above - note that exam       was incomplete to sigmoid colon due to poor preparation. Impression:               - Preparation of the colon was poor.                           - Enlarged prostate found on digital rectal exam.                           - Stool in the rectum and in the sigmoid colon.                           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 Biopsied. Tattooed.                           - The examination was otherwise normal. Recommendation:           - Return patient to hospital ward for ongoing care.                           - Soft diet.                           - Unfractionated heparin can be resumed (without a                            bolus) upon return to hospital ward.                           Assistance from cardiology today for control of                            Afib RVR                           CT scan chest/abdomen/pelvis with oral and IV                             contrast for tumor staging.                           CEA with tomorrow AM labs.                           - Await pathology results.                           -  The findings and recommendations were discussed                            with the patient's family (son Aaron Edelman by phone).                            Message sent to hospitalist. Procedure Code(s):        --- Professional ---                           [phone], 75, Colonoscopy, flexible; with directed                            submucosal injection(s), any substance                           45380, 20, Colonoscopy, flexible; with biopsy,                            single or multiple Diagnosis Code(s):        --- Professional ---                           D49.0, Neoplasm of unspecified behavior of                            digestive system                           K56.690, Other partial intestinal obstruction                           R19.5, Other fecal abnormalities                           D50.0, Iron deficiency anemia secondary to blood                            loss (chronic)                           N40.0, Benign prostatic hyperplasia without lower                            urinary tract symptoms CPT copyright 2019 American Medical Association. All rights reserved. The codes documented in this report are preliminary and upon coder review may  be revised to meet current compliance requirements. Quantarius Genrich L. Loletha Carrow, MD 10/21/2020 8:49:15 AM This report has been signed electronically. Number of Addenda: 0

## 2020-10-21 NOTE — Progress Notes (Signed)
Progress Note  Patient Name: [name] Date of Encounter: 10/21/2020  [facility] Cardiologist: None   Subjective   Underwent EGD and colonoscopy this AM. 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  He is aware of the tumor seen on scope today. He feels "fine" in his current rhythm, which is 2:1 atrial flutter with v rates in the 130s. Discussed that he is high risk for anticoagulation, which limits our options. He wants to go home but understands he will need more testing.  Inpatient Medications    Scheduled Meds: . digoxin  0.125 mg Oral Daily  . diltiazem  60 mg Oral Q12H  . metoprolol tartrate  12.5 mg Oral Q6H  . nicotine  14 mg Transdermal Daily  . umeclidinium bromide  1 puff Inhalation Daily   Continuous Infusions:  PRN Meds: levalbuterol   Vital Signs    Vitals:   10/21/20 0835 10/21/20 0845 10/21/20 0855 10/21/20 1118  BP: (!) 92/55 (!) 101/58 111/63 99/71  Pulse: (!) 127 (!) 126 (!) 128 (!) 130  Resp: 19 (!) 23 15   Temp: (!) 97 F (36.1 C)   98.2 F (36.8 C)  TempSrc: Temporal   Oral  SpO2:  98% 99% 98%  Weight:      Height:        Intake/Output Summary (Last 24 hours) at 10/21/2020 1422 Last data filed at 10/21/2020 1120 Gross per 24 hour  Intake 1200 ml  Output 950 ml  Net 250 ml   Last 3 Weights 10/18/2020 09/03/2020 01/29/2020  Weight (lbs) 179 lb 165 lb 6 oz 176 lb  Weight (kg) 81.194 kg 75.014 kg 79.833 kg      Telemetry    2:1 atrial flutter - Personally Reviewed  ECG    Atrial flutter 10/17/20 - Personally Reviewed  Physical Exam   GEN: No acute distress.   Neck: No JVD Cardiac: tachycardic but regular, no appreciable murmurs, rubs, or gallops.  Respiratory: Clear to auscultation bilaterally. GI: Soft, nontender, non-distended  MS: No edema; No deformity. Neuro:  Nonfocal  Psych: Normal affect  Skin: diffuse  ecchymosis  Labs    High Sensitivity Troponin:   Recent Labs  Lab 10/17/20 1446 10/17/20 1646  TROPONINIHS 5 6      Chemistry Recent Labs  Lab 10/18/20 0336 10/20/20 0802 10/21/20 0056  NA 134* 136 138  K 4.2 3.9 5.3*  CL 104 106 106  CO2 23 24 23   GLUCOSE 88 90 101*  BUN 30* 26* 22  CREATININE 1.08 1.01 1.14  CALCIUM 8.0* 8.7* 9.3  PROT  --  6.4* 7.8  ALBUMIN  --  2.3* 3.0*  AST  --  16 50*  ALT  --  23 18  ALKPHOS  --  58 71  BILITOT  --  1.0 1.2  GFRNONAA >60 >60 >60  ANIONGAP 7 6 9      Hematology Recent Labs  Lab 10/19/20 0030 10/20/20 0802 10/21/20 0056  WBC 6.6 4.6 6.7  RBC 3.59* 3.68* 4.58  HGB 8.0* 8.2* 10.3*  HCT 25.9* 27.3* 32.9*  MCV 72.1* 74.2* 71.8*  MCH 22.3* 22.3* 22.5*  MCHC 30.9 30.0 31.3  RDW 17.2* 17.5* 18.3*  PLT 68* 69* 63*    BNPNo results for input(s): BNP, PROBNP in the last 168 hours.   DDimer No results for input(s): DDIMER in the last 168 hours.   Radiology  No results found.  Cardiac Studies   Echo 10/19/20 1. Left ventricular ejection fraction, by estimation, is 65 to 70%. The  left ventricle has normal function. The left ventricle has no regional  wall motion abnormalities. Left ventricular diastolic function could not  be evaluated.  2. Right ventricular systolic function is normal. The right ventricular  size is mildly enlarged. There is normal pulmonary artery systolic  pressure. The estimated right ventricular systolic pressure is 23.5 mmHg.  3. 31 mm mosaic prosthetic valve with MG 7 mmHG @ 130 bpm. EOA 2.32 cm2.  DI 1.6. Normal prosthesis with higher gradients due to tachycardia. The  mitral valve has been repaired/replaced. No evidence of mitral valve  regurgitation. There is a 31 mm  Medtronic Mosaic bioprosthetic valve present in the mitral position.  Procedure Date: 07/14/2016.  4. The aortic valve is tricuspid. Aortic valve regurgitation is not  visualized. No aortic stenosis is present.  5.  The inferior vena cava is normal in size with greater than 50%  respiratory variability, suggesting right atrial pressure of 3 mmHg  Patient Profile     75 y.o. 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 Complicated by anemia, now found to have colon mass.  Assessment & Plan    Atrial flutter: Extremely difficult situation. We cannot anticoagulate due to microcytic anemia, now with partially obstructing colon mass concerning for cancer. He also has chronic thrombocytopenia. Has been difficult to rate control, limited by low blood pressure. Still elevated heart rates in the 130s despite digoxin and diltiazem, with borderline hypotension.  He cannot be anticoagulated at this time, so electrical cardioversion is out. Chemical cardioversion also carries risk of stroke. However, we are out of agents for rate control without potentially worsening hypotension. I am going to trial metoprolol and attempt to wean diltiazem, as this is more likely to cause hypotension than metoprolol, but if we cannot get rate control, we may be forced to trial IV amiodarone. This would primarily be for rate control, but it can cause chemical cardioversion. Without anticoagulation, there is a risk of stroke.  Patient feels well despite his heart rate. Will trial addition of metoprolol for now. If he becomes more hypotensive or unstable, then benefit of amiodarone would outweigh risks.  He has high risk of decompensation. Complex medical decision making.  For questions or updates, please contact [place] Please consult [email or web address] for contact info under        Signed, Buford Dresser, MD  10/21/2020, 2:22 PM

## 2020-10-22 ENCOUNTER — Inpatient Hospital Stay (HOSPITAL_COMMUNITY): Payer: Medicare Other

## 2020-10-22 ENCOUNTER — Encounter (HOSPITAL_COMMUNITY): Payer: Self-pay | Admitting: Internal Medicine

## 2020-10-22 DIAGNOSIS — D500 Iron deficiency anemia secondary to blood loss (chronic): Secondary | ICD-10-CM | POA: Diagnosis not present

## 2020-10-22 DIAGNOSIS — D696 Thrombocytopenia, unspecified: Secondary | ICD-10-CM | POA: Diagnosis not present

## 2020-10-22 DIAGNOSIS — C44619 Basal cell carcinoma of skin of left upper limb, including shoulder: Secondary | ICD-10-CM | POA: Diagnosis not present

## 2020-10-22 DIAGNOSIS — Z952 Presence of prosthetic heart valve: Secondary | ICD-10-CM | POA: Diagnosis not present

## 2020-10-22 DIAGNOSIS — D630 Anemia in neoplastic disease: Secondary | ICD-10-CM

## 2020-10-22 DIAGNOSIS — D509 Iron deficiency anemia, unspecified: Secondary | ICD-10-CM | POA: Diagnosis not present

## 2020-10-22 DIAGNOSIS — C187 Malignant neoplasm of sigmoid colon: Secondary | ICD-10-CM | POA: Diagnosis not present

## 2020-10-22 DIAGNOSIS — I4891 Unspecified atrial fibrillation: Secondary | ICD-10-CM | POA: Diagnosis not present

## 2020-10-22 DIAGNOSIS — I4892 Unspecified atrial flutter: Secondary | ICD-10-CM | POA: Diagnosis not present

## 2020-10-22 LAB — CBC
HCT: 24.2 % — ABNORMAL LOW (ref 39.0–52.0)
Hemoglobin: 7.4 g/dL — ABNORMAL LOW (ref 13.0–17.0)
MCH: 22.1 pg — ABNORMAL LOW (ref 26.0–34.0)
MCHC: 30.6 g/dL (ref 30.0–36.0)
MCV: 72.2 fL — ABNORMAL LOW (ref 80.0–100.0)
Platelets: 72 10*3/uL — ABNORMAL LOW (ref 150–400)
RBC: 3.35 MIL/uL — ABNORMAL LOW (ref 4.22–5.81)
RDW: 17.7 % — ABNORMAL HIGH (ref 11.5–15.5)
WBC: 6.1 10*3/uL (ref 4.0–10.5)
nRBC: 0 % (ref 0.0–0.2)

## 2020-10-22 LAB — SURGICAL PATHOLOGY

## 2020-10-22 LAB — COMPREHENSIVE METABOLIC PANEL
ALT: 18 U/L (ref 0–44)
AST: 12 U/L — ABNORMAL LOW (ref 15–41)
Albumin: 2.3 g/dL — ABNORMAL LOW (ref 3.5–5.0)
Alkaline Phosphatase: 57 U/L (ref 38–126)
Anion gap: 6 (ref 5–15)
BUN: 25 mg/dL — ABNORMAL HIGH (ref 8–23)
CO2: 27 mmol/L (ref 22–32)
Calcium: 8.7 mg/dL — ABNORMAL LOW (ref 8.9–10.3)
Chloride: 104 mmol/L (ref 98–111)
Creatinine, Ser: 1.25 mg/dL — ABNORMAL HIGH (ref 0.61–1.24)
GFR, Estimated: 60 mL/min — ABNORMAL LOW (ref >60)
Glucose, Bld: 103 mg/dL — ABNORMAL HIGH (ref 70–99)
Potassium: 4.2 mmol/L (ref 3.5–5.1)
Sodium: 137 mmol/L (ref 135–145)
Total Bilirubin: 0.7 mg/dL (ref 0.3–1.2)
Total Protein: 5.8 g/dL — ABNORMAL LOW (ref 6.5–8.1)

## 2020-10-22 LAB — SAVE SMEAR(SSMR), FOR PROVIDER SLIDE REVIEW

## 2020-10-22 LAB — HEPARIN INDUCED PLATELET AB (HIT ANTIBODY): Heparin Induced Plt Ab: 0.154 OD (ref 0.000–0.400)

## 2020-10-22 LAB — ABO/RH: ABO/RH(D): AB POS

## 2020-10-22 LAB — PREPARE RBC (CROSSMATCH)

## 2020-10-22 LAB — DIGOXIN LEVEL: Digoxin Level: 0.6 ng/mL — ABNORMAL LOW (ref 0.8–2.0)

## 2020-10-22 LAB — TSH: TSH: 1.445 u[IU]/mL (ref 0.350–4.500)

## 2020-10-22 LAB — PREALBUMIN: Prealbumin: 9.2 mg/dL — ABNORMAL LOW (ref 18–38)

## 2020-10-22 MED ORDER — DILTIAZEM HCL ER COATED BEADS 120 MG PO CP24
120.0000 mg | ORAL_CAPSULE | Freq: Every day | ORAL | Status: DC
  Administered 2020-10-22 – 2020-10-31 (×10): 120 mg via ORAL
  Filled 2020-10-22 (×10): qty 1

## 2020-10-22 MED ORDER — METOPROLOL SUCCINATE ER 25 MG PO TB24
50.0000 mg | ORAL_TABLET | Freq: Every day | ORAL | Status: DC
  Administered 2020-10-22 – 2020-10-27 (×6): 50 mg via ORAL
  Filled 2020-10-22 (×3): qty 1
  Filled 2020-10-22 (×3): qty 2

## 2020-10-22 MED ORDER — ACETAMINOPHEN 325 MG PO TABS
650.0000 mg | ORAL_TABLET | ORAL | Status: DC | PRN
  Administered 2020-10-22: 650 mg via ORAL
  Filled 2020-10-22: qty 2

## 2020-10-22 MED ORDER — IOHEXOL 9 MG/ML PO SOLN
ORAL | Status: AC
  Administered 2020-10-22: 500 mL
  Filled 2020-10-22: qty 1000

## 2020-10-22 MED ORDER — IOHEXOL 300 MG/ML  SOLN
100.0000 mL | Freq: Once | INTRAMUSCULAR | Status: AC | PRN
  Administered 2020-10-22: 100 mL via INTRAVENOUS

## 2020-10-22 MED ORDER — ENSURE ENLIVE PO LIQD: 237.0000 mL | Freq: Two times a day (BID) | ORAL | Status: DC

## 2020-10-22 MED ORDER — SODIUM CHLORIDE 0.9% IV SOLUTION: Freq: Once | INTRAVENOUS | Status: DC

## 2020-10-22 MED ORDER — BOOST / RESOURCE BREEZE PO LIQD CUSTOM
1.0000 | Freq: Three times a day (TID) | ORAL | Status: DC
  Administered 2020-10-22 – 2020-10-23 (×4): 1 via ORAL

## 2020-10-22 MED ORDER — PROSOURCE PLUS PO LIQD
30.0000 mL | Freq: Two times a day (BID) | ORAL | Status: DC
  Administered 2020-10-23 (×2): 30 mL via ORAL
  Filled 2020-10-22: qty 30

## 2020-10-22 NOTE — Plan of Care (Signed)
  Problem: Education: Goal: Knowledge of General Education information will improve Description: Including pain rating scale, medication(s)/side effects and non-pharmacologic comfort measures 10/22/2020 0042 by Roslyn Smiling, RN Outcome: Progressing 10/22/2020 0041 by Roslyn Smiling, RN Outcome: Progressing   Problem: Health Behavior/Discharge Planning: Goal: Ability to manage health-related needs will improve Outcome: Progressing   Problem: Clinical Measurements: Goal: Ability to maintain clinical measurements within normal limits will improve 10/22/2020 0042 by Zenia Guest, Kristopher Glee, RN Outcome: Progressing 10/22/2020 0041 by Roslyn Smiling, RN Outcome: Progressing Goal: Will remain free from infection Outcome: Progressing Goal: Diagnostic test results will improve 10/22/2020 0042 by Shaheim Mahar, Kristopher Glee, RN Outcome: Progressing 10/22/2020 0041 by Roslyn Smiling, RN Outcome: Progressing Goal: Respiratory complications will improve 10/22/2020 0042 by Roslyn Smiling, RN Outcome: Progressing 10/22/2020 0041 by Roslyn Smiling, RN Outcome: Progressing Goal: Cardiovascular complication will be avoided Outcome: Progressing   Problem: Activity: Goal: Risk for activity intolerance will decrease 10/22/2020 0042 by Finley Chevez, Kristopher Glee, RN Outcome: Progressing 10/22/2020 0041 by Roslyn Smiling, RN Outcome: Progressing   Problem: Nutrition: Goal: Adequate nutrition will be maintained Outcome: Progressing   Problem: Coping: Goal: Level of anxiety will decrease Outcome: Progressing   Problem: Elimination: Goal: Will not experience complications related to urinary retention Outcome: Progressing   Problem: Pain Managment: Goal: General experience of comfort will improve Outcome: Progressing   Problem: Safety: Goal: Ability to remain free from injury will improve Outcome: Progressing

## 2020-10-22 NOTE — Progress Notes (Addendum)
Progress Note  Chief Complaint:    anemia     ASSESSMENT / PLAN:    Brief history:  75 yo male with pmh of Afib, porcine MVP, COPD, DM, DVT, HTN, dementia. Admitted 5/19 with A. Flutter with RVR. Plan was cardioverson and anticoagulation. We were asked to evaluated after patient found on admission to be anemic with heme positive stools. 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 Staging CT scans and CEA have been ordered. TRH has already spoken to General Surgery and Oncology.  --Hgb 7.4 today.      SUBJECTIVE:   No complaints.   Denies abdominal pain, chest pain or dyspnea.   OBJECTIVE:    Scheduled inpatient medications:  . sodium chloride   Intravenous Once  . digoxin  0.125 mg Oral Daily  . diltiazem  120 mg Oral QHS  . diltiazem  60 mg Oral Q12H  . feeding supplement  237 mL Oral BID BM  . metoprolol succinate  50 mg Oral Daily  . nicotine  14 mg Transdermal Daily  . umeclidinium bromide  1 puff Inhalation Daily   Continuous inpatient infusions:  PRN inpatient medications: alum & mag hydroxide-simeth, levalbuterol  Vital signs in last 24 hours: Temp:  [98.3 F (36.8 C)-98.7 F (37.1 C)] 98.7 F (37.1 C) (05/24 1146) Pulse Rate:  [65-113] 99 (05/24 1146) Resp:  [14-17] 16 (05/24 0900) BP: (84-106)/(50-68) 96/68 (05/24 1146) SpO2:  [95 %-100 %] 95 % (05/24 1146) Last BM Date: 10/22/20 No intake or output data in the 24 hours ending 10/22/20 1359   Physical Exam:  . General: Alert male in NAD . Heart:  Irreg  rate and rhythm.  . Pulmonary: Normal respiratory effort . Abdomen: Soft, nondistended, nontender. Normal bowel sounds.  . Neurologic: Alert and oriented . Psych: Pleasant. Cooperative.   Filed Weights   10/18/20 0451  Weight: 81.2 kg    Intake/Output from previous day: 05/23 0701 - 05/24 0700 In: 200 [I.V.:200] Out: 150 [Urine:150] Intake/Output this shift: No intake/output data  recorded.    Lab Results: Recent Labs    10/20/20 0802 10/21/20 0056 10/22/20 0131  WBC 4.6 6.7 6.1  HGB 8.2* 10.3* 7.4*  HCT 27.3* 32.9* 24.2*  PLT 69* 63* 72*   BMET Recent Labs    10/20/20 0802 10/21/20 0056 10/22/20 0131  NA 136 138 137  K 3.9 5.3* 4.2  CL 106 106 104  CO2 24 23 27   GLUCOSE 90 101* 103*  BUN 26* 22 25*  CREATININE 1.01 1.14 1.25*  CALCIUM 8.7* 9.3 8.7*   LFT Recent Labs    10/22/20 0131  PROT 5.8*  ALBUMIN 2.3*  AST 12*  ALT 18  ALKPHOS 57  BILITOT 0.7   PT/INR No results for input(s): LABPROT, INR in the last 72 hours. Hepatitis Panel No results for input(s): HEPBSAG, HCVAB, HEPAIGM, HEPBIGM in the last 72 hours.  No results found.   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 I personally interviewed and examined the patient.  Newly diagnosed sigmoid colon adenocarcinoma.  I received a call from the pathologist earlier today with the final pathology result.  Afterward I spoke with Dr. Waldron Labs of the [facility] service, who had seen the result already, spoke to oncology and surgery and ordered staging CT scan chest abdomen and pelvis. He also feels that the anemia and malnutrition are probably contributing to the difficult control of the patient's atrial fibrillation with RVR.  Nothing further to add from our standpoint, our service will sign off and you may call us as needed.  Thank you for involving [place] in his care.   25 minutes were spent on this encounter (including chart review, history/exam,  counseling/coordination of care, and documentation) > 50% of that time was spent on counseling and coordination of care.    [name] Office: [phone]

## 2020-10-22 NOTE — Progress Notes (Signed)
Initial Nutrition Assessment  DOCUMENTATION CODES:   Severe malnutrition in context of chronic illness  INTERVENTION:   - Boost Breeze po TID, each supplement provides 250 kcal and 9 grams of protein  - ProSource Plus 30 ml po BID, each supplement provides 100 kcal and 15 grams of protein  NUTRITION DIAGNOSIS:   Severe Malnutrition related to chronic illness (COPD, new adenocarcinoma of the colon) as evidenced by severe muscle depletion,severe fat depletion,percent weight loss (8.4% weight loss in less than 2 months).  GOAL:   Patient will meet greater than or equal to 90% of their needs  MONITOR:   PO intake,Supplement acceptance,Diet advancement,Labs,Weight trends,Skin  REASON FOR ASSESSMENT:   Consult Assessment of nutrition requirement/status  ASSESSMENT:   75 year old male who presented to the ED on 5/19 with tachycardia. PMH of bacteria endocarditis s/p MVR in 2018, DVT, DM, HTN, dementia, tobacco use, COPD, atrial fibrillation, basal cell carcinoma on L wrist/hand.   GI consulted for anemia and heme positive stool. Pt underwent EGD and colonoscopy on 5/23. Colonoscopy showed "likely malignant partially obstructing tumor in the sigmoid colon." Biopsy taken shows adenocarcinoma. Surgery consulted and states pt will benefit from resection. Awaiting CT scan results for surgical planning.  Pt currently on a clear liquid diet pending surgery. RD to order appropriate oral nutrition supplements to maximize nutritional status prior to surgery.  Spoke with pt at bedside. Pt reports that he has a good appetite. Noted ~90% completed breakfast meal tray still in room. No lunch meal tray noted. Pt reports that at home he eats 3 meals a day.  Breakfast: bowl of cereal and coffee Lunch: sandwich, chips, mountain dew Dinner: hotdog or hamburger  Pt believes that he lost some weight recently. He reports that he used to weigh 185 lbs but that this was a long time ago. RD obtained bed  weight at time of visit: 68.7 kg.  Wt Readings from Last 5 Encounters:  10/22/20 68.7 kg  09/03/20 75 kg  01/29/20 79.8 kg  12/08/19 79.4 kg   Pt with a 6.3 kg weight loss since 09/03/20. This is an 8.4% weight loss in less than 2 months which is significant for timeframe. Pt meets criteria for severe malnutrition.  Pt frustrated to hear that he is back on a clear liquid diet. RN in room confirms pt on clear liquid diet. Pt willing to try clear liquid supplements. Discussed importance of adequate nutrition with pt in promoting healing and lean muscle mass maintenance.  Medications reviewed.  Labs reviewed: BUN 25, creatinine 1.25, hemoglobin 7.4  NUTRITION - FOCUSED PHYSICAL EXAM:  Flowsheet Row Most Recent Value  Orbital Region Severe depletion  Upper Arm Region Severe depletion  Thoracic and Lumbar Region Severe depletion  Buccal Region Severe depletion  Temple Region Severe depletion  Clavicle Bone Region Severe depletion  Clavicle and Acromion Bone Region Severe depletion  Scapular Bone Region Severe depletion  Dorsal Hand Moderate depletion  Patellar Region Moderate depletion  Anterior Thigh Region Severe depletion  Posterior Calf Region Severe depletion  Edema (RD Assessment) None  Hair Reviewed  Eyes Reviewed  Mouth Reviewed  Skin Reviewed  Nails Reviewed       Diet Order:   Diet Order            Diet clear liquid Room service appropriate? Yes; Fluid consistency: Thin  Diet effective now                 EDUCATION NEEDS:   Education needs have  been addressed  Skin:  Skin Assessment: Skin Integrity Issues: Other: basal cell carcinoma x 2 on LUE  Last BM:  10/21/20  Height:   Ht Readings from Last 1 Encounters:  10/18/20 6' (1.829 m)    Weight:   Wt Readings from Last 1 Encounters:  10/22/20 68.7 kg    BMI:  Body mass index is 20.54 kg/m.  Estimated Nutritional Needs:   Kcal:  2000-2200  Protein:  100-115 grams  Fluid:  >/= 2.0  L    Gustavus Bryant, MS, RD, LDN Inpatient Clinical Dietitian Please see AMiON for contact information.

## 2020-10-22 NOTE — Plan of Care (Signed)

## 2020-10-22 NOTE — Care Management Important Message (Signed)
Important Message  Patient Details  Name: [name] MRN: [id number] Date of Birth: 04-Oct-1945   Medicare Important Message Given:  Yes     [name] 10/22/2020, 3:57 PM

## 2020-10-22 NOTE — Anesthesia Postprocedure Evaluation (Signed)
Anesthesia Post Note  Patient: JOWAN SKILLIN  Procedure(s) Performed: ESOPHAGOGASTRODUODENOSCOPY (EGD) WITH PROPOFOL (N/A ) BIOPSY SUBMUCOSAL INJECTION SIGMOIDOSCOPY     Patient location during evaluation: Endoscopy Anesthesia Type: MAC Level of consciousness: awake and alert Pain management: pain level controlled Vital Signs Assessment: post-procedure vital signs reviewed and stable Respiratory status: spontaneous breathing, nonlabored ventilation, respiratory function stable and patient connected to nasal cannula oxygen Cardiovascular status: stable and blood pressure returned to baseline Postop Assessment: no apparent nausea or vomiting Anesthetic complications: no   No complications documented.  Last Vitals:  Vitals:   10/22/20 0731 10/22/20 0734  BP: 106/62   Pulse: (!) 113   Resp:    Temp: 36.8 C   SpO2: 96% 100%    Last Pain:  Vitals:   10/22/20 0731  TempSrc: Oral  PainSc:                  Alderton S

## 2020-10-22 NOTE — Progress Notes (Signed)
PROGRESS NOTE    [name]  AJO:[id number] DOB: 02/26/1946 DOA: 10/17/2020 PCP: Pcp, No   Brief Narrative:  75 years old male with PMH significant for bacterial endocarditis s/p mitral valve replacement in 2018, DVT, DM, HTN, dementia, tobacco use, COPD, atrial fibrillation, sarcoidosis, basal cell carcinoma who was transferred from echo lab to the ER with heart rate in 140s.  Patient denies any symptoms.  Patient states that he is unaware that his heart rate is elevated.  EKG shows atrial flutter with RVR.  Patient is also hypotensive.  He has not followed up with his regular doctor in several years.  Patient was seen by cardiologist and was started on digoxin and Cardizem p.o.  Apparently Cardizem IV drip dropped his blood pressure drastically.  Plan was to do TEE and DC cardioversion however over the weekend patient had guaiac positive stools and GI was consulted and patient had EGD and colonoscopy done on 10/21/2020 which showed a possible partially obstructing mass in the sigmoid colon.  Allergy confirms adenocarcinoma.  Assessment & Plan:   Active Problems:   Hx of bacterial endocarditis   H/O mitral valve replacement   Basal cell carcinoma (BCC) of skin of left wrist   Atrial flutter (HCC)   Microcytic anemia   COPD (chronic obstructive pulmonary disease) (HCC)   Atrial fibrillation with RVR (HCC)   Occult blood in stools   Iron deficiency anemia due to chronic blood loss  A. fib/a flutter with RVR -Heart rate difficult to control in the setting of soft blood pressure  -Due to lower GI bleed is not a candidate for cardioversion. -Management per cardiology, he is on p.o. Cardizem, amiodarone and digoxin. -We will check TSH  History of mitral valve replacement and infective endocarditis.   - His ejection fraction is 65 to 70%.  Normal left ventricular function and no regional wall motion abnormalities.  Chronic blood loss anemia/iron deficiency anemia  -This is secondary  to partially obstructing sigmoid adenocarcinoma . -Received IV iron .  Anemia panel consistent with severe iron deficiency anemia -Hemoglobin is 7.4 this morning, I will transfuse 1 unit PRBC in the setting of RVR and soft blood pressure . -GI input greatly appreciated, please see chronic iron deficient microcytic anemia patient is FOBT positive.  He  - Appreciate GI input colonoscopy shows likely malignant partially obstructing tumor in the sigmoid colon which was biopsied. - EGD was essentially normal.  Point adenocarcinoma -Back significant for adenocarcinoma, colonoscopy 5/23 significant for near obstructing mass, discussed with Dr. Benay Spice, mentation for CT chest/abdomen/pelvis for staging, and consult. -We will change to clear liquid diet, and start nutritional supplement, consult nutritionist to optimize nutritional status in case he will need surgery.  history of tobacco abuse-cessation advised.  He is not ready to give up smoking yet.  basal cell carcinoma left hand covered with dressing.  anemia with thrombocytopenia-with iron deficiency and guaiac positive stools with nonobstructing sigmoid mass with pending biopsy.  Patient has chronic thrombocytopenia which is worsened since going on anticoagulation.  HIT panel sent.  hyperkalemia will give a dose of Lokelma check magnesium level   Estimated body mass index is 24.28 kg/m as calculated from the following:   Height as of this encounter: 6' (1.829 m).   Weight as of this encounter: 81.2 kg.  DVT prophylaxis: None due to GI bleed ,SCD code Status: Full code Family Communication: Discussed with the son Aaron Edelman over the phone disposition Plan:  Status is: Inpatient  Dispo: The  patient is from: Home              Anticipated d/c is to: Home              Patient currently is not medically stable to d/c.   Difficult to place patient No    Consultants:   Cardiology   GI  General surgery  Procedures: EGD/colonoscopy  5/23  Antimicrobials: None  Subjective:  ED with soft blood pressure, A. fib with RVR overnight, he himself asking when he can go home, he denies any chest pain, or shortness of breath, but he does report weakness and poor appetite  Objective: Vitals:   10/22/20 0900 10/22/20 0910 10/22/20 1145 10/22/20 1146  BP: (!) 84/50 (!) 93/57  96/68  Pulse: 91  (!) 104 99  Resp: 16     Temp:    98.7 F (37.1 C)  TempSrc:    Oral  SpO2: 97%   95%  Weight:      Height:       No intake or output data in the 24 hours ending 10/22/20 1217 Filed Weights   10/18/20 0451  Weight: 81.2 kg    Examination:  Awake Alert, Oriented X 3, frail, weak and deconditioned no new F.N deficits, Normal affect Symmetrical Chest wall movement, Good air movement bilaterally, CTAB Regular irregular,No Gallops,Rubs or new Murmurs, No Parasternal Heave +ve B.Sounds, Abd Soft, No tenderness, No rebound - guarding or rigidity. No Cyanosis, Clubbing or edema, No new Rash or bruise      Data Reviewed: I have personally reviewed following labs and imaging studies  CBC: Recent Labs  Lab 10/18/20 0336 10/18/20 0904 10/19/20 0030 10/20/20 0802 10/21/20 0056 10/22/20 0131  WBC 8.4  --  6.6 4.6 6.7 6.1  HGB 7.8* 8.4* 8.0* 8.2* 10.3* 7.4*  HCT 26.2* 27.5* 25.9* 27.3* 32.9* 24.2*  MCV 74.6*  --  72.1* 74.2* 71.8* 72.2*  PLT 79*  --  68* 69* 63* 72*   Basic Metabolic Panel: Recent Labs  Lab 10/17/20 1446 10/18/20 0336 10/20/20 0802 10/21/20 0056 10/21/20 1412 10/22/20 0131  NA 139 134* 136 138  --  137  K 3.8 4.2 3.9 5.3*  --  4.2  CL 105 104 106 106  --  104  CO2 26 23 24 23   --  27  GLUCOSE 91 88 90 101*  --  103*  BUN 31* 30* 26* 22  --  25*  CREATININE 1.13 1.08 1.01 1.14  --  1.25*  CALCIUM 8.7* 8.0* 8.7* 9.3  --  8.7*  MG 1.9  --   --   --  1.9  --    GFR: Estimated Creatinine Clearance: 55.2 mL/min (A) (by C-G formula based on SCr of 1.25 mg/dL (H)). Liver Function Tests: Recent Labs   Lab 10/20/20 0802 10/21/20 0056 10/22/20 0131  AST 16 50* 12*  ALT 23 18 18   ALKPHOS 58 71 57  BILITOT 1.0 1.2 0.7  PROT 6.4* 7.8 5.8*  ALBUMIN 2.3* 3.0* 2.3*   No results for input(s): LIPASE, AMYLASE in the last 168 hours. No results for input(s): AMMONIA in the last 168 hours. Coagulation Profile: No results for input(s): INR, PROTIME in the last 168 hours. Cardiac Enzymes: No results for input(s): CKTOTAL, CKMB, CKMBINDEX, TROPONINI in the last 168 hours. BNP (last 3 results) No results for input(s): PROBNP in the last 8760 hours. HbA1C: No results for input(s): HGBA1C in the last 72 hours. CBG: No results for  input(s): GLUCAP in the last 168 hours. Lipid Profile: No results for input(s): CHOL, HDL, LDLCALC, TRIG, CHOLHDL, LDLDIRECT in the last 72 hours. Thyroid Function Tests: No results for input(s): TSH, T4TOTAL, FREET4, T3FREE, THYROIDAB in the last 72 hours. Anemia Panel: No results for input(s): VITAMINB12, FOLATE, FERRITIN, TIBC, IRON, RETICCTPCT in the last 72 hours. Sepsis Labs: No results for input(s): PROCALCITON, LATICACIDVEN in the last 168 hours.  Recent Results (from the past 240 hour(s))  SARS CORONAVIRUS 2 (TAT 6-24 HRS) Nasopharyngeal Nasopharyngeal Swab     Status: None   Collection Time: 10/17/20  8:02 PM   Specimen: Nasopharyngeal Swab  Result Value Ref Range Status   SARS Coronavirus 2 NEGATIVE NEGATIVE Final    Comment: (NOTE) SARS-CoV-2 target nucleic acids are NOT DETECTED.  The SARS-CoV-2 RNA is generally detectable in upper and lower respiratory specimens during the acute phase of infection. Negative results do not preclude SARS-CoV-2 infection, do not rule out co-infections with other pathogens, and should not be used as the sole basis for treatment or other patient management decisions. Negative results must be combined with clinical observations, patient history, and epidemiological information. The expected result is  Negative.  Fact Sheet for Patients: SugarRoll.be  Fact Sheet for Healthcare Providers: https://www.woods-mathews.com/  This test is not yet approved or cleared by the [place] FDA and  has been authorized for detection and/or diagnosis of SARS-CoV-2 by FDA under an Emergency Use Authorization (EUA). This EUA will remain  in effect (meaning this test can be used) for the duration of the COVID-19 declaration under Se ction 564(b)(1) of the Act, 21 U.S.C. section 360bbb-3(b)(1), unless the authorization is terminated or revoked sooner.  Performed at [facility], [place] [address] [place], [place] [address]   MRSA PCR Screening     Status: None   Collection Time: 10/18/20  2:15 PM   Specimen: Nasopharyngeal  Result Value Ref Range Status   MRSA by PCR NEGATIVE NEGATIVE Final    Comment:        The GeneXpert MRSA Assay (FDA approved for NASAL specimens only), is one component of a comprehensive MRSA colonization surveillance program. It is not intended to diagnose MRSA infection nor to guide or monitor treatment for MRSA infections. Performed at [facility], [place] [address] [place], [place] [address]          Radiology Studies: No results found.      Scheduled Meds: . sodium chloride   Intravenous Once  . digoxin  0.125 mg Oral Daily  . diltiazem  120 mg Oral QHS  . diltiazem  60 mg Oral Q12H  . metoprolol succinate  50 mg Oral Daily  . nicotine  14 mg Transdermal Daily  . umeclidinium bromide  1 puff Inhalation Daily   Continuous Infusions:    LOS: 5 days   Phillips Climes, MD  10/22/2020, 12:17 PM

## 2020-10-22 NOTE — Consult Note (Addendum)
[place]  [phone] [phone]   MEDICAL ONCOLOGY - INITIAL CONSULTATION  Referral MD: Dr. 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 fib, basal cell carcinoma, tobacco dependence.  The patient was sent to the emergency room from the echo lab when he had a heart rate in the 140s.  The patient was asymptomatic at the time of transfer.  He was found to be in atrial flutter.  He was seen by cardiology and started on digoxin and Cardizem.  Plan was for TEE and cardioversion but over the weekend, he developed heme positive stools.  GI was consulted and he underwent EGD and colonoscopy on 10/21/2020.  EGD was normal.  Colonoscopy showed a partially obstructing mass in the sigmoid colon which measured 5 to 7 cm in length.  Biopsy taken and consistent with adenocarcinoma.  CT chest/abdomen/pelvis have been ordered and are currently pending.  Labs from today show a hemoglobin of 7.4, MCV 72.2, and platelets 72,000.  Iron studies from 10/18/2020 showed a ferritin of 35, iron 16, percent saturation 6%.  He received a dose of Feraheme 510 mg IV on 10/19/2020.  1 unit PRBCs ordered today.  The patient has developed thrombocytopenia this admission. He has been receiving IV heparin which has now been stopped.  HIT panel has been sent and is currently pending.  The patient was seen in his hospital room.  No family at the bedside.  He is currently drinking oral contrast for his CT scan to be completed later today.  He states that he does not want to drink the contrast because he is too full from eating lunch earlier today.  He is also anxious to be discharged because he wants to go home and smoke his pipe.  He reports that prior to admission, he was  feeling well and had no complaints whatsoever.  Upon further questioning, he does report a weight loss of approximately 10 to 15 pounds recently.  He is not aware of any loss of appetite, fevers, chills, headaches, dizziness, chest pain, abdominal pain, nausea, vomiting, constipation, diarrhea.  He reports that he has his baseline shortness of breath secondary to COPD.  He denied having any melena or hematochezia.  Has never had a colonoscopy.  The patient currently lives with his son and his wife as well as his granddaughter.  He reports that he does not drink any alcohol.  He smokes a pipe 5-6 times per day.  Denies family history of cancer.  Medical oncology was asked see the patient make recommendations regarding his sigmoid colon adenocarcinoma.   Past Medical History:  Diagnosis Date  . Anemia   . Atrial fibrillation ([place])    post operative in 2018  . Atrial flutter ([place])   . Bacteremia 2018  . Basal cell carcinoma   . COPD (chronic obstructive pulmonary disease) ([place])   . Dementia ([place])   . Diabetes ([place])   . DVT of deep femoral vein ([place])   . Hypertension   . Thrombocytopenia ([place])   :  Past Surgical History:  Procedure Laterality Date  . MITRAL VALVE REPLACEMENT  07/2016   [facility] for endocarditis  :  Current Facility-Administered Medications  Medication Dose Route Frequency Provider Last Rate Last Admin  . 0.9 %  sodium chloride infusion (Manually program  via Guardrails IV Fluids)   Intravenous Once Elgergawy, Silver Huguenin, MD      . alum & mag hydroxide-simeth (MAALOX/MYLANTA) 200-200-20 MG/5ML suspension 30 mL  30 mL Oral Q4H PRN Georgette Shell, MD   30 mL at 10/22/20 [id number]  . digoxin (LANOXIN) tablet 0.125 mg  0.125 mg Oral Daily [name], MD   0.125 mg at 10/22/20 0909  . diltiazem (CARDIZEM CD) 24 hr capsule 120 mg  120 mg Oral QHS Buford Dresser, MD      . diltiazem (CARDIZEM SR) 12 hr capsule 60 mg  60 mg Oral Q12H Buford Dresser, MD    60 mg at 10/21/20 1102  . feeding supplement (ENSURE ENLIVE / ENSURE PLUS) liquid 237 mL  237 mL Oral BID BM Elgergawy, Silver Huguenin, MD      . levalbuterol (XOPENEX) nebulizer solution 0.63 mg  0.63 mg Inhalation Q6H PRN [name], MD      . metoprolol succinate (TOPROL-XL) 24 hr tablet 50 mg  50 mg Oral Daily Buford Dresser, MD   50 mg at 10/22/20 0857  . nicotine (NICODERM CQ - dosed in mg/24 hours) patch 14 mg  14 mg Transdermal Daily [name], MD   14 mg at 10/22/20 0907  . umeclidinium bromide (INCRUSE ELLIPTA) 62.5 MCG/INH 1 puff  1 puff Inhalation Daily [name], MD   1 puff at 10/22/20 0733     Allergies  Allergen Reactions  . Orange Juice [Orange Oil] Hives  :  Family History  Problem Relation Age of Onset  . Hypertension Other   :  Social History   Socioeconomic History  . Marital status: Single    Spouse name: Not on file  . Number of children: Not on file  . Years of education: Not on file  . Highest education level: Not on file  Occupational History  . Not on file  Tobacco Use  . Smoking status: Current Every Day Smoker    Years: 62.00    Types: Cigarettes, Pipe  . Smokeless tobacco: Never Used  Vaping Use  . Vaping Use: Never used  Substance and Sexual Activity  . Alcohol use: Not Currently    Comment: occasionally drinks beer 09/03/20  . Drug use: Not Currently    Comment: previous marijuana use  . Sexual activity: Not Currently  Other Topics Concern  . Not on file  Social History Narrative   moved from [place] to [place] in 2019 to live with his son    Social Determinants of Radio broadcast assistant Strain: Not on [employer] Insecurity: Not on file  Transportation Needs: Unmet Transportation Needs  . Lack of Transportation (Medical): Yes  . Lack of Transportation (Non-Medical): Yes  Physical Activity: Not on file  Stress: Not on file  Social Connections: Not on file  Intimate Partner Violence: Not on  file  :  Review of Systems: A comprehensive 14 point review of systems was negative except as noted in the HPI.  Exam: Patient Vitals for the past 24 hrs:  BP Temp Temp src Pulse Resp SpO2  10/22/20 1146 96/68 98.7 F (37.1 C) Oral 99 -- 95 %  10/22/20 1145 -- -- -- (!) 104 -- --  10/22/20 0910 (!) 93/57 -- -- -- -- --  10/22/20 0900 (!) 84/50 -- -- 91 16 97 %  10/22/20 0734 -- -- -- -- -- 100 %  10/22/20 0731 106/62 98.3 F (  36.8 C) Oral (!) 113 -- 96 %  10/22/20 0352 92/63 98.7 F (37.1 C) Oral 100 16 99 %  10/22/20 0002 (!) 96/51 98.5 F (36.9 C) Oral 82 17 98 %  10/21/20 2139 (!) 94/51 -- -- 66 -- 96 %  10/21/20 1954 (!) 103/56 98.7 F (37.1 C) Oral 65 14 96 %  10/21/20 1702 (!) 99/55 -- -- 87 -- --  10/21/20 1523 -- -- -- (P) 70 -- --    General: Chronically ill-appearing male, no distress Eyes:  no scleral icterus.   ENT: Poor dentition, no thrush or mucositis.    Lymphatics:  Negative cervical, supraclavicular or axillary adenopathy.   Respiratory: Diminished breath sounds. Cardiovascular: Irregular, tachycardic, no lower extremity edema. GI:  abdomen was soft, flat, nontender, nondistended, without organomegaly.   Musculoskeletal: Strength symmetrical in the upper and lower extremities. Skin: No rashes, he has a few scattered ecchymoses Neuro exam was nonfocal. Patient was alert and oriented.    Lab Results  Component Value Date   WBC 6.1 10/22/2020   HGB 7.4 (L) 10/22/2020   HCT 24.2 (L) 10/22/2020   PLT 72 (L) 10/22/2020   GLUCOSE 103 (H) 10/22/2020   CHOL 153 01/29/2020   TRIG 82 01/29/2020   HDL 60 01/29/2020   LDLCALC 77 01/29/2020   ALT 18 10/22/2020   AST 12 (L) 10/22/2020   NA 137 10/22/2020   K 4.2 10/22/2020   CL 104 10/22/2020   CREATININE 1.25 (H) 10/22/2020   BUN 25 (H) 10/22/2020   CO2 27 10/22/2020    DG Chest 2 View  Result Date: 10/17/2020 CLINICAL DATA:  Chest pain, shortness of breath, extreme fatigue EXAM: CHEST - 2 VIEW  COMPARISON:  None. FINDINGS: Cardiomediastinal silhouette and pulmonary vasculature are within normal limits. Median sternotomy changes are seen. The third wire is fractured. Prosthetic valve is seen. Lungs are hyperexpanded, but otherwise clear clear. IMPRESSION: No acute cardiopulmonary process. Electronically Signed   By: Miachel Roux M.D.   On: 10/17/2020 15:46   ECHOCARDIOGRAM COMPLETE  Result Date: 10/19/2020    ECHOCARDIOGRAM REPORT   Patient Name:   [name] Date of Exam: 10/19/2020 Medical Rec #:  [id number]        Height:       72.0 in Accession #:    [id number]       Weight:       179.0 lb Date of Birth:  1945/07/05        BSA:          2.032 m Patient Age:    89 years         BP:           93/61 mmHg Patient Gender: M                HR:           133 bpm. Exam Location:  Inpatient Procedure: 2D Echo, 3D Echo, Cardiac Doppler and Color Doppler Indications:    Abnormal EKG  History:        Patient has prior history of Echocardiogram examinations, most                 recent 07/24/2016. COPD, Signs/Symptoms:Shortness of Breath; Risk                 Factors:Current Smoker and Hypertension. History of                 endocarditis.  Mitral Valve: 31 mm Medtronic Mosaic bioprosthetic valve valve                 is present in the mitral position. Procedure Date: 07/14/2016.  Sonographer:    Darlina Sicilian RDCS Referring Phys: [phone] BYRON [place]  Sonographer Comments: Global longitudinal strain was attempted. IMPRESSIONS  1. Left ventricular ejection fraction, by estimation, is 65 to 70%. The left ventricle has normal function. The left ventricle has no regional wall motion abnormalities. Left ventricular diastolic function could not be evaluated.  2. Right ventricular systolic function is normal. The right ventricular size is mildly enlarged. There is normal pulmonary artery systolic pressure. The estimated right ventricular systolic pressure is 70.4 mmHg.  3. 31 mm mosaic prosthetic  valve with MG 7 mmHG @ 130 bpm. EOA 2.32 cm2. DI 1.6. Normal prosthesis with higher gradients due to tachycardia. The mitral valve has been repaired/replaced. No evidence of mitral valve regurgitation. There is a 31 mm Medtronic Mosaic bioprosthetic valve present in the mitral position. Procedure Date: 07/14/2016.  4. The aortic valve is tricuspid. Aortic valve regurgitation is not visualized. No aortic stenosis is present.  5. The inferior vena cava is normal in size with greater than 50% respiratory variability, suggesting right atrial pressure of 3 mmHg. FINDINGS  Left Ventricle: Left ventricular ejection fraction, by estimation, is 65 to 70%. The left ventricle has normal function. The left ventricle has no regional wall motion abnormalities. The left ventricular internal cavity size was normal in size. There is  no left ventricular hypertrophy. Left ventricular diastolic function could not be evaluated due to mitral valve replacement. Left ventricular diastolic function could not be evaluated. Right Ventricle: The right ventricular size is mildly enlarged. No increase in right ventricular wall thickness. Right ventricular systolic function is normal. There is normal pulmonary artery systolic pressure. The tricuspid regurgitant velocity is 2.46  m/s, and with an assumed right atrial pressure of 3 mmHg, the estimated right ventricular systolic pressure is 88.8 mmHg. Left Atrium: Left atrial size was normal in size. Right Atrium: Right atrial size was normal in size. Pericardium: Trivial pericardial effusion is present. Mitral Valve: 31 mm mosaic prosthetic valve with MG 7 mmHG @ 130 bpm. EOA 2.32 cm2. DI 1.6. Normal prosthesis with higher gradients due to tachycardia. The mitral valve has been repaired/replaced. No evidence of mitral valve regurgitation. There is a 31 mm Medtronic Mosaic bioprosthetic valve present in the mitral position. Procedure Date: 07/14/2016. MV peak gradient, 12.2 mmHg. The mean mitral  valve gradient is 7.0 mmHg. Tricuspid Valve: The tricuspid valve is grossly normal. Tricuspid valve regurgitation is mild . No evidence of tricuspid stenosis. Aortic Valve: The aortic valve is tricuspid. Aortic valve regurgitation is not visualized. No aortic stenosis is present. Pulmonic Valve: The pulmonic valve was grossly normal. Pulmonic valve regurgitation is not visualized. No evidence of pulmonic stenosis. Aorta: The aortic root and ascending aorta are structurally normal, with no evidence of dilitation. Venous: The inferior vena cava is normal in size with greater than 50% respiratory variability, suggesting right atrial pressure of 3 mmHg. IAS/Shunts: The atrial septum is grossly normal.  LEFT VENTRICLE PLAX 2D LVIDd:         3.50 cm LVIDs:         2.40 cm LV PW:         0.90 cm LV IVS:        0.90 cm LVOT diam:     2.20 cm LV SV:  49 LV SV Index:   24 LVOT Area:     3.80 cm  RIGHT VENTRICLE RV S prime:     9.03 cm/s TAPSE (M-mode): 1.3 cm LEFT ATRIUM             Index       RIGHT ATRIUM           Index LA diam:        2.80 cm 1.38 cm/m  RA Area:     16.00 cm LA Vol (A2C):   16.8 ml 8.27 ml/m  RA Volume:   44.60 ml  21.94 ml/m LA Vol (A4C):   32.1 ml 15.79 ml/m LA Biplane Vol: 25.2 ml 12.40 ml/m  AORTIC VALVE LVOT Vmax:   98.40 cm/s LVOT Vmean:  67.900 cm/s LVOT VTI:    0.128 m  AORTA Ao Root diam: 3.70 cm Ao Asc diam:  3.30 cm MITRAL VALVE             TRICUSPID VALVE MV Area VTI:  2.32 cm   TR Peak grad:   24.2 mmHg MV Peak grad: 12.2 mmHg  TR Vmax:        246.00 cm/s MV Mean grad: 7.0 mmHg MV Vmax:      1.75 m/s   SHUNTS MV Vmean:     122.0 cm/s Systemic VTI:  0.13 m MV VTI:       0.21 m     Systemic Diam: 2.20 cm Eleonore Chiquito MD Electronically signed by Eleonore Chiquito MD Signature Date/Time: 10/19/2020/1:35:36 PM    Final      DG Chest 2 View  Result Date: 10/17/2020 CLINICAL DATA:  Chest pain, shortness of breath, extreme fatigue EXAM: CHEST - 2 VIEW COMPARISON:  None. FINDINGS:  Cardiomediastinal silhouette and pulmonary vasculature are within normal limits. Median sternotomy changes are seen. The third wire is fractured. Prosthetic valve is seen. Lungs are hyperexpanded, but otherwise clear clear. IMPRESSION: No acute cardiopulmonary process. Electronically Signed   By: Miachel Roux M.D.   On: 10/17/2020 15:46   ECHOCARDIOGRAM COMPLETE  Result Date: 10/19/2020    ECHOCARDIOGRAM REPORT   Patient Name:   [name] Date of Exam: 10/19/2020 Medical Rec #:  [id number]        Height:       72.0 in Accession #:    [id number]       Weight:       179.0 lb Date of Birth:  [birth year]        BSA:          2.032 m Patient Age:    74 years         BP:           93/61 mmHg Patient Gender: M                HR:           133 bpm. Exam Location:  Inpatient Procedure: 2D Echo, 3D Echo, Cardiac Doppler and Color Doppler Indications:    Abnormal EKG  History:        Patient has prior history of Echocardiogram examinations, most                 recent 07/24/2016. COPD, Signs/Symptoms:Shortness of Breath; Risk                 Factors:Current Smoker and Hypertension. History of  endocarditis.                  Mitral Valve: 31 mm Medtronic Mosaic bioprosthetic valve valve                 is present in the mitral position. Procedure Date: 07/14/2016.  Sonographer:    Darlina Sicilian RDCS Referring Phys: [phone] BYRON [place]  Sonographer Comments: Global longitudinal strain was attempted. IMPRESSIONS  1. Left ventricular ejection fraction, by estimation, is 65 to 70%. The left ventricle has normal function. The left ventricle has no regional wall motion abnormalities. Left ventricular diastolic function could not be evaluated.  2. Right ventricular systolic function is normal. The right ventricular size is mildly enlarged. There is normal pulmonary artery systolic pressure. The estimated right ventricular systolic pressure is 48.5 mmHg.  3. 31 mm mosaic prosthetic valve with MG 7 mmHG @ 130  bpm. EOA 2.32 cm2. DI 1.6. Normal prosthesis with higher gradients due to tachycardia. The mitral valve has been repaired/replaced. No evidence of mitral valve regurgitation. There is a 31 mm Medtronic Mosaic bioprosthetic valve present in the mitral position. Procedure Date: 07/14/2016.  4. The aortic valve is tricuspid. Aortic valve regurgitation is not visualized. No aortic stenosis is present.  5. The inferior vena cava is normal in size with greater than 50% respiratory variability, suggesting right atrial pressure of 3 mmHg. FINDINGS  Left Ventricle: Left ventricular ejection fraction, by estimation, is 65 to 70%. The left ventricle has normal function. The left ventricle has no regional wall motion abnormalities. The left ventricular internal cavity size was normal in size. There is  no left ventricular hypertrophy. Left ventricular diastolic function could not be evaluated due to mitral valve replacement. Left ventricular diastolic function could not be evaluated. Right Ventricle: The right ventricular size is mildly enlarged. No increase in right ventricular wall thickness. Right ventricular systolic function is normal. There is normal pulmonary artery systolic pressure. The tricuspid regurgitant velocity is 2.46  m/s, and with an assumed right atrial pressure of 3 mmHg, the estimated right ventricular systolic pressure is 46.2 mmHg. Left Atrium: Left atrial size was normal in size. Right Atrium: Right atrial size was normal in size. Pericardium: Trivial pericardial effusion is present. Mitral Valve: 31 mm mosaic prosthetic valve with MG 7 mmHG @ 130 bpm. EOA 2.32 cm2. DI 1.6. Normal prosthesis with higher gradients due to tachycardia. The mitral valve has been repaired/replaced. No evidence of mitral valve regurgitation. There is a 31 mm Medtronic Mosaic bioprosthetic valve present in the mitral position. Procedure Date: 07/14/2016. MV peak gradient, 12.2 mmHg. The mean mitral valve gradient is 7.0 mmHg.  Tricuspid Valve: The tricuspid valve is grossly normal. Tricuspid valve regurgitation is mild . No evidence of tricuspid stenosis. Aortic Valve: The aortic valve is tricuspid. Aortic valve regurgitation is not visualized. No aortic stenosis is present. Pulmonic Valve: The pulmonic valve was grossly normal. Pulmonic valve regurgitation is not visualized. No evidence of pulmonic stenosis. Aorta: The aortic root and ascending aorta are structurally normal, with no evidence of dilitation. Venous: The inferior vena cava is normal in size with greater than 50% respiratory variability, suggesting right atrial pressure of 3 mmHg. IAS/Shunts: The atrial septum is grossly normal.  LEFT VENTRICLE PLAX 2D LVIDd:         3.50 cm LVIDs:         2.40 cm LV PW:         0.90 cm LV IVS:  0.90 cm LVOT diam:     2.20 cm LV SV:         49 LV SV Index:   24 LVOT Area:     3.80 cm  RIGHT VENTRICLE RV S prime:     9.03 cm/s TAPSE (M-mode): 1.3 cm LEFT ATRIUM             Index       RIGHT ATRIUM           Index LA diam:        2.80 cm 1.38 cm/m  RA Area:     16.00 cm LA Vol (A2C):   16.8 ml 8.27 ml/m  RA Volume:   44.60 ml  21.94 ml/m LA Vol (A4C):   32.1 ml 15.79 ml/m LA Biplane Vol: 25.2 ml 12.40 ml/m  AORTIC VALVE LVOT Vmax:   98.40 cm/s LVOT Vmean:  67.900 cm/s LVOT VTI:    0.128 m  AORTA Ao Root diam: 3.70 cm Ao Asc diam:  3.30 cm MITRAL VALVE             TRICUSPID VALVE MV Area VTI:  2.32 cm   TR Peak grad:   24.2 mmHg MV Peak grad: 12.2 mmHg  TR Vmax:        246.00 cm/s MV Mean grad: 7.0 mmHg MV Vmax:      1.75 m/s   SHUNTS MV Vmean:     122.0 cm/s Systemic VTI:  0.13 m MV VTI:       0.21 m     Systemic Diam: 2.20 cm Eleonore Chiquito MD Electronically signed by Eleonore Chiquito MD Signature Date/Time: 10/19/2020/1:35:36 PM    Final     Pathology:  SURGICAL PATHOLOGY  CASE: [id number]  PATIENT: [name]  Surgical Pathology Report   Clinical History: anemia, R/O malignancy (cm)   FINAL MICROSCOPIC DIAGNOSIS:    A. COLON, SIGMOID MASS, BIOPSY:  - Adenocarcinoma.  - See comment.   COMMENT:  Dr. Vic Ripper agrees.   Assessment and Plan:  1.  Sigmoid colon adenocarcinoma -Colonoscopy 10/21/2020- partially obstructing mass found in the sigmoid colon consistent with adenocarcinoma. 2.  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 3.  Thrombocytopenia 4.  Atrial flutter 5.  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  COPD 7.  Hypertension 8.  Diabetes mellitus 9.  Dementia 10.  Tobacco dependence  [name] was initially admitted secondary to atrial flutter with RVR.  During his hospitalization, he was noted to have heme positive stools and underwent GI work-up including upper endoscopy and colonoscopy.  He was found to have a partially obstructing mass in the sigmoid colon consistent with adenocarcinoma.  Initial staging CT scans and CEA are currently pending.  He has been seen by general surgery who is awaiting CT scan results for surgical planning.  The patient has iron deficiency anemia due to his underlying GI malignancy.  He has received a dose of Feraheme 510 mg IV.  He is due for a unit of blood today.  We will monitor hemoglobin closely.  He was noted to have worsening thrombocytopenia during this admission.  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 He was also noted to have platelet clumping.    Recommendations: 1.  Await CEA and CT scan results. 2.  Await recommendations from general surgery. 3.  Treatment recommendations to be discussed once we have further information from general surgery  and his CT scan results. 4.  Monitor hemoglobin closely and transfuse for hemoglobin less than 8.  Oral iron therapy at discharge 5.  We will arrange for outpatient follow-up at the cancer center further discussion of treatment options. 6.  Anticoagulation per cardiology after the primary tumor is  resected 7.   Check peripheral blood smear for evidence of platelet clumping  Thank you for this referral.   Mikey Bussing, DNP, AGPCNP-BC, AOCNP  [name] was interviewed and examined.  He has iron deficiency anemia and has been diagnosed with adenocarcinoma of the sigmoid colon.  Staging CT scans are pending today.  He can proceed to resection of the sigmoid primary if the CTs showed no evidence of metastatic disease.  We will arrange for outpatient follow-up to discuss adjuvant treatment options.  He had mild thrombocytopenia in August 2021.  The platelet count is moderately decreased and stable during this admission.  Platelet clumping was noted on a CBC 10/17/2020.  I have a low clinical suspicion for HIT.  He had thrombocytopenia at [facility] in [facility] 2018.  I will check on [name] over the next few days and outpatient follow-up will be arranged at the Cancer center.  I was present for greater than 50% of today's visit.  I performed medical decision making.

## 2020-10-22 NOTE — Consult Note (Signed)
ALDRIC WENZLER 12-Jun-1945  [id number].    Requesting MD: Dr. Phillips Climes Chief Complaint/Reason for Consult: sigmoid colon adenocarcinoma  HPI:  This is a 75 yo white male with a history of COPD, a fib, basal cell carcinoma currently undergoing radiation therapy, DM, HTN, endocarditis s/p MVR in 2019 in [place], who has been going to the wound clinic for management of his left forearm BCCs.  He is not a great historian and states he was referred to the hospital from the wound clinic for an EKG and then he could go home, but got admitted.  He has no idea why.  He denies CP, SOB, etc.  He was noted to have a HR in the 140s and was admitted.  He was subsequently found to have thrombocytopenia as well as anemia.  He was found to be hemoccult + from his stools.  He denies any blood in his stools at home and that his stools have been normal.  He denies any abdominal pain, N/V, issues eating, history of or family history of colon cancer, or every having had a colonoscopy before yesterday.  He then underwent this and was noted to have a partially obstructing sigmoid colon mass.  This was biopsied and noted to be adenocarcinoma.  He currently has a CEA as well as CT CAP pending.  He has been difficult to rate control from a cardiac standpoint during his stay due to hypotension issues, etc, however his ECHO shows a preserved EF with normal valves.  We have been asked to see him for further surgical recommendations.  ROS: ROS: Please see HPI, otherwise all other systems have been reviewed and are negative.  Family History  Problem Relation Age of Onset  . Hypertension Other     Past Medical History:  Diagnosis Date  . Anemia   . Atrial fibrillation ([place])    post operative in 2018  . Atrial flutter ([place])   . Bacteremia 2018  . Basal cell carcinoma   . COPD (chronic obstructive pulmonary disease) ([place])   . Dementia ([place])   . Diabetes ([place])   . DVT of deep femoral vein ([place])   .  Hypertension   . Thrombocytopenia ([place])     Past Surgical History:  Procedure Laterality Date  . MITRAL VALVE REPLACEMENT  07/2016   [facility] for endocarditis    Social History:  reports that he has been smoking cigarettes and pipe. He has smoked for the past 62.00 years. He has never used smokeless tobacco. He reports previous alcohol use. He reports previous drug use.  Allergies:  Allergies  Allergen Reactions  . Orange Juice [Orange Oil] Hives    Medications Prior to Admission  Medication Sig Dispense Refill  . aspirin EC 81 MG tablet Take 81 mg by mouth daily. Swallow whole.    . furosemide (LASIX) 40 MG tablet Take 40 mg by mouth daily.    . Multiple Vitamin (MULTI-VITAMIN DAILY) TABS Take 1 tablet by mouth daily.       Physical Exam: Blood pressure 96/68, pulse 99, temperature 98.7 F (37.1 C), temperature source Oral, resp. rate 16, height 6' (1.829 m), weight 81.2 kg, SpO2 95 %. General: skinny, disheveled, chronically ill-appearing white male who is laying in bed in NAD HEENT: head is normocephalic, atraumatic.  Sclera are noninjected.  PERRL.  Ears and nose without any masses or lesions.  Mouth is pink and moist Heart: regular rhythm, but tachy in low 100s.  Normal s1,s2.  No obvious murmurs, gallops, or rubs noted.  Palpable radial and pedal pulses bilaterally Lungs: CTAB, no wheezes, rhonchi, or rales noted.  Respiratory effort nonlabored Abd: soft, NT, ND, +BS, no masses or organomegaly.  There may be a small umbilical hernia present. MS: all 4 extremities are symmetrical with no cyanosis, clubbing, or edema. Skin: warm and dry with no masses, lesions, or rashes, except LUE with bandage in place over 2 BCC wounds.  See pictures in media. Neuro: Cranial nerves 2-12 grossly intact, sensation is normal throughout Psych: A&Ox3 with an appropriate affect.   Results for orders placed or performed during the hospital encounter of 10/17/20 (from the past 48 hour(s))   Heparin level (unfractionated)     Status: Abnormal   Collection Time: 10/20/20  6:12 PM  Result Value Ref Range   Heparin Unfractionated 0.27 (L) 0.30 - 0.70 IU/mL    Comment: (NOTE) The clinical reportable range upper limit is being lowered to >1.10 to align with the FDA approved guidance for the current laboratory assay.  If heparin results are below expected values, and patient dosage has  been confirmed, suggest follow up testing of antithrombin III levels. Performed at [facility], [place] [address], [place], [place] [address]   Heparin level (unfractionated)     Status: None   Collection Time: 10/21/20 12:56 AM  Result Value Ref Range   Heparin Unfractionated 0.40 0.30 - 0.70 IU/mL    Comment: (NOTE) The clinical reportable range upper limit is being lowered to >1.10 to align with the FDA approved guidance for the current laboratory assay.  If heparin results are below expected values, and patient dosage has  been confirmed, suggest follow up testing of antithrombin III levels. Performed at [facility], [place] [address] [place], [place] [address]   Comprehensive metabolic panel     Status: Abnormal   Collection Time: 10/21/20 12:56 AM  Result Value Ref Range   Sodium 138 135 - 145 mmol/L   Potassium 5.3 (H) 3.5 - 5.1 mmol/L    Comment: DELTA CHECK NOTED SLIGHT HEMOLYSIS    Chloride 106 98 - 111 mmol/L   CO2 23 22 - 32 mmol/L   Glucose, Bld 101 (H) 70 - 99 mg/dL    Comment: Glucose reference range applies only to samples taken after fasting for at least 8 hours.   BUN 22 8 - 23 mg/dL   Creatinine, Ser 1.14 0.61 - 1.24 mg/dL   Calcium 9.3 8.9 - 10.3 mg/dL   Total Protein 7.8 6.5 - 8.1 g/dL   Albumin 3.0 (L) 3.5 - 5.0 g/dL   AST 50 (H) 15 - 41 U/L   ALT 18 0 - 44 U/L   Alkaline Phosphatase 71 38 - 126 U/L   Total Bilirubin 1.2 0.3 - 1.2 mg/dL   GFR, Estimated >60 >60 mL/min    Comment: (NOTE) Calculated using the CKD-EPI Creatinine Equation (2021)     Anion gap 9 5 - 15    Comment: Performed at [place] [address] [place], [place] [address]  CBC     Status: Abnormal   Collection Time: 10/21/20 12:56 AM  Result Value Ref Range   WBC 6.7 4.0 - 10.5 K/uL   RBC 4.58 4.22 - 5.81 MIL/uL   Hemoglobin 10.3 (L) 13.0 - 17.0 g/dL    Comment: REPEATED TO VERIFY   HCT 32.9 (L) 39.0 - 52.0 %   MCV 71.8 (L) 80.0 - 100.0 fL  MCH 22.5 (L) 26.0 - 34.0 pg   MCHC 31.3 30.0 - 36.0 g/dL   RDW 18.3 (H) 11.5 - 15.5 %   Platelets 63 (L) 150 - 400 K/uL    Comment: Immature Platelet Fraction may be clinically indicated, consider ordering this additional test [id number] CONSISTENT WITH PREVIOUS RESULT    nRBC 0.0 0.0 - 0.2 %    Comment: Performed at [facility], [place] [address], [place], [place] [address]  Surgical pathology     Status: None   Collection Time: 10/21/20  8:12 AM  Result Value Ref Range   SURGICAL PATHOLOGY      SURGICAL PATHOLOGY CASE: [phone] PATIENT: Marissa Calamity Surgical Pathology Report     Clinical History: anemia, R/O malignancy (cm)     FINAL MICROSCOPIC DIAGNOSIS:  A. COLON, SIGMOID MASS, BIOPSY: - Adenocarcinoma. - See comment.  COMMENT: Dr. Vic Ripper agrees.  Called to Dr. Corena Pilgrim office on 10/22/2020.   GROSS DESCRIPTION: Received in formalin is a 2 x 1.8 x 0.3 cm aggregate of tan-pink to pink-red soft to indurated tissue fragments with intermixed dark red soft material.  Entirely submitted 1 block. SW 10/21/2020   Final Diagnosis performed by Claudette Laws, MD.   Electronically signed 10/22/2020 Technical component performed at [facility]. [facility], [place] [address] [place], [place] [address].  Professional component performed at [facility], [place] [address] [place], [place] [address].  Immunohistochemistry Technical component (if applicable) was performed at [facility]. [address],  [place], [place], [place] [address].    IMMUNOHISTOCHEMISTRY DISCLAIMER (if applicable): Some of these immunohistochemical stains may have been developed and the performance characteristics determine by [facility]. Some may not have been cleared or approved by the U.S. Food and Drug Administration. The FDA has determined that such clearance or approval is not necessary. This test is used for clinical purposes. It should not be regarded as investigational or for research. This laboratory is certified under the [place] (CLIA-88) as qualified to perform high complexity clinical laboratory testing.  The controls stained appropriately.   Magnesium     Status: None   Collection Time: 10/21/20  2:12 PM  Result Value Ref Range   Magnesium 1.9 1.7 - 2.4 mg/dL    Comment: Performed at [facility], [place] [address] [place], [place] [address]  Comprehensive metabolic panel     Status: Abnormal   Collection Time: 10/22/20  1:31 AM  Result Value Ref Range   Sodium 137 135 - 145 mmol/L   Potassium 4.2 3.5 - 5.1 mmol/L    Comment: DELTA CHECK NOTED   Chloride 104 98 - 111 mmol/L   CO2 27 22 - 32 mmol/L   Glucose, Bld 103 (H) 70 - 99 mg/dL    Comment: Glucose reference range applies only to samples taken after fasting for at least 8 hours.   BUN 25 (H) 8 - 23 mg/dL   Creatinine, Ser 1.25 (H) 0.61 - 1.24 mg/dL   Calcium 8.7 (L) 8.9 - 10.3 mg/dL   Total Protein 5.8 (L) 6.5 - 8.1 g/dL   Albumin 2.3 (L) 3.5 - 5.0 g/dL   AST 12 (L) 15 - 41 U/L   ALT 18 0 - 44 U/L   Alkaline Phosphatase 57 38 - 126 U/L   Total Bilirubin 0.7 0.3 - 1.2 mg/dL   GFR, Estimated 60 (L) >60 mL/min    Comment: (NOTE) Calculated using the  CKD-EPI Creatinine Equation (2021)    Anion gap 6 5 - 15    Comment: Performed at [facility], [place] [address], [place], [place] [address]  CBC     Status: Abnormal   Collection Time: 10/22/20  1:31 AM  Result Value Ref Range   WBC 6.1 4.0 - 10.5 K/uL    RBC 3.35 (L) 4.22 - 5.81 MIL/uL   Hemoglobin 7.4 (L) 13.0 - 17.0 g/dL    Comment: REPEATED TO VERIFY Reticulocyte Hemoglobin testing may be clinically indicated, consider ordering this additional test [id number]    HCT 24.2 (L) 39.0 - 52.0 %   MCV 72.2 (L) 80.0 - 100.0 fL   MCH 22.1 (L) 26.0 - 34.0 pg   MCHC 30.6 30.0 - 36.0 g/dL   RDW 17.7 (H) 11.5 - 15.5 %   Platelets 72 (L) 150 - 400 K/uL    Comment: Immature Platelet Fraction may be clinically indicated, consider ordering this additional test [id number] CONSISTENT WITH PREVIOUS RESULT    nRBC 0.0 0.0 - 0.2 %    Comment: Performed at [facility], [place] [address], [place], [place] [address]  Digoxin level     Status: Abnormal   Collection Time: 10/22/20  1:31 AM  Result Value Ref Range   Digoxin Level 0.6 (L) 0.8 - 2.0 ng/mL    Comment: Performed at [facility], [place] [address] [place], [place] [address]  Type and screen [place]     Status: None (Preliminary result)   Collection Time: 10/22/20 12:33 PM  Result Value Ref Range   ABO/RH(D) PENDING    Antibody Screen PENDING    Sample Expiration      10/25/2020,2359 Performed at [facility], [place] [address] [place], [place] [address]    No results found.    Assessment/Plan A fib/flutter - per cards H/O MVR - echo is stable  COPD/tobacco abuse - smokes pipe Basal cell carcinoma x2 of LUE - currently undergoing radiation therapy DM - per primary H/O HTN ABL anemia - secondary to sigmoid colon mass Thrombocytopenia - unclear etiology.  plts around 70K currently  Partially obstructing adenocarcinoma of sigmoid colon The patient has been found to have a partially obstructing mass in his sigmoid colon.  Biopsies reveal adenoca.  The will benefit from resection of this area.  I have reached out to cardiology who feel he is intermediate risk for OR, but has a stable appearing echo as well as structurally.  For now, we will await his CEA  level, CT CAP, prealbumin, and then make further plans/timing of OR as this information becomes available.  Continue on clear liquids at this time.  We will continue to follow with you.  FEN - CLD/IVFs VTE - on hold due to anemia ID - none currently needed  Henreitta Cea, [facility] 10/22/2020, 1:53 PM Please see Amion for pager number during day hours 7:00am-4:30pm or 7:00am -11:30am on weekends

## 2020-10-22 NOTE — Progress Notes (Signed)
Progress Note  Patient Name: [name] Date of Encounter: 10/22/2020  [facility] Cardiologist: Donato Heinz, MD   Subjective   No acute events overnight. Rates well controlled after metoprolol added. No acute concerns today other than he is having trouble drinking the oral contrast for his CT. Biopsy from colonoscopy shows adenocarcinoma.   Inpatient Medications    Scheduled Meds: . sodium chloride   Intravenous Once  . digoxin  0.125 mg Oral Daily  . diltiazem  120 mg Oral QHS  . diltiazem  60 mg Oral Q12H  . feeding supplement  237 mL Oral BID BM  . metoprolol succinate  50 mg Oral Daily  . nicotine  14 mg Transdermal Daily  . umeclidinium bromide  1 puff Inhalation Daily  unclear error, but not on diltiazem 60 mg q12 hours any longer.  Continuous Infusions:  PRN Meds: alum & mag hydroxide-simeth, levalbuterol   Vital Signs    Vitals:   10/22/20 0900 10/22/20 0910 10/22/20 1145 10/22/20 1146  BP: (!) 84/50 (!) 93/57  96/68  Pulse: 91  (!) 104 99  Resp: 16     Temp:    98.7 F (37.1 C)  TempSrc:    Oral  SpO2: 97%   95%  Weight:      Height:       No intake or output data in the 24 hours ending 10/22/20 1403 Last 3 Weights 10/18/2020 09/03/2020 01/29/2020  Weight (lbs) 179 lb 165 lb 6 oz 176 lb  Weight (kg) 81.194 kg 75.014 kg 79.833 kg      Telemetry    2:1 atrial flutter transitioning to 4:1 with rate control - Personally Reviewed  ECG    Atrial flutter 10/17/20 - Personally Reviewed  Physical Exam   GEN: Well nourished, well developed in no acute distress NECK: No JVD CARDIAC: regular rhythm, normal S1 and S2, no rubs or gallops. No murmur. VASCULAR: Radial pulses 2+ bilaterally.  RESPIRATORY:  Clear to auscultation without rales, wheezing or rhonchi  ABDOMEN: Soft, non-tender, non-distended MUSCULOSKELETAL:  Moves all 4 limbs independently SKIN: Warm and dry, no edema. Diffuse ecchymoses NEUROLOGIC:  No focal neuro deficits  noted. PSYCHIATRIC:  Normal affect   Labs    High Sensitivity Troponin:   Recent Labs  Lab 10/17/20 1446 10/17/20 1646  TROPONINIHS 5 6      Chemistry Recent Labs  Lab 10/20/20 0802 10/21/20 0056 10/22/20 0131  NA 136 138 137  K 3.9 5.3* 4.2  CL 106 106 104  CO2 24 23 27   GLUCOSE 90 101* 103*  BUN 26* 22 25*  CREATININE 1.01 1.14 1.25*  CALCIUM 8.7* 9.3 8.7*  PROT 6.4* 7.8 5.8*  ALBUMIN 2.3* 3.0* 2.3*  AST 16 50* 12*  ALT 23 18 18   ALKPHOS 58 71 57  BILITOT 1.0 1.2 0.7  GFRNONAA >60 >60 60*  ANIONGAP 6 9 6      Hematology Recent Labs  Lab 10/20/20 0802 10/21/20 0056 10/22/20 0131  WBC 4.6 6.7 6.1  RBC 3.68* 4.58 3.35*  HGB 8.2* 10.3* 7.4*  HCT 27.3* 32.9* 24.2*  MCV 74.2* 71.8* 72.2*  MCH 22.3* 22.5* 22.1*  MCHC 30.0 31.3 30.6  RDW 17.5* 18.3* 17.7*  PLT 69* 63* 72*    BNPNo results for input(s): BNP, PROBNP in the last 168 hours.   DDimer No results for input(s): DDIMER in the last 168 hours.   Radiology    No results found.  Cardiac Studies  Echo 10/19/20 1. Left ventricular ejection fraction, by estimation, is 65 to 70%. The  left ventricle has normal function. The left ventricle has no regional  wall motion abnormalities. Left ventricular diastolic function could not  be evaluated.  2. Right ventricular systolic function is normal. The right ventricular  size is mildly enlarged. There is normal pulmonary artery systolic  pressure. The estimated right ventricular systolic pressure is 20.2 mmHg.  3. 31 mm mosaic prosthetic valve with MG 7 mmHG @ 130 bpm. EOA 2.32 cm2.  DI 1.6. Normal prosthesis with higher gradients due to tachycardia. The  mitral valve has been repaired/replaced. No evidence of mitral valve  regurgitation. There is a 31 mm  Medtronic Mosaic bioprosthetic valve present in the mitral position.  Procedure Date: 07/14/2016.  4. The aortic valve is tricuspid. Aortic valve regurgitation is not  visualized. No aortic  stenosis is present.  5. The inferior vena cava is normal in size with greater than 50%  respiratory variability, suggesting right atrial pressure of 3 mmHg  Patient Profile     75 y.o. 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  Assessment & Plan    Atrial flutter: Please see discussion from yesterday. Very difficult situation. No anticoagulation, so cannot pursue rhythm control. Aimed for rate control, was not controlled on digoxin and diltiazem, did get rate control with addition of metoprolol.  Will consolidate metoprolol to once daily AM dosing. He will get his AM diltiazem, then will consolidate to PM daily diltiazem dosing tonight (so will get metoprolol in the AM and diltiazem in the PM going forward).   Asked to comment on preoperative cardiovascular risk. Overall, his EF is normal and valves are functioning well. His atrial fibrillation rate is improved, though given worsening anemia it is expected that his rates may increase. He does not have history of known CAD and denies symptoms. Overall he is likely intermediate risk from a cardiac perspective, and given his comorbidities further cardiac testing would not change management.   We will follow his rates peripherally, please call with any questions.  For questions or updates, please contact [place] Please consult [email or web address] for contact info under        Signed, Buford Dresser, MD  10/22/2020, 2:03 PM

## 2020-10-23 DIAGNOSIS — D696 Thrombocytopenia, unspecified: Secondary | ICD-10-CM | POA: Diagnosis not present

## 2020-10-23 DIAGNOSIS — E43 Unspecified severe protein-calorie malnutrition: Secondary | ICD-10-CM | POA: Insufficient documentation

## 2020-10-23 DIAGNOSIS — D630 Anemia in neoplastic disease: Secondary | ICD-10-CM | POA: Diagnosis not present

## 2020-10-23 DIAGNOSIS — I484 Atypical atrial flutter: Secondary | ICD-10-CM | POA: Diagnosis not present

## 2020-10-23 DIAGNOSIS — C187 Malignant neoplasm of sigmoid colon: Secondary | ICD-10-CM | POA: Diagnosis not present

## 2020-10-23 DIAGNOSIS — K6389 Other specified diseases of intestine: Secondary | ICD-10-CM | POA: Diagnosis not present

## 2020-10-23 LAB — CBC
HCT: 26.1 % — ABNORMAL LOW (ref 39.0–52.0)
Hemoglobin: 8.2 g/dL — ABNORMAL LOW (ref 13.0–17.0)
MCH: 23.2 pg — ABNORMAL LOW (ref 26.0–34.0)
MCHC: 31.4 g/dL (ref 30.0–36.0)
MCV: 73.7 fL — ABNORMAL LOW (ref 80.0–100.0)
Platelets: 64 10*3/uL — ABNORMAL LOW (ref 150–400)
RBC: 3.54 MIL/uL — ABNORMAL LOW (ref 4.22–5.81)
RDW: 18.6 % — ABNORMAL HIGH (ref 11.5–15.5)
WBC: 10.2 10*3/uL (ref 4.0–10.5)
nRBC: 0.2 % (ref 0.0–0.2)

## 2020-10-23 LAB — COMPREHENSIVE METABOLIC PANEL
ALT: 19 U/L (ref 0–44)
AST: 15 U/L (ref 15–41)
Albumin: 2.3 g/dL — ABNORMAL LOW (ref 3.5–5.0)
Alkaline Phosphatase: 63 U/L (ref 38–126)
Anion gap: 7 (ref 5–15)
BUN: 20 mg/dL (ref 8–23)
CO2: 24 mmol/L (ref 22–32)
Calcium: 8.6 mg/dL — ABNORMAL LOW (ref 8.9–10.3)
Chloride: 103 mmol/L (ref 98–111)
Creatinine, Ser: 1.12 mg/dL (ref 0.61–1.24)
GFR, Estimated: >60 mL/min (ref >60)
Glucose, Bld: 108 mg/dL — ABNORMAL HIGH (ref 70–99)
Potassium: 3.9 mmol/L (ref 3.5–5.1)
Sodium: 134 mmol/L — ABNORMAL LOW (ref 135–145)
Total Bilirubin: 1.2 mg/dL (ref 0.3–1.2)
Total Protein: 6 g/dL — ABNORMAL LOW (ref 6.5–8.1)

## 2020-10-23 LAB — VITAMIN B12: Vitamin B-12: 360 pg/mL (ref 180–914)

## 2020-10-23 LAB — CEA: CEA: 3.9 ng/mL (ref 0.0–4.7)

## 2020-10-23 LAB — GLUCOSE, CAPILLARY
Glucose-Capillary: 158 mg/dL — ABNORMAL HIGH (ref 70–99)
Glucose-Capillary: 130 mg/dL — ABNORMAL HIGH (ref 70–99)

## 2020-10-23 LAB — FOLATE: Folate: 28.1 ng/mL (ref >5.9)

## 2020-10-23 MED ORDER — POLYSACCHARIDE IRON COMPLEX 150 MG PO CAPS
150.0000 mg | ORAL_CAPSULE | Freq: Every day | ORAL | Status: DC
  Administered 2020-10-23 – 2020-11-01 (×9): 150 mg via ORAL
  Filled 2020-10-23 (×9): qty 1

## 2020-10-23 MED ORDER — ENOXAPARIN SODIUM 40 MG/0.4ML IJ SOSY: 40.0000 mg | PREFILLED_SYRINGE | Freq: Once | INTRAMUSCULAR | Status: DC

## 2020-10-23 MED ORDER — ACETAMINOPHEN 500 MG PO TABS
1000.0000 mg | ORAL_TABLET | ORAL | Status: AC
  Administered 2020-10-24: 1000 mg via ORAL
  Filled 2020-10-23: qty 2

## 2020-10-23 MED ORDER — SODIUM CHLORIDE 0.9 % IV SOLN: INTRAVENOUS | Status: DC

## 2020-10-23 MED ORDER — SODIUM CHLORIDE 0.9 % IV SOLN
2.0000 g | INTRAVENOUS | Status: AC
  Administered 2020-10-24: 2 g via INTRAVENOUS
  Filled 2020-10-23 (×3): qty 2

## 2020-10-23 NOTE — Progress Notes (Signed)
PROGRESS NOTE    [name]  YTK:[id number] DOB: 12-10-1945 DOA: 10/17/2020 PCP: Pcp, No   Brief Narrative:  75 years old male with PMH significant for bacterial endocarditis s/p mitral valve replacement in 2018, DVT, DM, HTN, dementia, tobacco use, COPD, atrial fibrillation, sarcoidosis, basal cell carcinoma who was transferred from echo lab to the ER with heart rate in 140s.  Patient denies any symptoms.  Patient states that he is unaware that his heart rate is elevated.  EKG shows atrial flutter with RVR.  Patient is also hypotensive.  He has not followed up with his regular doctor in several years.  Patient was seen by cardiologist and was started on digoxin and Cardizem p.o.  Apparently Cardizem IV drip dropped his blood pressure drastically.  Plan was to do TEE and DC cardioversion however over the weekend patient had guaiac positive stools and GI was consulted and patient had EGD and colonoscopy done on 10/21/2020 which showed a possible partially obstructing mass in the sigmoid colon.  Allergy confirms adenocarcinoma.  Assessment & Plan:   Active Problems:   Hx of bacterial endocarditis   H/O mitral valve replacement   Basal cell carcinoma (BCC) of skin of left wrist   Atrial flutter (HCC)   Microcytic anemia   COPD (chronic obstructive pulmonary disease) (HCC)   Atrial fibrillation with RVR (HCC)   Occult blood in stools   Iron deficiency anemia due to chronic blood loss   Protein-calorie malnutrition, severe   Obstructing colonic mass adenocarcinoma -Back significant for adenocarcinoma, colonoscopy 5/23 significant for near obstructing mass --oncology consulted, with Dr. Benay Spice --Marvis Repress consulted Plan: --sigmoid colectomy with colostomy to be scheduled by GenSurg  A. fib/a flutter with RVR -Heart rate difficult to control in the setting of soft blood pressure  -Due to lower GI bleed is not a candidate for cardioversion. -Management per cardiology, he is on p.o.  Cardizem, amiodarone and digoxin. -TSH wnl Plan: --cont digoxin 0.125 mg daily --cont cardizem 120 mg daily --cont Toprol 50 mg daily  History of mitral valve replacement and infective endocarditis  - His ejection fraction is 65 to 70%.  Normal left ventricular function and no regional wall motion abnormalities.  Chronic blood loss anemia Severe iron deficiency anemia  -This is secondary to partially obstructing sigmoid adenocarcinoma . -Received IV iron .  Anemia panel consistent with severe iron deficiency anemia -s/p 1u pRBC for Hemoglobin is 7.4  - Appreciate GI input colonoscopy showed likely malignant partially obstructing tumor in the sigmoid colon which was biopsied. - EGD was essentially normal. Plan: --Monitor Hgb and transfuse to keep >7 --start iron supplement  Current smoker -cessation advised.  He is not ready to give up smoking yet. --cont nicotine patch  basal cell carcinoma  --left hand covered with dressing.  Acute on chronic thrombocytopenia -with iron deficiency and guaiac positive stools with nonobstructing sigmoid mass with pending biopsy.  Patient has chronic thrombocytopenia which is worsened since going on anticoagulation.   --HIT panel neg Plan: --monitor plt  Hyperkalemia, resolved --s/p a dose of Lokelma    Estimated body mass index is 20.54 kg/m as calculated from the following:   Height as of this encounter: 6' (1.829 m).   Weight as of this encounter: 68.7 kg.  DVT prophylaxis: None due to GI bleed, SCD Code Status: Full code Family Communication: updated son at bedside today disposition Plan:  Status is: Inpatient  Dispo: The patient is from: Home  Anticipated d/c is to: Home              Patient currently is not medically stable to d/c.  Need surgery for obstructing mass.    Difficult to place patient No    Consultants:   Cardiology   GI  General surgery  Procedures: EGD/colonoscopy 5/23  Antimicrobials:  None  Subjective:  Pt reported feeling much better with 1u pRBC yesterday.  No further GI bleeding seen.  Pt was anxious about his upcoming surgery.   Objective: Vitals:   10/23/20 0500 10/23/20 0800 10/23/20 0822 10/23/20 0833  BP: (!) 101/59 (!) 102/57    Pulse:  98 (!) 115 100  Resp:  14 18   Temp:  97.7 F (36.5 C)    TempSrc:  Oral    SpO2:  98% 98%   Weight:      Height:        Intake/Output Summary (Last 24 hours) at 10/23/2020 1507 Last data filed at 10/23/2020 0344 Gross per 24 hour  Intake 375.33 ml  Output 401 ml  Net -25.67 ml   Filed Weights   10/18/20 0451 10/22/20 1550  Weight: 81.2 kg 68.7 kg    Examination:  Constitutional: NAD, AAOx3 HEENT: conjunctivae and lids normal, EOMI CV: No cyanosis.   RESP: normal respiratory effort, on RA Extremities: No effusions, edema in BLE SKIN: warm, dry Neuro: II - XII grossly intact.   Psych: anxious mood and affect.  Appropriate judgement and reason   Data Reviewed: I have personally reviewed following labs and imaging studies  CBC: Recent Labs  Lab 10/19/20 0030 10/20/20 0802 10/21/20 0056 10/22/20 0131 10/23/20 0048  WBC 6.6 4.6 6.7 6.1 10.2  HGB 8.0* 8.2* 10.3* 7.4* 8.2*  HCT 25.9* 27.3* 32.9* 24.2* 26.1*  MCV 72.1* 74.2* 71.8* 72.2* 73.7*  PLT 68* 69* 63* 72* 64*   Basic Metabolic Panel: Recent Labs  Lab 10/17/20 1446 10/18/20 0336 10/20/20 0802 10/21/20 0056 10/21/20 1412 10/22/20 0131 10/23/20 0048  NA 139 134* 136 138  --  137 134*  K 3.8 4.2 3.9 5.3*  --  4.2 3.9  CL 105 104 106 106  --  104 103  CO2 26 23 24 23   --  27 24  GLUCOSE 91 88 90 101*  --  103* 108*  BUN 31* 30* 26* 22  --  25* 20  CREATININE 1.13 1.08 1.01 1.14  --  1.25* 1.12  CALCIUM 8.7* 8.0* 8.7* 9.3  --  8.7* 8.6*  MG 1.9  --   --   --  1.9  --   --    GFR: Estimated Creatinine Clearance: 54.5 mL/min (by C-G formula based on SCr of 1.12 mg/dL). Liver Function Tests: Recent Labs  Lab 10/20/20 0802  10/21/20 0056 10/22/20 0131 10/23/20 0048  AST 16 50* 12* 15  ALT 23 18 18 19   ALKPHOS 58 71 57 63  BILITOT 1.0 1.2 0.7 1.2  PROT 6.4* 7.8 5.8* 6.0*  ALBUMIN 2.3* 3.0* 2.3* 2.3*   No results for input(s): LIPASE, AMYLASE in the last 168 hours. No results for input(s): AMMONIA in the last 168 hours. Coagulation Profile: No results for input(s): INR, PROTIME in the last 168 hours. Cardiac Enzymes: No results for input(s): CKTOTAL, CKMB, CKMBINDEX, TROPONINI in the last 168 hours. BNP (last 3 results) No results for input(s): PROBNP in the last 8760 hours. HbA1C: No results for input(s): HGBA1C in the last 72 hours. CBG: No results for input(s):  GLUCAP in the last 168 hours. Lipid Profile: No results for input(s): CHOL, HDL, LDLCALC, TRIG, CHOLHDL, LDLDIRECT in the last 72 hours. Thyroid Function Tests: Recent Labs    10/22/20 1233  TSH 1.445   Anemia Panel: No results for input(s): VITAMINB12, FOLATE, FERRITIN, TIBC, IRON, RETICCTPCT in the last 72 hours. Sepsis Labs: No results for input(s): PROCALCITON, LATICACIDVEN in the last 168 hours.  Recent Results (from the past 240 hour(s))  SARS CORONAVIRUS 2 (TAT 6-24 HRS) Nasopharyngeal Nasopharyngeal Swab     Status: None   Collection Time: 10/17/20  8:02 PM   Specimen: Nasopharyngeal Swab  Result Value Ref Range Status   SARS Coronavirus 2 NEGATIVE NEGATIVE Final    Comment: (NOTE) SARS-CoV-2 target nucleic acids are NOT DETECTED.  The SARS-CoV-2 RNA is generally detectable in upper and lower respiratory specimens during the acute phase of infection. Negative results do not preclude SARS-CoV-2 infection, do not rule out co-infections with other pathogens, and should not be used as the sole basis for treatment or other patient management decisions. Negative results must be combined with clinical observations, patient history, and epidemiological information. The expected result is Negative.  Fact Sheet for  Patients: SugarRoll.be  Fact Sheet for Healthcare Providers: https://www.woods-mathews.com/  This test is not yet approved or cleared by the [place] FDA and  has been authorized for detection and/or diagnosis of SARS-CoV-2 by FDA under an Emergency Use Authorization (EUA). This EUA will remain  in effect (meaning this test can be used) for the duration of the COVID-19 declaration under Se ction 564(b)(1) of the Act, 21 U.S.C. section 360bbb-3(b)(1), unless the authorization is terminated or revoked sooner.  Performed at [facility], [place] [address], [place], [place] [address]   MRSA PCR Screening     Status: None   Collection Time: 10/18/20  2:15 PM   Specimen: Nasopharyngeal  Result Value Ref Range Status   MRSA by PCR NEGATIVE NEGATIVE Final    Comment:        The GeneXpert MRSA Assay (FDA approved for NASAL specimens only), is one component of a comprehensive MRSA colonization surveillance program. It is not intended to diagnose MRSA infection nor to guide or monitor treatment for MRSA infections. Performed at [facility], [place] [address], [place], [place] [address]          Radiology Studies: CT CHEST ABDOMEN PELVIS W CONTRAST  Result Date: 10/22/2020 CLINICAL DATA:  75 year old male with history of colorectal cancer. Staging examination. EXAM: CT CHEST, ABDOMEN, AND PELVIS WITH CONTRAST TECHNIQUE: Multidetector CT imaging of the chest, abdomen and pelvis was performed following the standard protocol during bolus administration of intravenous contrast. CONTRAST:  136mL OMNIPAQUE IOHEXOL 300 MG/ML  SOLN COMPARISON:  No priors. FINDINGS: CT CHEST FINDINGS Cardiovascular: Heart size is normal. There is no significant pericardial fluid, thickening or pericardial calcification. Aortic atherosclerosis. No definite coronary artery calcifications. Status post median sternotomy for mitral valve replacement.  Mediastinum/Nodes: No pathologically enlarged mediastinal or hilar lymph nodes. Esophagus is unremarkable in appearance. No axillary lymphadenopathy. Lungs/Pleura: Rounded area of airspace consolidation in the posterior aspect of the left lower lobe (axial image 125 of series 5), likely a focus of infection. Small pulmonary nodules are noted measuring 6 mm in the inferior aspect of the lingula (axial image 101 of series 5), 5 mm in the periphery of the left lower lobe (axial image 72 of series 5), and 5 mm in the posterior aspect of the right lower lobe (  axial image 94 of series 5). Areas of linear scarring or atelectasis are noted in the upper lobes of the lungs bilaterally. Mild paraseptal emphysema. Musculoskeletal: Median sternotomy wires. There are no aggressive appearing lytic or blastic lesions noted in the visualized portions of the skeleton. CT ABDOMEN PELVIS FINDINGS Hepatobiliary: Multiple subcentimeter low-attenuation lesions scattered throughout the hepatic parenchyma, too small to characterize, but statistically likely to represent cysts. The larger of the low-attenuation hepatic lesions are compatible with simple cysts, measuring up to 11 mm in the periphery of segment 4A. No other suspicious hepatic lesions. No intra or extrahepatic biliary ductal dilatation. Gallbladder is normal in appearance. Pancreas: No pancreatic mass. No pancreatic ductal dilatation. No pancreatic or peripancreatic fluid collections or inflammatory changes. Spleen: Unremarkable. Adrenals/Urinary Tract: 1.5 cm low-attenuation lesion in the posterior aspect of the interpolar region of the left kidney, compatible with a simple cyst. Right kidney and bilateral adrenal glands are normal in appearance. No hydroureteronephrosis. Urinary bladder is normal in appearance. Stomach/Bowel: The appearance of the stomach is normal. There is no pathologic dilatation of small bowel or colon. Mass-like area of mural thickening in the sigmoid  colon estimated to measure approximately 10.4 x 6.3 x 6.3 cm (axial image 108 of series 3 and coronal image 55 of series 6). Focal direct extension of malignant appearing soft tissue posteriorly from the lesion (axial image 106 of series 3) into the sigmoid mesocolon. The appendix is not confidently identified and may be surgically absent. Regardless, there are no inflammatory changes noted adjacent to the cecum to suggest the presence of an acute appendicitis at this time. Vascular/Lymphatic: Aortic atherosclerosis, without evidence of aneurysm or dissection in the abdominal or pelvic vasculature. Enlarged right external iliac lymph node measuring 1.5 cm in short axis (axial image 106 of series 3). Several other prominent but nonenlarged lymph nodes are noted in the pelvis, largest of which measures up to 6 mm in the low left anterior pelvis (axial image 113 of series 3). Enlarged right common iliac lymph node (axial image 97 of series 3) measuring 1.6 cm in short axis. Reproductive: Prostate gland and seminal vesicles are unremarkable in appearance. Other: No significant volume of ascites.  No pneumoperitoneum. Musculoskeletal: There are no aggressive appearing lytic or blastic lesions noted in the visualized portions of the skeleton. IMPRESSION: 1. Large colonic mass estimated to measure approximately 10.4 x 6.3 x 6.3 cm in the sigmoid colon with pelvic lymphadenopathy indicative of metastatic disease, as above. Small pulmonary nodules are also noted, which could conceivably represent metastatic lesions as well. Attention on follow-up imaging is recommended to assess for the stability of these nodules. 2. Airspace consolidation in the left lower lobe concerning for potential pneumonia. 3. Aortic atherosclerosis. 4. Mild paraseptal emphysema. 5. Additional incidental findings, as above. Electronically Signed   By: Vinnie Langton M.D.   On: 10/22/2020 18:54        Scheduled Meds: . (feeding supplement)  PROSource Plus  30 mL Oral BID BM  . sodium chloride   Intravenous Once  . digoxin  0.125 mg Oral Daily  . diltiazem  120 mg Oral QHS  . feeding supplement  1 Container Oral TID BM  . metoprolol succinate  50 mg Oral Daily  . nicotine  14 mg Transdermal Daily  . umeclidinium bromide  1 puff Inhalation Daily   Continuous Infusions: . sodium chloride       LOS: 6 days   Enzo Bi, MD  10/23/2020, 3:07 PM

## 2020-10-23 NOTE — Progress Notes (Signed)
Received pt from ED [facility] 15 appears in no apparent distress, denies abdominal pain, atrial flutter hr 109

## 2020-10-23 NOTE — Progress Notes (Signed)
IP PROGRESS NOTE  Subjective:   Mr. Cowans appears unchanged.  No new complaint.  Objective: Vital signs in last 24 hours: Blood pressure 108/65, pulse (!) 109, temperature 98.3 F (36.8 C), temperature source Oral, resp. rate 16, height 6' (1.829 m), weight 151 lb 7.3 oz (68.7 kg), SpO2 98 %.  Intake/Output from previous day: 05/24 0701 - 05/25 0700 In: 375.3 [Blood:375.3] Out: 40 [Urine:400; Stool:1]  Physical Exam: Not performed today   Lab Results: Recent Labs    10/22/20 0131 10/23/20 0048  WBC 6.1 10.2  HGB 7.4* 8.2*  HCT 24.2* 26.1*  PLT 72* 64*   Blood smear 10/22/2020: The white cell morphology is unremarkable, numerous ovalocytes, cigar cells, acanthocytes, and teardrops, the polychromasia is not increased.  The platelets appear mildly decreased in number.  No platelet clumps. BMET Recent Labs    10/22/20 0131 10/23/20 0048  NA 137 134*  K 4.2 3.9  CL 104 103  CO2 27 24  GLUCOSE 103* 108*  BUN 25* 20  CREATININE 1.25* 1.12  CALCIUM 8.7* 8.6*    Lab Results  Component Value Date   CEA1 3.9 10/22/2020    Studies/Results: CT CHEST ABDOMEN PELVIS W CONTRAST  Result Date: 10/22/2020 CLINICAL DATA:  75 year old male with history of colorectal cancer. Staging examination. EXAM: CT CHEST, ABDOMEN, AND PELVIS WITH CONTRAST TECHNIQUE: Multidetector CT imaging of the chest, abdomen and pelvis was performed following the standard protocol during bolus administration of intravenous contrast. CONTRAST:  112mL OMNIPAQUE IOHEXOL 300 MG/ML  SOLN COMPARISON:  No priors. FINDINGS: CT CHEST FINDINGS Cardiovascular: Heart size is normal. There is no significant pericardial fluid, thickening or pericardial calcification. Aortic atherosclerosis. No definite coronary artery calcifications. Status post median sternotomy for mitral valve replacement. Mediastinum/Nodes: No pathologically enlarged mediastinal or hilar lymph nodes. Esophagus is unremarkable in appearance. No  axillary lymphadenopathy. Lungs/Pleura: Rounded area of airspace consolidation in the posterior aspect of the left lower lobe (axial image 125 of series 5), likely a focus of infection. Small pulmonary nodules are noted measuring 6 mm in the inferior aspect of the lingula (axial image 101 of series 5), 5 mm in the periphery of the left lower lobe (axial image 72 of series 5), and 5 mm in the posterior aspect of the right lower lobe (axial image 94 of series 5). Areas of linear scarring or atelectasis are noted in the upper lobes of the lungs bilaterally. Mild paraseptal emphysema. Musculoskeletal: Median sternotomy wires. There are no aggressive appearing lytic or blastic lesions noted in the visualized portions of the skeleton. CT ABDOMEN PELVIS FINDINGS Hepatobiliary: Multiple subcentimeter low-attenuation lesions scattered throughout the hepatic parenchyma, too small to characterize, but statistically likely to represent cysts. The larger of the low-attenuation hepatic lesions are compatible with simple cysts, measuring up to 11 mm in the periphery of segment 4A. No other suspicious hepatic lesions. No intra or extrahepatic biliary ductal dilatation. Gallbladder is normal in appearance. Pancreas: No pancreatic mass. No pancreatic ductal dilatation. No pancreatic or peripancreatic fluid collections or inflammatory changes. Spleen: Unremarkable. Adrenals/Urinary Tract: 1.5 cm low-attenuation lesion in the posterior aspect of the interpolar region of the left kidney, compatible with a simple cyst. Right kidney and bilateral adrenal glands are normal in appearance. No hydroureteronephrosis. Urinary bladder is normal in appearance. Stomach/Bowel: The appearance of the stomach is normal. There is no pathologic dilatation of small bowel or colon. Mass-like area of mural thickening in the sigmoid colon estimated to measure approximately 10.4 x 6.3 x 6.3 cm (  axial image 108 of series 3 and coronal image 55 of series 6).  Focal direct extension of malignant appearing soft tissue posteriorly from the lesion (axial image 106 of series 3) into the sigmoid mesocolon. The appendix is not confidently identified and may be surgically absent. Regardless, there are no inflammatory changes noted adjacent to the cecum to suggest the presence of an acute appendicitis at this time. Vascular/Lymphatic: Aortic atherosclerosis, without evidence of aneurysm or dissection in the abdominal or pelvic vasculature. Enlarged right external iliac lymph node measuring 1.5 cm in short axis (axial image 106 of series 3). Several other prominent but nonenlarged lymph nodes are noted in the pelvis, largest of which measures up to 6 mm in the low left anterior pelvis (axial image 113 of series 3). Enlarged right common iliac lymph node (axial image 97 of series 3) measuring 1.6 cm in short axis. Reproductive: Prostate gland and seminal vesicles are unremarkable in appearance. Other: No significant volume of ascites.  No pneumoperitoneum. Musculoskeletal: There are no aggressive appearing lytic or blastic lesions noted in the visualized portions of the skeleton. IMPRESSION: 1. Large colonic mass estimated to measure approximately 10.4 x 6.3 x 6.3 cm in the sigmoid colon with pelvic lymphadenopathy indicative of metastatic disease, as above. Small pulmonary nodules are also noted, which could conceivably represent metastatic lesions as well. Attention on follow-up imaging is recommended to assess for the stability of these nodules. 2. Airspace consolidation in the left lower lobe concerning for potential pneumonia. 3. Aortic atherosclerosis. 4. Mild paraseptal emphysema. 5. Additional incidental findings, as above. Electronically Signed   By: Vinnie Langton M.D.   On: 10/22/2020 18:54    Medications: I have reviewed the patient's current medications.  Assessment/Plan: 1.  Sigmoid colon adenocarcinoma -Colonoscopy 10/21/2020- partially obstructing mass found  in the sigmoid colon consistent with adenocarcinoma. -CTs 10/22/2020- sigmoid colon mass, small indeterminate pulmonary nodules, left lower lobe airspace consolidation, enlarged pelvic lymph nodes including right external iliac and right common iliac nodes 2.  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 3.  Thrombocytopenia 4.  Atrial fibrillation/flutter 5.  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  COPD 7.  Hypertension 8.  Diabetes mellitus 9.  Dementia 10.  Tobacco dependence  Mr Pereira been diagnosed with colon cancer.  He appears to have locally advanced disease.  There is no clear evidence of distant metastatic disease.  He presented with severe iron deficiency anemia.  I discussed the CT findings and treatment options with Mr. Cassarino.  I reviewed the CT images.  He has thrombocytopenia of unclear etiology.  The thrombocytopenia appears chronic.  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  I discussed the case with Dr. Donne Hazel.  He has reviewed the risk/benefit of surgery in detail with Mr. Southwood and his family.  He has significant risk of perioperative complications secondary to comorbid conditions.  The plan is to proceed with an attempt at resection of the sigmoid tumor.  Mr. Knoop has an arrhythmia and will benefit from anticoagulation therapy.  He is not a good candidate for anticoagulation therapy with the primary tumor in place.  I will plan to see Mr. Brabson after surgery.  We will plan additional outpatient staging and systemic therapy as indicated.   LOS: 6 days   Betsy Coder, MD   10/23/2020, 3:20 PM

## 2020-10-23 NOTE — Consult Note (Signed)
[facility] Nurse requested for preoperative stoma site marking  Discussed surgical procedure and stoma creation with patient and family.  Explained role of the [facility] nurse team.  Provided the patient with educational booklet and provided samples of pouching options.  Answered patient and family questions. Son at bedside.  Patient lives with him. He is agreeable to learn pouch care along with his dad.  He says afternoon sessions will be ideal as he drives a scooter. He cannot come early and cannot come on rainy days. Patient is now accepting that he needs the surgery and states he wants to live with a good quality of life.   Examined patient lying, sitting, and standing in order to place the marking in the patient's visual field, away from any creases or abdominal contour issues and within the rectus muscle.  Patient wears his pants low on his hips and has a low umbilicus.  To avoid issues with clothing, I am marking above the umbilicus.  Marked for colostomy in the LLQ  3 cm to the left of the umbilicus and 3 cm above the umbilicus.  Marked for ileostomy in the RLQ  3 cm to the right of the umbilicus and  3 cm above the umbilicus.  Patient's abdomen cleansed with CHG wipes at site markings, allowed to air dry prior to marking.Covered mark with thin film transparent dressing to preserve mark until date of surgery.   [facility] Nurse team will follow up with patient after surgery for continue ostomy care and teaching.  Written materials, folder and pouch samples left at bedside and patient is very interested in this.  We discuss pouching options and how every stoma looks different.  He states he should be able to cut and apply pouch. No visual or dexterity limitations noted.  Domenic Moras MSN, RN, FNP-BC [facility] Wound, Ostomy, Continence Nurse Pager [phone]

## 2020-10-23 NOTE — Progress Notes (Signed)
2 Days Post-Op  Subjective: Patient feels ok this morning.  Doesn't like his clear liquids.  Wants more to eat.    ROS: See above, otherwise other systems negative  Objective: Vital signs in last 24 hours: Temp:  [97.8 F (36.6 C)-100.2 F (37.9 C)] 97.8 F (36.6 C) (05/25 0344) Pulse Rate:  [74-122] 100 (05/25 0833) Resp:  [14-18] 18 (05/25 0822) BP: (84-102)/(50-68) 101/59 (05/25 0500) SpO2:  [95 %-100 %] 98 % (05/25 0822) Weight:  [68.7 kg] 68.7 kg (05/24 1550) Last BM Date: 10/22/20  Intake/Output from previous day: 05/24 0701 - 05/25 0700 In: 375.3 [Blood:375.3] Out: 401 [Urine:400; Stool:1] Intake/Output this shift: No intake/output data recorded.  PE: Gen: NAD Heart: tachy, low 100s Lungs: CTAB Abd: soft, firmness noted in LLQ, +BS, NT  Lab Results:  Recent Labs    10/22/20 0131 10/23/20 0048  WBC 6.1 10.2  HGB 7.4* 8.2*  HCT 24.2* 26.1*  PLT 72* 64*   BMET Recent Labs    10/22/20 0131 10/23/20 0048  NA 137 134*  K 4.2 3.9  CL 104 103  CO2 27 24  GLUCOSE 103* 108*  BUN 25* 20  CREATININE 1.25* 1.12  CALCIUM 8.7* 8.6*   PT/INR No results for input(s): LABPROT, INR in the last 72 hours. CMP     Component Value Date/Time   NA 134 (L) 10/23/2020 0048   NA 139 01/29/2020 1548   K 3.9 10/23/2020 0048   CL 103 10/23/2020 0048   CO2 24 10/23/2020 0048   GLUCOSE 108 (H) 10/23/2020 0048   BUN 20 10/23/2020 0048   BUN 34 (H) 01/29/2020 1548   CREATININE 1.12 10/23/2020 0048   CALCIUM 8.6 (L) 10/23/2020 0048   PROT 6.0 (L) 10/23/2020 0048   PROT 7.3 01/29/2020 1548   ALBUMIN 2.3 (L) 10/23/2020 0048   ALBUMIN 3.9 01/29/2020 1548   AST 15 10/23/2020 0048   ALT 19 10/23/2020 0048   ALKPHOS 63 10/23/2020 0048   BILITOT 1.2 10/23/2020 0048   BILITOT 0.4 01/29/2020 1548   GFRNONAA >60 10/23/2020 0048   GFRAA 79 01/29/2020 1548   Lipase  No results found for: LIPASE     Studies/Results: CT CHEST ABDOMEN PELVIS W CONTRAST  Result  Date: 10/22/2020 CLINICAL DATA:  75 year old male with history of colorectal cancer. Staging examination. EXAM: CT CHEST, ABDOMEN, AND PELVIS WITH CONTRAST TECHNIQUE: Multidetector CT imaging of the chest, abdomen and pelvis was performed following the standard protocol during bolus administration of intravenous contrast. CONTRAST:  16mL OMNIPAQUE IOHEXOL 300 MG/ML  SOLN COMPARISON:  No priors. FINDINGS: CT CHEST FINDINGS Cardiovascular: Heart size is normal. There is no significant pericardial fluid, thickening or pericardial calcification. Aortic atherosclerosis. No definite coronary artery calcifications. Status post median sternotomy for mitral valve replacement. Mediastinum/Nodes: No pathologically enlarged mediastinal or hilar lymph nodes. Esophagus is unremarkable in appearance. No axillary lymphadenopathy. Lungs/Pleura: Rounded area of airspace consolidation in the posterior aspect of the left lower lobe (axial image 125 of series 5), likely a focus of infection. Small pulmonary nodules are noted measuring 6 mm in the inferior aspect of the lingula (axial image 101 of series 5), 5 mm in the periphery of the left lower lobe (axial image 72 of series 5), and 5 mm in the posterior aspect of the right lower lobe (axial image 94 of series 5). Areas of linear scarring or atelectasis are noted in the upper lobes of the lungs bilaterally. Mild paraseptal emphysema. Musculoskeletal: Median sternotomy wires. There  are no aggressive appearing lytic or blastic lesions noted in the visualized portions of the skeleton. CT ABDOMEN PELVIS FINDINGS Hepatobiliary: Multiple subcentimeter low-attenuation lesions scattered throughout the hepatic parenchyma, too small to characterize, but statistically likely to represent cysts. The larger of the low-attenuation hepatic lesions are compatible with simple cysts, measuring up to 11 mm in the periphery of segment 4A. No other suspicious hepatic lesions. No intra or extrahepatic  biliary ductal dilatation. Gallbladder is normal in appearance. Pancreas: No pancreatic mass. No pancreatic ductal dilatation. No pancreatic or peripancreatic fluid collections or inflammatory changes. Spleen: Unremarkable. Adrenals/Urinary Tract: 1.5 cm low-attenuation lesion in the posterior aspect of the interpolar region of the left kidney, compatible with a simple cyst. Right kidney and bilateral adrenal glands are normal in appearance. No hydroureteronephrosis. Urinary bladder is normal in appearance. Stomach/Bowel: The appearance of the stomach is normal. There is no pathologic dilatation of small bowel or colon. Mass-like area of mural thickening in the sigmoid colon estimated to measure approximately 10.4 x 6.3 x 6.3 cm (axial image 108 of series 3 and coronal image 55 of series 6). Focal direct extension of malignant appearing soft tissue posteriorly from the lesion (axial image 106 of series 3) into the sigmoid mesocolon. The appendix is not confidently identified and may be surgically absent. Regardless, there are no inflammatory changes noted adjacent to the cecum to suggest the presence of an acute appendicitis at this time. Vascular/Lymphatic: Aortic atherosclerosis, without evidence of aneurysm or dissection in the abdominal or pelvic vasculature. Enlarged right external iliac lymph node measuring 1.5 cm in short axis (axial image 106 of series 3). Several other prominent but nonenlarged lymph nodes are noted in the pelvis, largest of which measures up to 6 mm in the low left anterior pelvis (axial image 113 of series 3). Enlarged right common iliac lymph node (axial image 97 of series 3) measuring 1.6 cm in short axis. Reproductive: Prostate gland and seminal vesicles are unremarkable in appearance. Other: No significant volume of ascites.  No pneumoperitoneum. Musculoskeletal: There are no aggressive appearing lytic or blastic lesions noted in the visualized portions of the skeleton. IMPRESSION: 1.  Large colonic mass estimated to measure approximately 10.4 x 6.3 x 6.3 cm in the sigmoid colon with pelvic lymphadenopathy indicative of metastatic disease, as above. Small pulmonary nodules are also noted, which could conceivably represent metastatic lesions as well. Attention on follow-up imaging is recommended to assess for the stability of these nodules. 2. Airspace consolidation in the left lower lobe concerning for potential pneumonia. 3. Aortic atherosclerosis. 4. Mild paraseptal emphysema. 5. Additional incidental findings, as above. Electronically Signed   By: Vinnie Langton M.D.   On: 10/22/2020 18:54    Anti-infectives: Anti-infectives (From admission, onward)   None       Assessment/Plan A fib/flutter - per cards H/O MVR - echo is stable  COPD/tobacco abuse - smokes pipe Basal cell carcinoma x2 of LUE - currently undergoing radiation therapy DM - per primary H/O HTN ABL anemia - secondary to sigmoid colon mass Thrombocytopenia - unclear etiology.  plts 54K Moderated PCM - prealbumin 9  Partially obstructing adenocarcinoma of sigmoid colon -long discussion with patient today regarding his CT scan findings as well as surgical recommendations. -discussed secondary to his smoking status, malnutrition level, etc that the safe option for him is laparotomy with partial colectomy and colostomy.  The patient was taken off guard by this.  I drew him a picture to explain what all this meant and  further explained why this was the case.  He stated he would really need to think about it with the hopes that during that time we would find some other option.  We once again discussed the two options are to do do the above stated surgery or to do no surgery but that would likely result in obstruction and ultimate perforation, etc.  This was actually discussed by myself as well as Dr. Donne Hazel when he came in too.  -patient will think about what he wants to do today and we will check back in to  see what he decides.  If he agrees to pursue surgery, we will have [place] mark him and ask urology to be available for stenting prior to resection.   -given his option is a colostomy, we can let him eat solid food instead of a CLD. -cards has cleared the patient with intermediate risk.  FEN - HH diet/IVFs VTE - on hold due to anemia ID - none currently needed   LOS: 6 days    [name] , [facility] Surgery 10/23/2020, 8:40 AM Please see Amion for pager number during day hours 7:00am-4:30pm or 7:00am -11:30am on weekends

## 2020-10-23 NOTE — Evaluation (Signed)
Physical Therapy Evaluation Patient Details Name: [name] MRN: [id number] DOB: 1945/08/20 Today's Date: 10/23/2020   History of Present Illness  75 y.o. male transferred from echo lab to ER on 5/19 when noted to have HR in 140s, with rhythm of atrial flutter and asymptomatic. Chest Abdomen [place] CT shows large colonic mass in the sigmoid colon with pelvic lymphadenopathy and small pulmonary nodules indicative of metastatic disease, potential pneumonia of L lower lobe, and mild paraseptal emphysema. 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  Clinical Impression  Pt presents to PT with deficits in activity tolerance, however these seem to be chronic in nature per patient report. Pt is able to ambulate household distances independently at this time. Pt expresses no current concerns about mobility. PT will keep pt on caseload to to reassess if surgery is to occur. Pt is encouraged to mobilize multiple times both in and out of the room daily.    Follow Up Recommendations No PT follow up    Equipment Recommendations  None recommended by PT    Recommendations for Other Services       Precautions / Restrictions Precautions Precautions: None Restrictions Weight Bearing Restrictions: No      Mobility  Bed Mobility Overal bed mobility: Independent                  Transfers Overall transfer level: Independent                  Ambulation/Gait Ambulation/Gait assistance: Independent Gait Distance (Feet): 150 Feet Assistive device: None Gait Pattern/deviations: Step-through pattern Gait velocity: functional Gait velocity interpretation: 1.31 - 2.62 ft/sec, indicative of limited community ambulator General Gait Details: pt with steady step-through gait  Stairs            Wheelchair Mobility    Modified  Rankin (Stroke Patients Only)       Balance                                             Pertinent Vitals/Pain      Home Living Family/patient expects to be discharged to:: Private residence Living Arrangements: Children Available Help at Discharge: Family;Available 24 hours/day Type of Home: Mobile home Home Access: Stairs to enter Entrance Stairs-Rails: Can reach both Entrance Stairs-Number of Steps: 3 Home Layout: One level Home Equipment: Cane - single point      Prior Function Level of Independence: Independent         Comments: limited to household and very short community distances     Journalist, newspaper        Extremity/Trunk Assessment   Upper Extremity Assessment Upper Extremity Assessment: Overall WFL for tasks assessed    Lower Extremity Assessment Lower Extremity Assessment: Overall WFL for tasks assessed    Cervical / Trunk Assessment Cervical / Trunk Assessment: Normal  Communication   Communication: No difficulties (loquacious)  Cognition Arousal/Alertness: Awake/alert Behavior During Therapy: WFL for tasks assessed/performed Overall Cognitive Status: Within Functional Limits for tasks assessed                                        General Comments General comments (skin integrity, edema, etc.):  VSS on RA    Exercises     Assessment/Plan    PT Assessment Patient needs continued PT services  PT Problem List Decreased activity tolerance       PT Treatment Interventions Gait training;Stair training;Balance training;Patient/family education    PT Goals (Current goals can be found in the Care Plan section)  Acute Rehab PT Goals Patient Stated Goal: to have surgery and go home PT Goal Formulation: With patient Time For Goal Achievement: 11/06/20 Potential to Achieve Goals: Good Additional Goals Additional Goal #1: Pt will score >19/24 on DGI to indicate a reduced risk for falls    Frequency Min  2X/week (check on after surgery)   Barriers to discharge        Co-evaluation               AM-PAC PT "6 Clicks" Mobility  Outcome Measure Help needed turning from your back to your side while in a flat bed without using bedrails?: None Help needed moving from lying on your back to sitting on the side of a flat bed without using bedrails?: None Help needed moving to and from a bed to a chair (including a wheelchair)?: None Help needed standing up from a chair using your arms (e.g., wheelchair or bedside chair)?: None Help needed to walk in hospital room?: None Help needed climbing 3-5 steps with a railing? : A Little 6 Click Score: 23    End of Session   Activity Tolerance: Patient tolerated treatment well Patient left: in bed;with call bell/phone within reach Nurse Communication: Mobility status PT Visit Diagnosis: Other abnormalities of gait and mobility (R26.89)    Time: 7262-0355 PT Time Calculation (min) (ACUTE ONLY): 19 min   Charges:   PT Evaluation $PT Eval Low Complexity: 1 Low          Zenaida Niece, PT, DPT Acute Rehabilitation Pager: [place] 10/23/2020, 2:07 PM

## 2020-10-23 NOTE — Progress Notes (Signed)
Patient ID: [name], male   DOB: Jan 26, 1946, 75 y.o.   MRN: [id number] Son present for conversation, daughter in law on phone. I went back and discussed surgery with him. I discussed sigmoid colectomy with colostomy again.  Hopefully can get clear radial margins on this. Will ask urology to place a stent.  This is high risk.  Risks include but not limited to bleeding, infection, reoperation, injury to ureter or other surrounding structures, dvt, pe stroke, mi and death.   Will ask [facility] to mark and urology for help as well

## 2020-10-24 ENCOUNTER — Inpatient Hospital Stay (HOSPITAL_COMMUNITY): Payer: Medicare Other | Admitting: Certified Registered Nurse Anesthetist

## 2020-10-24 ENCOUNTER — Encounter (HOSPITAL_COMMUNITY): Payer: Self-pay | Admitting: Internal Medicine

## 2020-10-24 ENCOUNTER — Encounter (HOSPITAL_COMMUNITY)
Admission: EM | Disposition: A | Discharge status: Home / Self Care | Payer: Self-pay | Source: Home / Self Care | Attending: Hospitalist

## 2020-10-24 ENCOUNTER — Telehealth: Payer: Medicare Other

## 2020-10-24 ENCOUNTER — Inpatient Hospital Stay (HOSPITAL_COMMUNITY): Payer: Medicare Other

## 2020-10-24 DIAGNOSIS — K6389 Other specified diseases of intestine: Secondary | ICD-10-CM | POA: Diagnosis not present

## 2020-10-24 DIAGNOSIS — I4891 Unspecified atrial fibrillation: Secondary | ICD-10-CM | POA: Diagnosis not present

## 2020-10-24 HISTORY — PX: CYSTOSCOPY W/ URETERAL STENT PLACEMENT: SHX1429

## 2020-10-24 HISTORY — PX: CYSTOSCOPY WITH URETHRAL DILATATION: SHX5125

## 2020-10-24 HISTORY — PX: PARTIAL COLECTOMY: SHX5273

## 2020-10-24 LAB — CBC
HCT: 25 % — ABNORMAL LOW (ref 39.0–52.0)
HCT: 29.4 % — ABNORMAL LOW (ref 39.0–52.0)
Hemoglobin: 7.9 g/dL — ABNORMAL LOW (ref 13.0–17.0)
Hemoglobin: 9.1 g/dL — ABNORMAL LOW (ref 13.0–17.0)
MCH: 23.3 pg — ABNORMAL LOW (ref 26.0–34.0)
MCH: 24.1 pg — ABNORMAL LOW (ref 26.0–34.0)
MCHC: 31.6 g/dL (ref 30.0–36.0)
MCHC: 31 g/dL (ref 30.0–36.0)
MCV: 73.7 fL — ABNORMAL LOW (ref 80.0–100.0)
MCV: 77.8 fL — ABNORMAL LOW (ref 80.0–100.0)
Platelets: 64 10*3/uL — ABNORMAL LOW (ref 150–400)
Platelets: 77 10*3/uL — ABNORMAL LOW (ref 150–400)
RBC: 3.39 MIL/uL — ABNORMAL LOW (ref 4.22–5.81)
RBC: 3.78 MIL/uL — ABNORMAL LOW (ref 4.22–5.81)
RDW: 18.6 % — ABNORMAL HIGH (ref 11.5–15.5)
RDW: 19.7 % — ABNORMAL HIGH (ref 11.5–15.5)
WBC: 5.6 10*3/uL (ref 4.0–10.5)
WBC: 6.1 10*3/uL (ref 4.0–10.5)
nRBC: 0 % (ref 0.0–0.2)
nRBC: 0 % (ref 0.0–0.2)

## 2020-10-24 LAB — GLUCOSE, CAPILLARY
Glucose-Capillary: 98 mg/dL (ref 70–99)
Glucose-Capillary: 99 mg/dL (ref 70–99)
Glucose-Capillary: 93 mg/dL (ref 70–99)
Glucose-Capillary: 131 mg/dL — ABNORMAL HIGH (ref 70–99)

## 2020-10-24 LAB — POCT I-STAT 7, (LYTES, BLD GAS, ICA,H+H)
Acid-Base Excess: 1 mmol/L (ref 0.0–2.0)
Acid-Base Excess: 1 mmol/L (ref 0.0–2.0)
Bicarbonate: 26.7 mmol/L (ref 20.0–28.0)
Bicarbonate: 25.1 mmol/L (ref 20.0–28.0)
Calcium, Ion: 1.24 mmol/L (ref 1.15–1.40)
Calcium, Ion: 1.12 mmol/L — ABNORMAL LOW (ref 1.15–1.40)
HCT: 24 % — ABNORMAL LOW (ref 39.0–52.0)
HCT: 26 % — ABNORMAL LOW (ref 39.0–52.0)
Hemoglobin: 8.2 g/dL — ABNORMAL LOW (ref 13.0–17.0)
Hemoglobin: 8.8 g/dL — ABNORMAL LOW (ref 13.0–17.0)
O2 Saturation: 100 %
O2 Saturation: 100 %
Potassium: 4.1 mmol/L (ref 3.5–5.1)
Potassium: 4 mmol/L (ref 3.5–5.1)
Sodium: 138 mmol/L (ref 135–145)
Sodium: 138 mmol/L (ref 135–145)
TCO2: 28 mmol/L (ref 22–32)
TCO2: 26 mmol/L (ref 22–32)
pCO2 arterial: 46.9 mmHg (ref 32.0–48.0)
pCO2 arterial: 35.5 mmHg (ref 32.0–48.0)
pH, Arterial: 7.363 (ref 7.350–7.450)
pH, Arterial: 7.458 — ABNORMAL HIGH (ref 7.350–7.450)
pO2, Arterial: 272 mmHg — ABNORMAL HIGH (ref 83.0–108.0)
pO2, Arterial: 214 mmHg — ABNORMAL HIGH (ref 83.0–108.0)

## 2020-10-24 LAB — SURGICAL PCR SCREEN
MRSA, PCR: NEGATIVE
Staphylococcus aureus: POSITIVE — AB

## 2020-10-24 LAB — BASIC METABOLIC PANEL
Anion gap: 6 (ref 5–15)
BUN: 26 mg/dL — ABNORMAL HIGH (ref 8–23)
CO2: 25 mmol/L (ref 22–32)
Calcium: 8.4 mg/dL — ABNORMAL LOW (ref 8.9–10.3)
Chloride: 104 mmol/L (ref 98–111)
Creatinine, Ser: 1.03 mg/dL (ref 0.61–1.24)
GFR, Estimated: >60 mL/min (ref >60)
Glucose, Bld: 109 mg/dL — ABNORMAL HIGH (ref 70–99)
Potassium: 4.1 mmol/L (ref 3.5–5.1)
Sodium: 135 mmol/L (ref 135–145)

## 2020-10-24 LAB — MAGNESIUM: Magnesium: 2 mg/dL (ref 1.7–2.4)

## 2020-10-24 LAB — PREPARE RBC (CROSSMATCH)

## 2020-10-24 SURGERY — COLECTOMY, PARTIAL
Anesthesia: General

## 2020-10-24 MED ORDER — KETAMINE HCL 50 MG/5ML IJ SOSY
PREFILLED_SYRINGE | INTRAMUSCULAR | Status: AC
  Filled 2020-10-24: qty 5

## 2020-10-24 MED ORDER — 0.9 % SODIUM CHLORIDE (POUR BTL) OPTIME
TOPICAL | Status: DC | PRN
  Administered 2020-10-24: 4000 mL

## 2020-10-24 MED ORDER — ORAL CARE MOUTH RINSE: 15.0000 mL | Freq: Once | OROMUCOSAL | Status: AC

## 2020-10-24 MED ORDER — MORPHINE SULFATE (PF) 2 MG/ML IV SOLN
1.0000 mg | INTRAVENOUS | Status: DC | PRN
  Administered 2020-10-24: 2 mg via INTRAVENOUS
  Filled 2020-10-24: qty 1
  Filled 2020-10-24: qty 2

## 2020-10-24 MED ORDER — CHLORHEXIDINE GLUCONATE 0.12 % MT SOLN: 15.0000 mL | Freq: Once | OROMUCOSAL | Status: AC

## 2020-10-24 MED ORDER — IOHEXOL 300 MG/ML  SOLN
INTRAMUSCULAR | Status: DC | PRN
  Administered 2020-10-24: 10 mL

## 2020-10-24 MED ORDER — PROMETHAZINE HCL 25 MG/ML IJ SOLN: 6.2500 mg | INTRAMUSCULAR | Status: DC | PRN

## 2020-10-24 MED ORDER — ROCURONIUM BROMIDE 10 MG/ML (PF) SYRINGE
PREFILLED_SYRINGE | INTRAVENOUS | Status: AC
  Filled 2020-10-24: qty 10

## 2020-10-24 MED ORDER — FENTANYL CITRATE (PF) 100 MCG/2ML IJ SOLN
INTRAMUSCULAR | Status: AC
  Filled 2020-10-24: qty 2

## 2020-10-24 MED ORDER — DEXAMETHASONE SODIUM PHOSPHATE 10 MG/ML IJ SOLN
INTRAMUSCULAR | Status: AC
  Filled 2020-10-24: qty 2

## 2020-10-24 MED ORDER — LACTATED RINGERS IV SOLN: INTRAVENOUS | Status: DC | PRN

## 2020-10-24 MED ORDER — PROPOFOL 10 MG/ML IV BOLUS
INTRAVENOUS | Status: DC | PRN
  Administered 2020-10-24: 140 mg via INTRAVENOUS

## 2020-10-24 MED ORDER — LABETALOL HCL 5 MG/ML IV SOLN
INTRAVENOUS | Status: DC | PRN
  Administered 2020-10-24: 5 mg via INTRAVENOUS

## 2020-10-24 MED ORDER — LIDOCAINE 2% (20 MG/ML) 5 ML SYRINGE
INTRAMUSCULAR | Status: DC | PRN
  Administered 2020-10-24: 100 mg via INTRAVENOUS

## 2020-10-24 MED ORDER — FENTANYL CITRATE (PF) 250 MCG/5ML IJ SOLN
INTRAMUSCULAR | Status: AC
  Filled 2020-10-24: qty 5

## 2020-10-24 MED ORDER — FENTANYL CITRATE (PF) 250 MCG/5ML IJ SOLN
INTRAMUSCULAR | Status: DC | PRN
  Administered 2020-10-24 (×3): 50 ug via INTRAVENOUS
  Administered 2020-10-24 (×2): 25 ug via INTRAVENOUS
  Administered 2020-10-24 (×2): 50 ug via INTRAVENOUS

## 2020-10-24 MED ORDER — LACTATED RINGERS IV SOLN: INTRAVENOUS | Status: DC

## 2020-10-24 MED ORDER — CHLORHEXIDINE GLUCONATE CLOTH 2 % EX PADS
6.0000 | MEDICATED_PAD | Freq: Every day | CUTANEOUS | Status: AC
  Administered 2020-10-24 – 2020-10-28 (×5): 6 via TOPICAL

## 2020-10-24 MED ORDER — MUPIROCIN 2 % EX OINT
1.0000 "application " | TOPICAL_OINTMENT | Freq: Two times a day (BID) | CUTANEOUS | Status: AC
  Administered 2020-10-24 – 2020-10-28 (×9): 1 via NASAL
  Filled 2020-10-24 (×2): qty 22

## 2020-10-24 MED ORDER — ACETAMINOPHEN 10 MG/ML IV SOLN
1000.0000 mg | Freq: Four times a day (QID) | INTRAVENOUS | Status: AC
  Administered 2020-10-24 – 2020-10-25 (×4): 1000 mg via INTRAVENOUS
  Filled 2020-10-24 (×4): qty 100

## 2020-10-24 MED ORDER — ONDANSETRON HCL 4 MG/2ML IJ SOLN
INTRAMUSCULAR | Status: DC | PRN
  Administered 2020-10-24: 4 mg via INTRAVENOUS

## 2020-10-24 MED ORDER — SODIUM CHLORIDE 0.9% IV SOLUTION: Freq: Once | INTRAVENOUS | Status: DC

## 2020-10-24 MED ORDER — METHOCARBAMOL 1000 MG/10ML IJ SOLN
500.0000 mg | Freq: Four times a day (QID) | INTRAVENOUS | Status: DC | PRN
  Administered 2020-10-24 – 2020-10-25 (×2): 500 mg via INTRAVENOUS
  Filled 2020-10-24 (×3): qty 5

## 2020-10-24 MED ORDER — PHENYLEPHRINE 40 MCG/ML (10ML) SYRINGE FOR IV PUSH (FOR BLOOD PRESSURE SUPPORT)
PREFILLED_SYRINGE | INTRAVENOUS | Status: AC
  Filled 2020-10-24: qty 10

## 2020-10-24 MED ORDER — SODIUM CHLORIDE 0.9% IV SOLUTION: Freq: Once | INTRAVENOUS | Status: AC

## 2020-10-24 MED ORDER — ROCURONIUM BROMIDE 10 MG/ML (PF) SYRINGE
PREFILLED_SYRINGE | INTRAVENOUS | Status: DC | PRN
  Administered 2020-10-24 (×2): 50 mg via INTRAVENOUS

## 2020-10-24 MED ORDER — DEXAMETHASONE SODIUM PHOSPHATE 10 MG/ML IJ SOLN
INTRAMUSCULAR | Status: DC | PRN
  Administered 2020-10-24: 10 mg via INTRAVENOUS

## 2020-10-24 MED ORDER — PHENYLEPHRINE HCL-NACL 10-0.9 MG/250ML-% IV SOLN
INTRAVENOUS | Status: DC | PRN
  Administered 2020-10-24: 25 ug/min via INTRAVENOUS

## 2020-10-24 MED ORDER — ACETAMINOPHEN 10 MG/ML IV SOLN: 1000.0000 mg | Freq: Once | INTRAVENOUS | Status: DC | PRN

## 2020-10-24 MED ORDER — CHLORHEXIDINE GLUCONATE 0.12 % MT SOLN
OROMUCOSAL | Status: AC
  Administered 2020-10-24: 15 mL via OROMUCOSAL
  Filled 2020-10-24: qty 15

## 2020-10-24 MED ORDER — LIDOCAINE 2% (20 MG/ML) 5 ML SYRINGE
INTRAMUSCULAR | Status: AC
  Filled 2020-10-24: qty 10

## 2020-10-24 MED ORDER — FENTANYL CITRATE (PF) 100 MCG/2ML IJ SOLN
25.0000 ug | INTRAMUSCULAR | Status: DC | PRN
  Administered 2020-10-24 (×2): 50 ug via INTRAVENOUS

## 2020-10-24 MED ORDER — ONDANSETRON HCL 4 MG/2ML IJ SOLN
INTRAMUSCULAR | Status: AC
  Filled 2020-10-24: qty 4

## 2020-10-24 MED ORDER — SUGAMMADEX SODIUM 200 MG/2ML IV SOLN
INTRAVENOUS | Status: DC | PRN
  Administered 2020-10-24: 200 mg via INTRAVENOUS

## 2020-10-24 MED ORDER — PHENYLEPHRINE 40 MCG/ML (10ML) SYRINGE FOR IV PUSH (FOR BLOOD PRESSURE SUPPORT)
PREFILLED_SYRINGE | INTRAVENOUS | Status: DC | PRN
  Administered 2020-10-24: 80 ug via INTRAVENOUS

## 2020-10-24 MED ORDER — KETAMINE HCL 10 MG/ML IJ SOLN
INTRAMUSCULAR | Status: DC | PRN
  Administered 2020-10-24: 10 mg via INTRAVENOUS
  Administered 2020-10-24: 30 mg via INTRAVENOUS

## 2020-10-24 SURGICAL SUPPLY — 56 items
ADAPTER GOLDBERG URETERAL (ADAPTER) ×1 IMPLANT
APPLIER CLIP 11 MED OPEN (CLIP) ×3
BAG URINE DRAIN 2000ML AR STRL (UROLOGICAL SUPPLIES) ×3 IMPLANT
BAG URO CATCHER STRL LF (MISCELLANEOUS) ×3 IMPLANT
BIOPATCH RED 1 DISK 7.0 (GAUZE/BANDAGES/DRESSINGS) ×1 IMPLANT
BLADE CLIPPER SURG (BLADE) ×1 IMPLANT
BNDG GAUZE ELAST 4 BULKY (GAUZE/BANDAGES/DRESSINGS) ×1 IMPLANT
CANISTER SUCT 3000ML PPV (MISCELLANEOUS) ×3 IMPLANT
CATH FOLEY 2WAY SLVR  5CC 16FR (CATHETERS) ×1
CATH FOLEY 2WAY SLVR 5CC 16FR (CATHETERS) IMPLANT
CATH URET 5FR 28IN OPEN ENDED (CATHETERS) ×2 IMPLANT
CLIP APPLIE 11 MED OPEN (CLIP) IMPLANT
DRAIN CHANNEL 19F RND (DRAIN) ×1 IMPLANT
DRAPE LAPAROSCOPIC ABDOMINAL (DRAPES) ×1 IMPLANT
DRSG PAD ABDOMINAL 8X10 ST (GAUZE/BANDAGES/DRESSINGS) ×1 IMPLANT
DRSG TEGADERM 4X10 (GAUZE/BANDAGES/DRESSINGS) ×1 IMPLANT
ELECT CAUTERY BLADE 6.4 (BLADE) ×5 IMPLANT
ELECT REM PT RETURN 9FT ADLT (ELECTROSURGICAL) ×3
ELECTRODE REM PT RTRN 9FT ADLT (ELECTROSURGICAL) ×2 IMPLANT
EVACUATOR SILICONE 100CC (DRAIN) ×1 IMPLANT
GLOVE BIO SURGEON STRL SZ7 (GLOVE) ×6 IMPLANT
GLOVE BIO SURGEON STRL SZ8 (GLOVE) ×4 IMPLANT
GLOVE BIOGEL PI IND STRL 7.5 (GLOVE) ×4 IMPLANT
GLOVE BIOGEL PI INDICATOR 7.5 (GLOVE) ×2
GOWN STRL REUS W/ TWL LRG LVL3 (GOWN DISPOSABLE) ×14 IMPLANT
GOWN STRL REUS W/ TWL XL LVL3 (GOWN DISPOSABLE) ×2 IMPLANT
GOWN STRL REUS W/TWL LRG LVL3 (GOWN DISPOSABLE) ×8
GOWN STRL REUS W/TWL XL LVL3 (GOWN DISPOSABLE) ×1
GUIDEWIRE STR DUAL SENSOR (WIRE) ×3 IMPLANT
HEMOSTAT HEMOBLAST BELLOWS (HEMOSTASIS) ×1 IMPLANT
KIT NEPHROMAX BALLN LONG (CATHETERS) ×1 IMPLANT
KIT OSTOMY DRAINABLE 2.75 STR (WOUND CARE) ×1 IMPLANT
KIT TURNOVER KIT B (KITS) ×3 IMPLANT
LIGASURE IMPACT 36 18CM CVD LR (INSTRUMENTS) ×1 IMPLANT
MANIFOLD NEPTUNE II (INSTRUMENTS) ×1 IMPLANT
NS IRRIG 1000ML POUR BTL (IV SOLUTION) ×6 IMPLANT
PACK COLON (CUSTOM PROCEDURE TRAY) ×3 IMPLANT
PACK CYSTO (CUSTOM PROCEDURE TRAY) ×3 IMPLANT
PAD ABD 8X10 STRL (GAUZE/BANDAGES/DRESSINGS) ×1 IMPLANT
PAD ARMBOARD 7.5X6 YLW CONV (MISCELLANEOUS) ×3 IMPLANT
PENCIL SMOKE EVACUATOR (MISCELLANEOUS) ×3 IMPLANT
SPONGE LAP 18X18 RF (DISPOSABLE) ×2 IMPLANT
STAPLER CVD CUT BL 40 RELOAD (ENDOMECHANICALS) ×3 IMPLANT
STAPLER CVD CUT BLU 40 RELOAD (ENDOMECHANICALS) IMPLANT
STAPLER PROXIMATE 75MM BLUE (STAPLE) ×1 IMPLANT
STAPLER VISISTAT 35W (STAPLE) ×3 IMPLANT
SUT ETHILON 2 0 FS 18 (SUTURE) ×1 IMPLANT
SUT PDS AB 1 TP1 96 (SUTURE) ×6 IMPLANT
SUT PROLENE 2 0 CT2 30 (SUTURE) ×1 IMPLANT
SUT SILK 2 0 SH CR/8 (SUTURE) ×4 IMPLANT
SUT SILK 2 0 TIES 10X30 (SUTURE) ×3 IMPLANT
SUT SILK 3 0 SH CR/8 (SUTURE) ×3 IMPLANT
SUT SILK 3 0 TIES 10X30 (SUTURE) ×3 IMPLANT
SUT VIC AB 3-0 SH 8-18 (SUTURE) ×2 IMPLANT
TAPE CLOTH SURG 6X10 WHT LF (GAUZE/BANDAGES/DRESSINGS) ×1 IMPLANT
TUBE CONNECTING 12X1/4 (SUCTIONS) ×7 IMPLANT

## 2020-10-24 NOTE — Anesthesia Procedure Notes (Signed)
Arterial Line Insertion Start/End5/26/2022 10:00 AM, 10/24/2020 10:05 AM Performed by: Pervis Hocking, DO, anesthesiologist  Patient location: Pre-op. Preanesthetic checklist: patient identified, IV checked, site marked, risks and benefits discussed, surgical consent, monitors and equipment checked, pre-op evaluation, timeout performed and anesthesia consent Lidocaine 1% used for infiltration Right, radial was placed Catheter size: 20 Fr Hand hygiene performed  and maximum sterile barriers used   Attempts: 1 Procedure performed without using ultrasound guided technique. Following insertion, dressing applied. Post procedure assessment: normal and unchanged  Patient tolerated the procedure well with no immediate complications.

## 2020-10-24 NOTE — Progress Notes (Signed)
Nutrition Follow-up  DOCUMENTATION CODES:   Severe malnutrition in context of chronic illness  INTERVENTION:   When diet advanced, resume:  Boost Breeze po TID, each supplement provides 250 kcal and 9 grams of protein  Prosource Plus 30 ml PO BID, each packet provides 100 kcal and 15 gm protein  NUTRITION DIAGNOSIS:   Severe Malnutrition related to chronic illness (COPD, new adenocarcinoma of the colon) as evidenced by severe muscle depletion,severe fat depletion,percent weight loss (8.4% weight loss in less than 2 months).  Ongoing  GOAL:   Patient will meet greater than or equal to 90% of their needs  Unmet, currently NPO  MONITOR:   PO intake,Supplement acceptance,Diet advancement,Labs,Weight trends,Skin  REASON FOR ASSESSMENT:   Consult (weight loss)  ASSESSMENT:   75 year old male who presented to the ED on 5/19 with tachycardia. PMH of bacteria endocarditis s/p MVR in 2018, DVT, DM, HTN, dementia, tobacco use, COPD, atrial fibrillation, basal cell carcinoma on L wrist/hand.  Patient is currently in the OR for sigmoid colectomy with colostomy. Currently NPO.  Labs reviewed.  CBG: 98-93  Medications reviewed and include Niferex.   Diet Order:   Diet Order            Diet NPO time specified  Diet effective midnight                 EDUCATION NEEDS:   Education needs have been addressed  Skin:  Skin Assessment: Skin Integrity Issues: Skin Integrity Issues:: Other (Comment) Other: basal cell carcinoma x 2 on LUE  Last BM:  5/24  Height:   Ht Readings from Last 1 Encounters:  10/24/20 6' (1.829 m)    Weight:   Wt Readings from Last 1 Encounters:  10/24/20 68.7 kg    BMI:  Body mass index is 20.54 kg/m.  Estimated Nutritional Needs:   Kcal:  2000-2200  Protein:  100-115 grams  Fluid:  >/= 2.0 L    Lucas Mallow, RD, LDN, CNSC Please refer to Amion for contact information.

## 2020-10-24 NOTE — Anesthesia Procedure Notes (Signed)
Central Venous Catheter Insertion Performed by: Brennan Bailey, MD, anesthesiologist Start/End5/26/2022 10:00 AM, 10/24/2020 10:07 AM Patient location: Pre-op. Preanesthetic checklist: patient identified, IV checked, site marked, risks and benefits discussed, surgical consent, monitors and equipment checked, pre-op evaluation, timeout performed and anesthesia consent Position: Trendelenburg Patient sedated Hand hygiene performed  and maximum sterile barriers used  Catheter size: 8.5 Fr MAC introducer Procedure performed using ultrasound guided technique. Ultrasound Notes:anatomy identified, needle tip was noted to be adjacent to the nerve/plexus identified, no ultrasound evidence of intravascular and/or intraneural injection and image(s) printed for medical record Attempts: 1 Following insertion, line sutured, dressing applied and Biopatch. Post procedure assessment: blood return through all ports, free fluid flow and no air  Patient tolerated the procedure well with no immediate complications. Additional procedure comments: CVC placed under sterile conditions in OR.[name]

## 2020-10-24 NOTE — H&P (Signed)
I have been asked to see the patient by Dr. Serita Grammes, for evaluation and management of large pelvic mass.  History of present illness: 75 year old male who presented to the emergency department and was found to have a large mass within his sigmoid and descending colon.  He is scheduled for resection of the mass, and as part of the operation Dr. Donne Hazel has requested ureteral stent placement.  Patient was in his usual state of health, states that he has had some urinary frequency but denies any dysuria or gross hematuria.   Review of systems: A 12 point comprehensive review of systems was obtained and is negative unless otherwise stated in the history of present illness.  Patient Active Problem List   Diagnosis Date Noted  . Protein-calorie malnutrition, severe 10/23/2020  . Occult blood in stools   . Iron deficiency anemia due to chronic blood loss   . Atrial flutter ([place]) 10/17/2020  . Microcytic anemia 10/17/2020  . COPD (chronic obstructive pulmonary disease) ([place]) 10/17/2020  . Atrial fibrillation with RVR ([place]) 10/17/2020  . Basal cell carcinoma (BCC) of skin of left wrist 09/04/2020  . Acute bronchitis with COPD ([place]) 01/29/2020  . Skin lesions 01/29/2020  . 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 Hx of bacterial endocarditis 01/29/2020  . Frequent falls 01/29/2020  . H/O mitral valve replacement 01/29/2020    No current facility-administered medications on file prior to encounter.   Current Outpatient Medications on File Prior to Encounter  Medication Sig Dispense Refill  . aspirin EC 81 MG tablet Take 81 mg by mouth daily. Swallow whole.    . furosemide (LASIX) 40 MG tablet Take 40 mg by mouth daily.    . Multiple Vitamin (MULTI-VITAMIN DAILY) TABS Take 1 tablet by mouth daily.      Past Medical History:  Diagnosis Date  . Anemia   . Atrial fibrillation ([place])    post operative in 2018  . Atrial flutter ([place])   . Bacteremia 2018  . Basal cell carcinoma   . COPD (chronic  obstructive pulmonary disease) ([place])   . Dementia ([place])   . Diabetes ([place])   . DVT of deep femoral vein ([place])   . Hypertension   . Thrombocytopenia ([place])     Past Surgical History:  Procedure Laterality Date  . BIOPSY  10/21/2020   Procedure: BIOPSY;  Surgeon: Doran Stabler, MD;  Location: [facility];  Service: Gastroenterology;;  . ESOPHAGOGASTRODUODENOSCOPY (EGD) WITH PROPOFOL N/A 10/21/2020   Procedure: ESOPHAGOGASTRODUODENOSCOPY (EGD) WITH PROPOFOL;  Surgeon: Doran Stabler, MD;  Location: [facility];  Service: Gastroenterology;  Laterality: N/A;  . MITRAL VALVE REPLACEMENT  07/2016   [facility] for endocarditis  . SIGMOIDOSCOPY  10/21/2020   Procedure: SIGMOIDOSCOPY;  Surgeon: Doran Stabler, MD;  Location: [facility];  Service: Gastroenterology;;  . Lia Foyer INJECTION  10/21/2020   Procedure: SUBMUCOSAL INJECTION;  Surgeon: Doran Stabler, MD;  Location: [facility] ENDOSCOPY;  Service: Gastroenterology;;  SPOT    Social History   Tobacco Use  . Smoking status: Current Every Day Smoker    Years: 62.00    Types: Cigarettes, Pipe  . Smokeless tobacco: Never Used  Vaping Use  . Vaping Use: Never used  Substance Use Topics  . Alcohol use: Not Currently    Comment: occasionally drinks beer 09/03/20  . Drug use: Not Currently    Comment: previous marijuana use    Family History  Problem Relation Age of Onset  . Hypertension  Other     PE: Vitals:   10/24/20 0719 10/24/20 0822 10/24/20 0837 10/24/20 0846  BP:  96/70 96/67   Pulse:  100 (!) 124   Resp:   20   Temp:  97.6 F (36.4 C) 97.8 F (36.6 C)   TempSrc:  Oral Oral   SpO2: 96% 100% 99%   Weight:    68.7 kg  Height:    6' (1.829 m)   Patient appears to be in no acute distress  patient is alert and oriented x3 Atraumatic normocephalic head No cervical or supraclavicular lymphadenopathy appreciated No increased work of breathing, no audible wheezes/rhonchi Regular sinus rhythm/rate Abdomen  is soft, nontender, nondistended, no CVA or suprapubic tenderness Lower extremities are symmetric without appreciable edema Grossly neurologically intact No identifiable skin lesions  Recent Labs    10/22/20 0131 10/23/20 0048 10/24/20 0316  WBC 6.1 10.2 5.6  HGB 7.4* 8.2* 7.9*  HCT 24.2* 26.1* 25.0*   Recent Labs    10/22/20 0131 10/23/20 0048 10/24/20 0316  NA 137 134* 135  K 4.2 3.9 4.1  CL 104 103 104  CO2 27 24 25   GLUCOSE 103* 108* 109*  BUN 25* 20 26*  CREATININE 1.25* 1.12 1.03  CALCIUM 8.7* 8.6* 8.4*   No results for input(s): LABPT, INR in the last 72 hours. No results for input(s): LABURIN in the last 72 hours. Results for orders placed or performed during the hospital encounter of 10/17/20  SARS CORONAVIRUS 2 (TAT 6-24 HRS) Nasopharyngeal Nasopharyngeal Swab     Status: None   Collection Time: 10/17/20  8:02 PM   Specimen: Nasopharyngeal Swab  Result Value Ref Range Status   SARS Coronavirus 2 NEGATIVE NEGATIVE Final    Comment: (NOTE) SARS-CoV-2 target nucleic acids are NOT DETECTED.  The SARS-CoV-2 RNA is generally detectable in upper and lower respiratory specimens during the acute phase of infection. Negative results do not preclude SARS-CoV-2 infection, do not rule out co-infections with other pathogens, and should not be used as the sole basis for treatment or other patient management decisions. Negative results must be combined with clinical observations, patient history, and epidemiological information. The expected result is Negative.  Fact Sheet for Patients: SugarRoll.be  Fact Sheet for Healthcare Providers: https://www.woods-mathews.com/  This test is not yet approved or cleared by the [place] FDA and  has been authorized for detection and/or diagnosis of SARS-CoV-2 by FDA under an Emergency Use Authorization (EUA). This EUA will remain  in effect (meaning this test can be used) for the  duration of the COVID-19 declaration under Se ction 564(b)(1) of the Act, 21 U.S.C. section 360bbb-3(b)(1), unless the authorization is terminated or revoked sooner.  Performed at [facility], [place] [address] [place], [place] [address]   MRSA PCR Screening     Status: None   Collection Time: 10/18/20  2:15 PM   Specimen: Nasopharyngeal  Result Value Ref Range Status   MRSA by PCR NEGATIVE NEGATIVE Final    Comment:        The GeneXpert MRSA Assay (FDA approved for NASAL specimens only), is one component of a comprehensive MRSA colonization surveillance program. It is not intended to diagnose MRSA infection nor to guide or monitor treatment for MRSA infections. Performed at [facility], [place] [address] [place], [place] [address]   Surgical pcr screen     Status: Abnormal   Collection Time: 10/24/20 12:35 AM   Specimen: Nasal Mucosa; Nasal Swab  Result  Value Ref Range Status   MRSA, PCR NEGATIVE NEGATIVE Final   Staphylococcus aureus POSITIVE (A) NEGATIVE Final    Comment: (NOTE) The Xpert SA Assay (FDA approved for NASAL specimens in patients 15 years of age and older), is one component of a comprehensive surveillance program. It is not intended to diagnose infection nor to guide or monitor treatment. Performed at [facility], [place] [address] [place], [place] [address]     Imaging: Reviewed the patient's CT scan demonstrating a large pelvic mass, there is no hydronephrosis or any other GU abnormality.  Imp/Recommendations:  Large pelvic mass, concerning for large colon cancer.  As part of the operation Dr. Donne Hazel has recommended that I placed stents in each ureter to help facilitate the dissection.  I went through the cystoscopy retrograde pyelogram and stent placement with the patient and answered all his questions. Consent was signed after he agreed to proceed.     Ardis Hughs

## 2020-10-24 NOTE — Op Note (Signed)
Preoperative diagnosis: Partially obstructing sigmoid colon cancer, anemia Postoperative diagnosis: Same as above Procedure: 1.  Sigmoid colectomy 2.  End colostomy Surgeon: Dr. Serita Grammes Assistant: Saverio Danker, PA-C Estimated blood loss: 250 cc Anesthesia: General with bilateral tap block Complications: None  drains: 19 [place] Blake drain to pelvis Specimens: Sigmoid colon stitch marks proximal Disposition to recovery in stable condition Sponge needle count was correct at completion  Indications: This is a 75 year old male with multiple medical problems including atrial fibrillation on anticoagulation.  He was admitted with anemia.  He is undergone evaluation and and he has a very large bulky sigmoid colon tumor that is partially obstructing.  He has some concern for stage IV disease on his imaging but this is not definitive.  Due to the fact that he cannot get anticoagulated with these tumor in place as well as the partial obstructing nature of it we discussed going to the operating room for sigmoid colectomy.  He is a smoker with a prealbumin of less than 10 so I discussed the colostomy with him.  Procedure: After informed consent was obtained urology first placed bilateral ureteral stents due to the bulky nature of the tumor.  He was under general anesthesia.  He was given antibiotics.  SCDs were in place.  I did give him Lovenox prior to surgery.  He was prepped and draped in a standard sterile surgical fashion.  A surgical timeout was then performed.  I made a midline incision.  I entered into the abdomen without difficulty.  I placed the Bookwalter retractor.  The remainder of his colon, liver, stomach, and small bowel were all normal.  He had a very large bulky sigmoid tumor that was adherent to his sidewall.  I released the white line of Toldt proximal.  I then divided the colon in the left colon using the GIA stapler well proximal from the tumor.  I then used the LigaSure device  and a combination of silk sutures to divide the mesentery down to where this was stuck to the sidewall.  With a combination of cautery as well as blunt dissection I was able to remove this from the sidewall.  There was still some rind that was left on the sidewall as well as towards the iliac vessels and the ureter.  The stent was not disturbed.  Once I did this I was able to release the mesentery posteriorly with the LigaSure device and some suture ligatures of 2-0 silk.  I was able to get at least 5 cm distal to the tumor as well as the tattoo that had been placed.  I then used a contour stapler to divide this.  This was then marked as above and passed off the table as a specimen.  Everything was oozing at the completion.  I bovied a variety of areas as well as put suture ligatures on anything that was bleeding.  I bovied a small area in the staple line.  This appeared to be hemostatic upon completion.  I irrigated copiously.  I then placed a 2-0 Prolene on the rectal stump.  I did place some hemoblast in the pelvis overlying some of the raw surface areas.  I placed a 33 [place] Blake drain and put it in the pelvis and secured this with a 2-0 nylon suture.  I then made a circular incision remove the skin and a core of tissue in the left upper quadrant where he had been marked previously down to the fascia.  I made  a cruciate incision in the fascia.  I then entered into the peritoneum after spreading the muscle.  I brought the colon up through this.  This was without any twisting.  This was viable and reached without difficulty.  I then proceeded to close the abdomen with #1 looped PDS.  I placed a wet-to-dry dressing on this.  I then matured the stoma with 3-0 Vicryl suture.  An ostomy appliance was placed.  He tolerated this well and was extubated transferred to recovery room.

## 2020-10-24 NOTE — Plan of Care (Signed)
  Problem: Education: Goal: Knowledge of General Education information will improve Description: Including pain rating scale, medication(s)/side effects and non-pharmacologic comfort measures Outcome: Progressing   Problem: Health Behavior/Discharge Planning: Goal: Ability to manage health-related needs will improve Outcome: Progressing   Problem: Clinical Measurements: Goal: Ability to maintain clinical measurements within normal limits will improve Outcome: Progressing Goal: Will remain free from infection Outcome: Progressing Goal: Diagnostic test results will improve Outcome: Progressing Goal: Respiratory complications will improve Outcome: Progressing Goal: Cardiovascular complication will be avoided Outcome: Progressing   Problem: Activity: Goal: Risk for activity intolerance will decrease Outcome: Progressing   Problem: Nutrition: Goal: Adequate nutrition will be maintained Outcome: Progressing   Problem: Coping: Goal: Level of anxiety will decrease Outcome: Progressing   Problem: Elimination: Goal: Will not experience complications related to urinary retention Outcome: Progressing   Problem: Pain Managment: Goal: General experience of comfort will improve Outcome: Progressing   Problem: Safety: Goal: Ability to remain free from injury will improve Outcome: Progressing   

## 2020-10-24 NOTE — Anesthesia Preprocedure Evaluation (Addendum)
Anesthesia Evaluation  Patient identified by MRN, date of birth, ID band Patient awake    Reviewed: Allergy & Precautions, NPO status , Patient's Chart, lab work & pertinent test results  History of Anesthesia Complications Negative for: history of anesthetic complications  Airway Mallampati: II  TM Distance: >3 FB Neck ROM: Full    Dental  (+) Poor Dentition, Missing,    Pulmonary COPD,  COPD inhaler, Current Smoker and Patient abstained from smoking.,    Pulmonary exam normal        Cardiovascular hypertension, Pt. on medications + CABG  Normal cardiovascular exam+ dysrhythmias Atrial Fibrillation   TTE 10/19/20: EF 65-70%, mild RVE, RVSP 27.24mmHg, mitral valve has been repaired/replaced (2018)   Neuro/Psych Dementia negative neurological ROS     GI/Hepatic negative GI ROS, Neg liver ROS, Colon ca   Endo/Other  diabetes, Type 2  Renal/GU negative Renal ROS  negative genitourinary   Musculoskeletal negative musculoskeletal ROS (+)   Abdominal   Peds  Hematology  (+) anemia , Hgb 7.9, plts 64   Anesthesia Other Findings Day of surgery medications reviewed with patient.  Reproductive/Obstetrics negative OB ROS                            Anesthesia Physical Anesthesia Plan  ASA: IV  Anesthesia Plan: General   Post-op Pain Management: GA combined w/ Regional for post-op pain   Induction: Intravenous  PONV Risk Score and Plan: 3 and Treatment may vary due to age or medical condition, Ondansetron and Dexamethasone  Airway Management Planned: Oral ETT  Additional Equipment: Arterial line, CVP and Ultrasound Guidance Line Placement  Intra-op Plan:   Post-operative Plan: Possible Post-op intubation/ventilation  Informed Consent: I have reviewed the patients History and Physical, chart, labs and discussed the procedure including the risks, benefits and alternatives for the proposed  anesthesia with the patient or authorized representative who has indicated his/her understanding and acceptance.     Dental advisory given  Plan Discussed with: CRNA  Anesthesia Plan Comments:        Anesthesia Quick Evaluation

## 2020-10-24 NOTE — Anesthesia Procedure Notes (Signed)
Anesthesia Regional Block: TAP block   Pre-Anesthetic Checklist: ,, timeout performed, Correct Patient, Correct Site, Correct Laterality, Correct Procedure, Correct Position, site marked, Risks and benefits discussed,  Surgical consent,  Pre-op evaluation,  At surgeon's request and post-op pain management  Laterality: Left and Right  Prep: Maximum Sterile Barrier Precautions used, chloraprep       Needles:  Injection technique: Single-shot  Needle Type: Echogenic Stimulator Needle     Needle Length: 9cm  Needle Gauge: 22     Additional Needles:   Procedures:,,,, ultrasound used (permanent image in chart),,,,  Narrative:  Start time: 10/24/2020 9:56 AM End time: 10/24/2020 10:00 AM Injection made incrementally with aspirations every 5 mL.  Performed by: Personally  Anesthesiologist: Pervis Hocking, DO  Additional Notes: Monitors applied. No increased pain on injection. No increased resistance to injection. Injection made in 5cc increments. Good needle visualization. Patient tolerated procedure well.

## 2020-10-24 NOTE — Transfer of Care (Signed)
Immediate Anesthesia Transfer of Care Note  Patient: [name]  Procedure(s) Performed: OPEN PARTIAL COLECTOMY WTH COLOSTOMY (N/A ) CYSTOSCOPY WITH RETROGRADE PYELOGRAM/URETERAL STENT PLACEMENT (N/A ) URETHRAL DILATATION  Patient Location: PACU  Anesthesia Type:GA combined with regional for post-op pain  Level of Consciousness: drowsy  Airway & Oxygen Therapy: Patient Spontanous Breathing and Patient connected to face mask oxygen  Post-op Assessment: Report given to RN and Post -op Vital signs reviewed and stable  Post vital signs: Reviewed and stable  Last Vitals:  Vitals Value Taken Time  BP 130/91 10/24/20 1234  Temp    Pulse 113 10/24/20 1238  Resp 17 10/24/20 1238  SpO2 100 % 10/24/20 1238  Vitals shown include unvalidated device data.  Last Pain:  Vitals:   10/24/20 0837  TempSrc: Oral  PainSc:          Complications: No complications documented.

## 2020-10-24 NOTE — Progress Notes (Signed)
PROGRESS NOTE    [name]  SAY:[id number] DOB: 1945-08-10 DOA: 10/17/2020 PCP: Pcp, No   Brief Narrative:  75 years old male with PMH significant for bacterial endocarditis s/p mitral valve replacement in 2018, DVT, DM, HTN, dementia, tobacco use, COPD, atrial fibrillation, sarcoidosis, basal cell carcinoma who was transferred from echo lab to the ER with heart rate in 140s.  Patient denies any symptoms.  Patient states that he is unaware that his heart rate is elevated.  EKG shows atrial flutter with RVR.  Patient is also hypotensive.  He has not followed up with his regular doctor in several years.  Patient was seen by cardiologist and was started on digoxin and Cardizem p.o.  Apparently Cardizem IV drip dropped his blood pressure drastically.  Plan was to do TEE and DC cardioversion however over the weekend patient had guaiac positive stools and GI was consulted and patient had EGD and colonoscopy done on 10/21/2020 which showed a possible partially obstructing mass in the sigmoid colon.  Allergy confirms adenocarcinoma.  Assessment & Plan:   Active Problems:   Hx of bacterial endocarditis   H/O mitral valve replacement   Basal cell carcinoma (BCC) of skin of left wrist   Atrial flutter (HCC)   Microcytic anemia   COPD (chronic obstructive pulmonary disease) (HCC)   Atrial fibrillation with RVR (HCC)   Occult blood in stools   Iron deficiency anemia due to chronic blood loss   Protein-calorie malnutrition, severe   Obstructing colonic mass adenocarcinoma -Back significant for adenocarcinoma, colonoscopy 5/23 significant for near obstructing mass --oncology consulted, with Dr. Benay Spice --Marvis Repress consulted Plan: --sigmoid colectomy with colostomy today --pain management --diet advance per surgery  A. fib/a flutter with RVR -Heart rate difficult to control in the setting of soft blood pressure  -Due to lower GI bleed is not a candidate for cardioversion. -Management per  cardiology, he is on p.o. Cardizem, amiodarone and digoxin. -TSH wnl Plan: --cont digoxin 0.125 mg daily --cont cardizem 120 mg daily --cont Toprol 50 mg daily  History of mitral valve replacement and infective endocarditis  - His ejection fraction is 65 to 70%.  Normal left ventricular function and no regional wall motion abnormalities.  Chronic blood loss anemia Severe iron deficiency anemia  -This is secondary to partially obstructing sigmoid adenocarcinoma . -Received IV iron .  Anemia panel consistent with severe iron deficiency anemia -s/p 1u pRBC for Hemoglobin is 7.4  - Appreciate GI input colonoscopy showed likely malignant partially obstructing tumor in the sigmoid colon which was biopsied. - EGD was essentially normal. Plan: --Monitor Hgb and transfuse to keep >7 --cont iron supplement (new)  COPD --cont daily bronchodilator  Current smoker -cessation advised.  He is not ready to give up smoking yet. --cont nicotine patch  basal cell carcinoma  --left hand covered with dressing.  Acute on chronic thrombocytopenia -with iron deficiency and guaiac positive stools with nonobstructing sigmoid mass with pending biopsy.  Patient has chronic thrombocytopenia which is worsened since going on anticoagulation.   --HIT panel neg Plan: --monitor plt  Hyperkalemia, resolved --s/p a dose of Lokelma  --monitor for now   Estimated body mass index is 20.54 kg/m as calculated from the following:   Height as of this encounter: 6' (1.829 m).   Weight as of this encounter: 68.7 kg.  DVT prophylaxis: SCD Code Status: Full code Family Communication:  disposition Plan:  Status is: Inpatient  Dispo: The patient is from: Home  Anticipated d/c is to: Home              Patient currently is not medically stable to d/c.  Just post-op, NPO    Difficult to place patient No    Consultants:   Cardiology   GI  General surgery  Procedures: EGD/colonoscopy  5/23  Antimicrobials: None  Subjective:  Went for surgery today.  Tolerated it well.     Objective: Vitals:   10/24/20 1435 10/24/20 1450 10/24/20 1504 10/24/20 1544  BP: (!) 117/58 104/60 (!) 106/55 108/62  Pulse: (!) 40 (!) 40 (!) 46 (!) 41  Resp: 15 (!) 22 15 16   Temp:   (!) 97.4 F (36.3 C) (!) 97.5 F (36.4 C)  TempSrc:    Oral  SpO2: 92% 96% 97% 96%  Weight:      Height:        Intake/Output Summary (Last 24 hours) at 10/24/2020 1757 Last data filed at 10/24/2020 1745 Gross per 24 hour  Intake 2376 ml  Output 1000 ml  Net 1376 ml   Filed Weights   10/18/20 0451 10/22/20 1550 10/24/20 0846  Weight: 81.2 kg 68.7 kg 68.7 kg    Examination:  Constitutional: NAD, AAOx3, talking on the phone HEENT: conjunctivae and lids normal, EOMI CV: No cyanosis.   RESP: normal respiratory effort, on RA GI: dressing over abdomen, drain outputting sanguinous fluids, ostomy bag small amount of sanguinous fluids  Extremities: No effusions, edema in BLE SKIN: warm, dry Neuro: II - XII grossly intact.   Foley present, some bloody urine in the tubing   Data Reviewed: I have personally reviewed following labs and imaging studies  CBC: Recent Labs  Lab 10/21/20 0056 10/22/20 0131 10/23/20 0048 10/24/20 0316 10/24/20 1016 10/24/20 1147 10/24/20 1459  WBC 6.7 6.1 10.2 5.6  --   --  6.1  HGB 10.3* 7.4* 8.2* 7.9* 8.2* 8.8* 9.1*  HCT 32.9* 24.2* 26.1* 25.0* 24.0* 26.0* 29.4*  MCV 71.8* 72.2* 73.7* 73.7*  --   --  77.8*  PLT 63* 72* 64* 64*  --   --  77*   Basic Metabolic Panel: Recent Labs  Lab 10/20/20 0802 10/21/20 0056 10/21/20 1412 10/22/20 0131 10/23/20 0048 10/24/20 0316 10/24/20 1016 10/24/20 1147  NA 136 138  --  137 134* 135 138 138  K 3.9 5.3*  --  4.2 3.9 4.1 4.1 4.0  CL 106 106  --  104 103 104  --   --   CO2 24 23  --  27 24 25   --   --   GLUCOSE 90 101*  --  103* 108* 109*  --   --   BUN 26* 22  --  25* 20 26*  --   --   CREATININE 1.01 1.14  --   1.25* 1.12 1.03  --   --   CALCIUM 8.7* 9.3  --  8.7* 8.6* 8.4*  --   --   MG  --   --  1.9  --   --  2.0  --   --    GFR: Estimated Creatinine Clearance: 59.3 mL/min (by C-G formula based on SCr of 1.03 mg/dL). Liver Function Tests: Recent Labs  Lab 10/20/20 0802 10/21/20 0056 10/22/20 0131 10/23/20 0048  AST 16 50* 12* 15  ALT 23 18 18 19   ALKPHOS 58 71 57 63  BILITOT 1.0 1.2 0.7 1.2  PROT 6.4* 7.8 5.8* 6.0*  ALBUMIN 2.3* 3.0* 2.3* 2.3*  No results for input(s): LIPASE, AMYLASE in the last 168 hours. No results for input(s): AMMONIA in the last 168 hours. Coagulation Profile: No results for input(s): INR, PROTIME in the last 168 hours. Cardiac Enzymes: No results for input(s): CKTOTAL, CKMB, CKMBINDEX, TROPONINI in the last 168 hours. BNP (last 3 results) No results for input(s): PROBNP in the last 8760 hours. HbA1C: No results for input(s): HGBA1C in the last 72 hours. CBG: Recent Labs  Lab 10/23/20 2126 10/24/20 0615 10/24/20 0818 10/24/20 0840 10/24/20 1248  GLUCAP 130* 98 99 93 131*   Lipid Profile: No results for input(s): CHOL, HDL, LDLCALC, TRIG, CHOLHDL, LDLDIRECT in the last 72 hours. Thyroid Function Tests: Recent Labs    10/22/20 1233  TSH 1.445   Anemia Panel: Recent Labs    10/23/20 1801  VITAMINB12 360  FOLATE 28.1   Sepsis Labs: No results for input(s): PROCALCITON, LATICACIDVEN in the last 168 hours.  Recent Results (from the past 240 hour(s))  SARS CORONAVIRUS 2 (TAT 6-24 HRS) Nasopharyngeal Nasopharyngeal Swab     Status: None   Collection Time: 10/17/20  8:02 PM   Specimen: Nasopharyngeal Swab  Result Value Ref Range Status   SARS Coronavirus 2 NEGATIVE NEGATIVE Final    Comment: (NOTE) SARS-CoV-2 target nucleic acids are NOT DETECTED.  The SARS-CoV-2 RNA is generally detectable in upper and lower respiratory specimens during the acute phase of infection. Negative results do not preclude SARS-CoV-2 infection, do not rule  out co-infections with other pathogens, and should not be used as the sole basis for treatment or other patient management decisions. Negative results must be combined with clinical observations, patient history, and epidemiological information. The expected result is Negative.  Fact Sheet for Patients: SugarRoll.be  Fact Sheet for Healthcare Providers: https://www.woods-mathews.com/  This test is not yet approved or cleared by the [place] FDA and  has been authorized for detection and/or diagnosis of SARS-CoV-2 by FDA under an Emergency Use Authorization (EUA). This EUA will remain  in effect (meaning this test can be used) for the duration of the COVID-19 declaration under Se ction 564(b)(1) of the Act, 21 U.S.C. section 360bbb-3(b)(1), unless the authorization is terminated or revoked sooner.  Performed at [facility], [place] [address], [place], [place] [address]   MRSA PCR Screening     Status: None   Collection Time: 10/18/20  2:15 PM   Specimen: Nasopharyngeal  Result Value Ref Range Status   MRSA by PCR NEGATIVE NEGATIVE Final    Comment:        The GeneXpert MRSA Assay (FDA approved for NASAL specimens only), is one component of a comprehensive MRSA colonization surveillance program. It is not intended to diagnose MRSA infection nor to guide or monitor treatment for MRSA infections. Performed at [facility], [place] [address] [place], [place] [address]   Surgical pcr screen     Status: Abnormal   Collection Time: 10/24/20 12:35 AM   Specimen: Nasal Mucosa; Nasal Swab  Result Value Ref Range Status   MRSA, PCR NEGATIVE NEGATIVE Final   Staphylococcus aureus POSITIVE (A) NEGATIVE Final    Comment: (NOTE) The Xpert SA Assay (FDA approved for NASAL specimens in patients 43 years of age and older), is one component of a comprehensive surveillance program. It is not intended to diagnose infection nor to guide  or monitor treatment. Performed at [facility], [place] [address], [place], [place] [address]  Radiology Studies: DG Retrograde Pyelogram  Result Date: 10/24/2020 CLINICAL DATA:  75 year old male with colorectal cancer . EXAM: RETROGRADE PYELOGRAM COMPARISON:  CT chest, abdomen and pelvis 10/22/2020 FINDINGS: A total of 3 intraoperative saved images obtained during bilateral double-J ureteral stent placement are submitted for review. The images demonstrate a catheter in the bladder with contrast opacification of the bladder. Partial bilateral nephroureterograms. A ureteral stent is partially visualized on the left. No focal abnormality of the visualized portions of either ureter. IMPRESSION: Ureteropyelogram with placement of bilateral ureteral stents. Electronically Signed   By: Jacqulynn Cadet M.D.   On: 10/24/2020 11:14   DG CHEST PORT 1 VIEW  Result Date: 10/24/2020 CLINICAL DATA:  Postop central line placement EXAM: PORTABLE CHEST 1 VIEW COMPARISON:  10/17/2020 FINDINGS: Right jugular catheter is been placed with the tip in the mid SVC. No pneumothorax Median sternotomy. Negative for heart failure. Underlying chronic lung disease. No acute infiltrate or effusion. IMPRESSION: Central line placement into the SVC.  No pneumothorax. Electronically Signed   By: Franchot Gallo M.D.   On: 10/24/2020 13:45        Scheduled Meds: . sodium chloride   Intravenous Once  . sodium chloride   Intravenous Once  . Chlorhexidine Gluconate Cloth  6 each Topical Q0600  . digoxin  0.125 mg Oral Daily  . diltiazem  120 mg Oral QHS  . fentaNYL      . iron polysaccharides  150 mg Oral Daily  . metoprolol succinate  50 mg Oral Daily  . mupirocin ointment  1 application Nasal BID  . nicotine  14 mg Transdermal Daily  . umeclidinium bromide  1 puff Inhalation Daily   Continuous Infusions: . acetaminophen 1,000 mg (10/24/20 1739)  . methocarbamol (ROBAXIN) IV       LOS: 7 days    Enzo Bi, MD  10/24/2020, 5:57 PM

## 2020-10-24 NOTE — Anesthesia Procedure Notes (Signed)
Procedure Name: Intubation Date/Time: 10/24/2020 9:53 AM Performed by: Candis Shine, CRNA Pre-anesthesia Checklist: Patient identified, Emergency Drugs available, Suction available and Patient being monitored Patient Re-evaluated:Patient Re-evaluated prior to induction Oxygen Delivery Method: Circle System Utilized Preoxygenation: Pre-oxygenation with 100% oxygen Induction Type: IV induction Ventilation: Mask ventilation without difficulty Laryngoscope Size: Mac and 4 Grade View: Grade I Tube type: Oral Tube size: 7.5 mm Number of attempts: 1 Airway Equipment and Method: Stylet Placement Confirmation: ETT inserted through vocal cords under direct vision,  positive ETCO2 and breath sounds checked- equal and bilateral Secured at: 22 cm Tube secured with: Tape Dental Injury: Teeth and Oropharynx as per pre-operative assessment

## 2020-10-24 NOTE — Op Note (Signed)
Preoperative diagnosis:  1. Pelvic Mass  2.   Postoperative diagnosis:  1. Same 2. Anterior urethral stricture   Procedure: 1. Cystoscopy, retrograde pyelogram with interpretation 2. Bilateral temporary ureteral stent placement 3. Urethral balloon dilation   Surgeon: Ardis Hughs, MD   Anesthesia: General   Complications: None   Intraoperative findings:   #1)  Left retrograde pyelogram was performed using 10cc of Omnipaque contrast using a 5 [place] open-ended ureteral catheter demonstrating a normal caliber ureter with no significant hydroureteronephrosis or filling defects. #2: Right retrograde pyelogram was performed using  10cc  Omnipaque contrast using a 5 [place] open-ended ureteral catheter demonstrating a normal caliber ureter with no significant filling defect or hydroureteronephrosis. #3: The patient's meatus was stenosed I was unable to pass the cystoscope.  I used a Claiborne Billings and was able to dilate the meatus, but in the fossa navicularis and anterior urethra there was additional stricture.  I was unable to advance the scope across it and instead advanced a wire across it and then a balloon dilator.  Once it was dilated it was wide open.  EBL: Minimal   Specimens: None   Indication: Mr. Mergenthaler is a 75 y.o.  patient with  large pelvic mass concerning for colon cancer.  Dr. Donne Hazel requested temporary ureteral stents to help facilitate the dissection of the sigmoid colon.  After reviewing the management options for treatment, he elected to proceed with the above surgical procedure(s). We have discussed the potential benefits and risks of the procedure, side effects of the proposed treatment, the likelihood of the patient achieving the goals of the procedure, and any potential problems that might occur during the procedure or recuperation. Informed consent has been obtained.   Description of procedure:   The patient was taken to the operating room and general anesthesia  was induced.  The patient was placed in the dorsal lithotomy position, prepped and draped in the usual sterile fashion, and preoperative antibiotics were administered. A preoperative time-out was performed.   I was unable to pass the cystoscope into the patient's urethra.  I subsequently used the  Hemostats and dilated the urethral meatus.  Then once I was able to visualize the fossa macularis I noted that there was a stricture which I was unable to pass with the scope.  I subsequently advanced a wire up into the scope and beyond the meatus.  With fluoroscopy I confirmed that that wire was within the bladder.  I subsequently advanced an open-ended catheter into the bladder over the wire and injected contrast to confirm I was in the appropriate position.  I then we exchanged the wire and advanced a 30 [place] NephroMax balloon dilator across the wire and inflated up to 12 cm H2O.  I left the balloon up for approximately 60 seconds before removing the balloon dilator and subsequently passing the 21 French 30 degree cystoscope.   A 21 French 30 degree cystoscope was gently passed through the patient's urethra into the bladder.  The bladder was subsequently emptied and then filled slowly up performing a 360 degrees cystoscopic evaluation.  The patient did have a large median lobe, in addition he had orthotopic ureteral orifices, normal bladder mucosa with no evidence of colovesical fistula without mucosal abnormality.   I then advanced a 5 [place] open-ended ureteral catheter into the patient's left ureteral orifice and performed retrograde pyelogram with the above findings.  I then advanced the catheter up to the renal pelvis.  Subsequently turned my attention to the  patient's right ureteral orifice and using a second open-ended catheter advanced it into the right ureteral orifice performing a retrograde pyelogram with the above findings.  I then advanced the stent up to the renal pelvis under fluoroscopic  guidance.  The bladder was subsequently emptied and the stents were kept in place removing the scope over the stents.  I then  placed a 16 [place] Foley with a Freight forwarder.  The stents were then attached to the [facility] adapter and then tied to the Foley with a silk tie.    The surgery was then turned over to Dr. Donne Hazel for facilitation of the remainder of the case.

## 2020-10-24 NOTE — Progress Notes (Signed)
Day of Surgery   Subjective/Chief Complaint: No change overnight   Objective: Vital signs in last 24 hours: Temp:  [97.6 F (36.4 C)-98.4 F (36.9 C)] 97.8 F (36.6 C) (05/26 0837) Pulse Rate:  [43-124] 124 (05/26 0837) Resp:  [16-20] 20 (05/26 0837) BP: (92-108)/(57-70) 96/67 (05/26 0837) SpO2:  [96 %-100 %] 99 % (05/26 0837) Weight:  [68.7 kg] 68.7 kg (05/26 0846) Last BM Date: 10/22/20  Intake/Output from previous day: 05/25 0701 - 05/26 0700 In: 240 [P.O.:240] Out: 200 [Urine:200] Intake/Output this shift: No intake/output data recorded.  GI: soft nt palpable tumor llq  Lab Results:  Recent Labs    10/23/20 0048 10/24/20 0316  WBC 10.2 5.6  HGB 8.2* 7.9*  HCT 26.1* 25.0*  PLT 64* 64*   BMET Recent Labs    10/23/20 0048 10/24/20 0316  NA 134* 135  K 3.9 4.1  CL 103 104  CO2 24 25  GLUCOSE 108* 109*  BUN 20 26*  CREATININE 1.12 1.03  CALCIUM 8.6* 8.4*   PT/INR No results for input(s): LABPROT, INR in the last 72 hours. ABG No results for input(s): PHART, HCO3 in the last 72 hours.  Invalid input(s): PCO2, PO2  Studies/Results: CT CHEST ABDOMEN PELVIS W CONTRAST  Result Date: 10/22/2020 CLINICAL DATA:  75 year old male with history of colorectal cancer. Staging examination. EXAM: CT CHEST, ABDOMEN, AND PELVIS WITH CONTRAST TECHNIQUE: Multidetector CT imaging of the chest, abdomen and pelvis was performed following the standard protocol during bolus administration of intravenous contrast. CONTRAST:  134mL OMNIPAQUE IOHEXOL 300 MG/ML  SOLN COMPARISON:  No priors. FINDINGS: CT CHEST FINDINGS Cardiovascular: Heart size is normal. There is no significant pericardial fluid, thickening or pericardial calcification. Aortic atherosclerosis. No definite coronary artery calcifications. Status post median sternotomy for mitral valve replacement. Mediastinum/Nodes: No pathologically enlarged mediastinal or hilar lymph nodes. Esophagus is unremarkable in appearance.  No axillary lymphadenopathy. Lungs/Pleura: Rounded area of airspace consolidation in the posterior aspect of the left lower lobe (axial image 125 of series 5), likely a focus of infection. Small pulmonary nodules are noted measuring 6 mm in the inferior aspect of the lingula (axial image 101 of series 5), 5 mm in the periphery of the left lower lobe (axial image 72 of series 5), and 5 mm in the posterior aspect of the right lower lobe (axial image 94 of series 5). Areas of linear scarring or atelectasis are noted in the upper lobes of the lungs bilaterally. Mild paraseptal emphysema. Musculoskeletal: Median sternotomy wires. There are no aggressive appearing lytic or blastic lesions noted in the visualized portions of the skeleton. CT ABDOMEN PELVIS FINDINGS Hepatobiliary: Multiple subcentimeter low-attenuation lesions scattered throughout the hepatic parenchyma, too small to characterize, but statistically likely to represent cysts. The larger of the low-attenuation hepatic lesions are compatible with simple cysts, measuring up to 11 mm in the periphery of segment 4A. No other suspicious hepatic lesions. No intra or extrahepatic biliary ductal dilatation. Gallbladder is normal in appearance. Pancreas: No pancreatic mass. No pancreatic ductal dilatation. No pancreatic or peripancreatic fluid collections or inflammatory changes. Spleen: Unremarkable. Adrenals/Urinary Tract: 1.5 cm low-attenuation lesion in the posterior aspect of the interpolar region of the left kidney, compatible with a simple cyst. Right kidney and bilateral adrenal glands are normal in appearance. No hydroureteronephrosis. Urinary bladder is normal in appearance. Stomach/Bowel: The appearance of the stomach is normal. There is no pathologic dilatation of small bowel or colon. Mass-like area of mural thickening in the sigmoid colon estimated to  measure approximately 10.4 x 6.3 x 6.3 cm (axial image 108 of series 3 and coronal image 55 of series  6). Focal direct extension of malignant appearing soft tissue posteriorly from the lesion (axial image 106 of series 3) into the sigmoid mesocolon. The appendix is not confidently identified and may be surgically absent. Regardless, there are no inflammatory changes noted adjacent to the cecum to suggest the presence of an acute appendicitis at this time. Vascular/Lymphatic: Aortic atherosclerosis, without evidence of aneurysm or dissection in the abdominal or pelvic vasculature. Enlarged right external iliac lymph node measuring 1.5 cm in short axis (axial image 106 of series 3). Several other prominent but nonenlarged lymph nodes are noted in the pelvis, largest of which measures up to 6 mm in the low left anterior pelvis (axial image 113 of series 3). Enlarged right common iliac lymph node (axial image 97 of series 3) measuring 1.6 cm in short axis. Reproductive: Prostate gland and seminal vesicles are unremarkable in appearance. Other: No significant volume of ascites.  No pneumoperitoneum. Musculoskeletal: There are no aggressive appearing lytic or blastic lesions noted in the visualized portions of the skeleton. IMPRESSION: 1. Large colonic mass estimated to measure approximately 10.4 x 6.3 x 6.3 cm in the sigmoid colon with pelvic lymphadenopathy indicative of metastatic disease, as above. Small pulmonary nodules are also noted, which could conceivably represent metastatic lesions as well. Attention on follow-up imaging is recommended to assess for the stability of these nodules. 2. Airspace consolidation in the left lower lobe concerning for potential pneumonia. 3. Aortic atherosclerosis. 4. Mild paraseptal emphysema. 5. Additional incidental findings, as above. Electronically Signed   By: Vinnie Langton M.D.   On: 10/22/2020 18:54    Anti-infectives: Anti-infectives (From admission, onward)   Start     Dose/Rate Route Frequency Ordered Stop   10/24/20 0600  cefoTEtan (CEFOTAN) 2 g in sodium chloride  0.9 % 100 mL IVPB        2 g 200 mL/hr over 30 Minutes Intravenous To ShortStay Surgical 10/23/20 2031 10/25/20 0600      Assessment/Plan: A fib/flutter - per cards H/O MVR - echo is stable  COPD/tobacco abuse - smokes pipe Basal cell carcinoma x2 of LUE - currently undergoing radiation therapy DM - per primary H/O HTN ABL anemia - secondary to sigmoid colon mass Thrombocytopenia - unclear etiology. plts 64K Moderated PCM - prealbumin 9  Partially obstructing adenocarcinoma of sigmoid colon -discussed with oncology yesterday -plan for sigmoid colectomy and colostomy today with stents by urology    Rolm Bookbinder 10/24/2020

## 2020-10-25 ENCOUNTER — Encounter (HOSPITAL_COMMUNITY): Payer: Self-pay | Admitting: General Surgery

## 2020-10-25 DIAGNOSIS — D630 Anemia in neoplastic disease: Secondary | ICD-10-CM | POA: Diagnosis not present

## 2020-10-25 DIAGNOSIS — K6389 Other specified diseases of intestine: Secondary | ICD-10-CM | POA: Diagnosis not present

## 2020-10-25 DIAGNOSIS — C187 Malignant neoplasm of sigmoid colon: Secondary | ICD-10-CM | POA: Diagnosis not present

## 2020-10-25 DIAGNOSIS — C44619 Basal cell carcinoma of skin of left upper limb, including shoulder: Secondary | ICD-10-CM | POA: Diagnosis not present

## 2020-10-25 DIAGNOSIS — I4891 Unspecified atrial fibrillation: Secondary | ICD-10-CM | POA: Diagnosis not present

## 2020-10-25 LAB — BPAM PLATELET PHERESIS
Blood Product Expiration Date: 202205262359
ISSUE DATE / TIME: 202205260921
Unit Type and Rh: 5100

## 2020-10-25 LAB — BASIC METABOLIC PANEL
Anion gap: 8 (ref 5–15)
Anion gap: 11 (ref 5–15)
BUN: 30 mg/dL — ABNORMAL HIGH (ref 8–23)
BUN: 37 mg/dL — ABNORMAL HIGH (ref 8–23)
CO2: 26 mmol/L (ref 22–32)
CO2: 21 mmol/L — ABNORMAL LOW (ref 22–32)
Calcium: 8.8 mg/dL — ABNORMAL LOW (ref 8.9–10.3)
Calcium: 8.7 mg/dL — ABNORMAL LOW (ref 8.9–10.3)
Chloride: 103 mmol/L (ref 98–111)
Chloride: 103 mmol/L (ref 98–111)
Creatinine, Ser: 1.86 mg/dL — ABNORMAL HIGH (ref 0.61–1.24)
Creatinine, Ser: 2.57 mg/dL — ABNORMAL HIGH (ref 0.61–1.24)
GFR, Estimated: 37 mL/min — ABNORMAL LOW (ref >60)
GFR, Estimated: 25 mL/min — ABNORMAL LOW (ref >60)
Glucose, Bld: 144 mg/dL — ABNORMAL HIGH (ref 70–99)
Glucose, Bld: 131 mg/dL — ABNORMAL HIGH (ref 70–99)
Potassium: 5.6 mmol/L — ABNORMAL HIGH (ref 3.5–5.1)
Potassium: 5 mmol/L (ref 3.5–5.1)
Sodium: 137 mmol/L (ref 135–145)
Sodium: 135 mmol/L (ref 135–145)

## 2020-10-25 LAB — CBC
HCT: 28.5 % — ABNORMAL LOW (ref 39.0–52.0)
Hemoglobin: 8.9 g/dL — ABNORMAL LOW (ref 13.0–17.0)
MCH: 24 pg — ABNORMAL LOW (ref 26.0–34.0)
MCHC: 31.2 g/dL (ref 30.0–36.0)
MCV: 76.8 fL — ABNORMAL LOW (ref 80.0–100.0)
Platelets: 84 10*3/uL — ABNORMAL LOW (ref 150–400)
RBC: 3.71 MIL/uL — ABNORMAL LOW (ref 4.22–5.81)
RDW: 19.2 % — ABNORMAL HIGH (ref 11.5–15.5)
WBC: 9.4 10*3/uL (ref 4.0–10.5)
nRBC: 0 % (ref 0.0–0.2)

## 2020-10-25 LAB — PREPARE PLATELET PHERESIS: Unit division: 0

## 2020-10-25 LAB — MAGNESIUM: Magnesium: 2 mg/dL (ref 1.7–2.4)

## 2020-10-25 LAB — HEMOGLOBIN A1C
Hgb A1c MFr Bld: 5.7 % — ABNORMAL HIGH (ref 4.8–5.6)
Mean Plasma Glucose: 117 mg/dL

## 2020-10-25 MED ORDER — HEPARIN SODIUM (PORCINE) 5000 UNIT/ML IJ SOLN
5000.0000 [IU] | Freq: Three times a day (TID) | INTRAMUSCULAR | Status: DC
  Administered 2020-10-25 – 2020-10-31 (×19): 5000 [IU] via SUBCUTANEOUS
  Filled 2020-10-25 (×18): qty 1

## 2020-10-25 MED ORDER — SODIUM CHLORIDE 0.9 % IV SOLN: INTRAVENOUS | Status: DC

## 2020-10-25 MED ORDER — ACETAMINOPHEN 325 MG PO TABS
650.0000 mg | ORAL_TABLET | Freq: Four times a day (QID) | ORAL | Status: DC
  Administered 2020-10-25 – 2020-10-27 (×7): 650 mg via ORAL
  Filled 2020-10-25 (×8): qty 2

## 2020-10-25 MED ORDER — SODIUM CHLORIDE 0.9 % IV BOLUS
500.0000 mL | Freq: Once | INTRAVENOUS | Status: AC
  Administered 2020-10-25: 500 mL via INTRAVENOUS

## 2020-10-25 MED ORDER — OXYCODONE HCL 5 MG PO TABS
5.0000 mg | ORAL_TABLET | ORAL | Status: DC | PRN
  Administered 2020-10-25 – 2020-10-27 (×7): 10 mg via ORAL
  Administered 2020-10-28: 5 mg via ORAL
  Filled 2020-10-25 (×2): qty 2
  Filled 2020-10-25 (×2): qty 1
  Filled 2020-10-25 (×5): qty 2

## 2020-10-25 MED ORDER — METHOCARBAMOL 500 MG PO TABS
500.0000 mg | ORAL_TABLET | Freq: Three times a day (TID) | ORAL | Status: DC
  Administered 2020-10-25 – 2020-11-01 (×23): 500 mg via ORAL
  Filled 2020-10-25 (×23): qty 1

## 2020-10-25 MED ORDER — MORPHINE SULFATE (PF) 2 MG/ML IV SOLN
2.0000 mg | INTRAVENOUS | Status: DC | PRN
  Administered 2020-10-31: 2 mg via INTRAVENOUS
  Filled 2020-10-25: qty 1

## 2020-10-25 NOTE — Progress Notes (Signed)
CVAD removed per protocol per MD order. Manual pressure applied for 10 mins. No bleeding or swelling noted. Instructed patient to remain in bed for thirty mins. Educated patient about S/S of infection and when to call MD; no heavy lifting or pressure on left side for 24 hours; keep dressing dry and intact for 24 hours. Pt verbalized comprehension.

## 2020-10-25 NOTE — Progress Notes (Signed)
BUN creat. Up from earlier lab result.  Barkley Boards, PA notified.

## 2020-10-25 NOTE — Progress Notes (Signed)
Progress Note  1 Day Post-Op  Subjective: Patient tells me "I am about as mad as a pregnant woman at 9 months. I'm hungry!" He reports he had significant pain overnight and was unable to sleep well due to pain. He wants line out of his neck as well.   Objective: Vital signs in last 24 hours: Temp:  [97.4 F (36.3 C)-98.1 F (36.7 C)] 98.1 F (36.7 C) (05/27 0539) Pulse Rate:  [39-113] 109 (05/27 0539) Resp:  [12-22] 18 (05/27 0539) BP: (102-130)/(55-91) 115/80 (05/27 0539) SpO2:  [92 %-100 %] 95 % (05/27 0539) Arterial Line BP: (127-140)/(52-66) 133/52 (05/26 1405) Last BM Date: 10/24/20  Intake/Output from previous day: 05/26 0701 - 05/27 0700 In: 2553.1 [P.O.:30; I.V.:1415; Blood:608; IV Piggyback:500.1] Out: 1170 [Urine:575; Drains:195; Blood:400] Intake/Output this shift: Total I/O In: -  Out: 25 [Stool:25]  PE: General: WD, chronically ill appearing male who is laying in bed in NAD Heart: regular, rate, and rhythm.  Lungs:  Respiratory effort nonlabored Abd: soft, appropriately ttp, ND, drain in RLQ with SS fluid, midline wound clean without active bleeding, stoma mildly dusky with SS fluid in ostomy appliance  MS: all 4 extremities are symmetrical with no cyanosis, clubbing, or edema. Psych: A&Ox3 with an appropriate affect.    Lab Results:  Recent Labs    10/24/20 1459 10/25/20 0047  WBC 6.1 9.4  HGB 9.1* 8.9*  HCT 29.4* 28.5*  PLT 77* 84*   BMET Recent Labs    10/24/20 0316 10/24/20 1016 10/24/20 1147 10/25/20 0047  NA 135   < > 138 137  K 4.1   < > 4.0 5.6*  CL 104  --   --  103  CO2 25  --   --  26  GLUCOSE 109*  --   --  144*  BUN 26*  --   --  30*  CREATININE 1.03  --   --  1.86*  CALCIUM 8.4*  --   --  8.8*   < > = values in this interval not displayed.   PT/INR No results for input(s): LABPROT, INR in the last 72 hours. CMP     Component Value Date/Time   NA 137 10/25/2020 0047   NA 139 01/29/2020 1548   K 5.6 (H) 10/25/2020  0047   CL 103 10/25/2020 0047   CO2 26 10/25/2020 0047   GLUCOSE 144 (H) 10/25/2020 0047   BUN 30 (H) 10/25/2020 0047   BUN 34 (H) 01/29/2020 1548   CREATININE 1.86 (H) 10/25/2020 0047   CALCIUM 8.8 (L) 10/25/2020 0047   PROT 6.0 (L) 10/23/2020 0048   PROT 7.3 01/29/2020 1548   ALBUMIN 2.3 (L) 10/23/2020 0048   ALBUMIN 3.9 01/29/2020 1548   AST 15 10/23/2020 0048   ALT 19 10/23/2020 0048   ALKPHOS 63 10/23/2020 0048   BILITOT 1.2 10/23/2020 0048   BILITOT 0.4 01/29/2020 1548   GFRNONAA 37 (L) 10/25/2020 0047   GFRAA 79 01/29/2020 1548   Lipase  No results found for: LIPASE     Studies/Results: DG Retrograde Pyelogram  Result Date: 10/24/2020 CLINICAL DATA:  75 year old male with colorectal cancer . EXAM: RETROGRADE PYELOGRAM COMPARISON:  CT chest, abdomen and pelvis 10/22/2020 FINDINGS: A total of 3 intraoperative saved images obtained during bilateral double-J ureteral stent placement are submitted for review. The images demonstrate a catheter in the bladder with contrast opacification of the bladder. Partial bilateral nephroureterograms. A ureteral stent is partially visualized on the left. No focal abnormality  of the visualized portions of either ureter. IMPRESSION: Ureteropyelogram with placement of bilateral ureteral stents. Electronically Signed   By: Jacqulynn Cadet M.D.   On: 10/24/2020 11:14   DG CHEST PORT 1 VIEW  Result Date: 10/24/2020 CLINICAL DATA:  Postop central line placement EXAM: PORTABLE CHEST 1 VIEW COMPARISON:  10/17/2020 FINDINGS: Right jugular catheter is been placed with the tip in the mid SVC. No pneumothorax Median sternotomy. Negative for heart failure. Underlying chronic lung disease. No acute infiltrate or effusion. IMPRESSION: Central line placement into the SVC.  No pneumothorax. Electronically Signed   By: Franchot Gallo M.D.   On: 10/24/2020 13:45    Anti-infectives: Anti-infectives (From admission, onward)   Start     Dose/Rate Route  Frequency Ordered Stop   10/24/20 0600  cefoTEtan (CEFOTAN) 2 g in sodium chloride 0.9 % 100 mL IVPB        2 g 200 mL/hr over 30 Minutes Intravenous To [facility] 10/23/20 2031 10/24/20 1025       Assessment/Plan A fib/flutter - per cards H/O MVR - echo is stable  COPD/tobacco abuse - smokes pipe Basal cell carcinoma x2 of LUE - currently undergoing radiation therapy DM - per primary H/O HTN ABL anemia - secondary to sigmoid colon mass Thrombocytopenia - unclear etiology. 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 Donne Hazel - POD#1 - start CLD today, ok to have cream and sugar for coffee - BID WTD to midline wound - drain with ss fluid - continue for now and trend OP - PT/OT - monitor stoma output, WOC following for colostomy teaching   FEN - CLD, IVF VTE -no LMWH given plts 84K and anemia  ID -cefotetan pre-op  LOS: 8 days    Norm Parcel, [facility] 10/25/2020, 9:13 AM Please see Amion for pager number during day hours 7:00am-4:30pm

## 2020-10-25 NOTE — Progress Notes (Signed)
PROGRESS NOTE    [name]  HUT:[id number] DOB: [birth year] DOA: 10/17/2020 PCP: Pcp, No   Brief Narrative:  75 years old male with PMH significant for bacterial endocarditis s/p mitral valve replacement in 2018, DVT, DM, HTN, dementia, tobacco use, COPD, atrial fibrillation, sarcoidosis, basal cell carcinoma who was transferred from echo lab to the ER with heart rate in 140s.  Patient denies any symptoms.  Patient states that he is unaware that his heart rate is elevated.  EKG shows atrial flutter with RVR.  Patient is also hypotensive.  He has not followed up with his regular doctor in several years.  Patient was seen by cardiologist and was started on digoxin and Cardizem p.o.  Apparently Cardizem IV drip dropped his blood pressure drastically.  Plan was to do TEE and DC cardioversion however over the weekend patient had guaiac positive stools and GI was consulted and patient had EGD and colonoscopy done on 10/21/2020 which showed a possible partially obstructing mass in the sigmoid colon.  Allergy confirms adenocarcinoma.  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 Benay Spice --Marvis Repress consulted Plan: --clear liquid diet, advance per surgery --pain management --drain management per surgery  AKI, not POA --developed after surgery --cont MIVF@75   A. fib/a flutter with RVR -Heart rate difficult to control in the  setting of soft blood pressure  -Due to lower GI bleed is not a candidate for cardioversion. -TSH wnl --on digoxin 0.125 mg daily, cardizem 120 mg daily, Toprol 50 mg daily Plan: --cont current regimen  History of mitral valve replacement and infective endocarditis  - His ejection fraction is 65 to 70%.  Normal left ventricular function and no regional wall motion abnormalities.  Chronic blood loss anemia Severe iron deficiency anemia  -This is secondary to partially obstructing sigmoid adenocarcinoma . -Received IV iron .  Anemia panel consistent with severe iron deficiency anemia -s/p 1u pRBC for Hemoglobin is 7.4  - Appreciate GI input colonoscopy showed likely malignant partially obstructing tumor in the sigmoid colon which was biopsied. - EGD was essentially normal. Plan: --Monitor Hgb and transfuse to keep >7 --cont iron supplement  COPD --cont daily bronchodilator  Current smoker -cessation advised.  He is not ready to give up smoking yet. --cont nicotine patch  basal cell carcinoma  --left hand covered with dressing.  Acute on chronic thrombocytopenia -with iron deficiency and guaiac positive stools with nonobstructing sigmoid mass with pending biopsy.  Patient has chronic thrombocytopenia which is worsened since going on anticoagulation.   --HIT panel neg Plan: --monitor plt  Hyperkalemia, resolved --s/p a dose of Lokelma  --monitor for now   Estimated body mass index is 20.54 kg/m as calculated from the following:   Height as of this encounter: 6' (1.829 m).   Weight as of this encounter: 68.7 kg.  DVT prophylaxis: SCD Code Status: Full code Family Communication:  disposition Plan:  Status is: Inpatient  Dispo: The patient is from:  Home              Anticipated d/c is to: Home              Patient currently is not medically stable to d/c.  Need to establish bowel function    Difficult to place patient No    Consultants:   Cardiology    GI  General surgery  Procedures: EGD/colonoscopy 5/23  Antimicrobials: None  Subjective:  Reported abdominal pain, but had already walked.  Tolerating liquid diet.  Urine output improving with IVF.   Objective: Vitals:   10/25/20 1224 10/25/20 1332 10/25/20 1533 10/25/20 1734  BP: 121/82 128/88 (!) 151/66 118/76  Pulse: (!) 119 (!) 122 (!) 122 (!) 129  Resp: 18 19 16 16   Temp: 98 F (36.7 C) (!) 97.3 F (36.3 C) 97.7 F (36.5 C) 97.8 F (36.6 C)  TempSrc: Oral Oral Oral Oral  SpO2: 98% 94% 94% 92%  Weight:      Height:        Intake/Output Summary (Last 24 hours) at 10/25/2020 1757 Last data filed at 10/25/2020 1300 Gross per 24 hour  Intake 1017.12 ml  Output 1395 ml  Net -377.88 ml   Filed Weights   10/18/20 0451 10/22/20 1550 10/24/20 0846  Weight: 81.2 kg 68.7 kg 68.7 kg    Examination:  Constitutional: NAD, AAOx3, brushing teeth HEENT: conjunctivae and lids normal, EOMI CV: No cyanosis.   RESP: normal respiratory effort, on RA GI: small amount of serosanguinous fluid in abdominal drain Extremities: No effusions, edema in BLE SKIN: warm, dry Neuro: II - XII grossly intact.   Foley present, hematuria   Data Reviewed: I have personally reviewed following labs and imaging studies  CBC: Recent Labs  Lab 10/22/20 0131 10/23/20 0048 10/24/20 0316 10/24/20 1016 10/24/20 1147 10/24/20 1459 10/25/20 0047  WBC 6.1 10.2 5.6  --   --  6.1 9.4  HGB 7.4* 8.2* 7.9* 8.2* 8.8* 9.1* 8.9*  HCT 24.2* 26.1* 25.0* 24.0* 26.0* 29.4* 28.5*  MCV 72.2* 73.7* 73.7*  --   --  77.8* 76.8*  PLT 72* 64* 64*  --   --  77* 84*   Basic Metabolic Panel: Recent Labs  Lab 10/21/20 1412 10/22/20 0131 10/23/20 0048 10/24/20 0316 10/24/20 1016 10/24/20 1147 10/25/20 0047 10/25/20 1529  NA  --  137 134* 135 138 138 137 135  K  --  4.2 3.9 4.1 4.1 4.0 5.6* 5.0  CL  --  104 103 104  --   --  103 103  CO2  --  27 24 25   --   --  26 21*  GLUCOSE  --  103* 108* 109*   --   --  144* 131*  BUN  --  25* 20 26*  --   --  30* 37*  CREATININE  --  1.25* 1.12 1.03  --   --  1.86* 2.57*  CALCIUM  --  8.7* 8.6* 8.4*  --   --  8.8* 8.7*  MG 1.9  --   --  2.0  --   --  2.0  --    GFR: Estimated Creatinine Clearance: 23.8 mL/min (A) (by C-G formula based on SCr of 2.57 mg/dL (H)). Liver Function Tests: Recent Labs  Lab 10/20/20 0802 10/21/20 0056 10/22/20 0131 10/23/20 0048  AST 16 50* 12* 15  ALT 23 18 18 19   ALKPHOS 58 71 57 63  BILITOT 1.0 1.2 0.7 1.2  PROT  6.4* 7.8 5.8* 6.0*  ALBUMIN 2.3* 3.0* 2.3* 2.3*   No results for input(s): LIPASE, AMYLASE in the last 168 hours. No results for input(s): AMMONIA in the last 168 hours. Coagulation Profile: No results for input(s): INR, PROTIME in the last 168 hours. Cardiac Enzymes: No results for input(s): CKTOTAL, CKMB, CKMBINDEX, TROPONINI in the last 168 hours. BNP (last 3 results) No results for input(s): PROBNP in the last 8760 hours. HbA1C: Recent Labs    10/23/20 2107  HGBA1C 5.7*   CBG: Recent Labs  Lab 10/23/20 2126 10/24/20 0615 10/24/20 0818 10/24/20 0840 10/24/20 1248  GLUCAP 130* 98 99 93 131*   Lipid Profile: No results for input(s): CHOL, HDL, LDLCALC, TRIG, CHOLHDL, LDLDIRECT in the last 72 hours. Thyroid Function Tests: No results for input(s): TSH, T4TOTAL, FREET4, T3FREE, THYROIDAB in the last 72 hours. Anemia Panel: Recent Labs    10/23/20 1801  VITAMINB12 360  FOLATE 28.1   Sepsis Labs: No results for input(s): PROCALCITON, LATICACIDVEN in the last 168 hours.  Recent Results (from the past 240 hour(s))  SARS CORONAVIRUS 2 (TAT 6-24 HRS) Nasopharyngeal Nasopharyngeal Swab     Status: None   Collection Time: 10/17/20  8:02 PM   Specimen: Nasopharyngeal Swab  Result Value Ref Range Status   SARS Coronavirus 2 NEGATIVE NEGATIVE Final    Comment: (NOTE) SARS-CoV-2 target nucleic acids are NOT DETECTED.  The SARS-CoV-2 RNA is generally detectable in upper and  lower respiratory specimens during the acute phase of infection. Negative results do not preclude SARS-CoV-2 infection, do not rule out co-infections with other pathogens, and should not be used as the sole basis for treatment or other patient management decisions. Negative results must be combined with clinical observations, patient history, and epidemiological information. The expected result is Negative.  Fact Sheet for Patients: SugarRoll.be  Fact Sheet for Healthcare Providers: https://www.woods-mathews.com/  This test is not yet approved or cleared by the [place] FDA and  has been authorized for detection and/or diagnosis of SARS-CoV-2 by FDA under an Emergency Use Authorization (EUA). This EUA will remain  in effect (meaning this test can be used) for the duration of the COVID-19 declaration under Se ction 564(b)(1) of the Act, 21 U.S.C. section 360bbb-3(b)(1), unless the authorization is terminated or revoked sooner.  Performed at [facility], [place] [address], [place], [place] [address]   MRSA PCR Screening     Status: None   Collection Time: 10/18/20  2:15 PM   Specimen: Nasopharyngeal  Result Value Ref Range Status   MRSA by PCR NEGATIVE NEGATIVE Final    Comment:        The GeneXpert MRSA Assay (FDA approved for NASAL specimens only), is one component of a comprehensive MRSA colonization surveillance program. It is not intended to diagnose MRSA infection nor to guide or monitor treatment for MRSA infections. Performed at [facility], [place] [address] [place], [place] [address]   Surgical pcr screen     Status: Abnormal   Collection Time: 10/24/20 12:35 AM   Specimen: Nasal Mucosa; Nasal Swab  Result Value Ref Range Status   MRSA, PCR NEGATIVE NEGATIVE Final   Staphylococcus aureus POSITIVE (A) NEGATIVE Final    Comment: (NOTE) The Xpert SA Assay (FDA approved for NASAL specimens in patients  36 years of age and older), is one component of a comprehensive surveillance program. It is not intended to diagnose infection nor to guide or monitor treatment. Performed at [facility]  Lab, [address] [place], [place] [address]          Radiology Studies: DG Retrograde Pyelogram  Result Date: 10/24/2020 CLINICAL DATA:  75 year old male with colorectal cancer . EXAM: RETROGRADE PYELOGRAM COMPARISON:  CT chest, abdomen and pelvis 10/22/2020 FINDINGS: A total of 3 intraoperative saved images obtained during bilateral double-J ureteral stent placement are submitted for review. The images demonstrate a catheter in the bladder with contrast opacification of the bladder. Partial bilateral nephroureterograms. A ureteral stent is partially visualized on the left. No focal abnormality of the visualized portions of either ureter. IMPRESSION: Ureteropyelogram with placement of bilateral ureteral stents. Electronically Signed   By: Jacqulynn Cadet M.D.   On: 10/24/2020 11:14   DG CHEST PORT 1 VIEW  Result Date: 10/24/2020 CLINICAL DATA:  Postop central line placement EXAM: PORTABLE CHEST 1 VIEW COMPARISON:  10/17/2020 FINDINGS: Right jugular catheter is been placed with the tip in the mid SVC. No pneumothorax Median sternotomy. Negative for heart failure. Underlying chronic lung disease. No acute infiltrate or effusion. IMPRESSION: Central line placement into the SVC.  No pneumothorax. Electronically Signed   By: Franchot Gallo M.D.   On: 10/24/2020 13:45        Scheduled Meds: . sodium chloride   Intravenous Once  . sodium chloride   Intravenous Once  . acetaminophen  650 mg Oral Q6H  . Chlorhexidine Gluconate Cloth  6 each Topical Q0600  . digoxin  0.125 mg Oral Daily  . diltiazem  120 mg Oral QHS  . heparin injection (subcutaneous)  5,000 Units Subcutaneous Q8H  . iron polysaccharides  150 mg Oral Daily  . methocarbamol  500 mg Oral TID  . metoprolol succinate  50 mg Oral Daily   . mupirocin ointment  1 application Nasal BID  . nicotine  14 mg Transdermal Daily  . umeclidinium bromide  1 puff Inhalation Daily   Continuous Infusions: . sodium chloride 75 mL/hr at 10/25/20 1057     LOS: 8 days   Enzo Bi, MD  10/25/2020, 5:57 PM

## 2020-10-25 NOTE — Progress Notes (Signed)
Physical Therapy Re-evaluation Patient Details Name: [name] MRN: [id number] DOB: [birth year] Today's Date: 10/25/2020    History of Present Illness Pt is 75 y.o. male transferred from echo lab to ER on 5/19 when noted to have HR in 140s, with rhythm of atrial flutter and asymptomatic. Chest Abdomen Pelvis CT shows large colonic mass in the sigmoid colon with pelvic lymphadenopathy and small pulmonary nodules indicative of metastatic disease, potential pneumonia of L lower lobe, and mild paraseptal emphysema. Pt is now s/p sigmoid colectomy with end colostomy and cystoscopy with bil ureteral stents on 10/25/20. 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    PT Comments    Since initial evaluation , pt had sigmoid colectomy with end colostomy.  This did lead to a decrease in mobility - added goals but otherwise plan of care remained appropriate.  Pt was still able to ambulate 28' with IV pole but required min guard and cues for transfer techniques.  Also, required cues for safety - mild impulsivity with lines/leads.  Pt resides at home and has 24 hr support.      Follow Up Recommendations  No PT follow up;Supervision for mobility/OOB     Equipment Recommendations  None recommended by PT    Recommendations for Other Services       Precautions / Restrictions Precautions Precautions: Fall    Mobility  Bed Mobility Overal bed mobility: Needs Assistance Bed Mobility: Supine to Sit;Sit to Supine     Supine to sit: Min guard;HOB elevated Sit to supine: Min guard;HOB elevated   General bed mobility comments: Increased time, cued for log roll technique, min guard for safety    Transfers Overall transfer level: Needs assistance Equipment used: None Transfers: Sit to/from Stand Sit to Stand: Min guard             Ambulation/Gait Ambulation/Gait assistance: Counsellor (Feet): 60 Feet Assistive device: IV Pole Gait Pattern/deviations: Step-to pattern;Trunk flexed     General Gait Details: Trunk flexed due to pain, distance limited due to pain, cues for safety with lines/leads   Stairs             Wheelchair Mobility    Modified Rankin (Stroke Patients Only)       Balance Overall balance assessment: Needs assistance Sitting-balance support: No upper extremity supported Sitting balance-Leahy Scale: Good     Standing balance support: No upper extremity supported Standing balance-Leahy Scale: Fair                              Cognition Arousal/Alertness: Awake/alert Behavior During Therapy: Impulsive Overall Cognitive Status: Within Functional Limits for tasks assessed                                 General Comments: Pt with hx of dementia - mild decrease in safety awareness and repeats self at times      Exercises      General Comments General comments (skin integrity, edema, etc.): VSS on RA      Pertinent Vitals/Pain Pain Assessment: Faces Faces Pain Scale: Hurts even more Pain Location: abdomen with movement Pain Descriptors / Indicators: Grimacing;Discomfort Pain Intervention(s): Limited activity within patient's tolerance;Monitored during session    Home Living  Prior Function            PT Goals (current goals can now be found in the care plan section) Acute Rehab PT Goals Patient Stated Goal: return home PT Goal Formulation: With patient Time For Goal Achievement: 11/08/20 Potential to Achieve Goals: Good Additional Goals Additional Goal #1: Pt will score >19/24 on DGI to indicate low fall risk Progress towards PT goals: Progressing toward goals (Did add goals due to changes post op)    Frequency    Min 3X/week      PT Plan Frequency needs to be updated     Co-evaluation              AM-PAC PT "6 Clicks" Mobility   Outcome Measure  Help needed turning from your back to your side while in a flat bed without using bedrails?: A Little Help needed moving from lying on your back to sitting on the side of a flat bed without using bedrails?: A Little Help needed moving to and from a bed to a chair (including a wheelchair)?: A Little Help needed standing up from a chair using your arms (e.g., wheelchair or bedside chair)?: A Little Help needed to walk in hospital room?: A Little Help needed climbing 3-5 steps with a railing? : A Little 6 Click Score: 18    End of Session   Activity Tolerance: Patient tolerated treatment well Patient left: in bed;with call bell/phone within reach;with bed alarm set Nurse Communication: Mobility status PT Visit Diagnosis: Other abnormalities of gait and mobility (R26.89)     Time: 6720-9470 PT Time Calculation (min) (ACUTE ONLY): 18 min  Charges:     Milbert Coulter, PT Acute Rehab Services Pager [phone] [facility] [place] 10/25/2020, 2:07 PM

## 2020-10-25 NOTE — Consult Note (Signed)
[place] Nurse ostomy follow up Patient receiving care in [facility] 6N15. Stoma type/location: LUQ colostomy Stomal assessment/size: 1.5 inches, round, dark red, moist, sutures intact Peristomal assessment: intact Treatment options for stomal/peristomal skin: barrier ring Output: 11ml of serosanginous drainage in existing pouch  Ostomy pouching: 2pc. 2 and 3/4 inches pouching system. Order and keep at the bedside ostomy pouch, Kellie Simmering #649 and skin barrier, Lawson#2; also, barrier rings, Kellie Simmering [phone]. Education provided:  Today the patient emptied the existing pouch and cleaned the tail after emptying. With some assistance he removed the existing pouching system. He cut the opening for the new skin barrier. Snapped on the new pouch to the skin barrier he placed. He observed how I measured the stoma and stretched the barrier ring, and how I cleaned around the stoma. I could not locate the [facility] education folder that was given to him last week, so I have provided another and he has agreed to review the contents over today and the weekend. Enrolled patient in [facility] Discharge program: No  Val Riles, RN, MSN, [facility], CNS-BC, pager [phone]

## 2020-10-25 NOTE — Progress Notes (Addendum)
HEMATOLOGY-ONCOLOGY PROGRESS NOTE  SUBJECTIVE: Resting quietly in bed today.  Reports some abdominal discomfort.  Following clear liquids.  States that he is try to take a nap.  I inquired about the plan for his left wrist basal cell carcinoma, and he states that "I am not worried about that right now."  PHYSICAL EXAMINATION:  Vitals:   10/24/20 2133 10/25/20 0539  BP: 119/83 115/80  Pulse: 100 (!) 109  Resp: 17 18  Temp: 97.6 F (36.4 C) 98.1 F (36.7 C)  SpO2: 98% 95%   Filed Weights   10/18/20 0451 10/22/20 1550 10/24/20 0846  Weight: 81.2 kg 68.7 kg 68.7 kg    Intake/Output from previous day: 05/26 0701 - 05/27 0700 In: 2553.1 [P.O.:30; I.V.:1415; Blood:608; IV Piggyback:500.1] Out: 1170 [Urine:575; Drains:195; Blood:400]  GENERAL:alert, no distress and comfortable SKIN: Ulcerated skin lesion on the left wrist LUNGS: Diminished breath sound HEART: Irregular, no lower extremity edema ABDOMEN: Abdomen soft, tenderness with palpation, midline incision clean  NEURO: alert & oriented x 3 with fluent speech, no focal motor/sensory deficits  LABORATORY DATA:  I have reviewed the data as listed CMP Latest Ref Rng & Units 10/25/2020 10/24/2020 10/24/2020  Glucose 70 - 99 mg/dL 144(H) - -  BUN 8 - 23 mg/dL 30(H) - -  Creatinine 0.61 - 1.24 mg/dL 1.86(H) - -  Sodium 135 - 145 mmol/L 137 138 138  Potassium 3.5 - 5.1 mmol/L 5.6(H) 4.0 4.1  Chloride 98 - 111 mmol/L 103 - -  CO2 22 - 32 mmol/L 26 - -  Calcium 8.9 - 10.3 mg/dL 8.8(L) - -  Total Protein 6.5 - 8.1 g/dL - - -  Total Bilirubin 0.3 - 1.2 mg/dL - - -  Alkaline Phos 38 - 126 U/L - - -  AST 15 - 41 U/L - - -  ALT 0 - 44 U/L - - -    Lab Results  Component Value Date   WBC 9.4 10/25/2020   HGB 8.9 (L) 10/25/2020   HCT 28.5 (L) 10/25/2020   MCV 76.8 (L) 10/25/2020   PLT 84 (L) 10/25/2020    DG Chest 2 View  Result Date: 10/17/2020 CLINICAL DATA:  Chest pain, shortness of breath, extreme fatigue EXAM: CHEST - 2  VIEW COMPARISON:  None. FINDINGS: Cardiomediastinal silhouette and pulmonary vasculature are within normal limits. Median sternotomy changes are seen. The third wire is fractured. Prosthetic valve is seen. Lungs are hyperexpanded, but otherwise clear clear. IMPRESSION: No acute cardiopulmonary process. Electronically Signed   By: Miachel Roux M.D.   On: 10/17/2020 15:46   DG Retrograde Pyelogram  Result Date: 10/24/2020 CLINICAL DATA:  75 year old male with colorectal cancer . EXAM: RETROGRADE PYELOGRAM COMPARISON:  CT chest, abdomen and pelvis 10/22/2020 FINDINGS: A total of 3 intraoperative saved images obtained during bilateral double-J ureteral stent placement are submitted for review. The images demonstrate a catheter in the bladder with contrast opacification of the bladder. Partial bilateral nephroureterograms. A ureteral stent is partially visualized on the left. No focal abnormality of the visualized portions of either ureter. IMPRESSION: Ureteropyelogram with placement of bilateral ureteral stents. Electronically Signed   By: Jacqulynn Cadet M.D.   On: 10/24/2020 11:14   CT CHEST ABDOMEN PELVIS W CONTRAST  Result Date: 10/22/2020 CLINICAL DATA:  75 year old male with history of colorectal cancer. Staging examination. EXAM: CT CHEST, ABDOMEN, AND PELVIS WITH CONTRAST TECHNIQUE: Multidetector CT imaging of the chest, abdomen and pelvis was performed following the standard protocol during bolus administration of intravenous  contrast. CONTRAST:  141mL OMNIPAQUE IOHEXOL 300 MG/ML  SOLN COMPARISON:  No priors. FINDINGS: CT CHEST FINDINGS Cardiovascular: Heart size is normal. There is no significant pericardial fluid, thickening or pericardial calcification. Aortic atherosclerosis. No definite coronary artery calcifications. Status post median sternotomy for mitral valve replacement. Mediastinum/Nodes: No pathologically enlarged mediastinal or hilar lymph nodes. Esophagus is unremarkable in appearance.  No axillary lymphadenopathy. Lungs/Pleura: Rounded area of airspace consolidation in the posterior aspect of the left lower lobe (axial image 125 of series 5), likely a focus of infection. Small pulmonary nodules are noted measuring 6 mm in the inferior aspect of the lingula (axial image 101 of series 5), 5 mm in the periphery of the left lower lobe (axial image 72 of series 5), and 5 mm in the posterior aspect of the right lower lobe (axial image 94 of series 5). Areas of linear scarring or atelectasis are noted in the upper lobes of the lungs bilaterally. Mild paraseptal emphysema. Musculoskeletal: Median sternotomy wires. There are no aggressive appearing lytic or blastic lesions noted in the visualized portions of the skeleton. CT ABDOMEN PELVIS FINDINGS Hepatobiliary: Multiple subcentimeter low-attenuation lesions scattered throughout the hepatic parenchyma, too small to characterize, but statistically likely to represent cysts. The larger of the low-attenuation hepatic lesions are compatible with simple cysts, measuring up to 11 mm in the periphery of segment 4A. No other suspicious hepatic lesions. No intra or extrahepatic biliary ductal dilatation. Gallbladder is normal in appearance. Pancreas: No pancreatic mass. No pancreatic ductal dilatation. No pancreatic or peripancreatic fluid collections or inflammatory changes. Spleen: Unremarkable. Adrenals/Urinary Tract: 1.5 cm low-attenuation lesion in the posterior aspect of the interpolar region of the left kidney, compatible with a simple cyst. Right kidney and bilateral adrenal glands are normal in appearance. No hydroureteronephrosis. Urinary bladder is normal in appearance. Stomach/Bowel: The appearance of the stomach is normal. There is no pathologic dilatation of small bowel or colon. Mass-like area of mural thickening in the sigmoid colon estimated to measure approximately 10.4 x 6.3 x 6.3 cm (axial image 108 of series 3 and coronal image 55 of series  6). Focal direct extension of malignant appearing soft tissue posteriorly from the lesion (axial image 106 of series 3) into the sigmoid mesocolon. The appendix is not confidently identified and may be surgically absent. Regardless, there are no inflammatory changes noted adjacent to the cecum to suggest the presence of an acute appendicitis at this time. Vascular/Lymphatic: Aortic atherosclerosis, without evidence of aneurysm or dissection in the abdominal or pelvic vasculature. Enlarged right external iliac lymph node measuring 1.5 cm in short axis (axial image 106 of series 3). Several other prominent but nonenlarged lymph nodes are noted in the pelvis, largest of which measures up to 6 mm in the low left anterior pelvis (axial image 113 of series 3). Enlarged right common iliac lymph node (axial image 97 of series 3) measuring 1.6 cm in short axis. Reproductive: Prostate gland and seminal vesicles are unremarkable in appearance. Other: No significant volume of ascites.  No pneumoperitoneum. Musculoskeletal: There are no aggressive appearing lytic or blastic lesions noted in the visualized portions of the skeleton. IMPRESSION: 1. Large colonic mass estimated to measure approximately 10.4 x 6.3 x 6.3 cm in the sigmoid colon with pelvic lymphadenopathy indicative of metastatic disease, as above. Small pulmonary nodules are also noted, which could conceivably represent metastatic lesions as well. Attention on follow-up imaging is recommended to assess for the stability of these nodules. 2. Airspace consolidation in the left lower  lobe concerning for potential pneumonia. 3. Aortic atherosclerosis. 4. Mild paraseptal emphysema. 5. Additional incidental findings, as above. Electronically Signed   By: Vinnie Langton M.D.   On: 10/22/2020 18:54   DG CHEST PORT 1 VIEW  Result Date: 10/24/2020 CLINICAL DATA:  Postop central line placement EXAM: PORTABLE CHEST 1 VIEW COMPARISON:  10/17/2020 FINDINGS: Right jugular  catheter is been placed with the tip in the mid SVC. No pneumothorax Median sternotomy. Negative for heart failure. Underlying chronic lung disease. No acute infiltrate or effusion. IMPRESSION: Central line placement into the SVC.  No pneumothorax. Electronically Signed   By: Franchot Gallo M.D.   On: 10/24/2020 13:45   ECHOCARDIOGRAM COMPLETE  Result Date: 10/19/2020    ECHOCARDIOGRAM REPORT   Patient Name:   [name] Date of Exam: 10/19/2020 Medical Rec #:  [id number]        Height:       72.0 in Accession #:    [id number]       Weight:       179.0 lb Date of Birth:  05/29/1946        BSA:          2.032 m Patient Age:    75 years         BP:           93/61 mmHg Patient Gender: M                HR:           133 bpm. Exam Location:  Inpatient Procedure: 2D Echo, 3D Echo, Cardiac Doppler and Color Doppler Indications:    Abnormal EKG  History:        Patient has prior history of Echocardiogram examinations, most                 recent 07/24/2016. COPD, Signs/Symptoms:Shortness of Breath; Risk                 Factors:Current Smoker and Hypertension. History of                 endocarditis.                  Mitral Valve: 31 mm Medtronic Mosaic bioprosthetic valve valve                 is present in the mitral position. Procedure Date: 07/14/2016.  Sonographer:    Darlina Sicilian RDCS Referring Phys: [phone] BYRON Slinger  Sonographer Comments: Global longitudinal strain was attempted. IMPRESSIONS  1. Left ventricular ejection fraction, by estimation, is 65 to 70%. The left ventricle has normal function. The left ventricle has no regional wall motion abnormalities. Left ventricular diastolic function could not be evaluated.  2. Right ventricular systolic function is normal. The right ventricular size is mildly enlarged. There is normal pulmonary artery systolic pressure. The estimated right ventricular systolic pressure is 71.2 mmHg.  3. 31 mm mosaic prosthetic valve with MG 7 mmHG @ 130 bpm. EOA 2.32 cm2. DI  1.6. Normal prosthesis with higher gradients due to tachycardia. The mitral valve has been repaired/replaced. No evidence of mitral valve regurgitation. There is a 31 mm Medtronic Mosaic bioprosthetic valve present in the mitral position. Procedure Date: 07/14/2016.  4. The aortic valve is tricuspid. Aortic valve regurgitation is not visualized. No aortic stenosis is present.  5. The inferior vena cava is normal in size with greater than 50% respiratory variability, suggesting right atrial pressure of 3  mmHg. FINDINGS  Left Ventricle: Left ventricular ejection fraction, by estimation, is 65 to 70%. The left ventricle has normal function. The left ventricle has no regional wall motion abnormalities. The left ventricular internal cavity size was normal in size. There is  no left ventricular hypertrophy. Left ventricular diastolic function could not be evaluated due to mitral valve replacement. Left ventricular diastolic function could not be evaluated. Right Ventricle: The right ventricular size is mildly enlarged. No increase in right ventricular wall thickness. Right ventricular systolic function is normal. There is normal pulmonary artery systolic pressure. The tricuspid regurgitant velocity is 2.46  m/s, and with an assumed right atrial pressure of 3 mmHg, the estimated right ventricular systolic pressure is 81.2 mmHg. Left Atrium: Left atrial size was normal in size. Right Atrium: Right atrial size was normal in size. Pericardium: Trivial pericardial effusion is present. Mitral Valve: 31 mm mosaic prosthetic valve with MG 7 mmHG @ 130 bpm. EOA 2.32 cm2. DI 1.6. Normal prosthesis with higher gradients due to tachycardia. The mitral valve has been repaired/replaced. No evidence of mitral valve regurgitation. There is a 31 mm Medtronic Mosaic bioprosthetic valve present in the mitral position. Procedure Date: 07/14/2016. MV peak gradient, 12.2 mmHg. The mean mitral valve gradient is 7.0 mmHg. Tricuspid Valve: The  tricuspid valve is grossly normal. Tricuspid valve regurgitation is mild . No evidence of tricuspid stenosis. Aortic Valve: The aortic valve is tricuspid. Aortic valve regurgitation is not visualized. No aortic stenosis is present. Pulmonic Valve: The pulmonic valve was grossly normal. Pulmonic valve regurgitation is not visualized. No evidence of pulmonic stenosis. Aorta: The aortic root and ascending aorta are structurally normal, with no evidence of dilitation. Venous: The inferior vena cava is normal in size with greater than 50% respiratory variability, suggesting right atrial pressure of 3 mmHg. IAS/Shunts: The atrial septum is grossly normal.  LEFT VENTRICLE PLAX 2D LVIDd:         3.50 cm LVIDs:         2.40 cm LV PW:         0.90 cm LV IVS:        0.90 cm LVOT diam:     2.20 cm LV SV:         49 LV SV Index:   24 LVOT Area:     3.80 cm  RIGHT VENTRICLE RV S prime:     9.03 cm/s TAPSE (M-mode): 1.3 cm LEFT ATRIUM             Index       RIGHT ATRIUM           Index LA diam:        2.80 cm 1.38 cm/m  RA Area:     16.00 cm LA Vol (A2C):   16.8 ml 8.27 ml/m  RA Volume:   44.60 ml  21.94 ml/m LA Vol (A4C):   32.1 ml 15.79 ml/m LA Biplane Vol: 25.2 ml 12.40 ml/m  AORTIC VALVE LVOT Vmax:   98.40 cm/s LVOT Vmean:  67.900 cm/s LVOT VTI:    0.128 m  AORTA Ao Root diam: 3.70 cm Ao Asc diam:  3.30 cm MITRAL VALVE             TRICUSPID VALVE MV Area VTI:  2.32 cm   TR Peak grad:   24.2 mmHg MV Peak grad: 12.2 mmHg  TR Vmax:        246.00 cm/s MV Mean grad: 7.0 mmHg MV Vmax:  1.75 m/s   SHUNTS MV Vmean:     122.0 cm/s Systemic VTI:  0.13 m MV VTI:       0.21 m     Systemic Diam: 2.20 cm Eleonore Chiquito MD Electronically signed by Eleonore Chiquito MD Signature Date/Time: 10/19/2020/1:35:36 PM    Final     ASSESSMENT AND PLAN: 1.  Sigmoid colon adenocarcinoma -Colonoscopy 10/21/2020- partially obstructing mass found in the sigmoid colon consistent with adenocarcinoma. -CEA on 10/22/2020 was 3.9 -CTs  10/22/2020-colonic mass with pelvic lymphadenopathy including right common iliac and external iliac nodes, small pulmonary nodules -10/24/2020-sigmoid colectomy, end colostomy, tumor stuck to pelvic sidewall with rind of tissue remaining at completion of surgery 2.  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 3.  Thrombocytopenia 4.  Atrial flutter 5.  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  COPD 7.  Hypertension 8.  Diabetes mellitus 9.  Dementia 10.  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 Martin Majestic who is going to check with some Mohs specialist to see if disease is resectable, they were holding on radiation appointments until this could be explored. -Established care with the wound center on 09/13/2019  Mr. Navarez underwent surgery for his partially obstructing colon mass on 10/24/2020.  He tolerated his surgery well.  He continues to have postop pain which is expected.  Ongoing management per general surgery.  We will follow-up on the pathology report once it is available.  CBC from today has been reviewed and hemoglobin remained stable.  Platelets are up to 84,000 today.  He has no active bleeding.  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  The patient has a left wrist ulcerating lesion consistent with basal cell carcinoma.  He has been seen by radiation oncology in the past and some discussion was had regarding a 6-week course of radiation to this area.  However, it looks like they wanted him to be evaluated for Mohs surgery before deciding about proceeding with radiation.  Left wrist ulcerated mass will need further evaluation and possible surgery.  Recommendations: 1.  We will follow-up on pathology results  when they are available. 2.  Monitor CBC closely. 3.  Will need evaluation of his left wrist basal cell carcinoma and Mohs surgery versus radiation to this area. 4.  Postoperative care and management of renal function for medicine and surgery  Please call medical oncology over the weekend for questions.  We will follow-up with him on Tuesday if he remains in the hospital.  Outpatient follow-up will be scheduled at the cancer center.   LOS: 8 days   Mikey Bussing, DNP, AGPCNP-BC, AOCNP 10/25/20 Mr. Oyama was interviewed and examined.  He underwent a sigmoid tumor resection and end colostomy yesterday.  I reviewed the operative note.  He appears to have locally advanced colon cancer and potentially metastatic disease involving lung nodules.  We will follow-up on the pathology report and discussed systemic treatment options. The platelet count and hemoglobin are stable today. He appears to have an advanced basal cell carcinoma at the left wrist.  I will check on Mr. Ellenburg next week.  I was present for greater than 50% of today's visit.  I performed medical decision making.

## 2020-10-25 NOTE — Progress Notes (Signed)
500 cc IVF bolus given.

## 2020-10-25 NOTE — Anesthesia Postprocedure Evaluation (Signed)
Anesthesia Post Note  Patient: [name]  Procedure(s) Performed: OPEN PARTIAL COLECTOMY WTH COLOSTOMY (N/A ) CYSTOSCOPY WITH RETROGRADE PYELOGRAM/URETERAL STENT PLACEMENT (N/A ) URETHRAL DILATATION     Patient location during evaluation: PACU Anesthesia Type: General Level of consciousness: awake and alert Pain management: pain level controlled Vital Signs Assessment: post-procedure vital signs reviewed and stable Respiratory status: spontaneous breathing, nonlabored ventilation, respiratory function stable and patient connected to nasal cannula oxygen Cardiovascular status: blood pressure returned to baseline, stable and bradycardic Postop Assessment: no apparent nausea or vomiting Anesthetic complications: no   No complications documented.  Last Vitals:  Vitals:   10/25/20 1533 10/25/20 1734  BP: (!) 151/66 118/76  Pulse: (!) 122 (!) 129  Resp: 16 16  Temp: 36.5 C 36.6 C  SpO2: 94% 92%    Last Pain:  Vitals:   10/25/20 1734  TempSrc: Oral  PainSc:                  Audry Pili

## 2020-10-25 NOTE — Progress Notes (Addendum)
   10/25/20 1224  Assess: MEWS Score  Temp 98 F (36.7 C)  BP 121/82  Pulse Rate (!) 119  Resp 18  SpO2 98 %  O2 Device Room Air  Patient Activity (if Appropriate) In bed  Assess: MEWS Score  MEWS Temp 0  MEWS Systolic 0  MEWS Pulse 2  MEWS RR 0  MEWS LOC 0  MEWS Score 2  MEWS Score Color Yellow  Treat  Pain Scale 0-10  Pain Score 5  Pain Location Abdomen  Pain Orientation Mid  Take Vital Signs  Increase Vital Sign Frequency  Yellow: Q 2hr X 2 then Q 4hr X 2, if remains yellow, continue Q 4hrs  Escalate  MEWS: Escalate Yellow: discuss with charge nurse/RN and consider discussing with provider and RRT  Notify: Charge Nurse/RN  Name of Charge Nurse/RN Notified Berton Lan. Rn  Date Charge Nurse/RN Notified 10/25/20  Time Charge Nurse/RN Notified 1224  Document  Patient Outcome Stabilized after interventions  Progress note created (see row info) Yes   Notified Dr. Billie Ruddy to see if wanted to give bolus or increase IVF, no new orders.

## 2020-10-26 DIAGNOSIS — K6389 Other specified diseases of intestine: Secondary | ICD-10-CM | POA: Diagnosis not present

## 2020-10-26 DIAGNOSIS — I4891 Unspecified atrial fibrillation: Secondary | ICD-10-CM | POA: Diagnosis not present

## 2020-10-26 LAB — TYPE AND SCREEN
ABO/RH(D): AB POS
Antibody Screen: NEGATIVE
Unit division: 0
Unit division: 0
Unit division: 0

## 2020-10-26 LAB — MAGNESIUM: Magnesium: 1.9 mg/dL (ref 1.7–2.4)

## 2020-10-26 LAB — BPAM RBC
Blood Product Expiration Date: 202206192359
Blood Product Expiration Date: 202206192359
Blood Product Expiration Date: 202206272359
ISSUE DATE / TIME: 202205242148
ISSUE DATE / TIME: 202205260901
ISSUE DATE / TIME: 202205260901
Unit Type and Rh: 6200
Unit Type and Rh: 6200
Unit Type and Rh: 8400

## 2020-10-26 LAB — CBC
HCT: 26.4 % — ABNORMAL LOW (ref 39.0–52.0)
Hemoglobin: 8.1 g/dL — ABNORMAL LOW (ref 13.0–17.0)
MCH: 24.1 pg — ABNORMAL LOW (ref 26.0–34.0)
MCHC: 30.7 g/dL (ref 30.0–36.0)
MCV: 78.6 fL — ABNORMAL LOW (ref 80.0–100.0)
Platelets: 75 10*3/uL — ABNORMAL LOW (ref 150–400)
RBC: 3.36 MIL/uL — ABNORMAL LOW (ref 4.22–5.81)
RDW: 20.5 % — ABNORMAL HIGH (ref 11.5–15.5)
WBC: 13.5 10*3/uL — ABNORMAL HIGH (ref 4.0–10.5)
nRBC: 0 % (ref 0.0–0.2)

## 2020-10-26 LAB — BASIC METABOLIC PANEL
Anion gap: 11 (ref 5–15)
BUN: 36 mg/dL — ABNORMAL HIGH (ref 8–23)
CO2: 20 mmol/L — ABNORMAL LOW (ref 22–32)
Calcium: 8.5 mg/dL — ABNORMAL LOW (ref 8.9–10.3)
Chloride: 103 mmol/L (ref 98–111)
Creatinine, Ser: 2.44 mg/dL — ABNORMAL HIGH (ref 0.61–1.24)
GFR, Estimated: 27 mL/min — ABNORMAL LOW (ref >60)
Glucose, Bld: 113 mg/dL — ABNORMAL HIGH (ref 70–99)
Potassium: 5.3 mmol/L — ABNORMAL HIGH (ref 3.5–5.1)
Sodium: 134 mmol/L — ABNORMAL LOW (ref 135–145)

## 2020-10-26 MED ORDER — SODIUM ZIRCONIUM CYCLOSILICATE 5 G PO PACK
5.0000 g | PACK | Freq: Every day | ORAL | Status: DC
  Administered 2020-10-26 – 2020-10-27 (×2): 5 g via ORAL
  Filled 2020-10-26 (×2): qty 1

## 2020-10-26 NOTE — Progress Notes (Signed)
PROGRESS NOTE    [name]  XVQ:[id number] DOB: 22-Feb-1946 DOA: 10/17/2020 PCP: Pcp, No   Brief Narrative:  75 years old male with PMH significant for bacterial endocarditis s/p mitral valve replacement in 2018, DVT, DM, HTN, dementia, tobacco use, COPD, atrial fibrillation, sarcoidosis, basal cell carcinoma who was transferred from echo lab to the ER with heart rate in 140s.  Patient denies any symptoms.  Patient states that he is unaware that his heart rate is elevated.  EKG shows atrial flutter with RVR.  Patient is also hypotensive.  He has not followed up with his regular doctor in several years.  Patient was seen by cardiologist and was started on digoxin and Cardizem p.o.  Apparently Cardizem IV drip dropped his blood pressure drastically.  Plan was to do TEE and DC cardioversion however over the weekend patient had guaiac positive stools and GI was consulted and patient had EGD and colonoscopy done on 10/21/2020 which showed a possible partially obstructing mass in the sigmoid colon.  Allergy confirms adenocarcinoma.  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 Benay Spice --Marvis Repress consulted Plan: --cont clear liquid diet, advance per surgery --pain management --drain management per surgery  AKI, not POA --developed after surgery --cont MIVF@75  --cont foley for now  A. fib/a flutter with RVR -Heart rate  difficult to control in the setting of soft blood pressure  -Due to lower GI bleed is not a candidate for cardioversion. -TSH wnl --on digoxin 0.125 mg daily, cardizem 120 mg daily, Toprol 50 mg daily Plan: --cont current regimen  History of mitral valve replacement and infective endocarditis  - His ejection fraction is 65 to 70%.  Normal left ventricular function and no regional wall motion abnormalities.  Chronic blood loss anemia Severe iron deficiency anemia  -This is secondary to partially obstructing sigmoid adenocarcinoma . -Received IV iron .  Anemia panel consistent with severe iron deficiency anemia -s/p 1u pRBC for Hemoglobin is 7.4  - Appreciate GI input colonoscopy showed likely malignant partially obstructing tumor in the sigmoid colon which was biopsied. - EGD was essentially normal. Plan: --Monitor Hgb and transfuse to keep Hgb >7 --cont iron supplement  COPD --cont daily bronchodilator  Current smoker -cessation advised.  He is not ready to give up smoking yet. --cont nicotine patch  basal cell carcinoma  --left hand covered with dressing.  Acute on chronic thrombocytopenia -with iron deficiency and guaiac positive stools with nonobstructing sigmoid mass with pending biopsy.  Patient has chronic thrombocytopenia which is worsened since going on anticoagulation.   --HIT panel neg Plan: --monitor plt  Hyperkalemia --start Lokelma daily   Estimated body mass index is 20.54 kg/m as calculated from the following:   Height as of this encounter: 6' (1.829 m).   Weight as of this encounter: 68.7 kg.  DVT prophylaxis: SCD Code Status: Full code Family Communication: son updated at bedside today  disposition Plan:  Status is: Inpatient  Dispo:  The patient is from: Home              Anticipated d/c is to: Home              Patient currently is not medically stable to d/c.  Need to establish bowel function    Difficult to place patient No     Consultants:   Cardiology   GI  General surgery  Procedures: EGD/colonoscopy 5/23  Antimicrobials: None  Subjective:  Pt reported coughing up bloody sputum.  No dyspnea.  Tolerating clear liquid diet.     Objective: Vitals:   10/26/20 0521 10/26/20 0759 10/26/20 1152 10/26/20 1416  BP: 109/68   133/81  Pulse: (!) 121 (!) 122 (!) 113 (!) 118  Resp: 18 16 18 18   Temp: 98 F (36.7 C)  97.8 F (36.6 C) 98.6 F (37 C)  TempSrc: Oral  Oral   SpO2: 92% 91% 93% 96%  Weight:      Height:        Intake/Output Summary (Last 24 hours) at 10/26/2020 1623 Last data filed at 10/26/2020 1500 Gross per 24 hour  Intake 695.51 ml  Output 1110 ml  Net -414.49 ml   Filed Weights   10/18/20 0451 10/22/20 1550 10/24/20 0846  Weight: 81.2 kg 68.7 kg 68.7 kg    Examination:  Constitutional: NAD, AAOx3 HEENT: conjunctivae and lids normal, EOMI CV: No cyanosis.   RESP: normal respiratory effort, on RA, cup half filled with thin sputum with blood mixed in GI: abdominal drain with small amount of ss fluid.   Extremities: No effusions, edema in BLE SKIN: warm, dry Neuro: II - XII grossly intact.   Foley present, urine clearing up    Data Reviewed: I have personally reviewed following labs and imaging studies  CBC: Recent Labs  Lab 10/23/20 0048 10/24/20 0316 10/24/20 1016 10/24/20 1147 10/24/20 1459 10/25/20 0047 10/26/20 0234  WBC 10.2 5.6  --   --  6.1 9.4 13.5*  HGB 8.2* 7.9* 8.2* 8.8* 9.1* 8.9* 8.1*  HCT 26.1* 25.0* 24.0* 26.0* 29.4* 28.5* 26.4*  MCV 73.7* 73.7*  --   --  77.8* 76.8* 78.6*  PLT 64* 64*  --   --  77* 84* 75*   Basic Metabolic Panel: Recent Labs  Lab 10/21/20 1412 10/22/20 0131 10/23/20 0048 10/24/20 0316 10/24/20 1016 10/24/20 1147 10/25/20 0047 10/25/20 1529 10/26/20 0234  NA  --    < > 134* 135 138 138 137 135 134*  K  --    < > 3.9 4.1 4.1 4.0 5.6* 5.0 5.3*  CL  --    < > 103 104  --   --  103 103 103  CO2  --    < > 24 25  --   --   26 21* 20*  GLUCOSE  --    < > 108* 109*  --   --  144* 131* 113*  BUN  --    < > 20 26*  --   --  30* 37* 36*  CREATININE  --    < > 1.12 1.03  --   --  1.86* 2.57* 2.44*  CALCIUM  --    < > 8.6* 8.4*  --   --  8.8* 8.7* 8.5*  MG 1.9  --   --  2.0  --   --  2.0  --  1.9   < > = values in this interval not displayed.   GFR:  Estimated Creatinine Clearance: 25 mL/min (A) (by C-G formula based on SCr of 2.44 mg/dL (H)). Liver Function Tests: Recent Labs  Lab 10/20/20 0802 10/21/20 0056 10/22/20 0131 10/23/20 0048  AST 16 50* 12* 15  ALT 23 18 18 19   ALKPHOS 58 71 57 63  BILITOT 1.0 1.2 0.7 1.2  PROT 6.4* 7.8 5.8* 6.0*  ALBUMIN 2.3* 3.0* 2.3* 2.3*   No results for input(s): LIPASE, AMYLASE in the last 168 hours. No results for input(s): AMMONIA in the last 168 hours. Coagulation Profile: No results for input(s): INR, PROTIME in the last 168 hours. Cardiac Enzymes: No results for input(s): CKTOTAL, CKMB, CKMBINDEX, TROPONINI in the last 168 hours. BNP (last 3 results) No results for input(s): PROBNP in the last 8760 hours. HbA1C: Recent Labs    10/23/20 2107  HGBA1C 5.7*   CBG: Recent Labs  Lab 10/23/20 2126 10/24/20 0615 10/24/20 0818 10/24/20 0840 10/24/20 1248  GLUCAP 130* 98 99 93 131*   Lipid Profile: No results for input(s): CHOL, HDL, LDLCALC, TRIG, CHOLHDL, LDLDIRECT in the last 72 hours. Thyroid Function Tests: No results for input(s): TSH, T4TOTAL, FREET4, T3FREE, THYROIDAB in the last 72 hours. Anemia Panel: Recent Labs    10/23/20 1801  VITAMINB12 360  FOLATE 28.1   Sepsis Labs: No results for input(s): PROCALCITON, LATICACIDVEN in the last 168 hours.  Recent Results (from the past 240 hour(s))  SARS CORONAVIRUS 2 (TAT 6-24 HRS) Nasopharyngeal Nasopharyngeal Swab     Status: None   Collection Time: 10/17/20  8:02 PM   Specimen: Nasopharyngeal Swab  Result Value Ref Range Status   SARS Coronavirus 2 NEGATIVE NEGATIVE Final    Comment:  (NOTE) SARS-CoV-2 target nucleic acids are NOT DETECTED.  The SARS-CoV-2 RNA is generally detectable in upper and lower respiratory specimens during the acute phase of infection. Negative results do not preclude SARS-CoV-2 infection, do not rule out co-infections with other pathogens, and should not be used as the sole basis for treatment or other patient management decisions. Negative results must be combined with clinical observations, patient history, and epidemiological information. The expected result is Negative.  Fact Sheet for Patients: SugarRoll.be  Fact Sheet for Healthcare Providers: https://www.woods-mathews.com/  This test is not yet approved or cleared by the [place] FDA and  has been authorized for detection and/or diagnosis of SARS-CoV-2 by FDA under an Emergency Use Authorization (EUA). This EUA will remain  in effect (meaning this test can be used) for the duration of the COVID-19 declaration under Se ction 564(b)(1) of the Act, 21 U.S.C. section 360bbb-3(b)(1), unless the authorization is terminated or revoked sooner.  Performed at [facility], [place] [address], [place], [place] [address]   MRSA PCR Screening     Status: None   Collection Time: 10/18/20  2:15 PM   Specimen: Nasopharyngeal  Result Value Ref Range Status   MRSA by PCR NEGATIVE NEGATIVE Final    Comment:        The GeneXpert MRSA Assay (FDA approved for NASAL specimens only), is one component of a comprehensive MRSA colonization surveillance program. It is not intended to diagnose MRSA infection nor to guide or monitor treatment for MRSA infections. Performed at [facility], [place] [address] [place], [place] [address]   Surgical pcr screen     Status: Abnormal   Collection Time: 10/24/20 12:35 AM   Specimen: Nasal Mucosa; Nasal Swab  Result Value Ref Range Status   MRSA, PCR NEGATIVE NEGATIVE Final  Staphylococcus aureus  POSITIVE (A) NEGATIVE Final    Comment: (NOTE) The Xpert SA Assay (FDA approved for NASAL specimens in patients 62 years of age and older), is one component of a comprehensive surveillance program. It is not intended to diagnose infection nor to guide or monitor treatment. Performed at [facility], [place] [address] [place], [place] [address]          Radiology Studies: No results found.      Scheduled Meds: . sodium chloride   Intravenous Once  . sodium chloride   Intravenous Once  . acetaminophen  650 mg Oral Q6H  . Chlorhexidine Gluconate Cloth  6 each Topical Q0600  . digoxin  0.125 mg Oral Daily  . diltiazem  120 mg Oral QHS  . heparin injection (subcutaneous)  5,000 Units Subcutaneous Q8H  . iron polysaccharides  150 mg Oral Daily  . methocarbamol  500 mg Oral TID  . metoprolol succinate  50 mg Oral Daily  . mupirocin ointment  1 application Nasal BID  . nicotine  14 mg Transdermal Daily  . sodium zirconium cyclosilicate  5 g Oral Daily  . umeclidinium bromide  1 puff Inhalation Daily   Continuous Infusions: . sodium chloride 75 mL/hr at 10/25/20 1057     LOS: 9 days   Enzo Bi, MD  10/26/2020, 4:23 PM

## 2020-10-26 NOTE — Evaluation (Signed)
Occupational Therapy Evaluation Patient Details Name: [name] MRN: [id number] DOB: Mar 22, 1946 Today's Date: 10/26/2020    History of Present Illness Pt is 75 y.o. male transferred from echo lab to ER on 5/19 when noted to have HR in 140s, with rhythm of atrial flutter and asymptomatic. Chest Abdomen Pelvis CT shows large colonic mass in the sigmoid colon with pelvic lymphadenopathy and small pulmonary nodules indicative of metastatic disease, potential pneumonia of L lower lobe, and mild paraseptal emphysema. Pt is now s/p sigmoid colectomy with end colostomy and cystoscopy with bil ureteral stents on 10/25/20. 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   Clinical Impression   Pt admitted with above. Pt was able to complete bed mobility with min guard as due to impulsivity and sit to stand with supervision.  Pt completed ADLS with supervision with cues to pace self with activity. Pt reported they were doing limited ambulation prior to as due to COPD. Pt currently with functional limitations due to the deficits listed below (see OT Problem List).  Pt will benefit from skilled OT to increase their safety and independence with ADL and functional mobility for ADL to facilitate discharge to venue listed below.       Follow Up Recommendations  Supervision - Intermittent    Equipment Recommendations       Recommendations for Other Services       Precautions / Restrictions Precautions Precautions: Fall      Mobility Bed Mobility Overal bed mobility: Needs Assistance Bed Mobility: Supine to Sit;Sit to Supine     Supine to sit: Min guard;HOB elevated Sit to supine: Min guard;HOB elevated   General bed mobility comments: Increased time, cued for log roll technique, min guard for safety    Transfers Overall transfer level: Needs  assistance Equipment used: None Transfers: Sit to/from Stand Sit to Stand: Min guard              Balance                                           ADL either performed or assessed with clinical judgement   ADL Overall ADL's : Needs assistance/impaired Eating/Feeding: Independent;Sitting   Grooming: Wash/dry hands;Wash/dry face;Supervision/safety;Standing   Upper Body Bathing: Supervision/ safety;Standing   Lower Body Bathing: Supervison/ safety;Sit to/from stand   Upper Body Dressing : Supervision/safety;Sitting;Standing   Lower Body Dressing: Supervision/safety;Sit to/from stand   Toilet Transfer: Supervision/safety;Ambulation   Toileting- Clothing Manipulation and Hygiene: Supervision/safety;Sit to/from stand   Tub/ Banker: Supervision/safety;Cueing for safety;Cueing for sequencing   Functional mobility during ADLs: Supervision/safety       Vision         Perception     Praxis      Pertinent Vitals/Pain Pain Assessment: Faces Pain Location: abdomen with movement Pain Descriptors / Indicators: Grimacing;Discomfort Pain Intervention(s): Limited activity within patient's tolerance     Hand Dominance Right   Extremity/Trunk Assessment Upper Extremity Assessment Upper Extremity Assessment: Overall WFL for tasks assessed   Lower Extremity Assessment Lower Extremity Assessment: Defer to PT evaluation   Cervical / Trunk Assessment Cervical / Trunk Assessment: Normal   Communication Communication Communication: No difficulties   Cognition Arousal/Alertness: Awake/alert Behavior During Therapy: Impulsive Overall Cognitive Status: Within Functional Limits for tasks assessed  General Comments: Pt with hx of dementia - mild decrease in safety awareness and repeats self at times   General Comments       Exercises     Shoulder Instructions      Home Living Family/patient  expects to be discharged to:: Private residence Living Arrangements: Children Available Help at Discharge: Family;Available 24 hours/day Type of Home: Mobile home Home Access: Stairs to enter Entrance Stairs-Number of Steps: 3 Entrance Stairs-Rails: Can reach both Home Layout: One level     Bathroom Shower/Tub: Occupational psychologist: Standard Bathroom Accessibility: Yes   Home Equipment: Cane - single point          Prior Functioning/Environment Level of Independence: Independent        Comments: limited to household and very short community distances        OT Problem List: Decreased strength;Decreased activity tolerance;Decreased safety awareness;Decreased cognition;Pain      OT Treatment/Interventions: Self-care/ADL training;Therapeutic exercise;Therapeutic activities;Patient/family education;Balance training    OT Goals(Current goals can be found in the care plan section) Acute Rehab OT Goals Patient Stated Goal: return home OT Goal Formulation: With patient Time For Goal Achievement: 11/09/20 Potential to Achieve Goals: Good ADL Goals Pt Will Perform Upper Body Dressing: Independently Pt Will Perform Lower Body Dressing: Independently Pt Will Transfer to Toilet: Independently  OT Frequency: Min 2X/week   Barriers to D/C:            Co-evaluation              AM-PAC OT "6 Clicks" Daily Activity     Outcome Measure Help from another person eating meals?: None Help from another person taking care of personal grooming?: None Help from another person toileting, which includes using toliet, bedpan, or urinal?: None Help from another person bathing (including washing, rinsing, drying)?: None Help from another person to put on and taking off regular upper body clothing?: None Help from another person to put on and taking off regular lower body clothing?: None 6 Click Score: 24   End of Session Equipment Utilized During Treatment: Gait  belt Nurse Communication: Mobility status;Other (comment) (immpulsivity)  Activity Tolerance: Patient tolerated treatment well Patient left: in bed;with bed alarm set  OT Visit Diagnosis: Unsteadiness on feet (R26.81);Other abnormalities of gait and mobility (R26.89);Pain Pain - part of body:  (abdomen)                Time: 5852-7782 OT Time Calculation (min): 16 min Charges:  OT General Charges $OT Visit: 1 Visit OT Treatments $Self Care/Home Management : 8-22 mins  Joeseph Amor OTR/L  Acute Rehab Services  [phone] office number [phone] pager number   Joeseph Amor 10/26/2020, 2:22 PM

## 2020-10-26 NOTE — Progress Notes (Signed)
Progress Note  2 Days Post-Op  Subjective: Reports he is coughing blood. I looked in his cup - just small streaks of tiny blood tinged sputum. Walked yesterday but no chair sitting. No IS in room No n/v Clears ok.   Objective: Vital signs in last 24 hours: Temp:  [97.3 F (36.3 C)-98.4 F (36.9 C)] 98 F (36.7 C) (05/28 0521) Pulse Rate:  [118-129] 122 (05/28 0759) Resp:  [16-19] 16 (05/28 0759) BP: (103-151)/(66-88) 109/68 (05/28 0521) SpO2:  [91 %-98 %] 91 % (05/28 0759) Last BM Date: 10/24/20  Intake/Output from previous day: 05/27 0701 - 05/28 0700 In: 600 [P.O.:600] Out: 1585 [Urine:1450; Drains:110; Stool:25] Intake/Output this shift: No intake/output data recorded.  PE: General: WD, chronically ill appearing male who is laying in bed in NAD Heart: regular, rate, and rhythm.  Lungs:  Respiratory effort nonlabored Abd: soft, appropriately ttp, ND, drain in RLQ with SS fluid, midline wound clean without active bleeding, stoma ok with SS fluid in ostomy appliance, air in bag MS: all 4 extremities are symmetrical with no cyanosis, clubbing, or edema. Psych: A&Ox3 with an appropriate affect.    Lab Results:  Recent Labs    10/25/20 0047 10/26/20 0234  WBC 9.4 13.5*  HGB 8.9* 8.1*  HCT 28.5* 26.4*  PLT 84* 75*   BMET Recent Labs    10/25/20 1529 10/26/20 0234  NA 135 134*  K 5.0 5.3*  CL 103 103  CO2 21* 20*  GLUCOSE 131* 113*  BUN 37* 36*  CREATININE 2.57* 2.44*  CALCIUM 8.7* 8.5*   PT/INR No results for input(s): LABPROT, INR in the last 72 hours. CMP     Component Value Date/Time   NA 134 (L) 10/26/2020 0234   NA 139 01/29/2020 1548   K 5.3 (H) 10/26/2020 0234   CL 103 10/26/2020 0234   CO2 20 (L) 10/26/2020 0234   GLUCOSE 113 (H) 10/26/2020 0234   BUN 36 (H) 10/26/2020 0234   BUN 34 (H) 01/29/2020 1548   CREATININE 2.44 (H) 10/26/2020 0234   CALCIUM 8.5 (L) 10/26/2020 0234   PROT 6.0 (L) 10/23/2020 0048   PROT 7.3 01/29/2020 1548    ALBUMIN 2.3 (L) 10/23/2020 0048   ALBUMIN 3.9 01/29/2020 1548   AST 15 10/23/2020 0048   ALT 19 10/23/2020 0048   ALKPHOS 63 10/23/2020 0048   BILITOT 1.2 10/23/2020 0048   BILITOT 0.4 01/29/2020 1548   GFRNONAA 27 (L) 10/26/2020 0234   GFRAA 79 01/29/2020 1548   Lipase  No results found for: LIPASE     Studies/Results: DG Retrograde Pyelogram  Result Date: 10/24/2020 CLINICAL DATA:  75 year old male with colorectal cancer . EXAM: RETROGRADE PYELOGRAM COMPARISON:  CT chest, abdomen and pelvis 10/22/2020 FINDINGS: A total of 3 intraoperative saved images obtained during bilateral double-J ureteral stent placement are submitted for review. The images demonstrate a catheter in the bladder with contrast opacification of the bladder. Partial bilateral nephroureterograms. A ureteral stent is partially visualized on the left. No focal abnormality of the visualized portions of either ureter. IMPRESSION: Ureteropyelogram with placement of bilateral ureteral stents. Electronically Signed   By: Jacqulynn Cadet M.D.   On: 10/24/2020 11:14   DG CHEST PORT 1 VIEW  Result Date: 10/24/2020 CLINICAL DATA:  Postop central line placement EXAM: PORTABLE CHEST 1 VIEW COMPARISON:  10/17/2020 FINDINGS: Right jugular catheter is been placed with the tip in the mid SVC. No pneumothorax Median sternotomy. Negative for heart failure. Underlying chronic lung disease. No  acute infiltrate or effusion. IMPRESSION: Central line placement into the SVC.  No pneumothorax. Electronically Signed   By: Franchot Gallo M.D.   On: 10/24/2020 13:45    Anti-infectives: Anti-infectives (From admission, onward)   Start     Dose/Rate Route Frequency Ordered Stop   10/24/20 0600  cefoTEtan (CEFOTAN) 2 g in sodium chloride 0.9 % 100 mL IVPB        2 g 200 mL/hr over 30 Minutes Intravenous To [facility] 10/23/20 2031 10/24/20 1025       Assessment/Plan A fib/flutter - per cards H/O MVR - echo is stable  COPD/tobacco  abuse - smokes pipe Basal cell carcinoma x2 of LUE - currently undergoing radiation therapy DM - per primary H/O HTN ABL anemia - secondary to sigmoid colon mass Thrombocytopenia - unclear etiology. 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 Donne Hazel - POD#2 - cont CLD today, ok to have cream and sugar for coffee - BID WTD to midline wound - drain with ss fluid - continue for now and trend OP - PT/OT - monitor stoma output, WOC following for colostomy teaching  -continue foley to Monday per urology  FEN - CLD, IVF VTE -scds, placed on subcu heparin 5/27 - monitor cbc/plts  ID -cefotetan pre-op  Dispo - ambulate, OOB to chair, IS  LOS: 9 days    Greer Pickerel, MD [facility] Surgery 10/26/2020, 9:20 AM Please see Amion for pager number during day hours 7:00am-4:30pm

## 2020-10-27 DIAGNOSIS — K6389 Other specified diseases of intestine: Secondary | ICD-10-CM | POA: Diagnosis not present

## 2020-10-27 DIAGNOSIS — E43 Unspecified severe protein-calorie malnutrition: Secondary | ICD-10-CM

## 2020-10-27 DIAGNOSIS — I4891 Unspecified atrial fibrillation: Secondary | ICD-10-CM | POA: Diagnosis not present

## 2020-10-27 LAB — BASIC METABOLIC PANEL
Anion gap: 10 (ref 5–15)
BUN: 34 mg/dL — ABNORMAL HIGH (ref 8–23)
CO2: 20 mmol/L — ABNORMAL LOW (ref 22–32)
Calcium: 9.1 mg/dL (ref 8.9–10.3)
Chloride: 105 mmol/L (ref 98–111)
Creatinine, Ser: 2.41 mg/dL — ABNORMAL HIGH (ref 0.61–1.24)
GFR, Estimated: 27 mL/min — ABNORMAL LOW (ref >60)
Glucose, Bld: 113 mg/dL — ABNORMAL HIGH (ref 70–99)
Potassium: 5.5 mmol/L — ABNORMAL HIGH (ref 3.5–5.1)
Sodium: 135 mmol/L (ref 135–145)

## 2020-10-27 LAB — CBC
HCT: 25.1 % — ABNORMAL LOW (ref 39.0–52.0)
Hemoglobin: 7.9 g/dL — ABNORMAL LOW (ref 13.0–17.0)
MCH: 24.5 pg — ABNORMAL LOW (ref 26.0–34.0)
MCHC: 31.5 g/dL (ref 30.0–36.0)
MCV: 78 fL — ABNORMAL LOW (ref 80.0–100.0)
Platelets: 72 10*3/uL — ABNORMAL LOW (ref 150–400)
RBC: 3.22 MIL/uL — ABNORMAL LOW (ref 4.22–5.81)
RDW: 21.1 % — ABNORMAL HIGH (ref 11.5–15.5)
WBC: 11.4 10*3/uL — ABNORMAL HIGH (ref 4.0–10.5)
nRBC: 0.2 % (ref 0.0–0.2)

## 2020-10-27 LAB — MAGNESIUM: Magnesium: 2.1 mg/dL (ref 1.7–2.4)

## 2020-10-27 MED ORDER — BOOST / RESOURCE BREEZE PO LIQD CUSTOM
1.0000 | Freq: Two times a day (BID) | ORAL | Status: DC
  Administered 2020-10-27 – 2020-10-31 (×8): 1 via ORAL

## 2020-10-27 MED ORDER — SODIUM ZIRCONIUM CYCLOSILICATE 5 G PO PACK
5.0000 g | PACK | Freq: Two times a day (BID) | ORAL | Status: DC
  Administered 2020-10-27 – 2020-10-29 (×5): 5 g via ORAL
  Filled 2020-10-27 (×6): qty 1

## 2020-10-27 NOTE — Progress Notes (Signed)
Progress Note  3 Days Post-Op  Subjective: Got out of bed yesterday. Sore getting back into bed.  No n/v Clears ok.    Objective: Vital signs in last 24 hours: Temp:  [97.6 F (36.4 C)-98.6 F (37 C)] 98.6 F (37 C) (05/29 0400) Pulse Rate:  [110-124] 118 (05/29 0820) Resp:  [18] 18 (05/29 0820) BP: (103-133)/(58-81) 103/58 (05/29 0400) SpO2:  [90 %-96 %] 94 % (05/29 0820) Last BM Date: 10/24/20  Intake/Output from previous day: 05/28 0701 - 05/29 0700 In: 795.5 [P.O.:400; I.V.:395.5] Out: 6063 [Urine:1325; Drains:280] Intake/Output this shift: No intake/output data recorded.  PE: General: WD, chronically ill appearing male who is laying in bed in NAD Heart: regular, rate, and rhythm.  Lungs:  Respiratory effort nonlabored Abd: soft, appropriately ttp, ND, drain in RLQ with SS fluid, midline wound clean without active bleeding, stoma ok with SS fluid in ostomy appliance, air in bag MS: all 4 extremities are symmetrical with no cyanosis, clubbing, or edema. Psych: A&Ox3 with an appropriate affect.    Lab Results:  Recent Labs    10/26/20 0234 10/27/20 0551  WBC 13.5* 11.4*  HGB 8.1* 7.9*  HCT 26.4* 25.1*  PLT 75* 72*   BMET Recent Labs    10/26/20 0234 10/27/20 0133  NA 134* 135  K 5.3* 5.5*  CL 103 105  CO2 20* 20*  GLUCOSE 113* 113*  BUN 36* 34*  CREATININE 2.44* 2.41*  CALCIUM 8.5* 9.1   PT/INR No results for input(s): LABPROT, INR in the last 72 hours. CMP     Component Value Date/Time   NA 135 10/27/2020 0133   NA 139 01/29/2020 1548   K 5.5 (H) 10/27/2020 0133   CL 105 10/27/2020 0133   CO2 20 (L) 10/27/2020 0133   GLUCOSE 113 (H) 10/27/2020 0133   BUN 34 (H) 10/27/2020 0133   BUN 34 (H) 01/29/2020 1548   CREATININE 2.41 (H) 10/27/2020 0133   CALCIUM 9.1 10/27/2020 0133   PROT 6.0 (L) 10/23/2020 0048   PROT 7.3 01/29/2020 1548   ALBUMIN 2.3 (L) 10/23/2020 0048   ALBUMIN 3.9 01/29/2020 1548   AST 15 10/23/2020 0048   ALT 19  10/23/2020 0048   ALKPHOS 63 10/23/2020 0048   BILITOT 1.2 10/23/2020 0048   BILITOT 0.4 01/29/2020 1548   GFRNONAA 27 (L) 10/27/2020 0133   GFRAA 79 01/29/2020 1548   Lipase  No results found for: LIPASE     Studies/Results: No results found.  Anti-infectives: Anti-infectives (From admission, onward)   Start     Dose/Rate Route Frequency Ordered Stop   10/24/20 0600  cefoTEtan (CEFOTAN) 2 g in sodium chloride 0.9 % 100 mL IVPB        2 g 200 mL/hr over 30 Minutes Intravenous To [facility] 10/23/20 2031 10/24/20 1025       Assessment/Plan A fib/flutter - per cards H/O MVR - echo is stable  COPD/tobacco abuse - smokes pipe Basal cell carcinoma x2 of LUE - currently undergoing radiation therapy DM - per primary H/O HTN ABL anemia - secondary to sigmoid colon mass, hgb 7.9 5/28 Thrombocytopenia - unclear etiology. 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 Donne Hazel - POD#3 - adv to [place] today, ok to have cream and sugar for coffee - BID WTD to midline wound - drain with ss fluid - continue for now and trend OP - PT/OT - monitor  stoma output, WOC following for colostomy teaching  -continue foley to Monday per urology  AKI - Cr at around 2.5. good uop, per TRH FEN - FLD, IVF, hyperkalemia - per TRH VTE -scds, placed on subcu heparin 5/27 - monitor cbc/plts  ID -cefotetan pre-op  Dispo - ambulate, OOB to chair, IS  LOS: 10 days    Greer Pickerel, MD [facility] 10/27/2020, 10:05 AM Please see Amion for pager number during day hours 7:00am-4:30pm

## 2020-10-27 NOTE — Progress Notes (Addendum)
PROGRESS NOTE    [name]  WCB:[id number] DOB: 23-Aug-1945 DOA: 10/17/2020 PCP: Pcp, No   Brief Narrative:  75 years old male with PMH significant for bacterial endocarditis s/p mitral valve replacement in 2018, DVT, DM, HTN, dementia, tobacco use, COPD, atrial fibrillation, sarcoidosis, basal cell carcinoma who was transferred from echo lab to the ER with heart rate in 140s.  Patient denies any symptoms.  Patient states that he is unaware that his heart rate is elevated.  EKG shows atrial flutter with RVR.  Patient is also hypotensive.  He has not followed up with his regular doctor in several years.  Patient was seen by cardiologist and was started on digoxin and Cardizem p.o.  Apparently Cardizem IV drip dropped his blood pressure drastically.  Plan was to do TEE and DC cardioversion however over the weekend patient had guaiac positive stools and GI was consulted and patient had EGD and colonoscopy done on 10/21/2020 which showed a possible partially obstructing mass in the sigmoid colon.  Allergy confirms adenocarcinoma.  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 Benay Spice --Marvis Repress consulted Plan: --advance to full liquid, per surgery --pain management --drain management per surgery  AKI, not POA --developed after surgery, likely ATN --s/p MIVF@75  Plan: --d/c MIVF --encourage oral hydration  A. fib/a  flutter with RVR -Heart rate difficult to control in the setting of soft blood pressure  -Due to lower GI bleed is not a candidate for cardioversion. -TSH wnl --on digoxin 0.125 mg daily, cardizem 120 mg daily, Toprol 50 mg daily Plan: --cont current regimen  History of mitral valve replacement and infective endocarditis  - His ejection fraction is 65 to 70%.  Normal left ventricular function and no regional wall motion abnormalities.  Chronic blood loss anemia Severe iron deficiency anemia  -This is secondary to partially obstructing sigmoid adenocarcinoma . -Received IV iron .  Anemia panel consistent with severe iron deficiency anemia -s/p 1u pRBC for Hemoglobin is 7.4  - Appreciate GI input colonoscopy showed likely malignant partially obstructing tumor in the sigmoid colon which was biopsied. - EGD was essentially normal. Plan: --Monitor Hgb and transfuse to keep Hgb >7 --cont iron supplement  COPD --cont daily bronchodilator  Current smoker -cessation advised.  He is not ready to give up smoking yet. --cont nicotine patch  basal cell carcinoma  --left hand covered with dressing.  Acute on chronic thrombocytopenia -with iron deficiency and guaiac positive stools with nonobstructing sigmoid mass with pending biopsy.  Patient has chronic thrombocytopenia which is worsened since going on anticoagulation.   --HIT panel neg Plan: --monitor plt  Hyperkalemia --increase lokelma to 5 mg BID  Severe malnutrition in context of chronic illness --supplements per dietician    Estimated body mass index is 20.54 kg/m as calculated from the following:   Height as of this encounter: 6' (1.829 m).   Weight as of this encounter: 68.7 kg.  DVT prophylaxis: SCD Code Status:  Full code Family Communication: son updated at bedside today  disposition Plan:  Status is: Inpatient  Dispo: The patient is from: Home              Anticipated d/c is to: Home              Patient  currently is not medically stable to d/c.  Need to have stool output    Difficult to place patient No    Consultants:   Cardiology   GI  General surgery  Procedures: EGD/colonoscopy 5/23  Antimicrobials: None  Subjective:  Pt reported feeling weak and couldn't walk.  Still coughing with sputum production, but no more bleed.  No stool output yet.  No pain.    Objective: Vitals:   10/26/20 2002 10/27/20 0400 10/27/20 0820 10/27/20 1459  BP: 104/72 (!) 103/58  100/65  Pulse: (!) 110  (!) 118 (!) 103  Resp: 18 18 18 18   Temp: 98 F (36.7 C) 98.6 F (37 C)  97.6 F (36.4 C)  TempSrc: Oral Oral  Oral  SpO2: 95% 90% 94% 92%  Weight:      Height:        Intake/Output Summary (Last 24 hours) at 10/27/2020 1601 Last data filed at 10/27/2020 1330 Gross per 24 hour  Intake 300 ml  Output 905 ml  Net -605 ml   Filed Weights   10/18/20 0451 10/22/20 1550 10/24/20 0846  Weight: 81.2 kg 68.7 kg 68.7 kg    Examination:  Constitutional: NAD, AAOx3 HEENT: conjunctivae and lids normal, EOMI CV: No cyanosis.   RESP: normal respiratory effort, on RA GI: SS fluid in abdominal drain.  No stool from ostomy. Extremities: No effusions, edema in BLE SKIN: warm, dry Neuro: II - XII grossly intact.   Psych: Normal mood and affect.  Appropriate judgement and reason Foley present   Data Reviewed: I have personally reviewed following labs and imaging studies  CBC: Recent Labs  Lab 10/24/20 0316 10/24/20 1016 10/24/20 1147 10/24/20 1459 10/25/20 0047 10/26/20 0234 10/27/20 0551  WBC 5.6  --   --  6.1 9.4 13.5* 11.4*  HGB 7.9*   < > 8.8* 9.1* 8.9* 8.1* 7.9*  HCT 25.0*   < > 26.0* 29.4* 28.5* 26.4* 25.1*  MCV 73.7*  --   --  77.8* 76.8* 78.6* 78.0*  PLT 64*  --   --  77* 84* 75* 72*   < > = values in this interval not displayed.   Basic Metabolic Panel: Recent Labs  Lab 10/21/20 1412 10/22/20 0131 10/24/20 0316 10/24/20 1016 10/24/20 1147 10/25/20 0047  10/25/20 1529 10/26/20 0234 10/27/20 0133  NA  --    < > 135   < > 138 137 135 134* 135  K  --    < > 4.1   < > 4.0 5.6* 5.0 5.3* 5.5*  CL  --    < > 104  --   --  103 103 103 105  CO2  --    < > 25  --   --  26 21* 20* 20*  GLUCOSE  --    < > 109*  --   --  144* 131* 113* 113*  BUN  --    < > 26*  --   --  30* 37* 36* 34*  CREATININE  --    < > 1.03  --   --  1.86* 2.57* 2.44* 2.41*  CALCIUM  --    < >  8.4*  --   --  8.8* 8.7* 8.5* 9.1  MG 1.9  --  2.0  --   --  2.0  --  1.9 2.1   < > = values in this interval not displayed.   GFR: Estimated Creatinine Clearance: 25.3 mL/min (A) (by C-G formula based on SCr of 2.41 mg/dL (H)). Liver Function Tests: Recent Labs  Lab 10/21/20 0056 10/22/20 0131 10/23/20 0048  AST 50* 12* 15  ALT 18 18 19   ALKPHOS 71 57 63  BILITOT 1.2 0.7 1.2  PROT 7.8 5.8* 6.0*  ALBUMIN 3.0* 2.3* 2.3*   No results for input(s): LIPASE, AMYLASE in the last 168 hours. No results for input(s): AMMONIA in the last 168 hours. Coagulation Profile: No results for input(s): INR, PROTIME in the last 168 hours. Cardiac Enzymes: No results for input(s): CKTOTAL, CKMB, CKMBINDEX, TROPONINI in the last 168 hours. BNP (last 3 results) No results for input(s): PROBNP in the last 8760 hours. HbA1C: No results for input(s): HGBA1C in the last 72 hours. CBG: Recent Labs  Lab 10/23/20 2126 10/24/20 0615 10/24/20 0818 10/24/20 0840 10/24/20 1248  GLUCAP 130* 98 99 93 131*   Lipid Profile: No results for input(s): CHOL, HDL, LDLCALC, TRIG, CHOLHDL, LDLDIRECT in the last 72 hours. Thyroid Function Tests: No results for input(s): TSH, T4TOTAL, FREET4, T3FREE, THYROIDAB in the last 72 hours. Anemia Panel: No results for input(s): VITAMINB12, FOLATE, FERRITIN, TIBC, IRON, RETICCTPCT in the last 72 hours. Sepsis Labs: No results for input(s): PROCALCITON, LATICACIDVEN in the last 168 hours.  Recent Results (from the past 240 hour(s))  SARS CORONAVIRUS 2 (TAT 6-24  HRS) Nasopharyngeal Nasopharyngeal Swab     Status: None   Collection Time: 10/17/20  8:02 PM   Specimen: Nasopharyngeal Swab  Result Value Ref Range Status   SARS Coronavirus 2 NEGATIVE NEGATIVE Final    Comment: (NOTE) SARS-CoV-2 target nucleic acids are NOT DETECTED.  The SARS-CoV-2 RNA is generally detectable in upper and lower respiratory specimens during the acute phase of infection. Negative results do not preclude SARS-CoV-2 infection, do not rule out co-infections with other pathogens, and should not be used as the sole basis for treatment or other patient management decisions. Negative results must be combined with clinical observations, patient history, and epidemiological information. The expected result is Negative.  Fact Sheet for Patients: SugarRoll.be  Fact Sheet for Healthcare Providers: https://www.woods-mathews.com/  This test is not yet approved or cleared by the [place] FDA and  has been authorized for detection and/or diagnosis of SARS-CoV-2 by FDA under an Emergency Use Authorization (EUA). This EUA will remain  in effect (meaning this test can be used) for the duration of the COVID-19 declaration under Se ction 564(b)(1) of the Act, 21 U.S.C. section 360bbb-3(b)(1), unless the authorization is terminated or revoked sooner.  Performed at [facility], [place] [address] [place], [place] [address]   MRSA PCR Screening     Status: None   Collection Time: 10/18/20  2:15 PM   Specimen: Nasopharyngeal  Result Value Ref Range Status   MRSA by PCR NEGATIVE NEGATIVE Final    Comment:        The GeneXpert MRSA Assay (FDA approved for NASAL specimens only), is one component of a comprehensive MRSA colonization surveillance program. It is not intended to diagnose MRSA infection nor to guide or monitor treatment for MRSA infections. Performed at [facility], [place] [address], [place], [place] [address]    Surgical  pcr screen     Status: Abnormal   Collection Time: 10/24/20 12:35 AM   Specimen: Nasal Mucosa; Nasal Swab  Result Value Ref Range Status   MRSA, PCR NEGATIVE NEGATIVE Final   Staphylococcus aureus POSITIVE (A) NEGATIVE Final    Comment: (NOTE) The Xpert SA Assay (FDA approved for NASAL specimens in patients 73 years of age and older), is one component of a comprehensive surveillance program. It is not intended to diagnose infection nor to guide or monitor treatment. Performed at [facility], [place] [address], [place], [place] [address]          Radiology Studies: No results found.      Scheduled Meds: . sodium chloride   Intravenous Once  . sodium chloride   Intravenous Once  . Chlorhexidine Gluconate Cloth  6 each Topical Q0600  . digoxin  0.125 mg Oral Daily  . diltiazem  120 mg Oral QHS  . feeding supplement  1 Container Oral BID BM  . heparin injection (subcutaneous)  5,000 Units Subcutaneous Q8H  . iron polysaccharides  150 mg Oral Daily  . methocarbamol  500 mg Oral TID  . metoprolol succinate  50 mg Oral Daily  . mupirocin ointment  1 application Nasal BID  . nicotine  14 mg Transdermal Daily  . sodium zirconium cyclosilicate  5 g Oral BID  . umeclidinium bromide  1 puff Inhalation Daily   Continuous Infusions:    LOS: 10 days   Enzo Bi, MD  10/27/2020, 4:01 PM

## 2020-10-28 DIAGNOSIS — N179 Acute kidney failure, unspecified: Secondary | ICD-10-CM | POA: Diagnosis not present

## 2020-10-28 DIAGNOSIS — K6389 Other specified diseases of intestine: Secondary | ICD-10-CM | POA: Diagnosis not present

## 2020-10-28 DIAGNOSIS — I484 Atypical atrial flutter: Secondary | ICD-10-CM | POA: Diagnosis not present

## 2020-10-28 DIAGNOSIS — I4891 Unspecified atrial fibrillation: Secondary | ICD-10-CM | POA: Diagnosis not present

## 2020-10-28 LAB — URINALYSIS, ROUTINE W REFLEX MICROSCOPIC
Bilirubin Urine: NEGATIVE
Glucose, UA: NEGATIVE mg/dL
Ketones, ur: 20 mg/dL — AB
Nitrite: NEGATIVE
Protein, ur: 30 mg/dL — AB
RBC / HPF: >50 RBC/hpf — ABNORMAL HIGH (ref 0–5)
Specific Gravity, Urine: 1.023 (ref 1.005–1.030)
WBC, UA: >50 WBC/hpf — ABNORMAL HIGH (ref 0–5)
pH: 5 (ref 5.0–8.0)

## 2020-10-28 LAB — CBC
HCT: 25 % — ABNORMAL LOW (ref 39.0–52.0)
Hemoglobin: 7.6 g/dL — ABNORMAL LOW (ref 13.0–17.0)
MCH: 23.9 pg — ABNORMAL LOW (ref 26.0–34.0)
MCHC: 30.4 g/dL (ref 30.0–36.0)
MCV: 78.6 fL — ABNORMAL LOW (ref 80.0–100.0)
Platelets: 76 10*3/uL — ABNORMAL LOW (ref 150–400)
RBC: 3.18 MIL/uL — ABNORMAL LOW (ref 4.22–5.81)
RDW: 21.5 % — ABNORMAL HIGH (ref 11.5–15.5)
WBC: 8.1 10*3/uL (ref 4.0–10.5)
nRBC: 0 % (ref 0.0–0.2)

## 2020-10-28 LAB — MAGNESIUM: Magnesium: 2.1 mg/dL (ref 1.7–2.4)

## 2020-10-28 LAB — BASIC METABOLIC PANEL
Anion gap: 6 (ref 5–15)
BUN: 37 mg/dL — ABNORMAL HIGH (ref 8–23)
CO2: 24 mmol/L (ref 22–32)
Calcium: 8.7 mg/dL — ABNORMAL LOW (ref 8.9–10.3)
Chloride: 103 mmol/L (ref 98–111)
Creatinine, Ser: 2.37 mg/dL — ABNORMAL HIGH (ref 0.61–1.24)
GFR, Estimated: 28 mL/min — ABNORMAL LOW (ref >60)
Glucose, Bld: 130 mg/dL — ABNORMAL HIGH (ref 70–99)
Potassium: 4.6 mmol/L (ref 3.5–5.1)
Sodium: 133 mmol/L — ABNORMAL LOW (ref 135–145)

## 2020-10-28 MED ORDER — METOPROLOL TARTRATE 5 MG/5ML IV SOLN
5.0000 mg | Freq: Three times a day (TID) | INTRAVENOUS | Status: DC
  Administered 2020-10-28 (×2): 5 mg via INTRAVENOUS
  Filled 2020-10-28 (×2): qty 5

## 2020-10-28 MED ORDER — METOPROLOL TARTRATE 5 MG/5ML IV SOLN: 5.0000 mg | Freq: Four times a day (QID) | INTRAVENOUS | Status: DC

## 2020-10-28 MED ORDER — PHENOL 1.4 % MT LIQD: 1.0000 | OROMUCOSAL | Status: DC | PRN

## 2020-10-28 MED ORDER — POLYETHYLENE GLYCOL 3350 17 G PO PACK
17.0000 g | PACK | Freq: Once | ORAL | Status: AC
  Administered 2020-10-28: 17 g via ORAL
  Filled 2020-10-28: qty 1

## 2020-10-28 MED ORDER — GUAIFENESIN-DM 100-10 MG/5ML PO SYRP
5.0000 mL | ORAL_SOLUTION | ORAL | Status: DC | PRN
  Administered 2020-10-30 – 2020-10-31 (×2): 5 mL via ORAL
  Filled 2020-10-28 (×2): qty 5

## 2020-10-28 NOTE — TOC Initial Note (Addendum)
Transition of Care [facility]) - Initial/Assessment Note    Patient Details  Name: [name] MRN: [id number] Date of Birth: [birth year]  Transition of Care [facility]) CM/SW Contact:    Marilu Favre, RN Phone Number: 10/28/2020, 11:21 AM  Clinical Narrative:                 Spoke to patient at bedside. Confirmed face sheet information. Patient from home with son and daughter in law.   Discussed HHRN. Patient and family will need to be taught dressing change and ostomy care prior to discharge home. Patient will have [place] however [place] will not be able to make daily home visits. Patient voiced understanding.  Patient does not have a PCP. Patient is interested in a [facility]. Due to today being a holiday, NCM will call tomorrow to schedule an appointment at a [facility] Well accepted for [facility]   Expected Discharge Plan: [place]     Patient Goals and CMS Choice Patient states their goals for this hospitalization and ongoing recovery are:: to return to home CMS Medicare.gov Compare Post Acute Care list provided to:: Patient Choice offered to / list presented to : Patient  Expected Discharge Plan and Services Expected Discharge Plan: [place] Choice: [place] arrangements for the past 2 months: Single Family Home                 DME Arranged: N/A DME Agency: NA       HH Arranged: RN [place] Agency: [place] Date [place]: 10/28/20 Time HH Agency Contacted: 1120 Representative spoke with at [place]: [place] reviewing referral  Prior Living Arrangements/Services Living arrangements for the past 2 months: [place] with:: Adult Children Patient language and need for interpreter reviewed:: Yes Do you feel safe going back to the place where you live?: Yes      Need for Family Participation in Patient Care: Yes (Comment) Care giver support system in place?: Yes  (comment)   Criminal Activity/Legal Involvement Pertinent to Current Situation/Hospitalization: No - Comment as needed  Activities of Daily Living Home Assistive Devices/Equipment: None ADL Screening (condition at time of admission) Patient's cognitive ability adequate to safely complete daily activities?: No Is the patient deaf or have difficulty hearing?: No Does the patient have difficulty seeing, even when wearing glasses/contacts?: No Does the patient have difficulty concentrating, remembering, or making decisions?: No Patient able to express need for assistance with ADLs?: Yes Does the patient have difficulty dressing or bathing?: No Independently performs ADLs?: Yes (appropriate for developmental age) Does the patient have difficulty walking or climbing stairs?: No Weakness of Legs: None Weakness of Arms/Hands: None  Permission Sought/Granted   Permission granted to share information with : No              Emotional Assessment Appearance:: Appears stated age Attitude/Demeanor/Rapport: Engaged Affect (typically observed): Accepting Orientation: : Oriented to Self,Oriented to Place,Oriented to  Time,Oriented to Situation Alcohol / Substance Use: Not Applicable Psych Involvement: No (comment)  Admission diagnosis:  Atrial flutter (HCC) [I48.92] Atrial fibrillation with RVR (HCC) [I48.91] Atrial flutter, unspecified type ([place]) [I48.92] Chronic obstructive pulmonary disease, unspecified COPD type ([place]) [J44.9] Patient Active Problem List   Diagnosis Date Noted  . Protein-calorie malnutrition, severe 10/23/2020  . Occult blood in stools   . Iron deficiency anemia due to chronic blood loss   .  Atrial flutter ([place]) 10/17/2020  . Microcytic anemia 10/17/2020  . COPD (chronic obstructive pulmonary disease) ([place]) 10/17/2020  . Atrial fibrillation with RVR ([place]) 10/17/2020  . Basal cell carcinoma (BCC) of skin of left wrist 09/04/2020  . Acute bronchitis with COPD ([place])  01/29/2020  . Skin lesions 01/29/2020  . 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 Hx of bacterial endocarditis 01/29/2020  . Frequent falls 01/29/2020  . H/O mitral valve replacement 01/29/2020   PCP:  Pcp, No Pharmacy:   [id number] - [place], [address] AT [facility] [address] [place] [address] Phone: [phone] Fax: [phone]     Social Determinants of Health ([place]) Interventions    Readmission Risk Interventions No flowsheet data found.

## 2020-10-28 NOTE — Consult Note (Addendum)
[place] Nurse ostomy follow up Patient receiving care in [facility] 6N15. Partially obstructing adenocarcinoma of sigmoid colon, sigmoid colectomy with end colostomy performed on 10/24/20 Stoma type/location: LUQ colostomy Stomal assessment/size: 1.5 inches, round, dark red, moist, sutures intact Peristomal assessment: intact Treatment options for stomal/peristomal skin: barrier ring Output: 75ml of thin serosanginous drainage in existing pouch  Ostomy pouching: 2pc. 2 and 3/4 inches pouching system. Order and keep at the bedside ostomy pouch, Kellie Simmering #649 and skin barrier, [place]#2; also, barrier rings, Kellie Simmering [phone]. Education provided:  Today the son Aaron Edelman) was present for pouch change. Aaron Edelman was able to participate and do most of the pouch change. He cut the skin barrier to size, opened and closed the pouch. Removed the old pouch from the skin barrier and remove the skin barrier. He cleaned the skin, placed the barrier ring and the skin barrier in place. Snapped the pouch to the skin barrier. Patient states he feels good about the change process and son states he feels okay with it. WOC will sign off at this time and turn over the 2 x a week pouch change to the bedside RN.  Enrolled patient in [place] Start Discharge program: Yes  Recommend FU in ostomy clinic.   Cathlean Marseilles Tamala Julian, MSN, RN, [place], [place]le Pearl, [facility] Wound Treatment Associate Pager [phone]

## 2020-10-28 NOTE — Progress Notes (Signed)
Progress Note  Patient Name: [name] Date of Encounter: 10/28/2020  [facility] Cardiologist: Donato Heinz, MD (NEW)  Subjective   Pt notes some nausea this morning   No CP   Breathing OK in bed   Inpatient Medications    Scheduled Meds: . sodium chloride   Intravenous Once  . sodium chloride   Intravenous Once  . digoxin  0.125 mg Oral Daily  . diltiazem  120 mg Oral QHS  . feeding supplement  1 Container Oral BID BM  . heparin injection (subcutaneous)  5,000 Units Subcutaneous Q8H  . iron polysaccharides  150 mg Oral Daily  . methocarbamol  500 mg Oral TID  . metoprolol succinate  50 mg Oral Daily  . mupirocin ointment  1 application Nasal BID  . nicotine  14 mg Transdermal Daily  . polyethylene glycol  17 g Oral Once  . sodium zirconium cyclosilicate  5 g Oral BID  . umeclidinium bromide  1 puff Inhalation Daily   Continuous Infusions:   PRN Meds: alum & mag hydroxide-simeth, guaiFENesin-dextromethorphan, levalbuterol, morphine injection, oxyCODONE, phenol   Vital Signs    Vitals:   10/27/20 0820 10/27/20 1459 10/27/20 2023 10/28/20 0550  BP:  100/65 (!) 108/56 111/69  Pulse: (!) 118 (!) 103 (!) 58 89  Resp: 18 18 17 18   Temp:  97.6 F (36.4 C) 98.6 F (37 C) 97.6 F (36.4 C)  TempSrc:  Oral Oral Oral  SpO2: 94% 92% 90% 92%  Weight:      Height:        Intake/Output Summary (Last 24 hours) at 10/28/2020 0959 Last data filed at 10/28/2020 0354 Gross per 24 hour  Intake 575 ml  Output 475 ml  Net 100 ml   Last 3 Weights 10/24/2020 10/22/2020 10/18/2020  Weight (lbs) 151 lb 7.3 oz 151 lb 7.3 oz 179 lb  Weight (kg) 68.7 kg 68.7 kg 81.194 kg      Telemetry    Atrail flutter   80s to 120s - Personally Reviewed  ECG    No new  Personally Reviewed  Physical Exam   GEN: Thin 75 yo acute distress.  Neck: No JVD Cardiac: RRR, no murmurs,  Respiratory:Bilateral rhonchi     GI: Sl distended   No BS  Colostomy bag in place   MS:  No edema; No deformity. Neuro=  Deferred   Psych: Normal affect   Labs    High Sensitivity Troponin:   Recent Labs  Lab 10/17/20 1446 10/17/20 1646  TROPONINIHS 5 6      Chemistry Recent Labs  Lab 10/22/20 0131 10/23/20 0048 10/24/20 0316 10/26/20 0234 10/27/20 0133 10/28/20 0020  NA 137 134*   < > 134* 135 133*  K 4.2 3.9   < > 5.3* 5.5* 4.6  CL 104 103   < > 103 105 103  CO2 27 24   < > 20* 20* 24  GLUCOSE 103* 108*   < > 113* 113* 130*  BUN 25* 20   < > 36* 34* 37*  CREATININE 1.25* 1.12   < > 2.44* 2.41* 2.37*  CALCIUM 8.7* 8.6*   < > 8.5* 9.1 8.7*  PROT 5.8* 6.0*  --   --   --   --   ALBUMIN 2.3* 2.3*  --   --   --   --   AST 12* 15  --   --   --   --   ALT 18  19  --   --   --   --   ALKPHOS 57 63  --   --   --   --   BILITOT 0.7 1.2  --   --   --   --   GFRNONAA 60* >60   < > 27* 27* 28*  ANIONGAP 6 7   < > 11 10 6    < > = values in this interval not displayed.     Hematology Recent Labs  Lab 10/26/20 0234 10/27/20 0551 10/28/20 0020  WBC 13.5* 11.4* 8.1  RBC 3.36* 3.22* 3.18*  HGB 8.1* 7.9* 7.6*  HCT 26.4* 25.1* 25.0*  MCV 78.6* 78.0* 78.6*  MCH 24.1* 24.5* 23.9*  MCHC 30.7 31.5 30.4  RDW 20.5* 21.1* 21.5*  PLT 75* 72* 76*    BNPNo results for input(s): BNP, PROBNP in the last 168 hours.   DDimer No results for input(s): DDIMER in the last 168 hours.   Radiology    No results found.  Cardiac Studies   Echo 10/19/20  1. Left ventricular ejection fraction, by estimation, is 65 to 70%. The left ventricle has normal function. The left ventricle has no regional wall motion abnormalities. Left ventricular diastolic function could not be evaluated. 2. Right ventricular systolic function is normal. The right ventricular size is mildly enlarged. There is normal pulmonary artery systolic pressure. The estimated right ventricular systolic pressure is 99.8 mmHg. 3. 31 mm mosaic prosthetic valve with MG 7 mmHG @ 130 bpm. EOA 2.32 cm2. DI 1.6.  Normal prosthesis with higher gradients due to tachycardia. The mitral valve has been repaired/replaced. No evidence of mitral valve regurgitation. There is a 31 mm Medtronic Mosaic bioprosthetic valve present in the mitral position. Procedure Date: 07/14/2016. 4. The aortic valve is tricuspid. Aortic valve regurgitation is not visualized. No aortic stenosis is present. 5. The inferior vena cava is normal in size with greater than 50% respiratory variability, suggesting right atrial pressure of 3 mmHg.  Patient Profile          75 y.o. 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  Assessment & Plan   1  Atypical atrial flutter  Pt presented with new atrial flutter    (note he had afib after surgery in 2018 requiring cardioversion and Rx with amiodarone, no anticoaugalation in past;  Lost to follow up) Presented this hosp with afib wit hRVR   Initial thought for cardioversion but then found to have malignancy   Now plan was for rate control     Review of tele from past 24 hours:  Overall not too bad but patient has prolonged periods of HR in 120s    Lowest rates in 80s   (prob driven by pain, anemia, fluid status)   Pt is currently on:   Toprol XL 50; Dilt CD 120  and digoxin 0.125 mg   Recomm: - With renal insuff I would dc digoxin  - Given poor po intake and ileus, question drug absorption I would adding intermitt IV metoprolol to his regimen Metoprolol 5 mg q 8hours    Follow BP/ HR   Transition back to just oral once he is taking food better -  Check UA for SG May benefit from IV fluids if po intatke poor -  Anticoagulation (CHA2DS2Vasc  4).  Given profound anemia, thrombocytopenai and recent surgery  hold for now   Long term needs to be addressed, or even short term with poss cardioversion. 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  On full liquids  Oncology is seeing patient    4  AKI   Cr 2.37   Improving    Was 1.18 on admit    Check UA  For SG    5   Heme  H/H 7.6/ 25   Microcytic/hypochromic   Plt 76K  Platelet clumping noted  on CBC   On oral Fe Rx   Transfuse if goes below 7. Hold on systemic  anticoagulation for right now.   Input from surgery and heme appreciated for this.       For questions or updates, please contact [place] Please consult [email or web address] for contact info under        Signed, Dorris Carnes, MD  10/28/2020, 9:59 AM

## 2020-10-28 NOTE — Progress Notes (Signed)
Progress Note  4 Days Post-Op  Subjective: Did not get out of bed yesterday.  No n/v but burping    Objective: Vital signs in last 24 hours: Temp:  [97.6 F (36.4 C)-98.6 F (37 C)] 97.6 F (36.4 C) (05/30 0550) Pulse Rate:  [58-103] 89 (05/30 0550) Resp:  [17-18] 18 (05/30 0550) BP: (100-111)/(56-69) 111/69 (05/30 0550) SpO2:  [90 %-92 %] 92 % (05/30 0550) Last BM Date: 10/24/20  Intake/Output from previous day: 05/29 0701 - 05/30 0700 In: 575 [P.O.:575] Out: 525 [Urine:450; Drains:25; Stool:50] Intake/Output this shift: No intake/output data recorded.  PE: General: WD, chronically ill appearing male who is laying in bed in NAD Heart: regular, rate, and rhythm.  Lungs:  Respiratory effort nonlabored Abd: soft, appropriately ttp, ND, drain in RLQ with SS fluid, midline wound clean without active bleeding, stoma ok with SS fluid in ostomy appliance, air in bag, distended MS: all 4 extremities are symmetrical with no cyanosis, clubbing, or edema. Psych: A&Ox3 with an appropriate affect.    Lab Results:  Recent Labs    10/27/20 0551 10/28/20 0020  WBC 11.4* 8.1  HGB 7.9* 7.6*  HCT 25.1* 25.0*  PLT 72* 76*   BMET Recent Labs    10/27/20 0133 10/28/20 0020  NA 135 133*  K 5.5* 4.6  CL 105 103  CO2 20* 24  GLUCOSE 113* 130*  BUN 34* 37*  CREATININE 2.41* 2.37*  CALCIUM 9.1 8.7*   PT/INR No results for input(s): LABPROT, INR in the last 72 hours. CMP     Component Value Date/Time   NA 133 (L) 10/28/2020 0020   NA 139 01/29/2020 1548   K 4.6 10/28/2020 0020   CL 103 10/28/2020 0020   CO2 24 10/28/2020 0020   GLUCOSE 130 (H) 10/28/2020 0020   BUN 37 (H) 10/28/2020 0020   BUN 34 (H) 01/29/2020 1548   CREATININE 2.37 (H) 10/28/2020 0020   CALCIUM 8.7 (L) 10/28/2020 0020   PROT 6.0 (L) 10/23/2020 0048   PROT 7.3 01/29/2020 1548   ALBUMIN 2.3 (L) 10/23/2020 0048   ALBUMIN 3.9 01/29/2020 1548   AST 15 10/23/2020 0048   ALT 19 10/23/2020 0048    ALKPHOS 63 10/23/2020 0048   BILITOT 1.2 10/23/2020 0048   BILITOT 0.4 01/29/2020 1548   GFRNONAA 28 (L) 10/28/2020 0020   GFRAA 79 01/29/2020 1548   Lipase  No results found for: LIPASE     Studies/Results: No results found.  Anti-infectives: Anti-infectives (From admission, onward)   Start     Dose/Rate Route Frequency Ordered Stop   10/24/20 0600  cefoTEtan (CEFOTAN) 2 g in sodium chloride 0.9 % 100 mL IVPB        2 g 200 mL/hr over 30 Minutes Intravenous To [facility] 10/23/20 2031 10/24/20 1025       Assessment/Plan A fib/flutter - per cards H/O MVR - echo is stable  COPD/tobacco abuse - smokes pipe Basal cell carcinoma x2 of LUE - currently undergoing radiation therapy DM - per primary H/O HTN ABL anemia - secondary to sigmoid colon mass, hgb 7.9 5/28, 7.6 (5/30) Thrombocytopenia - unclear etiology. 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 Donne Hazel - POD#4 - keep on FLD today, ok to have cream and sugar for coffee - BID WTD to midline wound - drain with ss fluid - continue for now and trend OP - PT/OT - monitor  stoma output, WOC following for colostomy teaching  -continue foley to Monday per urology  AKI - Cr slowly improving 2.5-->2.-->2.37. good uop, per TRH FEN - FLD, IVF, hyperkalemia - resolved VTE -scds, placed on subcu heparin 5/27 - monitor cbc/plts  ID -cefotetan pre-op  Dispo - ambulate, OOB to chair, IS, d/c foley, not really any ostomy output. Will give 1 dose of miralax today. If does not open up soon, will need TPN  LOS: 11 days    Greer Pickerel, MD [facility] 10/28/2020, 9:02 AM Please see Amion for pager number during day hours 7:00am-4:30pm

## 2020-10-28 NOTE — Progress Notes (Signed)
PROGRESS NOTE    [name]  NID:[id number] DOB: [birth year] DOA: 10/17/2020 PCP: Pcp, No   Brief Narrative:  75 years old male with PMH significant for bacterial endocarditis s/p mitral valve replacement in 2018, DVT, DM, HTN, dementia, tobacco use, COPD, atrial fibrillation, sarcoidosis, basal cell carcinoma who was transferred from echo lab to the ER with heart rate in 140s.  Patient denies any symptoms.  Patient states that he is unaware that his heart rate is elevated.  EKG shows atrial flutter with RVR.  Patient is also hypotensive.  He has not followed up with his regular doctor in several years.  Patient was seen by cardiologist and was started on digoxin and Cardizem p.o.  Apparently Cardizem IV drip dropped his blood pressure drastically.  Plan was to do TEE and DC cardioversion however over the weekend patient had guaiac positive stools and GI was consulted and patient had EGD and colonoscopy done on 10/21/2020 which showed a possible partially obstructing mass in the sigmoid colon.  Allergy confirms adenocarcinoma.  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 Benay Spice --Marvis Repress consulted --no stool output yet Plan: --cont Full liquid, advance per surgery --avoid opioids pain med to prevent ileus  --drain management per surgery --bowel regimen per surgery  AKI, not POA --developed after surgery, likely  ATN --s/p MIVF@75 , stopped on 5/29 since it didn't improve Cr and pt tolerating oral hydration Plan: --encourage oral hydration --Hold IVF  Atypical Aflutter with RVR -Heart rate difficult to control in the setting of soft blood pressure  -Due to lower GI bleed is not a candidate for cardioversion. -TSH wnl --on digoxin 0.125 mg daily, cardizem 120 mg daily, Toprol 50 mg daily Plan: --cont cardizem and Toprol --d/c digoxin, per card due to renal insuff --intermitt IV metoprolol   History of mitral valve replacement and infective endocarditis  - His ejection fraction is 65 to 70%.  Normal left ventricular function and no regional wall motion abnormalities.  Chronic blood loss anemia Severe iron deficiency anemia  -This is secondary to partially obstructing sigmoid adenocarcinoma . -Received IV iron .  Anemia panel consistent with severe iron deficiency anemia -s/p 1u pRBC for Hemoglobin is 7.4  - Appreciate GI input colonoscopy showed likely malignant partially obstructing tumor in the sigmoid colon which was biopsied. - EGD was essentially normal. Plan: --Monitor Hgb and transfuse to keep Hgb >7 --cont iron supplement  COPD --cont daily bronchodilator  Current smoker -cessation advised.  He is not ready to give up smoking yet. --cont nicotine patch  basal cell carcinoma  --left hand covered with dressing.  Acute on chronic thrombocytopenia -with iron deficiency and guaiac positive stools with nonobstructing sigmoid mass with pending biopsy.  Patient has chronic thrombocytopenia which is worsened since going on anticoagulation.   --HIT panel neg Plan: --monitor plt  Hyperkalemia --cont lokelma 5 mg BID  Severe malnutrition in context of chronic illness --supplements per dietician  Estimated body mass index is 20.54 kg/m as calculated from the following:   Height as of this encounter: 6' (1.829 m).   Weight as of this encounter: 68.7 kg.  DVT prophylaxis:  SCD Code Status: Full code Family Communication:   disposition Plan:  Status is: Inpatient  Dispo: The patient is from: Home              Anticipated d/c is to: Home              Patient currently is not medically stable to d/c.  Need to have stool output    Difficult to place patient No    Consultants:   Cardiology   GI  General surgery  Procedures: EGD/colonoscopy 5/23  Antimicrobials: None  Subjective: Pt continued to have cough with sputum production.  Reported abdominal pain today.  Still no stool output.   Foley out today.     Objective: Vitals:   10/27/20 1459 10/27/20 2023 10/28/20 0550 10/28/20 1528  BP: 100/65 (!) 108/56 111/69 116/80  Pulse: (!) 103 (!) 58 89 (!) 101  Resp: 18 17 18 17   Temp: 97.6 F (36.4 C) 98.6 F (37 C) 97.6 F (36.4 C) 98.6 F (37 C)  TempSrc: Oral Oral Oral Oral  SpO2: 92% 90% 92% 93%  Weight:      Height:        Intake/Output Summary (Last 24 hours) at 10/28/2020 1737 Last data filed at 10/28/2020 1528 Gross per 24 hour  Intake 675 ml  Output 505 ml  Net 170 ml   Filed Weights   10/18/20 0451 10/22/20 1550 10/24/20 0846  Weight: 81.2 kg 68.7 kg 68.7 kg    Examination:  Constitutional: NAD, AAOx3 HEENT: conjunctivae and lids normal, EOMI CV: No cyanosis.   RESP: normal respiratory effort, on RA GI: abdominal drain ss output, no stool from colostomy Extremities: No effusions, edema in BLE SKIN: warm, dry Neuro: II - XII grossly intact.   Psych: Normal mood and affect.  Appropriate judgement and reason   Data Reviewed: I have personally reviewed following labs and imaging studies  CBC: Recent Labs  Lab 10/24/20 1459 10/25/20 0047 10/26/20 0234 10/27/20 0551 10/28/20 0020  WBC 6.1 9.4 13.5* 11.4* 8.1  HGB 9.1* 8.9* 8.1* 7.9* 7.6*  HCT 29.4* 28.5* 26.4* 25.1* 25.0*  MCV 77.8* 76.8* 78.6* 78.0* 78.6*  PLT 77* 84* 75* 72* 76*   Basic Metabolic Panel: Recent Labs  Lab 10/24/20 0316 10/24/20 1016  10/25/20 0047 10/25/20 1529 10/26/20 0234 10/27/20 0133 10/28/20 0020  NA 135   < > 137 135 134* 135 133*  K 4.1   < > 5.6* 5.0 5.3* 5.5* 4.6  CL 104  --  103 103 103 105 103  CO2 25  --  26 21* 20* 20* 24  GLUCOSE 109*  --  144* 131* 113* 113* 130*  BUN 26*  --  30* 37* 36* 34* 37*  CREATININE 1.03  --  1.86* 2.57* 2.44* 2.41* 2.37*  CALCIUM 8.4*  --  8.8* 8.7* 8.5* 9.1 8.7*  MG 2.0  --  2.0  --  1.9 2.1 2.1   < > = values in this interval not displayed.   GFR: Estimated Creatinine Clearance: 25.8 mL/min (A) (by C-G formula based on SCr of 2.37 mg/dL (H)). Liver Function Tests: Recent Labs  Lab 10/22/20 0131 10/23/20 0048  AST 12* 15  ALT 18 19  ALKPHOS 57 63  BILITOT 0.7 1.2  PROT 5.8*  6.0*  ALBUMIN 2.3* 2.3*   No results for input(s): LIPASE, AMYLASE in the last 168 hours. No results for input(s): AMMONIA in the last 168 hours. Coagulation Profile: No results for input(s): INR, PROTIME in the last 168 hours. Cardiac Enzymes: No results for input(s): CKTOTAL, CKMB, CKMBINDEX, TROPONINI in the last 168 hours. BNP (last 3 results) No results for input(s): PROBNP in the last 8760 hours. HbA1C: No results for input(s): HGBA1C in the last 72 hours. CBG: Recent Labs  Lab 10/23/20 2126 10/24/20 0615 10/24/20 0818 10/24/20 0840 10/24/20 1248  GLUCAP 130* 98 99 93 131*   Lipid Profile: No results for input(s): CHOL, HDL, LDLCALC, TRIG, CHOLHDL, LDLDIRECT in the last 72 hours. Thyroid Function Tests: No results for input(s): TSH, T4TOTAL, FREET4, T3FREE, THYROIDAB in the last 72 hours. Anemia Panel: No results for input(s): VITAMINB12, FOLATE, FERRITIN, TIBC, IRON, RETICCTPCT in the last 72 hours. Sepsis Labs: No results for input(s): PROCALCITON, LATICACIDVEN in the last 168 hours.  Recent Results (from the past 240 hour(s))  Surgical pcr screen     Status: Abnormal   Collection Time: 10/24/20 12:35 AM   Specimen: Nasal Mucosa; Nasal Swab  Result Value Ref  Range Status   MRSA, PCR NEGATIVE NEGATIVE Final   Staphylococcus aureus POSITIVE (A) NEGATIVE Final    Comment: (NOTE) The Xpert SA Assay (FDA approved for NASAL specimens in patients 74 years of age and older), is one component of a comprehensive surveillance program. It is not intended to diagnose infection nor to guide or monitor treatment. Performed at [facility], [place] [address], [place], [place] [address]          Radiology Studies: No results found.      Scheduled Meds: . sodium chloride   Intravenous Once  . sodium chloride   Intravenous Once  . diltiazem  120 mg Oral QHS  . feeding supplement  1 Container Oral BID BM  . heparin injection (subcutaneous)  5,000 Units Subcutaneous Q8H  . iron polysaccharides  150 mg Oral Daily  . methocarbamol  500 mg Oral TID  . metoprolol tartrate  5 mg Intravenous Q8H  . mupirocin ointment  1 application Nasal BID  . nicotine  14 mg Transdermal Daily  . sodium zirconium cyclosilicate  5 g Oral BID  . umeclidinium bromide  1 puff Inhalation Daily   Continuous Infusions:    LOS: 11 days   Enzo Bi, MD  10/28/2020, 5:37 PM

## 2020-10-28 NOTE — Progress Notes (Signed)
Occupational Therapy Treatment Patient Details Name: [name] MRN: [id number] DOB: Feb 10, 1946 Today's Date: 10/28/2020    History of present illness 75 y.o. male transferred from echo lab to ER on 5/19 when noted to have HR in 140s, with rhythm of atrial flutter and asymptomatic. Chest Abdomen Pelvis CT shows large colonic mass in the sigmoid colon with pelvic lymphadenopathy and small pulmonary nodules indicative of metastatic disease, potential pneumonia of L lower lobe, and mild paraseptal emphysema. Pt is now s/p sigmoid colectomy with end colostomy and cystoscopy with bil ureteral stents on 10/25/20. 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   OT comments  Pt progressed to sink level grooming this session requiring seated positioning. Pt directing therapist on all adl ideas needed at sink prior to transfer showing awareness to adl items required and sequence. Pt voiding bladder this session after foley removal. Pt watching western at end of session in the bed.    Follow Up Recommendations  Supervision - Intermittent    Equipment Recommendations  None recommended by OT    Recommendations for Other Services      Precautions / Restrictions Precautions Precautions: Fall Precaution Comments: ostomy / Jp drain Restrictions Weight Bearing Restrictions: No       Mobility Bed Mobility Overal bed mobility: Modified Independent             General bed mobility comments: HOB elevated    Transfers Overall transfer level: Needs assistance   Transfers: Sit to/from Stand Sit to Stand: Min guard              Balance                                           ADL either performed or assessed with clinical judgement   ADL Overall ADL's : Needs assistance/impaired Eating/Feeding: Set up;Bed level    Grooming: Oral care;Set up;Sitting                   Toilet Transfer: Nature conservation officer;Ambulation Toilet Transfer Details (indicate cue type and reason): pt able to bend over and open lid Toileting- Clothing Manipulation and Hygiene: Min guard       Functional mobility during ADLs: Min guard General ADL Comments: pt initially requesting oral care at bedside but agrreable to sink level after request     Vision       Perception     Praxis      Cognition Arousal/Alertness: Awake/alert Behavior During Therapy: Impulsive Overall Cognitive Status: History of cognitive impairments - at baseline                                 General Comments: hx of dementia but able to recall where tooth brush is in the room and awareness to JP drain safety        Exercises     Shoulder Instructions       General Comments VSS    Pertinent Vitals/ Pain       Pain Assessment: No/denies pain  Home Living  Prior Functioning/Environment              Frequency  Min 2X/week        Progress Toward Goals  OT Goals(current goals can now be found in the care plan section)  Progress towards OT goals: Progressing toward goals  Acute Rehab OT Goals Patient Stated Goal: return home OT Goal Formulation: With patient Time For Goal Achievement: 11/09/20 Potential to Achieve Goals: Good ADL Goals Pt Will Perform Upper Body Dressing: Independently Pt Will Perform Lower Body Dressing: Independently Pt Will Transfer to Toilet: Independently  Plan Discharge plan remains appropriate    Co-evaluation                 AM-PAC OT "6 Clicks" Daily Activity     Outcome Measure   Help from another person eating meals?: None Help from another person taking care of personal grooming?: None Help from another person toileting, which includes using toliet, bedpan, or urinal?: None Help from another  person bathing (including washing, rinsing, drying)?: None Help from another person to put on and taking off regular upper body clothing?: None Help from another person to put on and taking off regular lower body clothing?: None 6 Click Score: 24    End of Session    OT Visit Diagnosis: Unsteadiness on feet (R26.81);Other abnormalities of gait and mobility (R26.89);Pain   Activity Tolerance Patient tolerated treatment well   Patient Left in bed;with bed alarm set;with call bell/phone within reach   Nurse Communication Mobility status;Other (comment)        Time: 6945-0388 OT Time Calculation (min): 14 min  Charges: OT General Charges $OT Visit: 1 Visit OT Treatments $Self Care/Home Management : 8-22 mins   Brynn, OTR/L  Acute Rehabilitation Services Pager: [phone] Office: [phone] .    [name] 10/28/2020, 3:00 PM

## 2020-10-28 NOTE — Progress Notes (Signed)
Physical Therapy Treatment Patient Details Name: [name] MRN: [id number] DOB: [birth year] Today's Date: 10/28/2020    History of Present Illness 75 y.o. male transferred from echo lab to ER on 5/19 when noted to have HR in 140s, with rhythm of atrial flutter and asymptomatic. Chest Abdomen Pelvis CT shows large colonic mass in the sigmoid colon with pelvic lymphadenopathy and small pulmonary nodules indicative of metastatic disease, potential pneumonia of L lower lobe, and mild paraseptal emphysema. Pt is now s/p sigmoid colectomy with end colostomy and cystoscopy with bil ureteral stents on 10/25/20. 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    PT Comments    Pt progressing towards his physical therapy goals. Ambulating 40 ft, then an additional 40 ft with no assistive device at a min guard assist level. HR peak 122 bpm. Due to continued diminished endurance, could benefit from a Rollator for energy conservation.     Follow Up Recommendations  No PT follow up;Supervision for mobility/OOB     Equipment Recommendations   (Rollator)    Recommendations for Other Services       Precautions / Restrictions Precautions Precautions: Fall Precaution Comments: ostomy / Jp drain Restrictions Weight Bearing Restrictions: No    Mobility  Bed Mobility Overal bed mobility: Modified Independent             General bed mobility comments: HOB elevated    Transfers Overall transfer level: Needs assistance   Transfers: Sit to/from Stand Sit to Stand: Supervision            Ambulation/Gait Ambulation/Gait assistance: Min guard;Min assist Gait Distance (Feet): 80 Feet (40", 40") Assistive device: None Gait Pattern/deviations: Step-through pattern;Decreased stride length;Trunk flexed     General Gait Details: Pt ambulating  40 ft, then an additional 40 ft with a seated rest break in between. One posterior LOB when turning to look around his shoulder, requiring minA to correct. Overall, min guard assist for stability   Stairs             Wheelchair Mobility    Modified Rankin (Stroke Patients Only)       Balance Overall balance assessment: Needs assistance Sitting-balance support: No upper extremity supported Sitting balance-Leahy Scale: Good     Standing balance support: No upper extremity supported;During functional activity Standing balance-Leahy Scale: Fair                              Cognition Arousal/Alertness: Awake/alert Behavior During Therapy: Impulsive Overall Cognitive Status: History of cognitive impairments - at baseline                                 General Comments: following all commands      Exercises      General Comments General comments (skin integrity, edema, etc.): VSS      Pertinent Vitals/Pain Pain Assessment: No/denies pain    Home Living                      Prior Function            PT Goals (current goals can now be found in the care plan section) Acute Rehab PT Goals Patient Stated Goal: return home Potential to Achieve Goals: Good Progress towards PT goals:  Progressing toward goals    Frequency    Min 3X/week      PT Plan Current plan remains appropriate    Co-evaluation              AM-PAC PT "6 Clicks" Mobility   Outcome Measure  Help needed turning from your back to your side while in a flat bed without using bedrails?: None Help needed moving from lying on your back to sitting on the side of a flat bed without using bedrails?: A Little Help needed moving to and from a bed to a chair (including a wheelchair)?: A Little Help needed standing up from a chair using your arms (e.g., wheelchair or bedside chair)?: A Little Help needed to walk in hospital room?: A Little Help needed  climbing 3-5 steps with a railing? : A Little 6 Click Score: 19    End of Session   Activity Tolerance: Patient tolerated treatment well Patient left: in bed;with call bell/phone within reach;with bed alarm set   PT Visit Diagnosis: Other abnormalities of gait and mobility (R26.89)     Time: 6861-6837 PT Time Calculation (min) (ACUTE ONLY): 10 min  Charges:  $Therapeutic Activity: 8-22 mins                     Wyona Almas, PT, DPT Acute Rehabilitation Services Pager [phone] Office [place] 10/28/2020, 5:19 PM

## 2020-10-29 ENCOUNTER — Inpatient Hospital Stay (HOSPITAL_COMMUNITY): Payer: Medicare Other

## 2020-10-29 DIAGNOSIS — K6389 Other specified diseases of intestine: Secondary | ICD-10-CM | POA: Diagnosis not present

## 2020-10-29 DIAGNOSIS — Z952 Presence of prosthetic heart valve: Secondary | ICD-10-CM | POA: Diagnosis not present

## 2020-10-29 DIAGNOSIS — I484 Atypical atrial flutter: Secondary | ICD-10-CM | POA: Diagnosis not present

## 2020-10-29 DIAGNOSIS — I4891 Unspecified atrial fibrillation: Secondary | ICD-10-CM | POA: Diagnosis not present

## 2020-10-29 DIAGNOSIS — J449 Chronic obstructive pulmonary disease, unspecified: Secondary | ICD-10-CM | POA: Diagnosis not present

## 2020-10-29 LAB — BASIC METABOLIC PANEL
Anion gap: 6 (ref 5–15)
BUN: 31 mg/dL — ABNORMAL HIGH (ref 8–23)
CO2: 27 mmol/L (ref 22–32)
Calcium: 8.8 mg/dL — ABNORMAL LOW (ref 8.9–10.3)
Chloride: 101 mmol/L (ref 98–111)
Creatinine, Ser: 1.46 mg/dL — ABNORMAL HIGH (ref 0.61–1.24)
GFR, Estimated: 50 mL/min — ABNORMAL LOW (ref >60)
Glucose, Bld: 120 mg/dL — ABNORMAL HIGH (ref 70–99)
Potassium: 4.1 mmol/L (ref 3.5–5.1)
Sodium: 134 mmol/L — ABNORMAL LOW (ref 135–145)

## 2020-10-29 LAB — CBC
HCT: 25 % — ABNORMAL LOW (ref 39.0–52.0)
Hemoglobin: 7.6 g/dL — ABNORMAL LOW (ref 13.0–17.0)
MCH: 23.8 pg — ABNORMAL LOW (ref 26.0–34.0)
MCHC: 30.4 g/dL (ref 30.0–36.0)
MCV: 78.1 fL — ABNORMAL LOW (ref 80.0–100.0)
Platelets: 84 10*3/uL — ABNORMAL LOW (ref 150–400)
RBC: 3.2 MIL/uL — ABNORMAL LOW (ref 4.22–5.81)
RDW: 22.1 % — ABNORMAL HIGH (ref 11.5–15.5)
WBC: 5.3 10*3/uL (ref 4.0–10.5)
nRBC: 0.4 % — ABNORMAL HIGH (ref 0.0–0.2)

## 2020-10-29 LAB — PREALBUMIN: Prealbumin: 6.5 mg/dL — ABNORMAL LOW (ref 18–38)

## 2020-10-29 LAB — MAGNESIUM: Magnesium: 2 mg/dL (ref 1.7–2.4)

## 2020-10-29 MED ORDER — ENSURE SURGERY PO LIQD
237.0000 mL | Freq: Three times a day (TID) | ORAL | Status: DC
  Administered 2020-10-29 – 2020-10-30 (×2): 237 mL via ORAL
  Filled 2020-10-29 (×8): qty 237

## 2020-10-29 MED ORDER — POLYETHYLENE GLYCOL 3350 17 G PO PACK
34.0000 g | PACK | Freq: Two times a day (BID) | ORAL | Status: DC
  Administered 2020-10-29 – 2020-10-30 (×2): 34 g via ORAL
  Filled 2020-10-29 (×3): qty 2

## 2020-10-29 MED ORDER — MAGNESIUM CITRATE PO SOLN
0.5000 | Freq: Once | ORAL | Status: AC
  Administered 2020-10-29: 0.5 via ORAL
  Filled 2020-10-29: qty 296

## 2020-10-29 MED ORDER — METOPROLOL TARTRATE 50 MG PO TABS
50.0000 mg | ORAL_TABLET | Freq: Two times a day (BID) | ORAL | Status: DC
  Administered 2020-10-29 – 2020-11-01 (×7): 50 mg via ORAL
  Filled 2020-10-29 (×7): qty 1

## 2020-10-29 MED ORDER — ACETAMINOPHEN 325 MG PO TABS
650.0000 mg | ORAL_TABLET | Freq: Four times a day (QID) | ORAL | Status: DC
  Administered 2020-10-29 – 2020-11-01 (×12): 650 mg via ORAL
  Filled 2020-10-29 (×12): qty 2

## 2020-10-29 MED ORDER — OXYCODONE HCL 5 MG PO TABS
2.5000 mg | ORAL_TABLET | ORAL | Status: DC | PRN
  Administered 2020-10-30 – 2020-10-31 (×2): 5 mg via ORAL
  Filled 2020-10-29 (×3): qty 1

## 2020-10-29 NOTE — Progress Notes (Signed)
Progress Note  Patient Name: [name] Date of Encounter: 10/29/2020  [facility] Cardiologist: Donato Heinz, MD   Subjective   Alert, appears comfortable.  Reports abdominal discomfort. Remains in atrial flutter with variable AV block and a ventricular rate mostly in the 70s and 80s.  Unaware of the arrhythmia.  Inpatient Medications    Scheduled Meds: . sodium chloride   Intravenous Once  . sodium chloride   Intravenous Once  . acetaminophen  650 mg Oral Q6H  . diltiazem  120 mg Oral QHS  . feeding supplement  1 Container Oral BID BM  . heparin injection (subcutaneous)  5,000 Units Subcutaneous Q8H  . iron polysaccharides  150 mg Oral Daily  . methocarbamol  500 mg Oral TID  . metoprolol tartrate  5 mg Intravenous Q8H  . nicotine  14 mg Transdermal Daily  . sodium zirconium cyclosilicate  5 g Oral BID  . umeclidinium bromide  1 puff Inhalation Daily   Continuous Infusions:  PRN Meds: alum & mag hydroxide-simeth, guaiFENesin-dextromethorphan, levalbuterol, morphine injection, oxyCODONE, phenol   Vital Signs    Vitals:   10/28/20 0550 10/28/20 1528 10/28/20 2005 10/29/20 0339  BP: 111/69 116/80 123/75 106/64  Pulse: 89 (!) 101 (!) 108 78  Resp: 18 17 19 18   Temp: 97.6 F (36.4 C) 98.6 F (37 C) 98 F (36.7 C) 98.3 F (36.8 C)  TempSrc: Oral Oral Oral Axillary  SpO2: 92% 93% 90% 91%  Weight:      Height:        Intake/Output Summary (Last 24 hours) at 10/29/2020 1108 Last data filed at 10/29/2020 1000 Gross per 24 hour  Intake 840 ml  Output 700 ml  Net 140 ml   Last 3 Weights 10/24/2020 10/22/2020 10/18/2020  Weight (lbs) 151 lb 7.3 oz 151 lb 7.3 oz 179 lb  Weight (kg) 68.7 kg 68.7 kg 81.194 kg      Telemetry    Atrial flutter with variable AV block- Personally Reviewed  ECG    Atrial flutter with 2: 1 AV block- Personally Reviewed  Physical Exam  Pale GEN: No acute distress.   Neck: No JVD Cardiac:  Irregular, no murmurs,  rubs, or gallops.  Respiratory: Clear to auscultation bilaterally. GI: Soft, nontender, non-distended  MS: No edema; No deformity.  Basal cell carcinoma left wrist Neuro:  Nonfocal  Psych: Normal affect   Labs    High Sensitivity Troponin:   Recent Labs  Lab 10/17/20 1446 10/17/20 1646  TROPONINIHS 5 6      Chemistry Recent Labs  Lab 10/23/20 0048 10/24/20 0316 10/27/20 0133 10/28/20 0020 10/29/20 0111  NA 134*   < > 135 133* 134*  K 3.9   < > 5.5* 4.6 4.1  CL 103   < > 105 103 101  CO2 24   < > 20* 24 27  GLUCOSE 108*   < > 113* 130* 120*  BUN 20   < > 34* 37* 31*  CREATININE 1.12   < > 2.41* 2.37* 1.46*  CALCIUM 8.6*   < > 9.1 8.7* 8.8*  PROT 6.0*  --   --   --   --   ALBUMIN 2.3*  --   --   --   --   AST 15  --   --   --   --   ALT 19  --   --   --   --   ALKPHOS 63  --   --   --   --  BILITOT 1.2  --   --   --   --   GFRNONAA >60   < > 27* 28* 50*  ANIONGAP 7   < > 10 6 6    < > = values in this interval not displayed.     Hematology Recent Labs  Lab 10/27/20 0551 10/28/20 0020 10/29/20 0111  WBC 11.4* 8.1 5.3  RBC 3.22* 3.18* 3.20*  HGB 7.9* 7.6* 7.6*  HCT 25.1* 25.0* 25.0*  MCV 78.0* 78.6* 78.1*  MCH 24.5* 23.9* 23.8*  MCHC 31.5 30.4 30.4  RDW 21.1* 21.5* 22.1*  PLT 72* 76* 84*    BNPNo results for input(s): BNP, PROBNP in the last 168 hours.   DDimer No results for input(s): DDIMER in the last 168 hours.   Radiology    DG Abd Portable 1V  Result Date: 10/29/2020 CLINICAL DATA:  Abdominal pain and distension following abdominal surgery EXAM: PORTABLE ABDOMEN - 1 VIEW COMPARISON:  Portable exam 1030 hours compared to CT abdomen and pelvis 10/22/2020 FINDINGS: Ostomy RIGHT lower quadrant. Surgical drain in pelvis. Numerous loops of air-filled mildly distended small bowel. Increased stool in RIGHT colon. Findings most likely represent postoperative ileus. No acute osseous findings or urinary tract calcification. IMPRESSION: Probable  postoperative ileus. Electronically Signed   By: Lavonia Dana M.D.   On: 10/29/2020 10:43    Cardiac Studies   Echo 10/19/20  1. Left ventricular ejection fraction, by estimation, is 65 to 70%. The left ventricle has normal function. The left ventricle has no regional wall motion abnormalities. Left ventricular diastolic function could not be evaluated. 2. Right ventricular systolic function is normal. The right ventricular size is mildly enlarged. There is normal pulmonary artery systolic pressure. The estimated right ventricular systolic pressure is 47.0 mmHg. 3. 31 mm mosaic prosthetic valve with MG 7 mmHG @ 130 bpm. EOA 2.32 cm2. DI 1.6. Normal prosthesis with higher gradients due to tachycardia. The mitral valve has been repaired/replaced. No evidence of mitral valve regurgitation. There is a 31 mm Medtronic Mosaic bioprosthetic valve present in the mitral position. Procedure Date: 07/14/2016. 4. The aortic valve is tricuspid. Aortic valve regurgitation is not visualized. No aortic stenosis is present. 5. The inferior vena cava is normal in size with greater than 50% respiratory variability, suggesting right atrial pressure of 3 mmHg.   Patient Profile     75 y.o. 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  Assessment & Plan    1.  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  Adequately rate controlled.  Plans for cardioversion on hold after diagnosis of colon cancer and surgery.  He has mild thrombocytopenia, but this appears to be showing some signs of improvement and he has not had any bleeding complications.   Hemoglobin is stable in the 7.5 range.  Would consider cardioversion after 3 weeks of  uninterrupted direct oral anticoagulant.  Whether or not he is a good long-term candidate for anticoagulation remains debatable, keeping in mind his poor compliance with follow-up. Rate is well controlled on medications.  He is eating better, we will place him back on oral metoprolol. 2. AKI: Improving with better hydration.  Rapidly returning to baseline. 3.  Thrombocytopenia: Also noted during his hospitalization 2018.  Etiology unclear, although at that time it could have been due  to active endocarditis.  No history of bleeding problems. 4.  Sigmoid colon adenocarcinoma: Extensive pelvic lymphadenopathy" tumor stuck to pelvic sidewall with rind of tissue remaining at the completion of surgery", also suspicious lung nodules, will need systemic therapy.  Has been seen by Dr. Benay Spice, oncology.     For questions or updates, please contact [place] Please consult [email or web address] for contact info under        Signed, Sanda Klein, MD  10/29/2020, 11:08 AM

## 2020-10-29 NOTE — Addendum Note (Signed)
Addendum  created 10/29/20 0734 by Wilburn Cornelia, CRNA   Order list changed

## 2020-10-29 NOTE — Progress Notes (Signed)
Progress Note  5 Days Post-Op  Subjective: Patient lethargic this AM but easily awakens and answers questions appropriately. He denies nausea or vomiting. I spoke with his RN who reports he vomited once early yesterday AM but had a lot of gas from stoma yesterday. He ambulated with PT/OT yesterday. He reports abdominal pain, RN reports he became very drowsy with 5 mg oxycodone yesterday. Has been able to void since foley removed.   Objective: Vital signs in last 24 hours: Temp:  [98 F (36.7 C)-98.6 F (37 C)] 98.3 F (36.8 C) (05/31 0339) Pulse Rate:  [78-108] 78 (05/31 0339) Resp:  [17-19] 18 (05/31 0339) BP: (106-123)/(64-80) 106/64 (05/31 0339) SpO2:  [90 %-93 %] 91 % (05/31 0339) Last BM Date: 10/24/20  Intake/Output from previous day: 05/30 0701 - 05/31 0700 In: 940 [P.O.:940] Out: 330 [Urine:300; Drains:30] Intake/Output this shift: No intake/output data recorded.  PE: General: pleasant, WD, chronically ill appearing male who is laying in bed in NAD Heart: regular, rate, and rhythm.  Lungs: CTAB, no wheezes, rhonchi, or rales noted.  Respiratory effort nonlabored Abd: soft, appropriately ttp, mildly distended, +BS, midline wound appears clean, drain with SS fluid, stoma in LLQ slightly dusky with serous fluid in bag    Lab Results:  Recent Labs    10/28/20 0020 10/29/20 0111  WBC 8.1 5.3  HGB 7.6* 7.6*  HCT 25.0* 25.0*  PLT 76* 84*   BMET Recent Labs    10/28/20 0020 10/29/20 0111  NA 133* 134*  K 4.6 4.1  CL 103 101  CO2 24 27  GLUCOSE 130* 120*  BUN 37* 31*  CREATININE 2.37* 1.46*  CALCIUM 8.7* 8.8*   PT/INR No results for input(s): LABPROT, INR in the last 72 hours. CMP     Component Value Date/Time   NA 134 (L) 10/29/2020 0111   NA 139 01/29/2020 1548   K 4.1 10/29/2020 0111   CL 101 10/29/2020 0111   CO2 27 10/29/2020 0111   GLUCOSE 120 (H) 10/29/2020 0111   BUN 31 (H) 10/29/2020 0111   BUN 34 (H) 01/29/2020 1548   CREATININE 1.46  (H) 10/29/2020 0111   CALCIUM 8.8 (L) 10/29/2020 0111   PROT 6.0 (L) 10/23/2020 0048   PROT 7.3 01/29/2020 1548   ALBUMIN 2.3 (L) 10/23/2020 0048   ALBUMIN 3.9 01/29/2020 1548   AST 15 10/23/2020 0048   ALT 19 10/23/2020 0048   ALKPHOS 63 10/23/2020 0048   BILITOT 1.2 10/23/2020 0048   BILITOT 0.4 01/29/2020 1548   GFRNONAA 50 (L) 10/29/2020 0111   GFRAA 79 01/29/2020 1548   Lipase  No results found for: LIPASE     Studies/Results: No results found.  Anti-infectives: Anti-infectives (From admission, onward)   Start     Dose/Rate Route Frequency Ordered Stop   10/24/20 0600  cefoTEtan (CEFOTAN) 2 g in sodium chloride 0.9 % 100 mL IVPB        2 g 200 mL/hr over 30 Minutes Intravenous To [facility] 10/23/20 2031 10/24/20 1025       Assessment/Plan A fib/flutter - per cards H/O MVR - echo is stable  COPD/tobacco abuse - smokes pipe Basal cell carcinoma x2 of LUE - currently undergoing radiation therapy DM - per primary H/O HTN ABL anemia - secondary to sigmoid colon mass, hgb stable at 7.6 Thrombocytopenia - unclear etiology. [id number] today, improving slowly  Severe Protein Calorie Malnutrition - prealbumin 6.5 this AM  Partially obstructing adenocarcinoma of sigmoid colon POD5 S/P  sigmoid colectomy with end colostomy 10/24/20 Dr. Donne Hazel - pt reportedly had emesis x1 yesterday AM but since has had gas from stoma - check KUB this AM - may need TPN  - continue BID WTD to midline wound - drain with ss fluid - continue for now and trend OP - PT/OT, encourage mobilization  - monitor stoma output, WOC following for colostomy teaching  - foley removed yesterday  - surgical path still pending   AKI - Cr 1.46 today, improving  FEN -FLD, IVF KVO VTE -SCDs, SQ heparin   ID -cefotetan pre-op  LOS: 12 days    Norm Parcel, [facility] 10/29/2020, 9:56 AM Please see Amion for pager number during day hours 7:00am-4:30pm

## 2020-10-29 NOTE — Progress Notes (Addendum)
PROGRESS NOTE    [name]  QVZ:[id number] DOB: [birth year] DOA: 10/17/2020 PCP: Pcp, No   Brief Narrative:  75 years old male with PMH significant for bacterial endocarditis s/p mitral valve replacement in 2018, DVT, DM, HTN, dementia, tobacco use, COPD, atrial fibrillation, sarcoidosis, basal cell carcinoma who was transferred from echo lab to the ER with heart rate in 140s.  Patient denies any symptoms.  Patient states that he is unaware that his heart rate is elevated.  EKG shows atrial flutter with RVR.  Patient is also hypotensive.  He has not followed up with his regular doctor in several years.  Patient was seen by cardiologist and was started on digoxin and Cardizem p.o.  Apparently Cardizem IV drip dropped his blood pressure drastically.  Plan was to do TEE and DC cardioversion however over the weekend patient had guaiac positive stools and GI was consulted and patient had EGD and colonoscopy done on 10/21/2020 which showed a possible partially obstructing mass in the sigmoid colon.  Allergy confirms adenocarcinoma.  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 Benay Spice --no stool output yet Plan: --cont Full liquid, advance per surgery --avoid opioids pain med to prevent ileus  --drain management per surgery --start Miralax BID scheduled  AKI, not POA --developed after surgery, likely ATN --s/p MIVF@74 ,  stopped on 5/29 since it didn't improve Cr and pt tolerating oral hydration Plan: --encourage oral hydration --Hold further IVF  Atypical Aflutter with RVR -Heart rate difficult to control in the setting of soft blood pressure  -Due to lower GI bleed is not a candidate for cardioversion. -TSH wnl --on digoxin 0.125 mg daily, cardizem 120 mg daily, Toprol 50 mg daily --digoxin d/c'ed, per card due to renal insuff Plan: --cont cardizem and Toprol, per card  History of mitral valve replacement and infective endocarditis  - His ejection fraction is 65 to 70%.  Normal left ventricular function and no regional wall motion abnormalities.  Chronic blood loss anemia Severe iron deficiency anemia  -This is secondary to partially obstructing sigmoid adenocarcinoma . -Received IV iron .  Anemia panel consistent with severe iron deficiency anemia -s/p 1u pRBC for Hemoglobin is 7.4  - Appreciate GI input colonoscopy showed likely malignant partially obstructing tumor in the sigmoid colon which was biopsied. - EGD was essentially normal. Plan: --Monitor Hgb and transfuse to keep Hgb >7 --cont iron supplement  COPD --cont daily bronchodilator  Current smoker -cessation advised.  He is not ready to give up smoking yet. --cont nicotine patch  basal cell carcinoma  --left hand covered with dressing.  Acute on chronic thrombocytopenia -with iron deficiency and guaiac positive stools with nonobstructing sigmoid mass with pending biopsy.  Patient has chronic thrombocytopenia which is worsened since going on anticoagulation.   --HIT panel neg Plan: --monitor plt  Hyperkalemia, resolved --s/p lokelma 5 mg BID --d/c Lokelma today  Severe malnutrition in context of chronic illness --supplements per dietician  Estimated body mass index is 20.54 kg/m as calculated from the following:   Height as of this encounter: 6' (1.829 m).   Weight as of this encounter: 68.7 kg.  DVT prophylaxis:  heparin subQ Code Status: Full code Family Communication:   disposition Plan:  Status is: Inpatient  Dispo: The patient is from: Home              Anticipated d/c is to: Home              Patient currently is not medically stable to d/c.  Need to have stool output    Difficult to place patient No    Consultants:   Cardiology   GI  General surgery  Procedures: EGD/colonoscopy 5/23  Antimicrobials: None  Subjective: Still having no stool output.  No N/V.  Voiding normally after Foley removed yesterday.   Objective: Vitals:   10/28/20 2005 10/29/20 0339 10/29/20 1143 10/29/20 1322  BP: 123/75 106/64 119/78 122/80  Pulse: (!) 108 78 99 (!) 108  Resp: 19 18 17 18   Temp: 98 F (36.7 C) 98.3 F (36.8 C)  97.9 F (36.6 C)  TempSrc: Oral Axillary  Oral  SpO2: 90% 91% 92% 94%  Weight:      Height:        Intake/Output Summary (Last 24 hours) at 10/29/2020 1929 Last data filed at 10/29/2020 1848 Gross per 24 hour  Intake 780 ml  Output 1180 ml  Net -400 ml   Filed Weights   10/18/20 0451 10/22/20 1550 10/24/20 0846  Weight: 81.2 kg 68.7 kg 68.7 kg    Examination:  Constitutional: NAD, AAOx3 HEENT: conjunctivae and lids normal, EOMI CV: No cyanosis.   RESP: normal respiratory effort, on 2L GI: active BS, no stool in ostomy bag, abdominal drain with ss output Extremities: No effusions, edema in BLE SKIN: warm, dry Neuro: II - XII grossly intact.   Psych: Normal mood and affect.  Appropriate judgement and reason   Data Reviewed: I have personally reviewed following labs and imaging studies  CBC: Recent Labs  Lab 10/25/20 0047 10/26/20 0234 10/27/20 0551 10/28/20 0020 10/29/20 0111  WBC 9.4 13.5* 11.4* 8.1 5.3  HGB 8.9* 8.1* 7.9* 7.6* 7.6*  HCT 28.5* 26.4* 25.1* 25.0* 25.0*  MCV 76.8* 78.6* 78.0* 78.6* 78.1*  PLT 84* 75* 72* 76* 84*   Basic Metabolic Panel: Recent Labs  Lab 10/25/20 0047 10/25/20 1529 10/26/20 0234 10/27/20 0133  10/28/20 0020 10/29/20 0111  NA 137 135 134* 135 133* 134*  K 5.6* 5.0 5.3* 5.5* 4.6 4.1  CL 103 103 103 105 103 101  CO2 26 21* 20* 20* 24 27  GLUCOSE 144* 131* 113* 113* 130* 120*  BUN 30* 37* 36* 34* 37* 31*  CREATININE 1.86* 2.57* 2.44* 2.41* 2.37* 1.46*  CALCIUM 8.8* 8.7* 8.5* 9.1 8.7* 8.8*  MG 2.0  --  1.9 2.1 2.1 2.0   GFR: Estimated Creatinine Clearance: 41.8 mL/min (A) (by C-G formula based on SCr of 1.46 mg/dL (H)). Liver Function Tests: Recent Labs  Lab 10/23/20 0048  AST 15  ALT 19  ALKPHOS 63  BILITOT 1.2  PROT 6.0*  ALBUMIN 2.3*   No results for input(s): LIPASE, AMYLASE in the last 168 hours. No results for input(s): AMMONIA in the last 168 hours. Coagulation Profile: No results for input(s): INR, PROTIME in the last 168 hours. Cardiac Enzymes: No results for input(s): CKTOTAL, CKMB, CKMBINDEX, TROPONINI in the last 168 hours. BNP (last 3 results)  No results for input(s): PROBNP in the last 8760 hours. HbA1C: No results for input(s): HGBA1C in the last 72 hours. CBG: Recent Labs  Lab 10/23/20 2126 10/24/20 0615 10/24/20 0818 10/24/20 0840 10/24/20 1248  GLUCAP 130* 98 99 93 131*   Lipid Profile: No results for input(s): CHOL, HDL, LDLCALC, TRIG, CHOLHDL, LDLDIRECT in the last 72 hours. Thyroid Function Tests: No results for input(s): TSH, T4TOTAL, FREET4, T3FREE, THYROIDAB in the last 72 hours. Anemia Panel: No results for input(s): VITAMINB12, FOLATE, FERRITIN, TIBC, IRON, RETICCTPCT in the last 72 hours. Sepsis Labs: No results for input(s): PROCALCITON, LATICACIDVEN in the last 168 hours.  Recent Results (from the past 240 hour(s))  Surgical pcr screen     Status: Abnormal   Collection Time: 10/24/20 12:35 AM   Specimen: Nasal Mucosa; Nasal Swab  Result Value Ref Range Status   MRSA, PCR NEGATIVE NEGATIVE Final   Staphylococcus aureus POSITIVE (A) NEGATIVE Final    Comment: (NOTE) The Xpert SA Assay (FDA approved for NASAL specimens in  patients 46 years of age and older), is one component of a comprehensive surveillance program. It is not intended to diagnose infection nor to guide or monitor treatment. Performed at [facility], [place] [address], [place], [place] [address]          Radiology Studies: DG Abd Portable 1V  Result Date: 10/29/2020 CLINICAL DATA:  Abdominal pain and distension following abdominal surgery EXAM: PORTABLE ABDOMEN - 1 VIEW COMPARISON:  Portable exam 1030 hours compared to CT abdomen and pelvis 10/22/2020 FINDINGS: Ostomy RIGHT lower quadrant. Surgical drain in pelvis. Numerous loops of air-filled mildly distended small bowel. Increased stool in RIGHT colon. Findings most likely represent postoperative ileus. No acute osseous findings or urinary tract calcification. IMPRESSION: Probable postoperative ileus. Electronically Signed   By: Lavonia Dana M.D.   On: 10/29/2020 10:43        Scheduled Meds: . sodium chloride   Intravenous Once  . sodium chloride   Intravenous Once  . acetaminophen  650 mg Oral Q6H  . diltiazem  120 mg Oral QHS  . feeding supplement  1 Container Oral BID BM  . feeding supplement  237 mL Oral TID BM  . heparin injection (subcutaneous)  5,000 Units Subcutaneous Q8H  . iron polysaccharides  150 mg Oral Daily  . methocarbamol  500 mg Oral TID  . metoprolol tartrate  50 mg Oral BID  . nicotine  14 mg Transdermal Daily  . polyethylene glycol  34 g Oral BID  . umeclidinium bromide  1 puff Inhalation Daily   Continuous Infusions:    LOS: 12 days   Enzo Bi, MD  10/29/2020, 7:29 PM

## 2020-10-30 ENCOUNTER — Other Ambulatory Visit: Payer: Self-pay

## 2020-10-30 DIAGNOSIS — Z953 Presence of xenogenic heart valve: Secondary | ICD-10-CM | POA: Diagnosis not present

## 2020-10-30 DIAGNOSIS — C44619 Basal cell carcinoma of skin of left upper limb, including shoulder: Secondary | ICD-10-CM | POA: Diagnosis not present

## 2020-10-30 DIAGNOSIS — I4892 Unspecified atrial flutter: Secondary | ICD-10-CM | POA: Diagnosis not present

## 2020-10-30 DIAGNOSIS — I484 Atypical atrial flutter: Secondary | ICD-10-CM | POA: Diagnosis not present

## 2020-10-30 LAB — BASIC METABOLIC PANEL
Anion gap: 7 (ref 5–15)
BUN: 26 mg/dL — ABNORMAL HIGH (ref 8–23)
CO2: 26 mmol/L (ref 22–32)
Calcium: 8.8 mg/dL — ABNORMAL LOW (ref 8.9–10.3)
Chloride: 103 mmol/L (ref 98–111)
Creatinine, Ser: 1.1 mg/dL (ref 0.61–1.24)
GFR, Estimated: >60 mL/min (ref >60)
Glucose, Bld: 110 mg/dL — ABNORMAL HIGH (ref 70–99)
Potassium: 4.1 mmol/L (ref 3.5–5.1)
Sodium: 136 mmol/L (ref 135–145)

## 2020-10-30 LAB — CBC
HCT: 26 % — ABNORMAL LOW (ref 39.0–52.0)
Hemoglobin: 8.2 g/dL — ABNORMAL LOW (ref 13.0–17.0)
MCH: 24.4 pg — ABNORMAL LOW (ref 26.0–34.0)
MCHC: 31.5 g/dL (ref 30.0–36.0)
MCV: 77.4 fL — ABNORMAL LOW (ref 80.0–100.0)
Platelets: 90 10*3/uL — ABNORMAL LOW (ref 150–400)
RBC: 3.36 MIL/uL — ABNORMAL LOW (ref 4.22–5.81)
RDW: 22.2 % — ABNORMAL HIGH (ref 11.5–15.5)
WBC: 5.9 10*3/uL (ref 4.0–10.5)
nRBC: 0.5 % — ABNORMAL HIGH (ref 0.0–0.2)

## 2020-10-30 LAB — MAGNESIUM: Magnesium: 2 mg/dL (ref 1.7–2.4)

## 2020-10-30 NOTE — Progress Notes (Signed)
Progress Note  6 Days Post-Op  Subjective: Patient denies nausea this AM. Reports he did not sleep well overnight. Denies poorly controlled pain this AM. He is asking when he might be able to go home.   Objective: Vital signs in last 24 hours: Temp:  [97.9 F (36.6 C)-98.2 F (36.8 C)] 97.9 F (36.6 C) (06/01 0549) Pulse Rate:  [81-108] 81 (06/01 0549) Resp:  [17-18] 17 (06/01 0549) BP: (116-122)/(74-80) 116/74 (06/01 0549) SpO2:  [92 %-94 %] 93 % (06/01 0549) Last BM Date: 10/24/20  Intake/Output from previous day: 05/31 0701 - 06/01 0700 In: 480 [P.O.:480] Out: 880 [Urine:800; Drains:80] Intake/Output this shift: No intake/output data recorded.  PE: General: pleasant, WD, chronically ill appearing male who is laying in bed in NAD Heart: regular, rate, and rhythm.  Lungs: CTAB, no wheezes, rhonchi, or rales noted.  Respiratory effort nonlabored Abd: soft, appropriately ttp, mildly distended, +BS, midline wound appears clean, drain with SS fluid, large amount of soft brown stool in ostomy appliance   Lab Results:  Recent Labs    10/29/20 0111 10/30/20 0112  WBC 5.3 5.9  HGB 7.6* 8.2*  HCT 25.0* 26.0*  PLT 84* 90*   BMET Recent Labs    10/29/20 0111 10/30/20 0112  NA 134* 136  K 4.1 4.1  CL 101 103  CO2 27 26  GLUCOSE 120* 110*  BUN 31* 26*  CREATININE 1.46* 1.10  CALCIUM 8.8* 8.8*   PT/INR No results for input(s): LABPROT, INR in the last 72 hours. CMP     Component Value Date/Time   NA 136 10/30/2020 0112   NA 139 01/29/2020 1548   K 4.1 10/30/2020 0112   CL 103 10/30/2020 0112   CO2 26 10/30/2020 0112   GLUCOSE 110 (H) 10/30/2020 0112   BUN 26 (H) 10/30/2020 0112   BUN 34 (H) 01/29/2020 1548   CREATININE 1.10 10/30/2020 0112   CALCIUM 8.8 (L) 10/30/2020 0112   PROT 6.0 (L) 10/23/2020 0048   PROT 7.3 01/29/2020 1548   ALBUMIN 2.3 (L) 10/23/2020 0048   ALBUMIN 3.9 01/29/2020 1548   AST 15 10/23/2020 0048   ALT 19 10/23/2020 0048    ALKPHOS 63 10/23/2020 0048   BILITOT 1.2 10/23/2020 0048   BILITOT 0.4 01/29/2020 1548   GFRNONAA >60 10/30/2020 0112   GFRAA 79 01/29/2020 1548   Lipase  No results found for: LIPASE     Studies/Results: DG Abd Portable 1V  Result Date: 10/29/2020 CLINICAL DATA:  Abdominal pain and distension following abdominal surgery EXAM: PORTABLE ABDOMEN - 1 VIEW COMPARISON:  Portable exam 1030 hours compared to CT abdomen and pelvis 10/22/2020 FINDINGS: Ostomy RIGHT lower quadrant. Surgical drain in pelvis. Numerous loops of air-filled mildly distended small bowel. Increased stool in RIGHT colon. Findings most likely represent postoperative ileus. No acute osseous findings or urinary tract calcification. IMPRESSION: Probable postoperative ileus. Electronically Signed   By: Lavonia Dana M.D.   On: 10/29/2020 10:43    Anti-infectives: Anti-infectives (From admission, onward)   Start     Dose/Rate Route Frequency Ordered Stop   10/24/20 0600  cefoTEtan (CEFOTAN) 2 g in sodium chloride 0.9 % 100 mL IVPB        2 g 200 mL/hr over 30 Minutes Intravenous To [facility] 10/23/20 2031 10/24/20 1025       Assessment/Plan A fib/flutter - per cards H/O MVR - echo is stable  COPD/tobacco abuse - smokes pipe, per primary  Basal cell carcinoma x2 of  LUE - currently undergoing radiation therapy DM - per primary H/O HTN ABL anemia - secondary to sigmoid colon mass, hgb stable at 8.2 Thrombocytopenia - unclear etiology. 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 Donne Hazel -large amount of stool out from stoma this AM - advance to soft diet, continue BID miralax, large amount of stool seen in R colon on KUB yesterday - continue BID WTD to midline wound - drain with ss fluid - likely remove prior to  discharge - PT/OT, encourage mobilization  - WOC following for colostomy teaching  - foley removed and patient urinating  - surgical path confirms adenocarcinoma, no apparent nodal spread - ONC following  - may be ready for discharge from a surgical standpoint in the next few days if tolerating diet and continuing to have bowel function   AKI - Cr1.1 today, continues to improve  FEN -soft diet, IVF KVO VTE -SCDs, SQ heparin   ID -cefotetan pre-op  LOS: 13 days    Norm Parcel, [facility] 10/30/2020, 8:35 AM Please see Amion for pager number during day hours 7:00am-4:30pm

## 2020-10-30 NOTE — Progress Notes (Signed)
Progress Note  Patient Name: [name] Date of Encounter: 10/30/2020  [facility] Cardiologist: Donato Heinz, MD   Subjective   Poor sleep , but otw no complaints. Remains in well-controlled atypical atrial flutter.  Inpatient Medications    Scheduled Meds: . sodium chloride   Intravenous Once  . sodium chloride   Intravenous Once  . acetaminophen  650 mg Oral Q6H  . diltiazem  120 mg Oral QHS  . feeding supplement  1 Container Oral BID BM  . feeding supplement  237 mL Oral TID BM  . heparin injection (subcutaneous)  5,000 Units Subcutaneous Q8H  . iron polysaccharides  150 mg Oral Daily  . methocarbamol  500 mg Oral TID  . metoprolol tartrate  50 mg Oral BID  . nicotine  14 mg Transdermal Daily  . polyethylene glycol  34 g Oral BID  . umeclidinium bromide  1 puff Inhalation Daily   Continuous Infusions:  PRN Meds: alum & mag hydroxide-simeth, guaiFENesin-dextromethorphan, levalbuterol, morphine injection, oxyCODONE, phenol   Vital Signs    Vitals:   10/29/20 1143 10/29/20 1322 10/29/20 2039 10/30/20 0549  BP: 119/78 122/80 122/74 116/74  Pulse: 99 (!) 108 82 81  Resp: 17 18 18 17   Temp:  97.9 F (36.6 C) 98.2 F (36.8 C) 97.9 F (36.6 C)  TempSrc:  Oral Oral   SpO2: 92% 94% 94% 93%  Weight:      Height:        Intake/Output Summary (Last 24 hours) at 10/30/2020 1012 Last data filed at 10/30/2020 0900 Gross per 24 hour  Intake 480 ml  Output 1230 ml  Net -750 ml   Last 3 Weights 10/24/2020 10/22/2020 10/18/2020  Weight (lbs) 151 lb 7.3 oz 151 lb 7.3 oz 179 lb  Weight (kg) 68.7 kg 68.7 kg 81.194 kg      Telemetry    AFlutter, 80-90s - Personally Reviewed  ECG    No new tracing - Personally Reviewed  Physical Exam  Pale GEN: No acute distress.   Neck: No JVD Cardiac: irregular, no murmurs, rubs, or gallops.  Respiratory: Clear to auscultation bilaterally. GI: Soft, nontender, non-distended  MS: No edema; No deformity. Basal  cell carcinoma left wrist Neuro:  Nonfocal  Psych: Normal affect   Labs    High Sensitivity Troponin:   Recent Labs  Lab 10/17/20 1446 10/17/20 1646  TROPONINIHS 5 6      Chemistry Recent Labs  Lab 10/28/20 0020 10/29/20 0111 10/30/20 0112  NA 133* 134* 136  K 4.6 4.1 4.1  CL 103 101 103  CO2 24 27 26   GLUCOSE 130* 120* 110*  BUN 37* 31* 26*  CREATININE 2.37* 1.46* 1.10  CALCIUM 8.7* 8.8* 8.8*  GFRNONAA 28* 50* >60  ANIONGAP 6 6 7      Hematology Recent Labs  Lab 10/28/20 0020 10/29/20 0111 10/30/20 0112  WBC 8.1 5.3 5.9  RBC 3.18* 3.20* 3.36*  HGB 7.6* 7.6* 8.2*  HCT 25.0* 25.0* 26.0*  MCV 78.6* 78.1* 77.4*  MCH 23.9* 23.8* 24.4*  MCHC 30.4 30.4 31.5  RDW 21.5* 22.1* 22.2*  PLT 76* 84* 90*    BNPNo results for input(s): BNP, PROBNP in the last 168 hours.   DDimer No results for input(s): DDIMER in the last 168 hours.   Radiology    DG Abd Portable 1V  Result Date: 10/29/2020 CLINICAL DATA:  Abdominal pain and distension following abdominal surgery EXAM: PORTABLE ABDOMEN - 1 VIEW COMPARISON:  Portable  exam 1030 hours compared to CT abdomen and pelvis 10/22/2020 FINDINGS: Ostomy RIGHT lower quadrant. Surgical drain in pelvis. Numerous loops of air-filled mildly distended small bowel. Increased stool in RIGHT colon. Findings most likely represent postoperative ileus. No acute osseous findings or urinary tract calcification. IMPRESSION: Probable postoperative ileus. Electronically Signed   By: Lavonia Dana M.D.   On: 10/29/2020 10:43    Cardiac Studies    Echo 10/19/20  1. Left ventricular ejection fraction, by estimation, is 65 to 70%. The left ventricle has normal function. The left ventricle has no regional wall motion abnormalities. Left ventricular diastolic function could not be evaluated. 2. Right ventricular systolic function is normal. The right ventricular size is mildly enlarged. There is normal pulmonary artery systolic pressure. The estimated  right ventricular systolic pressure is 02.7 mmHg. 3. 31 mm mosaic prosthetic valve with MG 7 mmHG @ 130 bpm. EOA 2.32 cm2. DI 1.6. Normal prosthesis with higher gradients due to tachycardia. The mitral valve has been repaired/replaced. No evidence of mitral valve regurgitation. There is a 31 mm [employer]onic Mosaic bioprosthetic valve present in the mitral position. Procedure Date: 07/14/2016. 4. The aortic valve is tricuspid. Aortic valve regurgitation is not visualized. No aortic stenosis is present. 5. The inferior vena cava is normal in size with greater than 50% respiratory variability, suggesting right atrial pressure of 3 mmHg.    Patient Profile     75 y.o. 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  Assessment & Plan    1.  Aflutter: Atypical, probably atriotomy scar reentry.  Consider cardioversion after at least 3 weeks of uninterrupted oral anticoagulant. 2. S/P MVR: normal bioprosthesis function and nomal LVEF on echo. 3. AKI: resolved 4.  Anemia/Thrombocytopenia: Both are slowly improving. 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 Tb-penia was also noted during his hospitalization 2018.  Etiology unclear, although at that time it could have been due to active endocarditis.  No history of bleeding problems. 4.  Sigmoid colon adenocarcinoma: Extensive pelvic lymphadenopathy" tumor stuck to pelvic sidewall with rind of tissue remaining at the completion of surgery", also suspicious lung nodules, will need systemic therapy.  Has been seen by Dr. Benay Spice, oncology.     [facility] will sign off.   Medication Recommendations:   - metoprolol 50 mg twice daily - diltiazem CD 120 mg once  daily - Eliquis 5 mg twice daily (start when Ok'd by surgery) Other recommendations (labs, testing, etc):  Recheck Hgb and platelets in 1-2 weeks Follow up as an outpatient:  Will schedule appt in 3 weeks.  For questions or updates, please contact [place] Please consult [email or web address] for contact info under        Signed, Sanda Klein, MD  10/30/2020, 10:12 AM

## 2020-10-30 NOTE — Progress Notes (Addendum)
Pt vomited x1 with approx 197ml greenish liquid with some undigested food. Notified on call doctor  G. Shalhoub.

## 2020-10-30 NOTE — Progress Notes (Signed)
Occupational Therapy Treatment Patient Details Name: [name] MRN: [id number] DOB: 1946/02/09 Today's Date: 10/30/2020    History of present illness 75 y.o. male transferred from echo lab to ER on 5/19 when noted to have HR in 140s, with rhythm of atrial flutter and asymptomatic. Chest Abdomen Pelvis CT shows large colonic mass in the sigmoid colon with pelvic lymphadenopathy and small pulmonary nodules indicative of metastatic disease, potential pneumonia of L lower lobe, and mild paraseptal emphysema. Pt is now s/p sigmoid colectomy with end colostomy and cystoscopy with bil ureteral stents on 10/25/20. 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   OT comments  Pt progressing well with OT goals. This session pt did not want to get OOB, however was agreeable to exercises seated EOB, afterwards he agreed to complete 2 sit to stands to readjust in bed and get more comfortable. OT will continue to follow up to assist pt in becoming independent with ADL's and functional mobility.   Follow Up Recommendations  Supervision - Intermittent    Equipment Recommendations  None recommended by OT    Recommendations for Other Services      Precautions / Restrictions Precautions Precautions: Fall Precaution Comments: ostomy Restrictions Weight Bearing Restrictions: No       Mobility Bed Mobility Overal bed mobility: Modified Independent                  Transfers Overall transfer level: Needs assistance Equipment used: None Transfers: Sit to/from Stand Sit to Stand: Supervision         General transfer comment: Performed sit<>stand x2 no physical assist'    Balance Overall balance assessment: Mild deficits observed, not formally tested                                         ADL either performed or assessed  with clinical judgement   ADL Overall ADL's : Needs assistance/impaired                         Toilet Transfer: Psychologist, counselling Details (indicate cue type and reason): Pt simulated transferring on and off bed.         Functional mobility during ADLs: Supervision/safety General ADL Comments: Pt requested not to get OOB initially, then agreed to short mobility standing from bed and taking a few steps     Vision       Perception     Praxis      Cognition Arousal/Alertness: Awake/alert Behavior During Therapy: WFL for tasks assessed/performed Overall Cognitive Status: History of cognitive impairments - at baseline                                          Exercises Exercises: Other exercises Other Exercises Other Exercises: Punch outs, Seated, Both UE, 10 reps Other Exercises: Lat raises, Seated, BUE, 10 reps Other Exercises: Bicep Curls, Seated, BUE, 10 reps Other Exercises: Marching, Seated, BLE, 10 Reps Other Exercises: Kickouts, seated, BLE, 10 reps   Shoulder Instructions       General Comments VSS on RA    Pertinent Vitals/ Pain       Pain Assessment: No/denies pain  Home Living                                          Prior Functioning/Environment              Frequency  Min 2X/week        Progress Toward Goals  OT Goals(current goals can now be found in the care plan section)  Progress towards OT goals: Progressing toward goals  Acute Rehab OT Goals Patient Stated Goal: return home OT Goal Formulation: With patient Time For Goal Achievement: 11/09/20 Potential to Achieve Goals: Good ADL Goals Pt Will Perform Upper Body Dressing: Independently Pt Will Perform Lower Body Dressing: Independently Pt Will Transfer to Toilet: Independently  Plan Discharge plan remains appropriate;Frequency remains appropriate    Co-evaluation                 AM-PAC OT  "6 Clicks" Daily Activity     Outcome Measure   Help from another person eating meals?: None Help from another person taking care of personal grooming?: None Help from another person toileting, which includes using toliet, bedpan, or urinal?: None Help from another person bathing (including washing, rinsing, drying)?: None Help from another person to put on and taking off regular upper body clothing?: None Help from another person to put on and taking off regular lower body clothing?: None 6 Click Score: 24    End of Session    OT Visit Diagnosis: Unsteadiness on feet (R26.81);Other abnormalities of gait and mobility (R26.89);Pain Pain - Right/Left: Left   Activity Tolerance Patient tolerated treatment well   Patient Left in bed;with bed alarm set;with call bell/phone within reach   Nurse Communication Mobility status        Time: 1548-1600 OT Time Calculation (min): 12 min  Charges: OT General Charges $OT Visit: 1 Visit OT Treatments $Self Care/Home Management : 8-22 mins  Breezie Micucci H., OTR/L Acute Rehabilitation  Daren Doswell Elane Zanasia Hickson 10/30/2020, 5:23 PM

## 2020-10-30 NOTE — Progress Notes (Signed)
HEMATOLOGY-ONCOLOGY PROGRESS NOTE  SUBJECTIVE: He continues to have abdominal pain when lying on his side.  No other complaint.  PHYSICAL EXAMINATION:  Vitals:   10/24/20 2133 10/25/20 0539  BP: 119/83 115/80  Pulse: 100 (!) 109  Resp: 17 18  Temp: 97.6 F (36.4 C) 98.1 F (36.7 C)  SpO2: 98% 95%   Filed Weights   10/18/20 0451 10/22/20 1550 10/24/20 0846  Weight: 81.2 kg 68.7 kg 68.7 kg    Intake/Output from previous day: 05/26 0701 - 05/27 0700 In: 2553.1 [P.O.:30; I.V.:1415; Blood:608; IV Piggyback:500.1] Out: 1170 [Urine:575; Drains:195; Blood:400 ABDOMEN: Left lower abdomen colostomy with a small amount of brown stool, midline incision with a gauze dressing, right lower quadrant drain with a serosanguineous drainage Vascular: No leg edema  LABORATORY DATA:  I have reviewed the data as listed CMP Latest Ref Rng & Units 10/25/2020 10/24/2020 10/24/2020  Glucose 70 - 99 mg/dL 144(H) - -  BUN 8 - 23 mg/dL 30(H) - -  Creatinine 0.61 - 1.24 mg/dL 1.86(H) - -  Sodium 135 - 145 mmol/L 137 138 138  Potassium 3.5 - 5.1 mmol/L 5.6(H) 4.0 4.1  Chloride 98 - 111 mmol/L 103 - -  CO2 22 - 32 mmol/L 26 - -  Calcium 8.9 - 10.3 mg/dL 8.8(L) - -  Total Protein 6.5 - 8.1 g/dL - - -  Total Bilirubin 0.3 - 1.2 mg/dL - - -  Alkaline Phos 38 - 126 U/L - - -  AST 15 - 41 U/L - - -  ALT 0 - 44 U/L - - -    Lab Results  Component Value Date   WBC 9.4 10/25/2020   HGB 8.9 (L) 10/25/2020   HCT 28.5 (L) 10/25/2020   MCV 76.8 (L) 10/25/2020   PLT 84 (L) 10/25/2020    DG Chest 2 View  Result Date: 10/17/2020 CLINICAL DATA:  Chest pain, shortness of breath, extreme fatigue EXAM: CHEST - 2 VIEW COMPARISON:  None. FINDINGS: Cardiomediastinal silhouette and pulmonary vasculature are within normal limits. Median sternotomy changes are seen. The third wire is fractured. Prosthetic valve is seen. Lungs are hyperexpanded, but otherwise clear clear. IMPRESSION: No acute cardiopulmonary process.  Electronically Signed   By: Miachel Roux M.D.   On: 10/17/2020 15:46   DG Retrograde Pyelogram  Result Date: 10/24/2020 CLINICAL DATA:  75 year old male with colorectal cancer . EXAM: RETROGRADE PYELOGRAM COMPARISON:  CT chest, abdomen and pelvis 10/22/2020 FINDINGS: A total of 3 intraoperative saved images obtained during bilateral double-J ureteral stent placement are submitted for review. The images demonstrate a catheter in the bladder with contrast opacification of the bladder. Partial bilateral nephroureterograms. A ureteral stent is partially visualized on the left. No focal abnormality of the visualized portions of either ureter. IMPRESSION: Ureteropyelogram with placement of bilateral ureteral stents. Electronically Signed   By: Jacqulynn Cadet M.D.   On: 10/24/2020 11:14   CT CHEST ABDOMEN PELVIS W CONTRAST  Result Date: 10/22/2020 CLINICAL DATA:  75 year old male with history of colorectal cancer. Staging examination. EXAM: CT CHEST, ABDOMEN, AND PELVIS WITH CONTRAST TECHNIQUE: Multidetector CT imaging of the chest, abdomen and pelvis was performed following the standard protocol during bolus administration of intravenous contrast. CONTRAST:  14mL OMNIPAQUE IOHEXOL 300 MG/ML  SOLN COMPARISON:  No priors. FINDINGS: CT CHEST FINDINGS Cardiovascular: Heart size is normal. There is no significant pericardial fluid, thickening or pericardial calcification. Aortic atherosclerosis. No definite coronary artery calcifications. Status post median sternotomy for mitral valve replacement. Mediastinum/Nodes: No pathologically  enlarged mediastinal or hilar lymph nodes. Esophagus is unremarkable in appearance. No axillary lymphadenopathy. Lungs/Pleura: Rounded area of airspace consolidation in the posterior aspect of the left lower lobe (axial image 125 of series 5), likely a focus of infection. Small pulmonary nodules are noted measuring 6 mm in the inferior aspect of the lingula (axial image 101 of series  5), 5 mm in the periphery of the left lower lobe (axial image 72 of series 5), and 5 mm in the posterior aspect of the right lower lobe (axial image 94 of series 5). Areas of linear scarring or atelectasis are noted in the upper lobes of the lungs bilaterally. Mild paraseptal emphysema. Musculoskeletal: Median sternotomy wires. There are no aggressive appearing lytic or blastic lesions noted in the visualized portions of the skeleton. CT ABDOMEN PELVIS FINDINGS Hepatobiliary: Multiple subcentimeter low-attenuation lesions scattered throughout the hepatic parenchyma, too small to characterize, but statistically likely to represent cysts. The larger of the low-attenuation hepatic lesions are compatible with simple cysts, measuring up to 11 mm in the periphery of segment 4A. No other suspicious hepatic lesions. No intra or extrahepatic biliary ductal dilatation. Gallbladder is normal in appearance. Pancreas: No pancreatic mass. No pancreatic ductal dilatation. No pancreatic or peripancreatic fluid collections or inflammatory changes. Spleen: Unremarkable. Adrenals/Urinary Tract: 1.5 cm low-attenuation lesion in the posterior aspect of the interpolar region of the left kidney, compatible with a simple cyst. Right kidney and bilateral adrenal glands are normal in appearance. No hydroureteronephrosis. Urinary bladder is normal in appearance. Stomach/Bowel: The appearance of the stomach is normal. There is no pathologic dilatation of small bowel or colon. Mass-like area of mural thickening in the sigmoid colon estimated to measure approximately 10.4 x 6.3 x 6.3 cm (axial image 108 of series 3 and coronal image 55 of series 6). Focal direct extension of malignant appearing soft tissue posteriorly from the lesion (axial image 106 of series 3) into the sigmoid mesocolon. The appendix is not confidently identified and may be surgically absent. Regardless, there are no inflammatory changes noted adjacent to the cecum to suggest  the presence of an acute appendicitis at this time. Vascular/Lymphatic: Aortic atherosclerosis, without evidence of aneurysm or dissection in the abdominal or pelvic vasculature. Enlarged right external iliac lymph node measuring 1.5 cm in short axis (axial image 106 of series 3). Several other prominent but nonenlarged lymph nodes are noted in the pelvis, largest of which measures up to 6 mm in the low left anterior pelvis (axial image 113 of series 3). Enlarged right common iliac lymph node (axial image 97 of series 3) measuring 1.6 cm in short axis. Reproductive: Prostate gland and seminal vesicles are unremarkable in appearance. Other: No significant volume of ascites.  No pneumoperitoneum. Musculoskeletal: There are no aggressive appearing lytic or blastic lesions noted in the visualized portions of the skeleton. IMPRESSION: 1. Large colonic mass estimated to measure approximately 10.4 x 6.3 x 6.3 cm in the sigmoid colon with pelvic lymphadenopathy indicative of metastatic disease, as above. Small pulmonary nodules are also noted, which could conceivably represent metastatic lesions as well. Attention on follow-up imaging is recommended to assess for the stability of these nodules. 2. Airspace consolidation in the left lower lobe concerning for potential pneumonia. 3. Aortic atherosclerosis. 4. Mild paraseptal emphysema. 5. Additional incidental findings, as above. Electronically Signed   By: Vinnie Langton M.D.   On: 10/22/2020 18:54   DG CHEST PORT 1 VIEW  Result Date: 10/24/2020 CLINICAL DATA:  Postop central line placement EXAM:  PORTABLE CHEST 1 VIEW COMPARISON:  10/17/2020 FINDINGS: Right jugular catheter is been placed with the tip in the mid SVC. No pneumothorax Median sternotomy. Negative for heart failure. Underlying chronic lung disease. No acute infiltrate or effusion. IMPRESSION: Central line placement into the SVC.  No pneumothorax. Electronically Signed   By: Franchot Gallo M.D.   On:  10/24/2020 13:45   ECHOCARDIOGRAM COMPLETE  Result Date: 10/19/2020    ECHOCARDIOGRAM REPORT   Patient Name:   [name] Date of Exam: 10/19/2020 Medical Rec #:  [id number]        Height:       72.0 in Accession #:    [id number]       Weight:       179.0 lb Date of Birth:  Jan 11, 1946        BSA:          2.032 m Patient Age:    25 years         BP:           93/61 mmHg Patient Gender: M                HR:           133 bpm. Exam Location:  Inpatient Procedure: 2D Echo, 3D Echo, Cardiac Doppler and Color Doppler Indications:    Abnormal EKG  History:        Patient has prior history of Echocardiogram examinations, most                 recent 07/24/2016. COPD, Signs/Symptoms:Shortness of Breath; Risk                 Factors:Current Smoker and Hypertension. History of                 endocarditis.                  Mitral Valve: 31 mm Medtronic Mosaic bioprosthetic valve valve                 is present in the mitral position. Procedure Date: 07/14/2016.  Sonographer:    Darlina Sicilian RDCS Referring Phys: [phone] BYRON Ravensworth  Sonographer Comments: Global longitudinal strain was attempted. IMPRESSIONS  1. Left ventricular ejection fraction, by estimation, is 65 to 70%. The left ventricle has normal function. The left ventricle has no regional wall motion abnormalities. Left ventricular diastolic function could not be evaluated.  2. Right ventricular systolic function is normal. The right ventricular size is mildly enlarged. There is normal pulmonary artery systolic pressure. The estimated right ventricular systolic pressure is 10.3 mmHg.  3. 31 mm mosaic prosthetic valve with MG 7 mmHG @ 130 bpm. EOA 2.32 cm2. DI 1.6. Normal prosthesis with higher gradients due to tachycardia. The mitral valve has been repaired/replaced. No evidence of mitral valve regurgitation. There is a 31 mm Medtronic Mosaic bioprosthetic valve present in the mitral position. Procedure Date: 07/14/2016.  4. The aortic valve is tricuspid.  Aortic valve regurgitation is not visualized. No aortic stenosis is present.  5. The inferior vena cava is normal in size with greater than 50% respiratory variability, suggesting right atrial pressure of 3 mmHg. FINDINGS  Left Ventricle: Left ventricular ejection fraction, by estimation, is 65 to 70%. The left ventricle has normal function. The left ventricle has no regional wall motion abnormalities. The left ventricular internal cavity size was normal in size. There is  no left ventricular hypertrophy. Left ventricular diastolic function  could not be evaluated due to mitral valve replacement. Left ventricular diastolic function could not be evaluated. Right Ventricle: The right ventricular size is mildly enlarged. No increase in right ventricular wall thickness. Right ventricular systolic function is normal. There is normal pulmonary artery systolic pressure. The tricuspid regurgitant velocity is 2.46  m/s, and with an assumed right atrial pressure of 3 mmHg, the estimated right ventricular systolic pressure is 84.6 mmHg. Left Atrium: Left atrial size was normal in size. Right Atrium: Right atrial size was normal in size. Pericardium: Trivial pericardial effusion is present. Mitral Valve: 31 mm mosaic prosthetic valve with MG 7 mmHG @ 130 bpm. EOA 2.32 cm2. DI 1.6. Normal prosthesis with higher gradients due to tachycardia. The mitral valve has been repaired/replaced. No evidence of mitral valve regurgitation. There is a 31 mm Medtronic Mosaic bioprosthetic valve present in the mitral position. Procedure Date: 07/14/2016. MV peak gradient, 12.2 mmHg. The mean mitral valve gradient is 7.0 mmHg. Tricuspid Valve: The tricuspid valve is grossly normal. Tricuspid valve regurgitation is mild . No evidence of tricuspid stenosis. Aortic Valve: The aortic valve is tricuspid. Aortic valve regurgitation is not visualized. No aortic stenosis is present. Pulmonic Valve: The pulmonic valve was grossly normal. Pulmonic valve  regurgitation is not visualized. No evidence of pulmonic stenosis. Aorta: The aortic root and ascending aorta are structurally normal, with no evidence of dilitation. Venous: The inferior vena cava is normal in size with greater than 50% respiratory variability, suggesting right atrial pressure of 3 mmHg. IAS/Shunts: The atrial septum is grossly normal.  LEFT VENTRICLE PLAX 2D LVIDd:         3.50 cm LVIDs:         2.40 cm LV PW:         0.90 cm LV IVS:        0.90 cm LVOT diam:     2.20 cm LV SV:         49 LV SV Index:   24 LVOT Area:     3.80 cm  RIGHT VENTRICLE RV S prime:     9.03 cm/s TAPSE (M-mode): 1.3 cm LEFT ATRIUM             Index       RIGHT ATRIUM           Index LA diam:        2.80 cm 1.38 cm/m  RA Area:     16.00 cm LA Vol (A2C):   16.8 ml 8.27 ml/m  RA Volume:   44.60 ml  21.94 ml/m LA Vol (A4C):   32.1 ml 15.79 ml/m LA Biplane Vol: 25.2 ml 12.40 ml/m  AORTIC VALVE LVOT Vmax:   98.40 cm/s LVOT Vmean:  67.900 cm/s LVOT VTI:    0.128 m  AORTA Ao Root diam: 3.70 cm Ao Asc diam:  3.30 cm MITRAL VALVE             TRICUSPID VALVE MV Area VTI:  2.32 cm   TR Peak grad:   24.2 mmHg MV Peak grad: 12.2 mmHg  TR Vmax:        246.00 cm/s MV Mean grad: 7.0 mmHg MV Vmax:      1.75 m/s   SHUNTS MV Vmean:     122.0 cm/s Systemic VTI:  0.13 m MV VTI:       0.21 m     Systemic Diam: 2.20 cm Eleonore Chiquito MD Electronically signed by Eleonore Chiquito MD Signature Date/Time: 10/19/2020/1:35:36 PM  Final     ASSESSMENT AND PLAN: 1.  Sigmoid colon adenocarcinoma -Colonoscopy 10/21/2020- partially obstructing mass found in the sigmoid colon consistent with adenocarcinoma. -CEA on 10/22/2020 was 3.9 -CTs 10/22/2020-colonic mass with pelvic lymphadenopathy including right common iliac and external iliac nodes, small pulmonary nodules -10/24/2020-sigmoid colectomy, end colostomy, tumor stuck to pelvic sidewall with rind of tissue remaining at completion of surgery -Pathology- moderately differentiated adenocarcinoma  the sigmoid colon, tumor extends into pericolonic connective tissue, no lymphovascular perineural invasion, 0/14 lymph nodes, carcinoma involves an area with the specimen was disrupted at mesenteric margin 2.  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 3.  Thrombocytopenia 4.  Atrial flutter 5.  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  COPD 7.  Hypertension 8.  Diabetes mellitus 9.  Dementia 10.  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 Martin Majestic who is going to check with some Mohs specialist to see if disease is resectable, they were holding on radiation appointments until this could be explored. -Established care with the wound center on 09/13/2019  [name] continues to recover from the colectomy procedure.  I reviewed the pathology results with him today.  He has been diagnosed with stage II colon cancer, though the mesenteric margin is in question.  There is also the possibility of metastatic disease involving pelvic lymph nodes and lung nodules.  I will clarify the surgical margin with Dr. Donne Hazel.  We will arrange for outpatient follow-up at the Cancer center and plan for a restaging CT or PET as an outpatient.  I will present his case at the GI tumor conference.  Recommendations: 1.  Continue postoperative care per surgery 2.   Anticoagulation therapy as recommended by cardiology 3.  Outpatient follow-up at the Cancer center will be scheduled for 2 to 3 weeks 4.  Please call Oncology as needed   Julieanne Manson, MD

## 2020-10-30 NOTE — Progress Notes (Signed)
Physical Therapy Treatment Patient Details Name: [name] MRN: [id number] DOB: 04/28/1946 Today's Date: 10/30/2020    History of Present Illness 75 y.o. male transferred from echo lab to ER on 5/19 when noted to have HR in 140s, with rhythm of atrial flutter and asymptomatic. Chest Abdomen Pelvis CT shows large colonic mass in the sigmoid colon with pelvic lymphadenopathy and small pulmonary nodules indicative of metastatic disease, potential pneumonia of L lower lobe, and mild paraseptal emphysema. Pt is now s/p sigmoid colectomy with end colostomy and cystoscopy with bil ureteral stents on 10/25/20. 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    PT Comments    Pt making good progress.  Demonstrates safe gait and transfers with supervision without AD.  Pt does not have safety awareness or desire for rollator at this time and is able to ambulate household distances with rest breaks.  Continue to progress as able.    Follow Up Recommendations  No PT follow up;Supervision for mobility/OOB     Equipment Recommendations  None recommended by PT (pt declined rollator)    Recommendations for Other Services       Precautions / Restrictions Precautions Precautions: Fall Precaution Comments: ostomy    Mobility  Bed Mobility Overal bed mobility: Modified Independent Bed Mobility: Supine to Sit;Sit to Supine     Supine to sit: Modified independent (Device/Increase time);HOB elevated Sit to supine: Modified independent (Device/Increase time);HOB elevated        Transfers Overall transfer level: Needs assistance   Transfers: Sit to/from Stand Sit to Stand: Supervision         General transfer comment: Performed sit to stand safely from bed and bench.  When sitting on rollator therapist had to lock brakes even with  cues.  Ambulation/Gait Ambulation/Gait assistance: Supervision Gait Distance (Feet): 50 Feet (10', 50', 30' , 30') Assistive device: 4-wheeled walker;None Gait Pattern/deviations: Step-through pattern Gait velocity: functional   General Gait Details: Pt ambulating in room without AD and demonstrating safe gait with supervision.  Tried with rollator for energy conservation/rest breaks.  Pt walking well with rollator but even with cues therapist still had to lock brakes for pt to sit for rest breaks.  Pt states he would not use at home and does not go out.  Pt ambulated several bouts with seated rest breaks.   Stairs Stairs: Yes Stairs assistance: Min guard Stair Management: One rail Left;Alternating pattern;Forwards Number of Stairs: 4 General stair comments: performed safely   Wheelchair Mobility    Modified Rankin (Stroke Patients Only)       Balance Overall balance assessment: Needs assistance Sitting-balance support: No upper extremity supported Sitting balance-Leahy Scale: Good     Standing balance support: No upper extremity supported;During functional activity Standing balance-Leahy Scale: Good Standing balance comment: Pt performing toileting ADLs, washing hands, combing hair, and brushing teeth without UE support                            Cognition Arousal/Alertness: Awake/alert Behavior During Therapy: WFL for tasks assessed/performed Overall Cognitive Status: History of cognitive impairments - at baseline                                 General Comments: Follows commands but does have decreased safety awareness and memory.  Unable to  lock brakes on rollator to sit even with repeated cues - therapist had to lock      Exercises      General Comments General comments (skin integrity, edema, etc.): HR up to 118 with ambulation      Pertinent Vitals/Pain Pain Assessment: Faces Faces Pain Scale: Hurts a little bit Pain Location:  abdomen with movement Pain Descriptors / Indicators: Grimacing;Discomfort Pain Intervention(s): Limited activity within patient's tolerance    Home Living                      Prior Function            PT Goals (current goals can now be found in the care plan section) Acute Rehab PT Goals Patient Stated Goal: return home PT Goal Formulation: With patient/family Time For Goal Achievement: 11/08/20 Potential to Achieve Goals: Good Progress towards PT goals: Progressing toward goals    Frequency    Min 3X/week      PT Plan Current plan remains appropriate    Co-evaluation              AM-PAC PT "6 Clicks" Mobility   Outcome Measure  Help needed turning from your back to your side while in a flat bed without using bedrails?: None Help needed moving from lying on your back to sitting on the side of a flat bed without using bedrails?: None Help needed moving to and from a bed to a chair (including a wheelchair)?: A Little Help needed standing up from a chair using your arms (e.g., wheelchair or bedside chair)?: A Little Help needed to walk in hospital room?: A Little Help needed climbing 3-5 steps with a railing? : A Little 6 Click Score: 20    End of Session Equipment Utilized During Treatment: Gait belt Activity Tolerance: Patient tolerated treatment well Patient left: in bed;with call bell/phone within reach;with bed alarm set;with family/visitor present Nurse Communication: Mobility status PT Visit Diagnosis: Other abnormalities of gait and mobility (R26.89)     Time: 1202-1222 PT Time Calculation (min) (ACUTE ONLY): 20 min  Charges:  $Gait Training: 8-22 mins                     Abran Richard, PT Acute Rehab Services Pager [phone] [facility] [phone]     Karlton Lemon 10/30/2020, 12:33 PM

## 2020-10-30 NOTE — Progress Notes (Signed)
PROGRESS NOTE    [name]  NTZ:[id number] DOB: [birth year] DOA: 10/17/2020 PCP: Pcp, No   Brief Narrative:  75 years old male with PMH significant for bacterial endocarditis s/p mitral valve replacement in 2018, DVT, DM, HTN, dementia, tobacco use, COPD, atrial fibrillation, sarcoidosis, basal cell carcinoma who was transferred from echo lab to the ER with heart rate in 140s and hypotensive.  He was diagnosed with atrial flutter with RVR. Patient was seen by cardiology and started on digoxin and Cardizem p.o.  Apparently Cardizem IV drip dropped his blood pressure drastically. Plan was to do TEE and DC cardioversion however over the weekend patient had guaiac positive stools and GI was consulted and patient had EGD and colonoscopy done on 10/21/2020 which showed a possible partially obstructing mass in the sigmoid colon.  S/p sigmoid colectomy with colostomy on 2/26 by general surgery.  Pathology confirms adenocarcinoma.  Oncology consulted.  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  Tolerating liquid diet and advancing to soft diet today.  Having large amount of loose/liquid stool output via colostomy.  WOC following for colostomy teaching.  Drain management per surgery.  Now that pathology confirms adenocarcinoma, will reach out to Dr. Benay Spice, oncology regarding follow-up.  Pain management.  AKI, not POA --developed after surgery, likely ATN --Resolved after IVF.  Continue to trend daily  BMP.  Atypical Aflutter with RVR -Heart rate difficult to control in the setting of soft blood pressure  -Earlier, due to lower GI bleed is not a candidate for cardioversion.  However now that he has had sigmoid colectomy, as per cardiology recommendations, start Eliquis when okayed by surgery. -TSH wnl --Rate controlled on metoprolol 50 Mg twice daily and Cardizem CD 120 Mg daily. -Cardiology signed off 6/1 and they will schedule appointment in 3 weeks.  History of bioprosthetic mitral valve replacement and infective endocarditis  -As per cardiology, normal bioprosthesis function and normal LVEF on echo.  Chronic blood loss anemia Severe iron deficiency anemia/thrombocytopenia -Anemia was secondary to partially obstructing sigmoid adenocarcinoma .  Etiology of thrombocytopenia unclear. -Received IV iron .  Anemia panel consistent with severe iron deficiency anemia -s/p 1u pRBC  - GI evaluation and management as noted above - EGD was essentially normal. -Hemoglobin up to 8.2 today.  Continue to trend daily CBCs and transfuse if hemoglobin 7 g or less.  Platelet count seem to be gradually improving.  COPD --cont daily bronchodilator.  No clinical bronchospasm.  Current smoker -cessation advised.  He is not ready to give up smoking yet. --cont nicotine patch  Basal cell carcinoma  --left hand covered with dressing.  As per oncology, appears to have advanced basal cell carcinoma of the left wrist.  Acute on chronic thrombocytopenia -with iron deficiency and guaiac positive stools with nonobstructing sigmoid mass with pending biopsy.  Patient has chronic thrombocytopenia which seem to have worsened.  Currently on DVT prophylactic dose heparin. --HIT panel neg -Platelet counts seem to be gradually improving  Hyperkalemia,  resolved --s/p lokelma 5 mg BID  Severe malnutrition in context of chronic illness/Body mass index is 20.54 kg/m. --supplements per dietician    DVT  prophylaxis: heparin subQ Code Status: Full code Family Communication: None at bedside.  Disposition Plan:  Status is: Inpatient  Dispo: The patient is from: Home              Anticipated d/c is to: Home              Patient currently is not medically stable to d/c.     Consultants:   Cardiology   GI  General surgery  Medical oncology  Procedures:  EGD/colonoscopy 5/23 Sigmoid colectomy with end colostomy on 5/26  Pathology:  From 10/21/2020: FINAL MICROSCOPIC DIAGNOSIS:   A. COLON, SIGMOID MASS, BIOPSY:  - Adenocarcinoma.  - See comment.   From 10/24/2020 FINAL MICROSCOPIC DIAGNOSIS:   A. COLON, SIGMOID, COLECTOMY:  - Invasive colorectal adenocarcinoma.  - Carcinoma extends into pericolonic connective tissue.  - Fourteen lymph nodes negative for metastatic carcinoma.  - See oncology table and comment.   Antimicrobials: None  Subjective: Seen this morning along with patient's RN in the room.  Reports hiccups.  Asking how long it may take to fully recover.  Reports some appropriate abdominal pain/soreness postop.  Asking for diet advancement.  Large volume colostomy output-RN emptying soft brown stools at bedside.   Objective: Vitals:   10/29/20 1322 10/29/20 2039 10/30/20 0549 10/30/20 1339  BP: 122/80 122/74 116/74 113/71  Pulse: (!) 108 82 81 78  Resp: 18 18 17 17   Temp: 97.9 F (36.6 C) 98.2 F (36.8 C) 97.9 F (36.6 C) (!) 97.4 F (36.3 C)  TempSrc: Oral Oral  Oral  SpO2: 94% 94% 93% 92%  Weight:      Height:        Intake/Output Summary (Last 24 hours) at 10/30/2020 1558 Last data filed at 10/30/2020 1319 Gross per 24 hour  Intake --  Output 1370 ml  Net -1370 ml   Filed Weights   10/18/20 0451 10/22/20 1550 10/24/20 0846  Weight: 81.2 kg 68.7 kg 68.7 kg    Examination:  General exam: Elderly male, moderately built and poorly nourished lying comfortably propped up in bed without distress.  Some bitemporal wasting. Respiratory system:  Clear to auscultation. Respiratory effort normal. Cardiovascular system: S1 & S2 heard, irregularly irregular. No JVD, murmurs, rubs, gallops or clicks. No pedal edema.  Telemetry personally reviewed: Atrial flutter with variable but controlled ventricular rate. Gastrointestinal system: Abdomen is nondistended.  Midline surgical dressing clean and dry.  Left colostomy with liquid brown stools.  Mild diffuse tenderness without rigidity, guarding or rebound.  Normal bowel sounds heard.  No organomegaly or masses appreciated. Central nervous system: Alert and oriented x2. No focal neurological deficits. Extremities: Symmetric 5 x 5 power. Skin: No rashes, lesions or ulcers Psychiatry: Judgement and insight appear normal. Mood & affect appropriate.      Data Reviewed: I have personally reviewed following labs and imaging studies  CBC: Recent Labs  Lab 10/26/20 0234 10/27/20 0551 10/28/20 0020 10/29/20 0111 10/30/20 0112  WBC 13.5* 11.4* 8.1 5.3 5.9  HGB 8.1* 7.9* 7.6* 7.6* 8.2*  HCT 26.4* 25.1* 25.0* 25.0* 26.0*  MCV 78.6* 78.0* 78.6* 78.1* 77.4*  PLT 75* 72* 76* 84* 90*   Basic Metabolic Panel: Recent Labs  Lab 10/26/20 0234 10/27/20 0133 10/28/20 0020 10/29/20 0111 10/30/20 0112  NA 134* 135 133* 134* 136  K 5.3* 5.5* 4.6  4.1 4.1  CL 103 105 103 101 103  CO2 20* 20* 24 27 26   GLUCOSE 113* 113* 130* 120* 110*  BUN 36* 34* 37* 31* 26*  CREATININE 2.44* 2.41* 2.37* 1.46* 1.10  CALCIUM 8.5* 9.1 8.7* 8.8* 8.8*  MG 1.9 2.1 2.1 2.0 2.0   GFR: Estimated Creatinine Clearance: 55.5 mL/min (by C-G formula based on SCr of 1.1 mg/dL).   Recent Results (from the past 240 hour(s))  Surgical pcr screen     Status: Abnormal   Collection Time: 10/24/20 12:35 AM   Specimen: Nasal Mucosa; Nasal Swab  Result Value Ref Range Status   MRSA, PCR NEGATIVE NEGATIVE Final   Staphylococcus aureus POSITIVE (A) NEGATIVE Final    Comment: (NOTE) The Xpert SA Assay (FDA approved for NASAL  specimens in patients 37 years of age and older), is one component of a comprehensive surveillance program. It is not intended to diagnose infection nor to guide or monitor treatment. Performed at [facility], [place] [address] [place], [place] [address]          Radiology Studies: DG Abd Portable 1V  Result Date: 10/29/2020 CLINICAL DATA:  Abdominal pain and distension following abdominal surgery EXAM: PORTABLE ABDOMEN - 1 VIEW COMPARISON:  Portable exam 1030 hours compared to CT abdomen and pelvis 10/22/2020 FINDINGS: Ostomy RIGHT lower quadrant. Surgical drain in pelvis. Numerous loops of air-filled mildly distended small bowel. Increased stool in RIGHT colon. Findings most likely represent postoperative ileus. No acute osseous findings or urinary tract calcification. IMPRESSION: Probable postoperative ileus. Electronically Signed   By: Lavonia Dana M.D.   On: 10/29/2020 10:43        Scheduled Meds: . sodium chloride   Intravenous Once  . sodium chloride   Intravenous Once  . acetaminophen  650 mg Oral Q6H  . diltiazem  120 mg Oral QHS  . feeding supplement  1 Container Oral BID BM  . feeding supplement  237 mL Oral TID BM  . heparin injection (subcutaneous)  5,000 Units Subcutaneous Q8H  . iron polysaccharides  150 mg Oral Daily  . methocarbamol  500 mg Oral TID  . metoprolol tartrate  50 mg Oral BID  . nicotine  14 mg Transdermal Daily  . polyethylene glycol  34 g Oral BID  . umeclidinium bromide  1 puff Inhalation Daily   Continuous Infusions:    LOS: 13 days   Vernell Leep, MD, [place], [facility]. [facility]  To contact the attending provider between 7A-7P or the covering provider during after hours 7P-7A, please log into the web site [email or web address] and access using universal [place] password for that web site. If you do not have the password, please call the hospital operator.

## 2020-10-31 DIAGNOSIS — I484 Atypical atrial flutter: Secondary | ICD-10-CM | POA: Diagnosis not present

## 2020-10-31 DIAGNOSIS — Z953 Presence of xenogenic heart valve: Secondary | ICD-10-CM | POA: Diagnosis not present

## 2020-10-31 LAB — BASIC METABOLIC PANEL
Anion gap: 7 (ref 5–15)
BUN: 27 mg/dL — ABNORMAL HIGH (ref 8–23)
CO2: 28 mmol/L (ref 22–32)
Calcium: 8.6 mg/dL — ABNORMAL LOW (ref 8.9–10.3)
Chloride: 103 mmol/L (ref 98–111)
Creatinine, Ser: 1.03 mg/dL (ref 0.61–1.24)
GFR, Estimated: >60 mL/min (ref >60)
Glucose, Bld: 106 mg/dL — ABNORMAL HIGH (ref 70–99)
Potassium: 4.3 mmol/L (ref 3.5–5.1)
Sodium: 138 mmol/L (ref 135–145)

## 2020-10-31 LAB — CBC
HCT: 26.7 % — ABNORMAL LOW (ref 39.0–52.0)
Hemoglobin: 8.2 g/dL — ABNORMAL LOW (ref 13.0–17.0)
MCH: 24 pg — ABNORMAL LOW (ref 26.0–34.0)
MCHC: 30.7 g/dL (ref 30.0–36.0)
MCV: 78.3 fL — ABNORMAL LOW (ref 80.0–100.0)
Platelets: 95 10*3/uL — ABNORMAL LOW (ref 150–400)
RBC: 3.41 MIL/uL — ABNORMAL LOW (ref 4.22–5.81)
RDW: 22.3 % — ABNORMAL HIGH (ref 11.5–15.5)
WBC: 4.8 10*3/uL (ref 4.0–10.5)
nRBC: 0.4 % — ABNORMAL HIGH (ref 0.0–0.2)

## 2020-10-31 MED ORDER — ONDANSETRON HCL 4 MG/2ML IJ SOLN
4.0000 mg | Freq: Four times a day (QID) | INTRAMUSCULAR | Status: DC | PRN
  Administered 2020-10-31 – 2020-11-01 (×3): 4 mg via INTRAVENOUS
  Filled 2020-10-31 (×3): qty 2

## 2020-10-31 MED ORDER — POLYETHYLENE GLYCOL 3350 17 G PO PACK
34.0000 g | PACK | Freq: Every day | ORAL | Status: DC
  Administered 2020-10-31: 34 g via ORAL
  Filled 2020-10-31: qty 2

## 2020-10-31 MED ORDER — ADULT MULTIVITAMIN W/MINERALS CH
1.0000 | ORAL_TABLET | Freq: Every day | ORAL | Status: DC
  Administered 2020-10-31 – 2020-11-01 (×2): 1 via ORAL
  Filled 2020-10-31 (×2): qty 1

## 2020-10-31 MED ORDER — BOOST PLUS PO LIQD
237.0000 mL | Freq: Three times a day (TID) | ORAL | Status: DC
  Filled 2020-10-31 (×5): qty 237

## 2020-10-31 MED ORDER — APIXABAN 5 MG PO TABS
5.0000 mg | ORAL_TABLET | Freq: Two times a day (BID) | ORAL | Status: DC
  Administered 2020-10-31 – 2020-11-01 (×2): 5 mg via ORAL
  Filled 2020-10-31 (×2): qty 1

## 2020-10-31 NOTE — Progress Notes (Signed)
Remains in a flutter w variable AV block, rates just under 100. Can increase diltiazem to 180 mg daily if VR is over 100.

## 2020-10-31 NOTE — Progress Notes (Signed)
F/u scheduled 6/30 in NL office. Appt info on AVS. Emberlynn Riggan PA-C

## 2020-10-31 NOTE — Progress Notes (Addendum)
PROGRESS NOTE    [name]  BLT:[id number] DOB: [birth year] DOA: 10/17/2020 PCP: Pcp, No   Brief Narrative:  75 years old male with PMH significant for bacterial endocarditis s/p mitral valve replacement in 2018, DVT, DM, HTN, dementia, tobacco use, COPD, atrial fibrillation, sarcoidosis, basal cell carcinoma who was transferred from echo lab to the ER with heart rate in 140s and hypotensive.  He was diagnosed with atrial flutter with RVR. Patient was seen by cardiology and started on digoxin and Cardizem p.o.  Apparently Cardizem IV drip dropped his blood pressure drastically. Plan was to do TEE and DC cardioversion however over the weekend patient had guaiac positive stools and GI was consulted and patient had EGD and colonoscopy done on 10/21/2020 which showed a possible partially obstructing mass in the sigmoid colon.  S/p sigmoid colectomy with colostomy on 2/26 by general surgery.  Pathology confirms adenocarcinoma.  Oncology consulted and will follow up as outpatient post discharge.  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  Diet advanced and tolerating same.  Colostomy functioning well.  WOC following for colostomy teaching.  Surgery removing drain today.  Pathology confirms adenocarcinoma.  Oncology follow-up 6/1 appreciated, indicates stage II colon cancer and they will arrange outpatient follow-up in cancer center in 2 to 3 weeks for further evaluation and management.  AKI, not  POA --developed after surgery, likely ATN -- Resolved.  Atypical Aflutter with RVR -Heart rate difficult to control in the setting of soft blood pressure  -Earlier, due to lower GI bleed is not a candidate for cardioversion.  However now that he has had sigmoid colectomy, as per cardiology recommendations, start Eliquis when okayed by surgery. -TSH wnl --Rate controlled on metoprolol 50 Mg twice daily and Cardizem CD 120 Mg daily. -Cardiology signed off 6/1 and have scheduled follow-up. This morning on telemetry, had mild RVR in the 100-110.  As per cardiology input, can increase diltiazem to 180 daily-we will reassess in a.m.  Surgery has cleared to resume Eliquis.  History of bioprosthetic mitral valve replacement and infective endocarditis  -As per cardiology, normal bioprosthesis function and normal LVEF on echo.  Chronic blood loss anemia Severe iron deficiency anemia/thrombocytopenia -Anemia was secondary to partially obstructing sigmoid adenocarcinoma .  Etiology of thrombocytopenia unclear. -Received IV iron .  Anemia panel consistent with severe iron deficiency anemia -s/p 1u pRBC  - GI evaluation and management as noted above - EGD was essentially normal. -Hemoglobin up to 8.2 >8.2.  Continue to trend daily CBCs and transfuse if hemoglobin 7 g or less.  Platelet count seem to be gradually improving.  COPD --cont daily bronchodilator.  No clinical bronchospasm.  Current smoker -cessation advised.  He is not ready to give up smoking yet. --cont nicotine patch  Basal cell carcinoma  --left hand covered with dressing.  As per oncology, appears to have advanced basal cell carcinoma of the left wrist.  Acute on chronic thrombocytopenia -with iron deficiency and guaiac positive stools with nonobstructing  sigmoid mass with pending biopsy.  Patient has chronic thrombocytopenia which seem to have worsened.  Currently on DVT prophylactic dose heparin. --HIT panel neg -Platelet  counts seem to be gradually improving  Hyperkalemia, resolved --s/p lokelma 5 mg BID  Severe malnutrition in context of chronic illness/Body mass index is 20.54 kg/m. --supplements per dietician    DVT prophylaxis: heparin subQ Code Status: Full code Family Communication: I discussed in detail with patient's son and his son's girlfriend via phone, discussed in detail, updated care and answered all questions.  Disposition Plan:  Status is: Inpatient  Dispo: The patient is from: Home              Anticipated d/c is to: Home hopefully 6/3.              Patient currently is not medically stable to d/c.     Consultants:   Cardiology   GI  General surgery  Medical oncology  Procedures:  EGD/colonoscopy 5/23 Sigmoid colectomy with end colostomy on 5/26  Pathology:  From 10/21/2020: FINAL MICROSCOPIC DIAGNOSIS:   A. COLON, SIGMOID MASS, BIOPSY:  - Adenocarcinoma.  - See comment.   From 10/24/2020 FINAL MICROSCOPIC DIAGNOSIS:   A. COLON, SIGMOID, COLECTOMY:  - Invasive colorectal adenocarcinoma.  - Carcinoma extends into pericolonic connective tissue.  - Fourteen lymph nodes negative for metastatic carcinoma.  - See oncology table and comment.   Antimicrobials: None  Subjective: Hiccups resolved.  Overnight small emesis but tolerated breakfast this morning.  Denies significant abdominal pain.   Objective: Vitals:   10/30/20 1339 10/30/20 2001 10/30/20 2016 10/31/20 0447  BP: 113/71 126/83 126/85 105/65  Pulse: 78 (!) 115 (!) 110 82  Resp: 17  16 16   Temp: (!) 97.4 F (36.3 C) 98.5 F (36.9 C) 98 F (36.7 C) 97.6 F (36.4 C)  TempSrc: Oral Oral Oral Oral  SpO2: 92% 93% 94% 96%  Weight:      Height:        Intake/Output Summary (Last 24 hours) at 10/31/2020 1725 Last data filed at 10/31/2020 0900 Gross per 24 hour  Intake 120 ml  Output 1170 ml  Net -1050 ml   Filed Weights   10/18/20 0451 10/22/20 1550 10/24/20 0846  Weight: 81.2 kg 68.7 kg 68.7  kg    Examination:  General exam: Elderly male, moderately built and poorly nourished lying comfortably propped up in bed without distress.  Some bitemporal wasting. Respiratory system: Clear to auscultation.  No increased work of breathing. Cardiovascular system: S1 and S2 heard, RRR.  No JVD or murmurs.  Telemetry personally reviewed: A. fib with controlled ventricular rate overnight and when seen this morning mild RVR in the 100- 110 range Gastrointestinal system: Abdomen is nondistended.  Midline surgical dressing clean and dry.  Left colostomy with liquid brown stools.  Nontender.  Normal bowel sounds heard.  No organomegaly or masses appreciated. Central nervous system: Alert and oriented x2. No focal neurological deficits. Extremities: Symmetric 5 x 5 power. Skin: No rashes, lesions or ulcers Psychiatry: Judgement and insight appear normal. Mood & affect appropriate.      Data Reviewed: I have personally reviewed following labs and imaging studies  CBC: Recent Labs  Lab 10/27/20 0551 10/28/20 0020 10/29/20 0111 10/30/20 0112 10/31/20 0057  WBC 11.4* 8.1 5.3 5.9 4.8  HGB 7.9* 7.6* 7.6* 8.2* 8.2*  HCT 25.1* 25.0* 25.0* 26.0* 26.7*  MCV 78.0* 78.6* 78.1* 77.4* 78.3*  PLT 72* 76* 84* 90* 95*   Basic  Metabolic Panel: Recent Labs  Lab 10/26/20 0234 10/27/20 0133 10/28/20 0020 10/29/20 0111 10/30/20 0112 10/31/20 0057  NA 134* 135 133* 134* 136 138  K 5.3* 5.5* 4.6 4.1 4.1 4.3  CL 103 105 103 101 103 103  CO2 20* 20* 24 27 26 28   GLUCOSE 113* 113* 130* 120* 110* 106*  BUN 36* 34* 37* 31* 26* 27*  CREATININE 2.44* 2.41* 2.37* 1.46* 1.10 1.03  CALCIUM 8.5* 9.1 8.7* 8.8* 8.8* 8.6*  MG 1.9 2.1 2.1 2.0 2.0  --    GFR: Estimated Creatinine Clearance: 59.3 mL/min (by C-G formula based on SCr of 1.03 mg/dL).   Recent Results (from the past 240 hour(s))  Surgical pcr screen     Status: Abnormal   Collection Time: 10/24/20 12:35 AM   Specimen: Nasal Mucosa; Nasal Swab   Result Value Ref Range Status   MRSA, PCR NEGATIVE NEGATIVE Final   Staphylococcus aureus POSITIVE (A) NEGATIVE Final    Comment: (NOTE) The Xpert SA Assay (FDA approved for NASAL specimens in patients 19 years of age and older), is one component of a comprehensive surveillance program. It is not intended to diagnose infection nor to guide or monitor treatment. Performed at [facility], [place] [address], [place], [place] [address]          Radiology Studies: No results found.      Scheduled Meds: . acetaminophen  650 mg Oral Q6H  . diltiazem  120 mg Oral QHS  . heparin injection (subcutaneous)  5,000 Units Subcutaneous Q8H  . iron polysaccharides  150 mg Oral Daily  . lactose free nutrition  237 mL Oral TID WC  . methocarbamol  500 mg Oral TID  . metoprolol tartrate  50 mg Oral BID  . multivitamin with minerals  1 tablet Oral Daily  . nicotine  14 mg Transdermal Daily  . polyethylene glycol  34 g Oral Daily  . umeclidinium bromide  1 puff Inhalation Daily   Continuous Infusions:    LOS: 14 days   Shawn Leep, MD, [place], [facility]. [facility]  To contact the attending provider between 7A-7P or the covering provider during after hours 7P-7A, please log into the web site [email or web address] and access using universal [place] password for that web site. If you do not have the password, please call the hospital operator.

## 2020-10-31 NOTE — TOC Progression Note (Signed)
Transition of Care [facility]) - Progression Note    Patient Details  Name: [name] MRN: [id number] Date of Birth: [birth year]  [facility] CM/SW Contact  Jacalyn Lefevre Edson Snowball, RN Phone Number: 10/31/2020, 10:45 AM  Clinical Narrative:     Plan to discahrge to home tomorrow. Bedside nurse to teach patient and family dressing changes. WOC for ostomy teaching.   Deirdre Priest with [facility]   Expected Discharge Plan: [place]    Expected Discharge Plan and Services Expected Discharge Plan: [place] Choice: [place] arrangements for the past 2 months: Single Family Home                 DME Arranged: N/A DME Agency: NA       HH Arranged: RN [place] Agency: [place] Date HH Agency Contacted: 10/28/20 Time HH Agency Contacted: 1120 Representative spoke with at [place]: [place] reviewing referral   Social Determinants of Health ([place]) Interventions    Readmission Risk Interventions No flowsheet data found.

## 2020-10-31 NOTE — Progress Notes (Signed)
ANTICOAGULATION CONSULT NOTE - Initial Consult  Pharmacy Consult for apixaban Indication: atrial fibrillation  Allergies  Allergen Reactions  . Orange Juice [Orange Oil] Hives    Patient Measurements: Height: 6' (182.9 cm) Weight: 68.7 kg (151 lb 7.3 oz) IBW/kg (Calculated) : 77.6   Vital Signs:    Labs: Recent Labs    10/29/20 0111 10/30/20 0112 10/31/20 0057  HGB 7.6* 8.2* 8.2*  HCT 25.0* 26.0* 26.7*  PLT 84* 90* 95*  CREATININE 1.46* 1.10 1.03    Estimated Creatinine Clearance: 59.3 mL/min (by C-G formula based on SCr of 1.03 mg/dL).   Medical History: Past Medical History:  Diagnosis Date  . Anemia   . Atrial fibrillation ([place])    post operative in 2018  . Atrial flutter ([place])   . Bacteremia 2018  . Basal cell carcinoma   . COPD (chronic obstructive pulmonary disease) ([place])   . Dementia ([place])   . Diabetes ([place])   . DVT of deep femoral vein ([place])   . Hypertension   . Thrombocytopenia ([place])     Medications:  Medications Prior to Admission  Medication Sig Dispense Refill Last Dose  . aspirin EC 81 MG tablet Take 81 mg by mouth daily. Swallow whole.   10/17/2020 at Unknown time  . furosemide (LASIX) 40 MG tablet Take 40 mg by mouth daily.   10/17/2020 at Unknown time  . Multiple Vitamin (MULTI-VITAMIN DAILY) TABS Take 1 tablet by mouth daily.   10/17/2020 at Unknown time   Scheduled:  . acetaminophen  650 mg Oral Q6H  . diltiazem  120 mg Oral QHS  . iron polysaccharides  150 mg Oral Daily  . lactose free nutrition  237 mL Oral TID WC  . methocarbamol  500 mg Oral TID  . metoprolol tartrate  50 mg Oral BID  . multivitamin with minerals  1 tablet Oral Daily  . nicotine  14 mg Transdermal Daily  . polyethylene glycol  34 g Oral Daily  . umeclidinium bromide  1 puff Inhalation Daily    Assessment: 75 yo male with aflutter (also noted s/p MVR in 2018) to start apiaxaban. He is s/p colectomy on 5/26.  -Hg= 8.2, plt= 95 (recently 70s-80s) -SCr= 1.0, wt   68kg  Goal of Therapy:  Monitor platelets by anticoagulation protocol: Yes   Plan:   -apixaban 5mg  po bid -Will follow patient progress  Hildred Laser, PharmD Clinical Pharmacist **Pharmacist phone directory can now be found on [email or web address] ([email or web address]).  Listed under [place].

## 2020-10-31 NOTE — Progress Notes (Signed)
7 Days Post-Op   Subjective/Chief Complaint: Small emesis overnight but doing great now, has bag full, ambulating, ate breakfast    Objective: Vital signs in last 24 hours: Temp:  [97.4 F (36.3 C)-98.5 F (36.9 C)] 97.6 F (36.4 C) (06/02 0447) Pulse Rate:  [78-115] 82 (06/02 0447) Resp:  [16-17] 16 (06/02 0447) BP: (105-126)/(65-85) 105/65 (06/02 0447) SpO2:  [92 %-96 %] 96 % (06/02 0447) Last BM Date: 10/30/20  Intake/Output from previous day: 06/01 0701 - 06/02 0700 In: 340 [P.O.:340] Out: 1890 [Urine:500; Emesis/NG output:100; Drains:190; EXHBZ:1696] Intake/Output this shift: Total I/O In: 120 [P.O.:120] Out: 100 [Urine:100]  General: pleasant, WD,chronically ill appearingmale who is laying in bed in NAD cv rrr pulmRespiratory effort nonlabored Abd: soft,appropriately ttp, +BS,midline wound appears clean, drain with SS fluid, large amount of soft brown stool in ostomy appliance  Lab Results:  Recent Labs    10/30/20 0112 10/31/20 0057  WBC 5.9 4.8  HGB 8.2* 8.2*  HCT 26.0* 26.7*  PLT 90* 95*   BMET Recent Labs    10/30/20 0112 10/31/20 0057  NA 136 138  K 4.1 4.3  CL 103 103  CO2 26 28  GLUCOSE 110* 106*  BUN 26* 27*  CREATININE 1.10 1.03  CALCIUM 8.8* 8.6*   PT/INR No results for input(s): LABPROT, INR in the last 72 hours. ABG No results for input(s): PHART, HCO3 in the last 72 hours.  Invalid input(s): PCO2, PO2  Studies/Results: DG Abd Portable 1V  Result Date: 10/29/2020 CLINICAL DATA:  Abdominal pain and distension following abdominal surgery EXAM: PORTABLE ABDOMEN - 1 VIEW COMPARISON:  Portable exam 1030 hours compared to CT abdomen and pelvis 10/22/2020 FINDINGS: Ostomy RIGHT lower quadrant. Surgical drain in pelvis. Numerous loops of air-filled mildly distended small bowel. Increased stool in RIGHT colon. Findings most likely represent postoperative ileus. No acute osseous findings or urinary tract calcification. IMPRESSION:  Probable postoperative ileus. Electronically Signed   By: Lavonia Dana M.D.   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 [id number] today, improving slowly  SevereProteinCalorieMalnutrition- prealbumin 6.5 5/31, continue nutritional supplements   Rolm Bookbinder 10/31/2020

## 2020-10-31 NOTE — Progress Notes (Signed)
Nutrition Follow-up  DOCUMENTATION CODES:   Severe malnutrition in context of chronic illness  INTERVENTION:   Encourage smaller, more frequent meals  Plan to route in-patient nutrition notes to outpatient Oncology RD team for follow-up.  Written education on "Colostomy Nutrition Therapy" provided to patient  Add MVI with Minerals  Trial of changing to Boost Plus po TID, each supplement provides 360 kcal and 14 grams of protein   NUTRITION DIAGNOSIS:   Severe Malnutrition related to chronic illness (COPD, new adenocarcinoma of the colon) as evidenced by severe muscle depletion,severe fat depletion,percent weight loss (8.4% weight loss in less than 2 months).  Being addressed via supplements  GOAL:   Patient will meet greater than or equal to 90% of their needs  Being addressed  MONITOR:   PO intake,Supplement acceptance,Diet advancement,Labs,Weight trends,Skin  REASON FOR ASSESSMENT:   Consult (weight loss) Assessment of nutrition requirement/status  ASSESSMENT:   75 year old male who presented to the ED on 5/19 with tachycardia. PMH of bacteria endocarditis s/p MVR in 2018, DVT, DM, HTN, dementia, tobacco use, COPD, atrial fibrillation, basal cell carcinoma on L wrist/hand.  5/26 Sigmoid colectomy with end colostomy  Pathology with adenocarcinoma of sigmoid colon. 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 Pt does not like Boost Breeze or Ensure supplements  Recorded po intake 50% at breakfast this AM but 0-10% of meals yesterday. Recorded po intake 0-75%  +small emesis over night but improved today. Good output from colostomy  Labs: reviewed Meds: reviewed   Diet Order:   Diet Order            DIET SOFT Room service appropriate? Yes; Fluid consistency: Thin  Diet effective now                  EDUCATION NEEDS:   Education needs have been addressed  Skin:  Skin Assessment: Skin Integrity Issues: Skin Integrity Issues:: Other (Comment) Other: basal cell carcinoma x 2 on LUE  Last BM:  6/2 stool via colostomy  Height:   Ht Readings from Last 1 Encounters:  10/24/20 6' (1.829 m)    Weight:   Wt Readings from Last 1 Encounters:  10/24/20 68.7 kg    BMI:  Body mass index is 20.54 kg/m.  Estimated Nutritional Needs:   Kcal:  2000-2200  Protein:  100-115 grams  Fluid:  >/= 2.0 L   Kerman Passey MS, RDN, LDN, CNSC Registered Dietitian III Clinical Nutrition RD Pager and On-Call Pager Number Located in [place]

## 2020-11-01 ENCOUNTER — Other Ambulatory Visit (HOSPITAL_COMMUNITY): Payer: Self-pay

## 2020-11-01 DIAGNOSIS — I484 Atypical atrial flutter: Secondary | ICD-10-CM | POA: Diagnosis not present

## 2020-11-01 DIAGNOSIS — C189 Malignant neoplasm of colon, unspecified: Secondary | ICD-10-CM

## 2020-11-01 DIAGNOSIS — I4891 Unspecified atrial fibrillation: Secondary | ICD-10-CM | POA: Diagnosis not present

## 2020-11-01 LAB — CBC
HCT: 26.9 % — ABNORMAL LOW (ref 39.0–52.0)
Hemoglobin: 8.2 g/dL — ABNORMAL LOW (ref 13.0–17.0)
MCH: 24.1 pg — ABNORMAL LOW (ref 26.0–34.0)
MCHC: 30.5 g/dL (ref 30.0–36.0)
MCV: 79.1 fL — ABNORMAL LOW (ref 80.0–100.0)
Platelets: 94 10*3/uL — ABNORMAL LOW (ref 150–400)
RBC: 3.4 MIL/uL — ABNORMAL LOW (ref 4.22–5.81)
RDW: 22.6 % — ABNORMAL HIGH (ref 11.5–15.5)
WBC: 4.5 10*3/uL (ref 4.0–10.5)
nRBC: 0.4 % — ABNORMAL HIGH (ref 0.0–0.2)

## 2020-11-01 LAB — BASIC METABOLIC PANEL
Anion gap: 5 (ref 5–15)
BUN: 26 mg/dL — ABNORMAL HIGH (ref 8–23)
CO2: 28 mmol/L (ref 22–32)
Calcium: 8.6 mg/dL — ABNORMAL LOW (ref 8.9–10.3)
Chloride: 106 mmol/L (ref 98–111)
Creatinine, Ser: 1.06 mg/dL (ref 0.61–1.24)
GFR, Estimated: >60 mL/min (ref >60)
Glucose, Bld: 109 mg/dL — ABNORMAL HIGH (ref 70–99)
Potassium: 3.9 mmol/L (ref 3.5–5.1)
Sodium: 139 mmol/L (ref 135–145)

## 2020-11-01 LAB — SURGICAL PATHOLOGY

## 2020-11-01 MED ORDER — OXYCODONE HCL 5 MG PO TABS
2.5000 mg | ORAL_TABLET | Freq: Four times a day (QID) | ORAL | 0 refills | Status: DC | PRN
  Filled 2020-11-01: qty 30, 7d supply, fill #0

## 2020-11-01 MED ORDER — METOPROLOL TARTRATE 50 MG PO TABS
50.0000 mg | ORAL_TABLET | Freq: Two times a day (BID) | ORAL | 1 refills | Status: DC
  Filled 2020-11-01: qty 60, 30d supply, fill #0

## 2020-11-01 MED ORDER — ALBUTEROL SULFATE HFA 108 (90 BASE) MCG/ACT IN AERS
1.0000 | INHALATION_SPRAY | Freq: Four times a day (QID) | RESPIRATORY_TRACT | 1 refills | Status: AC | PRN
  Filled 2020-11-01: qty 18, 25d supply, fill #0

## 2020-11-01 MED ORDER — ACETAMINOPHEN 325 MG PO TABS: 650.0000 mg | ORAL_TABLET | Freq: Four times a day (QID) | ORAL | Status: AC | PRN

## 2020-11-01 MED ORDER — LEVALBUTEROL TARTRATE 45 MCG/ACT IN AERO
1.0000 | INHALATION_SPRAY | RESPIRATORY_TRACT | 1 refills | Status: DC | PRN
  Filled 2020-11-01: qty 15, 30d supply, fill #0

## 2020-11-01 MED ORDER — ONDANSETRON HCL 4 MG PO TABS
4.0000 mg | ORAL_TABLET | Freq: Every day | ORAL | 1 refills | Status: DC | PRN
  Filled 2020-11-01: qty 30, 30d supply, fill #0

## 2020-11-01 MED ORDER — NICOTINE 14 MG/24HR TD PT24
14.0000 mg | MEDICATED_PATCH | Freq: Every day | TRANSDERMAL | 0 refills | Status: DC
  Filled 2020-11-01: qty 28, 28d supply, fill #0

## 2020-11-01 MED ORDER — APIXABAN 5 MG PO TABS
5.0000 mg | ORAL_TABLET | Freq: Two times a day (BID) | ORAL | 1 refills | Status: DC
  Filled 2020-11-01: qty 60, 30d supply, fill #0

## 2020-11-01 MED ORDER — POLYETHYLENE GLYCOL 3350 17 GM/SCOOP PO POWD
34.0000 g | Freq: Every day | ORAL | 0 refills | Status: DC | PRN
  Filled 2020-11-01: qty 510, 15d supply, fill #0

## 2020-11-01 MED ORDER — UMECLIDINIUM BROMIDE 62.5 MCG/INH IN AEPB
1.0000 | INHALATION_SPRAY | Freq: Every day | RESPIRATORY_TRACT | 1 refills | Status: DC
  Filled 2020-11-01: qty 30, 30d supply, fill #0

## 2020-11-01 MED ORDER — IRON POLYSACCH CMPLX-B12-FA 150-0.025-1 MG PO CAPS
150.0000 mg | ORAL_CAPSULE | Freq: Every day | ORAL | 1 refills | Status: DC
  Filled 2020-11-01: qty 30, 30d supply, fill #0

## 2020-11-01 MED ORDER — METHOCARBAMOL 500 MG PO TABS
500.0000 mg | ORAL_TABLET | Freq: Three times a day (TID) | ORAL | 0 refills | Status: DC | PRN
  Filled 2020-11-01: qty 60, 20d supply, fill #0

## 2020-11-01 MED ORDER — SPIRIVA HANDIHALER 18 MCG IN CAPS
18.0000 ug | ORAL_CAPSULE | Freq: Every day | RESPIRATORY_TRACT | 1 refills | Status: DC
  Filled 2020-11-01: qty 30, 30d supply, fill #0

## 2020-11-01 MED ORDER — BOOST PLUS PO LIQD
237.0000 mL | Freq: Three times a day (TID) | ORAL | 0 refills | Status: AC
  Filled 2020-11-01: qty 4977, 7d supply, fill #0

## 2020-11-01 MED ORDER — DILTIAZEM HCL ER COATED BEADS 120 MG PO CP24
120.0000 mg | ORAL_CAPSULE | Freq: Every day | ORAL | 1 refills | Status: DC
Start: 2020-11-01 — End: 2020-12-06
  Filled 2020-11-01: qty 30, 30d supply, fill #0

## 2020-11-01 NOTE — Progress Notes (Signed)
Remains in aflutter with variable AV block, well rate controlled. Recommend cardioversion after minimum of 3-4 weeks of uninterrupted anticoagulation, when we can be reasonably certain he can subsequently continue anticoagulation for at least 1 month after cardioversion. Please refer to sign off recommendations 10/30/2020. F/U appt scheduled 11/28/2020. Please call if new questions over the weekend.

## 2020-11-01 NOTE — Progress Notes (Signed)
Patient discharged to home via wheelchair with son with all belongings and supplies.

## 2020-11-01 NOTE — Discharge Instructions (Addendum)
[facility]      [facility] [phone]  OPEN ABDOMINAL SURGERY: POST OP INSTRUCTIONS  Always review your discharge instruction sheet given to you by the facility where your surgery was performed.  IF YOU HAVE DISABILITY OR FAMILY LEAVE FORMS, YOU MUST BRING THEM TO THE OFFICE FOR PROCESSING.  PLEASE DO NOT GIVE THEM TO YOUR DOCTOR.  1. A prescription for pain medication may be given to you upon discharge.  Take your pain medication as prescribed, if needed.  If narcotic pain medicine is not needed, then you may take acetaminophen (Tylenol) or ibuprofen (Advil) as needed. 2. Take your usually prescribed medications unless otherwise directed. 3. If you need a refill on your pain medication, please contact your pharmacy. They will contact our office to request authorization.  Prescriptions will not be filled after 5pm or on week-ends. 4. You should follow a light diet the first few days after arrival home, such as soup and crackers, pudding, etc.unless your doctor has advised otherwise. A high-fiber, low fat diet can be resumed as tolerated.   Be sure to include lots of fluids daily. Most patients will experience some swelling and bruising on the chest and neck area.  Ice packs will help.  Swelling and bruising can take several days to resolve 5. Most patients will experience some swelling and bruising in the area of the incision. Ice pack will help. Swelling and bruising can take several days to resolve..  6. It is common to experience some constipation if taking pain medication after surgery.  Increasing fluid intake and taking a stool softener will usually help or prevent this problem from occurring.  A mild laxative (Milk of Magnesia or Miralax) should be taken according to package directions if there are no bowel movements after 48 hours. 7.  You may have steri-strips (small skin tapes) in place directly over the incision.  These strips should be left on the skin for 7-10 days.  If your  surgeon used skin glue on the incision, you may shower in 24 hours.  The glue will flake off over the next 2-3 weeks.  Any sutures or staples will be removed at the office during your follow-up visit. You may find that a light gauze bandage over your incision may keep your staples from being rubbed or pulled. You may shower and replace the bandage daily. 8. ACTIVITIES:  You may resume regular (light) daily activities beginning the next day--such as daily self-care, walking, climbing stairs--gradually increasing activities as tolerated.  You may have sexual intercourse when it is comfortable.  Refrain from any heavy lifting or straining until approved by your doctor. a. You may drive when you no longer are taking prescription pain medication, you can comfortably wear a seatbelt, and you can safely maneuver your car and apply brakes b. Return to Work: ___________________________________ 9. You should see your doctor in the office for a follow-up appointment approximately two weeks after your surgery.  Make sure that you call for this appointment within a day or two after you arrive home to insure a convenient appointment time. OTHER INSTRUCTIONS:  _____________________________________________________________ _____________________________________________________________  WHEN TO CALL YOUR DOCTOR: 1. Fever over 101.0 2. Inability to urinate 3. Nausea and/or vomiting 4. Extreme swelling or bruising 5. Continued bleeding from incision. 6. Increased pain, redness, or drainage from the incision. 7. Difficulty swallowing or breathing 8. Muscle cramping or spasms. 9. Numbness or tingling in hands or feet or around lips.  The clinic staff is available to   answer your questions during regular business hours.  Please don't hesitate to call and ask to speak to one of the nurses if you have concerns.  For further questions, please visit [email or web address]  WOUND CARE: - dressing to be changed  twice daily - supplies: sterile saline, kerlix/guaze, scissors, ABD pads, tape  - remove dressing and all packing carefully, moistening with sterile saline as needed to avoid packing/internal dressing sticking to the wound. - clean edges of skin around the wound with water/gauze, making sure there is no tape debris or leakage left on skin that could cause skin irritation or breakdown. - dampen clean kerlix/gauze with sterile saline and pack wound from wound base to skin level, making sure to take note of any possible areas of wound tracking, tunneling and packing appropriately. Wound can be packed loosely. Trim kerlix/gauze to size if a whole roll/piece is not required. - cover wound with a dry ABD pad and secure with tape.  - write the date/time on the dry dressing/tape to better track when the last dressing change occurred. - apply any skin protectant/powder recommended by clinician to protect skin/skin folds. - change dressing as needed if leakage occurs, wound gets contaminated, or patient requests to shower. - patient may shower daily with wound open and following the shower the wound should be dried and a clean dressing placed.    Colostomy Home Guide, Adult  Colostomy surgery is done to create an opening in the front of the abdomen for stool (feces) to leave the body through an ostomy (stoma). Part of the large intestine is attached to the stoma. A bag, also called a pouch, is fitted over the stoma. Stool and gas will collect in the bag. After surgery, you will need to empty and change your colostomy bag as needed. You will also need to care for your stoma. How to care for the stoma Your stoma should look pink, red, and moist, like the inside of your cheek. Soon after surgery, the stoma may be swollen, but this swelling will go away within 6 weeks. To care for the stoma:  Keep the skin around the stoma clean and dry.  Use a clean, soft washcloth to gently wash the stoma and the skin  around it. Clean using a circular motion, and wipe away from the stoma opening, not toward it. ? Use warm water and only use cleansers recommended by your health care provider. ? Rinse the stoma area with plain water. ? Dry the area around the stoma well.  Use stoma powder or ointment on your skin only as told by your health care provider. Do not use any other powders, gels, wipes, or creams on the skin around the stoma.  Check the stoma area every day for signs of infection. Check for: ? New or worsening redness, swelling, or pain. ? New or increased fluid or blood. ? Pus or warmth.  Measure the stoma opening regularly and record the size. Watch for changes. (It is normal for the stoma to get smaller as swelling goes away.) Share this information with your health care provider. How to empty the colostomy bag Empty your bag at bedtime and whenever it is one-third to one-half full. Do not let the bag get more than half-full with stool or gas. The bag could leak if it gets too full. Some colostomy bags have a built-in gas release valve that releases gas often throughout the day. Follow these basic steps: 1. Wash your hands with soap and water. 2.  Sit far back on the toilet seat. 3. Put several pieces of toilet paper into the toilet water. This will prevent splashing as you empty stool into the toilet. 4. Remove the clip or the hook-and-loop fastener from the tail end of the bag. 5. Unroll the tail, then empty the stool into the toilet. 6. Clean the tail with toilet paper or a moist towelette. 7. Reroll the tail, and close it with the clip or the hook-and-loop fastener. 8. Wash your hands again.   How to change the colostomy bag Change your bag every 3-4 days or as often as told by your health care provider. Also change the bag if it is leaking or separating from the skin, or if your skin around the stoma looks or feels irritated. Irritated skin may be a sign that the bag is leaking. Always  have colostomy supplies with you, and follow these basic steps: 1. Wash your hands with soap and water. Have paper towels or tissues nearby to clean any discharge. 2. Remove the old bag and skin barrier. Use your fingers or a warm cloth to gently push the skin away from the barrier. 3. Clean the stoma area with water or with mild soap and water, as directed. Use water to rinse away any soap. 4. Dry the skin. You may use the cool setting on a hair dryer to do this. 5. Use a tracing pattern (template) to cut the skin barrier to the size needed. 6. If you are using a two-piece bag, attach the bag and the skin barrier to each other. Add the barrier ring, if you use one. 7. If directed, apply stoma powder or skin barrier gel to the skin. 8. Warm the skin barrier with your hands, or blow with a hair dryer for 5-10 seconds. 9. Remove the paper from the adhesive strip of the skin barrier. 10. Press the adhesive strip onto the skin around the stoma. 11. Gently rub the skin barrier onto the skin. This creates heat that helps the barrier to stick. 12. Apply stoma tape to the edges of the skin barrier, if desired. 28. Wash your hands again. General recommendations  Avoid wearing tight clothes or having anything press directly on your stoma or bag. Change your clothing whenever it is soiled or damp.  You may shower or bathe with the bag on or off. Do not use harsh or oily soaps or lotions. Dry the skin and bag after bathing.  Store all supplies in a cool, dry place. Do not leave supplies in extreme heat because some parts can melt or not stick as well.  Whenever you leave home, take extra clothing and an extra skin barrier and bag with you.  If your bag gets wet, you can dry it with a hair dryer on the cool setting.  To prevent odor, you may put drops of ostomy deodorizer in the bag.  If recommended by your health care provider, put ostomy lubricant inside the bag. This helps stool to slide out of the  bag more easily and completely. Contact a health care provider if:  You have new or worsening redness, swelling, or pain around your stoma.  You have new or increased fluid or blood coming from your stoma.  Your stoma feels warm to the touch.  You have pus coming from your stoma.  Your stoma extends in or out farther than normal.  You need to change your bag every day.  You have a fever. Get help right away  if:  Your stool is bloody.  You have nausea or you vomit.  You have trouble breathing. Summary  Measure your stoma opening regularly and record the size. Watch for changes.  Empty your bag at bedtime and whenever it is one-third to one-half full. Do not let the bag get more than half-full with stool or gas.  Change your bag every 3-4 days or as often as told by your health care provider.  Whenever you leave home, take extra clothing and an extra skin barrier and bag with you. This information is not intended to replace advice given to you by your health care provider. Make sure you discuss any questions you have with your health care provider. Document Revised: 01/18/2020 Document Reviewed: 01/18/2020 Elsevier Patient Education  [place].  Additional discharge instructions  Please get your medications reviewed and adjusted by your Primary MD.  Please request your Primary MD to go over all Hospital Tests and Procedure/Radiological results at the follow up, please get all Hospital records sent to your Prim MD by signing hospital release before you go home.  If you had Pneumonia of Lung problems at the Hospital: Please get a 2 view Chest X ray done in 6-8 weeks after hospital discharge or sooner if instructed by your Primary MD.  If you have Congestive Heart Failure: Please call your Cardiologist or Primary MD anytime you have any of the following symptoms:  1) 3 pound weight gain in 24 hours or 5 pounds in 1 week  2) shortness of breath, with or without a  dry hacking cough  3) swelling in the hands, feet or stomach  4) if you have to sleep on extra pillows at night in order to breathe  Follow cardiac low salt diet and 1.5 lit/day fluid restriction.  If you have diabetes Accuchecks 4 times/day, Once in AM empty stomach and then before each meal. Log in all results and show them to your primary doctor at your next visit. If any glucose reading is under 80 or above 300 call your primary MD immediately.  If you have Seizure/Convulsions/Epilepsy: Please do not drive, operate heavy machinery, participate in activities at heights or participate in high speed sports until you have seen by Primary MD or a Neurologist and advised to do so again.  If you had Gastrointestinal Bleeding: Please ask your Primary MD to check a complete blood count within one week of discharge or at your next visit. Your endoscopic/colonoscopic biopsies that are pending at the time of discharge, will also need to followed by your Primary MD.  Get Medicines reviewed and adjusted. Please take all your medications with you for your next visit with your Primary MD  Please request your Primary MD to go over all hospital tests and procedure/radiological results at the follow up, please ask your Primary MD to get all Hospital records sent to his/her office.  If you experience worsening of your admission symptoms, develop shortness of breath, life threatening emergency, suicidal or homicidal thoughts you must seek medical attention immediately by calling 911 or calling your MD immediately  if symptoms less severe.  You must read complete instructions/literature along with all the possible adverse reactions/side effects for all the Medicines you take and that have been prescribed to you. Take any new Medicines after you have completely understood and accpet all the possible adverse reactions/side effects.   Do not drive or operate heavy machinery when taking Pain medications.   Do  not take more than  prescribed Pain, Sleep and Anxiety Medications  Special Instructions: If you have smoked or chewed Tobacco  in the last 2 yrs please stop smoking, stop any regular Alcohol  and or any Recreational drug use.  Wear Seat belts while driving.  Please note You were cared for by a hospitalist during your hospital stay. If you have any questions about your discharge medications or the care you received while you were in the hospital after you are discharged, you can call the unit and asked to speak with the hospitalist on call if the hospitalist that took care of you is not available. Once you are discharged, your primary care physician will handle any further medical issues. Please note that NO REFILLS for any discharge medications will be authorized once you are discharged, as it is imperative that you return to your primary care physician (or establish a relationship with a primary care physician if you do not have one) for your aftercare needs so that they can reassess your need for medications and monitor your lab values.  You can reach the hospitalist office at phone [phone] or fax [phone]   If you do not have a primary care physician, you can call [phone] for a physician referral.     Information on my medicine - ELIQUIS (apixaban)  Why was Eliquis prescribed for you? Eliquis was prescribed for you to reduce the risk of a blood clot forming that can cause a stroke if you have a medical condition called atrial fibrillation (a type of irregular heartbeat).  What do You need to know about Eliquis ? Take your Eliquis TWICE DAILY - one tablet in the morning and one tablet in the evening with or without food. If you have difficulty swallowing the tablet whole please discuss with your pharmacist how to take the medication safely.  Take Eliquis exactly as prescribed by your doctor and DO NOT stop taking Eliquis without talking to the doctor who prescribed the  medication.  Stopping may increase your risk of developing a stroke.  Refill your prescription before you run out.  After discharge, you should have regular check-up appointments with your healthcare provider that is prescribing your Eliquis.  In the future your dose may need to be changed if your kidney function or weight changes by a significant amount or as you get older.  What do you do if you miss a dose? If you miss a dose, take it as soon as you remember on the same day and resume taking twice daily.  Do not take more than one dose of ELIQUIS at the same time to make up a missed dose.  Important Safety Information A possible side effect of Eliquis is bleeding. You should call your healthcare provider right away if you experience any of the following: ? Bleeding from an injury or your nose that does not stop. ? Unusual colored urine (red or dark brown) or unusual colored stools (red or black). ? Unusual bruising for unknown reasons. ? A serious fall or if you hit your head (even if there is no bleeding).  Some medicines may interact with Eliquis and might increase your risk of bleeding or clotting while on Eliquis. To help avoid this, consult your healthcare provider or pharmacist prior to using any new prescription or non-prescription medications, including herbals, vitamins, non-steroidal anti-inflammatory drugs (NSAIDs) and supplements.  This website has more information on Eliquis (apixaban): http://[email or web address].com/eliquis/home

## 2020-11-01 NOTE — Progress Notes (Signed)
Progress Note  8 Days Post-Op  Subjective: Patient reports he is doing "so so". Denies pain. He reports he had some nausea overnight but it is better now. He denies emesis. He is still having bowel function. He wants to get home today.   Objective: Vital signs in last 24 hours: Temp:  [97.8 F (36.6 C)-98 F (36.7 C)] 97.8 F (36.6 C) (06/03 0524) Pulse Rate:  [83-100] 83 (06/03 0938) Resp:  [19] 19 (06/03 0524) BP: (99-110)/(59-75) 110/59 (06/03 0938) SpO2:  [91 %-96 %] 96 % (06/03 0524) Last BM Date: 10/31/20 (thru colostomy)  Intake/Output from previous day: 06/02 0701 - 06/03 0700 In: 56 [P.O.:580] Out: 1390 [Urine:875; Drains:90; Stool:425] Intake/Output this shift: No intake/output data recorded.  PE: General: pleasant, WD,chronically ill appearingmale who is laying in bed in NAD Heart: regular, rate, and rhythm.  Lungs: CTAB, no wheezes, rhonchi, or rales noted. Respiratory effort nonlabored Abd: soft,appropriately ttp,mildly distended, +BS,midline wound appears clean, soft brown stool in ostomy appliance and stoma appears viable    Lab Results:  Recent Labs    10/31/20 0057 11/01/20 0054  WBC 4.8 4.5  HGB 8.2* 8.2*  HCT 26.7* 26.9*  PLT 95* 94*   BMET Recent Labs    10/31/20 0057 11/01/20 0054  NA 138 139  K 4.3 3.9  CL 103 106  CO2 28 28  GLUCOSE 106* 109*  BUN 27* 26*  CREATININE 1.03 1.06  CALCIUM 8.6* 8.6*   PT/INR No results for input(s): LABPROT, INR in the last 72 hours. CMP     Component Value Date/Time   NA 139 11/01/2020 0054   NA 139 01/29/2020 1548   K 3.9 11/01/2020 0054   CL 106 11/01/2020 0054   CO2 28 11/01/2020 0054   GLUCOSE 109 (H) 11/01/2020 0054   BUN 26 (H) 11/01/2020 0054   BUN 34 (H) 01/29/2020 1548   CREATININE 1.06 11/01/2020 0054   CALCIUM 8.6 (L) 11/01/2020 0054   PROT 6.0 (L) 10/23/2020 0048   PROT 7.3 01/29/2020 1548   ALBUMIN 2.3 (L) 10/23/2020 0048   ALBUMIN 3.9 01/29/2020 1548   AST 15  10/23/2020 0048   ALT 19 10/23/2020 0048   ALKPHOS 63 10/23/2020 0048   BILITOT 1.2 10/23/2020 0048   BILITOT 0.4 01/29/2020 1548   GFRNONAA >60 11/01/2020 0054   GFRAA 79 01/29/2020 1548   Lipase  No results found for: LIPASE     Studies/Results: No results found.  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 I sent pain meds to pharmacy and follow up and instructions are in AVS - anticoagulation restarted  AKI -resolved  FEN -soft diet, IVFKVO VTE -SCDs, eliquis ID -cefotetan pre-op   A fib/flutter - per  cards H/O MVR - echo is stable  COPD/tobacco abuse - smokes pipe, per primary Basal cell carcinoma x2 of LUE - currently undergoing radiation therapy DM - per primary H/O HTN ABL anemia - secondary to sigmoid colon mass, hgbstable Thrombocytopenia - unclear etiology. [id number] today, improving slowly  SevereProteinCalorieMalnutrition- prealbumin 6.55/31, continue nutritional supplements  LOS: 15 days    Norm Parcel, [facility] 11/01/2020, 9:59 AM Please see Amion for pager number during day hours 7:00am-4:30pm

## 2020-11-01 NOTE — Progress Notes (Signed)
Physical Therapy Treatment Patient Details Name: [name] MRN: [id number] DOB: November 28, 1945 Today's Date: 11/01/2020    History of Present Illness 75 y.o. male transferred from echo lab to ER on 5/19 when noted to have HR in 140s, with rhythm of atrial flutter and asymptomatic. Chest Abdomen Pelvis CT shows large colonic mass in the sigmoid colon with pelvic lymphadenopathy and small pulmonary nodules indicative of metastatic disease, potential pneumonia of L lower lobe, and mild paraseptal emphysema. Pt is now s/p sigmoid colectomy with end colostomy and cystoscopy with bil ureteral stents on 10/25/20. 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    PT Comments    Pt received in supine, agreeable to limited therapy session in room which pt performed with good tolerance. Pt performed stair trial and household distance gait task with min guard at most and did not need assistive device for gait task although mildly unsteady and would benefit from trial with cane (pt indicated he is not amenable to AD). Pt given HEP handout for supine/standing exercises and encouraged him to perform multiple times a day for strengthening, and to only perform standing exercises if physical assist available for safety. Pt continues to benefit from PT services to progress toward functional mobility goals.   Follow Up Recommendations  No PT follow up;Supervision for mobility/OOB     Equipment Recommendations  None recommended by PT (pt declined rollator)    Recommendations for Other Services       Precautions / Restrictions Precautions Precautions: Fall Precaution Comments: ostomy Restrictions Weight Bearing Restrictions: No    Mobility  Bed Mobility Overal bed mobility: Modified Independent             General bed mobility comments: HOB elevated    Transfers Overall  transfer level: Needs assistance Equipment used: None Transfers: Sit to/from Stand Sit to Stand: Supervision         General transfer comment: Performed sit<>stand from EOB and BSC heights no physical assist needed  Ambulation/Gait Ambulation/Gait assistance: Supervision Gait Distance (Feet): 30 Feet Assistive device: None Gait Pattern/deviations: Step-through pattern   Gait velocity interpretation: <1.31 ft/sec, indicative of household ambulator General Gait Details: pt deferred hallway ambulation but agreeable to mobility in room/stair training.   Stairs Stairs: Yes Stairs assistance: Min guard Stair Management: With walker;Forwards;Step to pattern Number of Stairs: 5 General stair comments: performed 4" step in room safely with RW to simulate handrails   Wheelchair Mobility    Modified Rankin (Stroke Patients Only)       Balance Overall balance assessment: Mild deficits observed, not formally tested Sitting-balance support: No upper extremity supported Sitting balance-Leahy Scale: Good     Standing balance support: No upper extremity supported;During functional activity Standing balance-Leahy Scale: Good Standing balance comment: Pt without overt LOB for mobility tasks but did sit to brush teeth to conserve energy.                            Cognition Arousal/Alertness: Awake/alert Behavior During Therapy: WFL for tasks assessed/performed Overall Cognitive Status: History of cognitive impairments - at baseline                                 General Comments: Follows commands but does have decreased safety awareness and memory. Pt able to indicate to  PTA that he needs ostomy bag emptied prior to PT session.      Exercises      General Comments General comments (skin integrity, edema, etc.): VSS on RA      Pertinent Vitals/Pain Pain Assessment: No/denies pain    Home Living                      Prior Function             PT Goals (current goals can now be found in the care plan section) Acute Rehab PT Goals Patient Stated Goal: return home PT Goal Formulation: With patient/family Time For Goal Achievement: 11/08/20 Potential to Achieve Goals: Good Progress towards PT goals: Progressing toward goals    Frequency    Min 3X/week      PT Plan Current plan remains appropriate    AM-PAC PT "6 Clicks" Mobility   Outcome Measure  Help needed turning from your back to your side while in a flat bed without using bedrails?: None Help needed moving from lying on your back to sitting on the side of a flat bed without using bedrails?: None Help needed moving to and from a bed to a chair (including a wheelchair)?: A Little Help needed standing up from a chair using your arms (e.g., wheelchair or bedside chair)?: A Little Help needed to walk in hospital room?: A Little Help needed climbing 3-5 steps with a railing? : A Little 6 Click Score: 20    End of Session   Activity Tolerance: Patient tolerated treatment well Patient left: in bed;with call bell/phone within reach (pt able to demo use of call bell, bed alarm not on when PTA arrived to room) Nurse Communication: Mobility status;Other (comment) (pt ostomy bag will need to be emptied again prior to DC (not full yet)) PT Visit Diagnosis: Other abnormalities of gait and mobility (R26.89)     Time: 1017-5102 PT Time Calculation (min) (ACUTE ONLY): 14 min  Charges:  $Gait Training: 8-22 mins                     Krystale Rinkenberger P., PTA Acute Rehabilitation Services Pager: [phone] Office: [place] 11/01/2020, 6:45 PM

## 2020-11-01 NOTE — Care Management Important Message (Signed)
Important Message  Patient Details  Name: [name] MRN: [id number] Date of Birth: September 08, 1945   Medicare Important Message Given:  Yes     [name] 11/01/2020, 4:03 PM

## 2020-11-01 NOTE — Progress Notes (Signed)
Discharge instructions given to patient. Patient verbalized understanding of teaching. Showed patient how to empty colostomy bag. Emptied 129mL with patient assisting. Patient also given supplies for abdominal dressing changes as well as take home medications from pharmacy.

## 2020-11-01 NOTE — Discharge Summary (Addendum)
Physician Discharge Summary  [name]:[id number] DOB: May 02, 1946  PCP: Pcp, No  Admitted from: Home Discharged to: Home  Admit date: 10/17/2020 Discharge date: 11/01/2020  Recommendations for Outpatient Follow-up:    Follow-up Information    [facility] [place] Follow up.   Specialty: Home Health Services Contact information: [place] [address] [place] Follow up.   Why: December 09, 2020 at 2:30 pm.  To be seen with repeat labs (CBC & CMP). Contact information: [place] [address] [phone]       Deberah Pelton, NP Follow up.   Specialty: Cardiology Why: [facility] location - Thursday November 28, 2020 at 11:15 AM (Arrive by 11:00 AM). [name] is one of our nurse practitioners with our cardiology team. Contact information: [address] [place] [address] [phone]        Surgery, [place]. Go on 11/21/2020.   Specialty: General Surgery Why: 2:00 PM. Please arrive 30 min prior to appointment time for check in. Bring photo ID and any insurance information with you.  Contact information: [place]  [phone]                Home Health:  Home Health Orders (From admission, onward)    Start     Ordered   10/29/20 [place]  At discharge       Comments: New ostomy  Wet to dry dressing BID  Question:  To provide the following care/treatments  Answer:  RN   10/29/20 1246           Equipment/Devices: None    Discharge Condition: Improved and stable.   Code Status: Full Code Diet recommendation:  Discharge Diet Orders (From admission, onward)    Start     Ordered   11/01/20 0000  Diet - low sodium heart healthy        11/01/20 1120           Discharge Diagnoses:  Active Problems:   Hx of bacterial endocarditis   H/O mitral valve  replacement   Basal cell carcinoma (BCC) of skin of left wrist   Atrial flutter (HCC)   Microcytic anemia   COPD (chronic obstructive pulmonary disease) (HCC)   Atrial fibrillation with RVR (HCC)   Occult blood in stools   Iron deficiency anemia due to chronic blood loss   Protein-calorie malnutrition, severe   Brief Summary: 75 years old male with PMH significant for 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  He was diagnosed with atrial flutter with RVR.Patient was seen by Cardiology and started on digoxin and Cardizem p.o.  Apparently Cardizem IV drip dropped his blood pressure drastically. Plan was to do TEE and DC cardioversion however over the weekend patient had guaiac positive stools and GI was consulted and patient had EGD and colonoscopy done on 10/21/2020 which showed a possible partially obstructing mass in the sigmoid colon.  S/p sigmoid colectomy with colostomy on 2/26 by general surgery.  Pathology confirms adenocarcinoma.  Oncology consulted and will follow up as outpatient post discharge.  Assessment & Plan:  Partially obstructing adenocarcinoma of sigmoid colon, S/p sigmoid  colectomy with end colostomy on 5/26 General surgery following postop.  Diet advanced and tolerating same.  Colostomy functioning well.  WOC following for colostomy teaching.  Surgery removed drain yesterday.  Pathology confirms adenocarcinoma.  Oncology follow-up 6/1 appreciated, indicates stage II colon cancer and they have arranged outpatient follow-up on 6/17 at 10:05 AM.  General surgery has seen today.  I discussed with them personally.  They have made a referral to the outpatient ostomy clinic.  They have cleared him for discharge and have arranged outpatient follow-up.  As per nursing,  family has been educated along with patient regarding management of colostomy.  May need outpatient follow-up with urology regarding management of bilateral temporary urinary stents.  Will defer to oncology regarding arranging follow-up when felt appropriate.  As part of the surgery, surgery had requested urology to place stents in each ureter to help facilitate the dissection.  AKI, not POA --developed after surgery, likely ATN -- Resolved.  Atypical Aflutter with RVR -Heart rate difficult to control in the setting of soft blood pressure  -Earlier, due to lower GI bleed is not a candidate for cardioversion.  However now that he has had sigmoid colectomy, as per cardiology recommendations, start Eliquis when okayed by surgery. -TSH wnl --Rate controlled on metoprolol 50 Mg twice daily and Cardizem CD 120 Mg daily. -Cardiology signed off 6/1 and have scheduled follow-up. -Eliquis started on 6/2.  Risks and benefits of anticoagulation, options for anticoagulation were discussed in detail with patient at bedside and family via phone.  They were agreeable to Eliquis. -Per cardiology note, remains in atrial flutter with variable AV block, rate well controlled.  Recommend cardioversion after minimum of 3 to 4 weeks of uninterrupted anticoagulation and when we can be reasonably certain that he can subsequently continue anticoagulation for at least 1 month after cardioversion.  History of bioprosthetic mitral valve replacement and infective endocarditis  -As per cardiology, normal bioprosthesis function and normal LVEF on echo.  Chronic blood loss anemia Severe iron deficiency anemia/thrombocytopenia -Anemia was secondary to partially obstructing sigmoid adenocarcinoma .  Etiology of thrombocytopenia unclear. -Received IV iron .  Anemia panel consistent with severe iron deficiency anemia -s/p 1u pRBC  - GI evaluation and management as noted above - EGD was essentially normal. -Hemoglobin up to  8.2 >8.2 >8.2.    Platelet count stable in the 90s over the last 3 days.  COPD -- Unfortunately patient's insurance did not cover Incruse Ellipta or Xopenex inhaler and had to change to Spiriva and as needed Ventolin inhaler.  No clinical bronchospasm today.  Current smoker -cessation advised.  He is not ready to give up smoking yet. --cont nicotine patch  Basal cell carcinoma  --left hand covered with dressing.  As per oncology, appears to have advanced basal cell carcinoma of the left wrist.  Acute on chronic thrombocytopenia -with iron deficiency and guaiac positive stools with nonobstructing sigmoid mass with pending biopsy.  Patient has chronic thrombocytopenia which seem to have worsened. --HIT panel neg -Platelet counts stable in the 90s.  Outpatient follow-up with hematology.  Hyperkalemia, resolved --s/p lokelma 5 mg BID  Severe malnutrition in context of chronic illness/Body mass index is 20.54 kg/m. --supplements per dietician   ?  DM diagnosis: A1c 5.7 on 5/25.    Consultants:   Cardiology   GI  General surgery  Medical oncology  Urology  Procedures:  EGD/colonoscopy 5/23 Sigmoid colectomy with end colostomy on 5/26 Cystoscopy, retrograde pyelogram with interpretation, bilateral temporary ureteral stent  placement and urethral balloon dilatation by urology on 5/26. Foley catheter discontinued 5/30.  Pathology:  From 10/21/2020: FINAL MICROSCOPIC DIAGNOSIS:   A. COLON, SIGMOID MASS, BIOPSY:  - Adenocarcinoma.  - See comment.   From 10/24/2020 FINAL MICROSCOPIC DIAGNOSIS:   A. COLON, SIGMOID, COLECTOMY:  - Invasive colorectal adenocarcinoma.  - Carcinoma extends into pericolonic connective tissue.  - Fourteen lymph nodes negative for metastatic carcinoma.  - See oncology table and comment.   Discharge Instructions  Discharge Instructions    (HEART FAILURE PATIENTS) Call MD:  Anytime you have any of the following symptoms: 1) 3  pound weight gain in 24 hours or 5 pounds in 1 week 2) shortness of breath, with or without a dry hacking cough 3) swelling in the hands, feet or stomach 4) if you have to sleep on extra pillows at night in order to breathe.   Complete by: As directed    Amb Referral to Ostomy Clinic   Complete by: As directed    Reason for referral modifiers: Pre and post-operative counseling for ostomy management   Call MD for:  difficulty breathing, headache or visual disturbances   Complete by: As directed    Call MD for:  extreme fatigue   Complete by: As directed    Call MD for:  persistant dizziness or light-headedness   Complete by: As directed    Call MD for:  persistant nausea and vomiting   Complete by: As directed    Call MD for:  redness, tenderness, or signs of infection (pain, swelling, redness, odor or green/yellow discharge around incision site)   Complete by: As directed    Call MD for:  severe uncontrolled pain   Complete by: As directed    Call MD for:  temperature >100.4   Complete by: As directed    Diet - low sodium heart healthy   Complete by: As directed    Discharge wound care:   Complete by: As directed    Kindly follow postoperative abdominal wound dressing changes as outlined separately in the discharge instructions by the surgeons.   Increase activity slowly   Complete by: As directed        Medication List    STOP taking these medications   aspirin EC 81 MG tablet   furosemide 40 MG tablet Commonly known as: LASIX     TAKE these medications   acetaminophen 325 MG tablet Commonly known as: TYLENOL Take 2 tablets (650 mg total) by mouth every 6 (six) hours as needed for mild pain or fever.   albuterol 108 (90 Base) MCG/ACT inhaler Commonly known as: VENTOLIN HFA Inhale 1-2 puffs into the lungs every 6 (six) hours as needed for wheezing or shortness of breath.   apixaban 5 MG Tabs tablet Commonly known as: ELIQUIS Take 1 tablet (5 mg total) by mouth 2 (two)  times daily.   diltiazem 120 MG 24 hr capsule Commonly known as: CARDIZEM CD Take 1 capsule (120 mg total) by mouth at bedtime.   Iron Polysacch Cmplx-B12-FA 150-0.025-1 MG Caps Take 1 capsule by mouth daily. Start taking on: November 02, 2020   lactose free nutrition Liqd Take 237 mLs by mouth 3 (three) times daily with meals for 7 days.   methocarbamol 500 MG tablet Commonly known as: ROBAXIN Take 1 tablet (500 mg total) by mouth every 8 (eight) hours as needed for muscle spasms.   metoprolol tartrate 50 MG tablet Commonly known as: LOPRESSOR Take 1 tablet (50 mg total)  by mouth 2 (two) times daily.   Multi-Vitamin Daily Tabs Take 1 tablet by mouth daily.   nicotine 14 mg/24hr patch Commonly known as: NICODERM CQ - dosed in mg/24 hours Place 1 patch (14 mg total) onto the skin daily. Start taking on: November 02, 2020   ondansetron 4 MG tablet Commonly known as: Zofran Take 1 tablet (4 mg total) by mouth daily as needed for nausea or vomiting.   oxyCODONE 5 MG immediate release tablet Commonly known as: Oxy IR/ROXICODONE Take 0.5-1 tablets (2.5-5 mg total) by mouth every 6 (six) hours as needed for moderate pain or severe pain.   polyethylene glycol powder 17 GM/SCOOP powder Commonly known as: GLYCOLAX/MIRALAX Dissolve 2 capfuls (34 g total) by mouth daily as needed for mild constipation.   Spiriva HandiHaler 18 MCG inhalation capsule Generic drug: tiotropium Place 1 capsule (18 mcg total) into inhaler and inhale daily.      Allergies  Allergen Reactions  . Orange Juice [Orange Oil] Hives      Procedures/Studies: DG Chest 2 View  Result Date: 10/17/2020 CLINICAL DATA:  Chest pain, shortness of breath, extreme fatigue EXAM: CHEST - 2 VIEW COMPARISON:  None. FINDINGS: Cardiomediastinal silhouette and pulmonary vasculature are within normal limits. Median sternotomy changes are seen. The third wire is fractured. Prosthetic valve is seen. Lungs are hyperexpanded, but  otherwise clear clear. IMPRESSION: No acute cardiopulmonary process. Electronically Signed   By: Miachel Roux M.D.   On: 10/17/2020 15:46   DG Retrograde Pyelogram  Result Date: 10/24/2020 CLINICAL DATA:  75 year old male with colorectal cancer . EXAM: RETROGRADE PYELOGRAM COMPARISON:  CT chest, abdomen and pelvis 10/22/2020 FINDINGS: A total of 3 intraoperative saved images obtained during bilateral double-J ureteral stent placement are submitted for review. The images demonstrate a catheter in the bladder with contrast opacification of the bladder. Partial bilateral nephroureterograms. A ureteral stent is partially visualized on the left. No focal abnormality of the visualized portions of either ureter. IMPRESSION: Ureteropyelogram with placement of bilateral ureteral stents. Electronically Signed   By: Jacqulynn Cadet M.D.   On: 10/24/2020 11:14   CT CHEST ABDOMEN PELVIS W CONTRAST  Result Date: 10/22/2020 CLINICAL DATA:  75 year old male with history of colorectal cancer. Staging examination. EXAM: CT CHEST, ABDOMEN, AND PELVIS WITH CONTRAST TECHNIQUE: Multidetector CT imaging of the chest, abdomen and pelvis was performed following the standard protocol during bolus administration of intravenous contrast. CONTRAST:  126mL OMNIPAQUE IOHEXOL 300 MG/ML  SOLN COMPARISON:  No priors. FINDINGS: CT CHEST FINDINGS Cardiovascular: Heart size is normal. There is no significant pericardial fluid, thickening or pericardial calcification. Aortic atherosclerosis. No definite coronary artery calcifications. Status post median sternotomy for mitral valve replacement. Mediastinum/Nodes: No pathologically enlarged mediastinal or hilar lymph nodes. Esophagus is unremarkable in appearance. No axillary lymphadenopathy. Lungs/Pleura: Rounded area of airspace consolidation in the posterior aspect of the left lower lobe (axial image 125 of series 5), likely a focus of infection. Small pulmonary nodules are noted measuring 6  mm in the inferior aspect of the lingula (axial image 101 of series 5), 5 mm in the periphery of the left lower lobe (axial image 72 of series 5), and 5 mm in the posterior aspect of the right lower lobe (axial image 94 of series 5). Areas of linear scarring or atelectasis are noted in the upper lobes of the lungs bilaterally. Mild paraseptal emphysema. Musculoskeletal: Median sternotomy wires. There are no aggressive appearing lytic or blastic lesions noted in the visualized portions of the skeleton.  CT ABDOMEN PELVIS FINDINGS Hepatobiliary: Multiple subcentimeter low-attenuation lesions scattered throughout the hepatic parenchyma, too small to characterize, but statistically likely to represent cysts. The larger of the low-attenuation hepatic lesions are compatible with simple cysts, measuring up to 11 mm in the periphery of segment 4A. No other suspicious hepatic lesions. No intra or extrahepatic biliary ductal dilatation. Gallbladder is normal in appearance. Pancreas: No pancreatic mass. No pancreatic ductal dilatation. No pancreatic or peripancreatic fluid collections or inflammatory changes. Spleen: Unremarkable. Adrenals/Urinary Tract: 1.5 cm low-attenuation lesion in the posterior aspect of the interpolar region of the left kidney, compatible with a simple cyst. Right kidney and bilateral adrenal glands are normal in appearance. No hydroureteronephrosis. Urinary bladder is normal in appearance. Stomach/Bowel: The appearance of the stomach is normal. There is no pathologic dilatation of small bowel or colon. Mass-like area of mural thickening in the sigmoid colon estimated to measure approximately 10.4 x 6.3 x 6.3 cm (axial image 108 of series 3 and coronal image 55 of series 6). Focal direct extension of malignant appearing soft tissue posteriorly from the lesion (axial image 106 of series 3) into the sigmoid mesocolon. The appendix is not confidently identified and may be surgically absent. Regardless,  there are no inflammatory changes noted adjacent to the cecum to suggest the presence of an acute appendicitis at this time. Vascular/Lymphatic: Aortic atherosclerosis, without evidence of aneurysm or dissection in the abdominal or pelvic vasculature. Enlarged right external iliac lymph node measuring 1.5 cm in short axis (axial image 106 of series 3). Several other prominent but nonenlarged lymph nodes are noted in the pelvis, largest of which measures up to 6 mm in the low left anterior pelvis (axial image 113 of series 3). Enlarged right common iliac lymph node (axial image 97 of series 3) measuring 1.6 cm in short axis. Reproductive: Prostate gland and seminal vesicles are unremarkable in appearance. Other: No significant volume of ascites.  No pneumoperitoneum. Musculoskeletal: There are no aggressive appearing lytic or blastic lesions noted in the visualized portions of the skeleton. IMPRESSION: 1. Large colonic mass estimated to measure approximately 10.4 x 6.3 x 6.3 cm in the sigmoid colon with pelvic lymphadenopathy indicative of metastatic disease, as above. Small pulmonary nodules are also noted, which could conceivably represent metastatic lesions as well. Attention on follow-up imaging is recommended to assess for the stability of these nodules. 2. Airspace consolidation in the left lower lobe concerning for potential pneumonia. 3. Aortic atherosclerosis. 4. Mild paraseptal emphysema. 5. Additional incidental findings, as above. Electronically Signed   By: Vinnie Langton M.D.   On: 10/22/2020 18:54   DG CHEST PORT 1 VIEW  Result Date: 10/24/2020 CLINICAL DATA:  Postop central line placement EXAM: PORTABLE CHEST 1 VIEW COMPARISON:  10/17/2020 FINDINGS: Right jugular catheter is been placed with the tip in the mid SVC. No pneumothorax Median sternotomy. Negative for heart failure. Underlying chronic lung disease. No acute infiltrate or effusion. IMPRESSION: Central line placement into the SVC.  No  pneumothorax. Electronically Signed   By: Franchot Gallo M.D.   On: 10/24/2020 13:45   DG Abd Portable 1V  Result Date: 10/29/2020 CLINICAL DATA:  Abdominal pain and distension following abdominal surgery EXAM: PORTABLE ABDOMEN - 1 VIEW COMPARISON:  Portable exam 1030 hours compared to CT abdomen and pelvis 10/22/2020 FINDINGS: Ostomy RIGHT lower quadrant. Surgical drain in pelvis. Numerous loops of air-filled mildly distended small bowel. Increased stool in RIGHT colon. Findings most likely represent postoperative ileus. No acute osseous findings or urinary  tract calcification. IMPRESSION: Probable postoperative ileus. Electronically Signed   By: Lavonia Dana M.D.   On: 10/29/2020 10:43   ECHOCARDIOGRAM COMPLETE  Result Date: 10/19/2020    ECHOCARDIOGRAM REPORT   Patient Name:   [name] Date of Exam: 10/19/2020 Medical Rec #:  [id number]        Height:       72.0 in Accession #:    [id number]       Weight:       179.0 lb Date of Birth:  03-Dec-1945        BSA:          2.032 m Patient Age:    56 years         BP:           93/61 mmHg Patient Gender: M                HR:           133 bpm. Exam Location:  Inpatient Procedure: 2D Echo, 3D Echo, Cardiac Doppler and Color Doppler Indications:    Abnormal EKG  History:        Patient has prior history of Echocardiogram examinations, most                 recent 07/24/2016. COPD, Signs/Symptoms:Shortness of Breath; Risk                 Factors:Current Smoker and Hypertension. History of                 endocarditis.                  Mitral Valve: 31 mm Medtronic Mosaic bioprosthetic valve valve                 is present in the mitral position. Procedure Date: 07/14/2016.  Sonographer:    Darlina Sicilian RDCS Referring Phys: [phone] BYRON Crofton  Sonographer Comments: Global longitudinal strain was attempted. IMPRESSIONS  1. Left ventricular ejection fraction, by estimation, is 65 to 70%. The left ventricle has normal function. The left ventricle has no  regional wall motion abnormalities. Left ventricular diastolic function could not be evaluated.  2. Right ventricular systolic function is normal. The right ventricular size is mildly enlarged. There is normal pulmonary artery systolic pressure. The estimated right ventricular systolic pressure is 49.2 mmHg.  3. 31 mm mosaic prosthetic valve with MG 7 mmHG @ 130 bpm. EOA 2.32 cm2. DI 1.6. Normal prosthesis with higher gradients due to tachycardia. The mitral valve has been repaired/replaced. No evidence of mitral valve regurgitation. There is a 31 mm Medtronic Mosaic bioprosthetic valve present in the mitral position. Procedure Date: 07/14/2016.  4. The aortic valve is tricuspid. Aortic valve regurgitation is not visualized. No aortic stenosis is present.  5. The inferior vena cava is normal in size with greater than 50% respiratory variability, suggesting right atrial pressure of 3 mmHg. FINDINGS  Left Ventricle: Left ventricular ejection fraction, by estimation, is 65 to 70%. The left ventricle has normal function. The left ventricle has no regional wall motion abnormalities. The left ventricular internal cavity size was normal in size. There is  no left ventricular hypertrophy. Left ventricular diastolic function could not be evaluated due to mitral valve replacement. Left ventricular diastolic function could not be evaluated. Right Ventricle: The right ventricular size is mildly enlarged. No increase in right ventricular wall thickness. Right ventricular systolic function is normal. There is normal pulmonary  artery systolic pressure. The tricuspid regurgitant velocity is 2.46  m/s, and with an assumed right atrial pressure of 3 mmHg, the estimated right ventricular systolic pressure is 56.3 mmHg. Left Atrium: Left atrial size was normal in size. Right Atrium: Right atrial size was normal in size. Pericardium: Trivial pericardial effusion is present. Mitral Valve: 31 mm mosaic prosthetic valve with MG 7 mmHG @ 130  bpm. EOA 2.32 cm2. DI 1.6. Normal prosthesis with higher gradients due to tachycardia. The mitral valve has been repaired/replaced. No evidence of mitral valve regurgitation. There is a 31 mm Medtronic Mosaic bioprosthetic valve present in the mitral position. Procedure Date: 07/14/2016. MV peak gradient, 12.2 mmHg. The mean mitral valve gradient is 7.0 mmHg. Tricuspid Valve: The tricuspid valve is grossly normal. Tricuspid valve regurgitation is mild . No evidence of tricuspid stenosis. Aortic Valve: The aortic valve is tricuspid. Aortic valve regurgitation is not visualized. No aortic stenosis is present. Pulmonic Valve: The pulmonic valve was grossly normal. Pulmonic valve regurgitation is not visualized. No evidence of pulmonic stenosis. Aorta: The aortic root and ascending aorta are structurally normal, with no evidence of dilitation. Venous: The inferior vena cava is normal in size with greater than 50% respiratory variability, suggesting right atrial pressure of 3 mmHg. IAS/Shunts: The atrial septum is grossly normal.  LEFT VENTRICLE PLAX 2D LVIDd:         3.50 cm LVIDs:         2.40 cm LV PW:         0.90 cm LV IVS:        0.90 cm LVOT diam:     2.20 cm LV SV:         49 LV SV Index:   24 LVOT Area:     3.80 cm  RIGHT VENTRICLE RV S prime:     9.03 cm/s TAPSE (M-mode): 1.3 cm LEFT ATRIUM             Index       RIGHT ATRIUM           Index LA diam:        2.80 cm 1.38 cm/m  RA Area:     16.00 cm LA Vol (A2C):   16.8 ml 8.27 ml/m  RA Volume:   44.60 ml  21.94 ml/m LA Vol (A4C):   32.1 ml 15.79 ml/m LA Biplane Vol: 25.2 ml 12.40 ml/m  AORTIC VALVE LVOT Vmax:   98.40 cm/s LVOT Vmean:  67.900 cm/s LVOT VTI:    0.128 m  AORTA Ao Root diam: 3.70 cm Ao Asc diam:  3.30 cm MITRAL VALVE             TRICUSPID VALVE MV Area VTI:  2.32 cm   TR Peak grad:   24.2 mmHg MV Peak grad: 12.2 mmHg  TR Vmax:        246.00 cm/s MV Mean grad: 7.0 mmHg MV Vmax:      1.75 m/s   SHUNTS MV Vmean:     122.0 cm/s Systemic VTI:   0.13 m MV VTI:       0.21 m     Systemic Diam: 2.20 cm Eleonore Chiquito MD Electronically signed by Eleonore Chiquito MD Signature Date/Time: 10/19/2020/1:35:36 PM    Final       Subjective: Denies complaints.  Actually denies abdominal pain.  Tolerated diet.  No vomiting.  Some self-limiting nausea last night.  Denies dyspnea or chest pain.  Eager for discharge.  States  that he uses a cane to ambulate at home.  Indicates that he has been ambulating in the room as well.  Discharge Exam:  Vitals:   10/31/20 0447 10/31/20 2117 11/01/20 0524 11/01/20 0938  BP: 105/65 106/61 99/75 (!) 110/59  Pulse: 82 100 100 83  Resp: 16 19 19    Temp: 97.6 F (36.4 C) 98 F (36.7 C) 97.8 F (36.6 C)   TempSrc: Oral Oral Oral   SpO2: 96% 91% 96%   Weight:      Height:        General exam: Elderly male, moderately built and poorly nourished lying comfortably propped up in bed without distress.  Some bitemporal wasting. Respiratory system:  Clear to auscultation.  No increased work of breathing. Cardiovascular system: S1 and S2 heard, RRR.  No JVD or murmurs.  Telemetry personally reviewed: A. fib with controlled ventricular rate in the 70s-80s. Gastrointestinal system: Abdomen is nondistended.  Midline surgical dressing clean and dry.  Left colostomy with soft brown stools.  Right-sided abdominal drain present yesterday morning has been removed.  Nontender.  Normal bowel sounds heard.  No organomegaly or masses appreciated. Central nervous system: Alert and oriented x2. No focal neurological deficits. Extremities: Symmetric 5 x 5 power. Skin: No rashes, lesions or ulcers Psychiatry: Judgement and insight appear normal. Mood & affect appropriate.     The results of significant diagnostics from this hospitalization (including imaging, microbiology, ancillary and laboratory) are listed below for reference.     Microbiology: Recent Results (from the past 240 hour(s))  Surgical pcr screen     Status: Abnormal    Collection Time: 10/24/20 12:35 AM   Specimen: Nasal Mucosa; Nasal Swab  Result Value Ref Range Status   MRSA, PCR NEGATIVE NEGATIVE Final   Staphylococcus aureus POSITIVE (A) NEGATIVE Final    Comment: (NOTE) The Xpert SA Assay (FDA approved for NASAL specimens in patients 79 years of age and older), is one component of a comprehensive surveillance program. It is not intended to diagnose infection nor to guide or monitor treatment. Performed at [facility], [place] [address], [place], [place] [address]      Labs: CBC: Recent Labs  Lab 10/28/20 0020 10/29/20 0111 10/30/20 0112 10/31/20 0057 11/01/20 0054  WBC 8.1 5.3 5.9 4.8 4.5  HGB 7.6* 7.6* 8.2* 8.2* 8.2*  HCT 25.0* 25.0* 26.0* 26.7* 26.9*  MCV 78.6* 78.1* 77.4* 78.3* 79.1*  PLT 76* 84* 90* 95* 94*    Basic Metabolic Panel: Recent Labs  Lab 10/26/20 0234 10/27/20 0133 10/28/20 0020 10/29/20 0111 10/30/20 0112 10/31/20 0057 11/01/20 0054  NA 134* 135 133* 134* 136 138 139  K 5.3* 5.5* 4.6 4.1 4.1 4.3 3.9  CL 103 105 103 101 103 103 106  CO2 20* 20* 24 27 26 28 28   GLUCOSE 113* 113* 130* 120* 110* 106* 109*  BUN 36* 34* 37* 31* 26* 27* 26*  CREATININE 2.44* 2.41* 2.37* 1.46* 1.10 1.03 1.06  CALCIUM 8.5* 9.1 8.7* 8.8* 8.8* 8.6* 8.6*  MG 1.9 2.1 2.1 2.0 2.0  --   --      Urinalysis    Component Value Date/Time   COLORURINE YELLOW 10/28/2020 1050   APPEARANCEUR HAZY (A) 10/28/2020 1050   LABSPEC 1.023 10/28/2020 1050   PHURINE 5.0 10/28/2020 1050   GLUCOSEU NEGATIVE 10/28/2020 1050   HGBUR MODERATE (A) 10/28/2020 1050   BILIRUBINUR NEGATIVE 10/28/2020 1050   KETONESUR 20 (A) 10/28/2020 1050   PROTEINUR 30 (A) 10/28/2020  [place] 10/28/2020 1050   LEUKOCYTESUR LARGE (A) 10/28/2020 1050      Time coordinating discharge: 45 minutes  SIGNED:  Vernell Leep, MD, [place], [facility]. [facility]  To contact the attending provider between 7A-7P or the covering provider during  after hours 7P-7A, please log into the web site [email or web address] and access using universal [place] password for that web site. If you do not have the password, please call the hospital operator.

## 2020-11-01 NOTE — Care Management Important Message (Signed)
Important Message  Patient Details  Name: [name] MRN: [id number] Date of Birth: 11-15-1945   Medicare Important Message Given:  Yes     [name] 11/01/2020, 4:03 PM

## 2020-11-08 ENCOUNTER — Ambulatory Visit (HOSPITAL_COMMUNITY): Payer: Medicare Other

## 2020-11-11 ENCOUNTER — Other Ambulatory Visit (HOSPITAL_COMMUNITY): Payer: Self-pay

## 2020-11-11 ENCOUNTER — Telehealth (HOSPITAL_COMMUNITY): Payer: Self-pay | Admitting: Pharmacist

## 2020-11-11 NOTE — Telephone Encounter (Signed)
Pharmacy Transitions of Care Follow-up Telephone Call  Date of discharge: 11/01/2020  Discharge Diagnosis: Atrial Flutter  How have you been since you were released from the hospital? OK   Medication changes made at discharge:  - START: Eliquis, metoprolol, methocarbamol, spiriva, ventolin, oxy, cartia  - STOPPED: ASA, furosemide     Medication Review:   APIXABAN (ELIQUIS)  Apixaban 10 mg BID initiated on 11/01/2020.  - Discussed importance of taking medication around the same time everyday  - Reviewed potential DDIs with patient  - Advised patient of medications to avoid (NSAIDs, ASA)  - Educated that Tylenol (acetaminophen) will be the preferred analgesic to prevent risk of bleeding  - Emphasized importance of monitoring for signs and symptoms of bleeding (abnormal bruising, prolonged bleeding, nose bleeds, bleeding from gums, discolored urine, black tarry stools)  - Advised patient to alert all providers of anticoagulation therapy prior to starting a new medication or having a procedure    Follow-up Appointments:  [facility] f/u appt confirmed? Yes If their condition worsens, is the pt aware to call PCP or go to the Emergency Dept.? Yes

## 2020-11-15 ENCOUNTER — Other Ambulatory Visit: Payer: Self-pay

## 2020-11-15 ENCOUNTER — Inpatient Hospital Stay: Payer: Medicare Other | Attending: Nurse Practitioner | Admitting: Nurse Practitioner

## 2020-11-15 VITALS — BP 97/59 | HR 98 | Temp 98.2°F | Resp 20 | Ht 72.0 in | Wt 144.6 lb

## 2020-11-15 DIAGNOSIS — E119 Type 2 diabetes mellitus without complications: Secondary | ICD-10-CM | POA: Diagnosis not present

## 2020-11-15 DIAGNOSIS — C187 Malignant neoplasm of sigmoid colon: Secondary | ICD-10-CM | POA: Diagnosis not present

## 2020-11-15 DIAGNOSIS — F0390 Unspecified dementia without behavioral disturbance: Secondary | ICD-10-CM | POA: Diagnosis not present

## 2020-11-15 DIAGNOSIS — I10 Essential (primary) hypertension: Secondary | ICD-10-CM | POA: Insufficient documentation

## 2020-11-15 DIAGNOSIS — Z933 Colostomy status: Secondary | ICD-10-CM | POA: Insufficient documentation

## 2020-11-15 DIAGNOSIS — J449 Chronic obstructive pulmonary disease, unspecified: Secondary | ICD-10-CM | POA: Insufficient documentation

## 2020-11-15 DIAGNOSIS — C182 Malignant neoplasm of ascending colon: Secondary | ICD-10-CM | POA: Diagnosis present

## 2020-11-15 DIAGNOSIS — D696 Thrombocytopenia, unspecified: Secondary | ICD-10-CM | POA: Diagnosis not present

## 2020-11-15 DIAGNOSIS — I4892 Unspecified atrial flutter: Secondary | ICD-10-CM | POA: Insufficient documentation

## 2020-11-15 DIAGNOSIS — R0602 Shortness of breath: Secondary | ICD-10-CM | POA: Insufficient documentation

## 2020-11-15 NOTE — Progress Notes (Signed)
[facility] OFFICE PROGRESS NOTE   Diagnosis: Colon cancer  INTERVAL HISTORY:   [name] presents for his first outpatient visit at the [facility] since hospital discharge.  He denies nausea/vomiting.  Colostomy is functioning.  His sons girlfriend is helping with wound care.  He denies bleeding.  He complains of dyspnea on exertion.  Objective:  Vital signs in last 24 hours:  Blood pressure (!) 97/59, pulse 98, temperature 98.2 F (36.8 C), temperature source Oral, resp. rate 20, height 6' (1.829 m), weight 144 lb 9.6 oz (65.6 kg), SpO2 100 %.    Resp: Coarse, distant breath sounds.  No respiratory distress. Cardio: Irregular. GI: Left abdomen colostomy with thick pasty stool in the collection bag.  Midline wound is packed.  Wound edges appear clean. Vascular: No leg edema. Skin: Skin tear left forearm.  Ulcerated wound left hand, irregular border.   Lab Results:  Lab Results  Component Value Date   WBC 4.5 11/01/2020   HGB 8.2 (L) 11/01/2020   HCT 26.9 (L) 11/01/2020   MCV 79.1 (L) 11/01/2020   PLT 94 (L) 11/01/2020    Imaging:  No results found.  Medications: I have reviewed the patient's current medications.  Assessment/Plan: 1.  Sigmoid colon adenocarcinoma -Colonoscopy 10/21/2020- partially obstructing mass found in the sigmoid colon consistent with adenocarcinoma. -CEA on 10/22/2020 was 3.9 -CTs 10/22/2020-colonic mass with pelvic lymphadenopathy including right common iliac and external iliac nodes, small pulmonary nodules -10/24/2020-sigmoid colectomy, end colostomy, tumor stuck to pelvic sidewall with rind of tissue remaining at completion of surgery -Pathology- moderately differentiated adenocarcinoma the sigmoid colon, tumor extends into pericolonic connective tissue, no lymphovascular perineural invasion, 0/14 lymph nodes, carcinoma involves an area with the specimen was disrupted at mesenteric margin 2.  Iron deficiency anemia secondary  to underlying GI malignancy. 3.  Thrombocytopenia 4.  Atrial flutter 5.  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  COPD 7.  Hypertension 8.  Diabetes mellitus 9.  Dementia 10.  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 Martin Majestic who is going to check with some Mohs specialist to see if disease is resectable, they were holding on radiation appointments until this could be explored. -Established care with the wound center on 09/13/2019  Disposition: [name] has been diagnosed with stage II colon cancer, though the mesenteric margin is in question and pelvic lymph nodes and lung nodules were noted on initial CTs.  His case will be presented at the GI tumor conference.  We are referring him for follow-up CT scans in the next 1 to 2 weeks.  We had preliminary discussion regarding adjuvant Xeloda.  He has a midline surgical wound that has not healed.  He understands further healing would be necessary prior to beginning chemotherapy.    He will return for lab and CTs in approximately 1 week.  We will see him in follow-up in approximately 3 weeks.  We are available to see him sooner if needed.  He is being followed by dermatology for the basal cell carcinoma.  Patient seen with Dr. Benay Spice.    Ned Card ANP/GNP-BC   11/15/2020  10:47 AM This was a shared visit with Ned Card.  [name] was interviewed and examined.  I discussed the operative findings with Dr. Donne Hazel.  He indicates the tumor was diffusely "  stuck "to the left abdomen/pelvic sidewall near the iliac.  There was no remaining gross disease at the completion of surgery.  He has an increased risk of developing recurrent colon cancer based on the potentially microscopic positive surgical margin.  We will  obtain restaging CTs prior to a follow-up office visit in 3 weeks.  The CTs are to follow-up on preoperative pelvic lymphadenopathy and lung nodules.  I was present for greater than 50% of today's visit.  I performed medical decision making.  Julieanne Manson, MD

## 2020-11-20 ENCOUNTER — Emergency Department (HOSPITAL_COMMUNITY): Payer: Medicare Other

## 2020-11-20 ENCOUNTER — Other Ambulatory Visit: Payer: Self-pay

## 2020-11-20 ENCOUNTER — Inpatient Hospital Stay (HOSPITAL_COMMUNITY)
Admission: EM | Admit: 2020-11-20 | Discharge: 2020-11-27 | DRG: 377 | Disposition: A | Discharge status: Home Health | Payer: Medicare Other | Attending: Family Medicine | Admitting: Family Medicine

## 2020-11-20 DIAGNOSIS — D649 Anemia, unspecified: Secondary | ICD-10-CM | POA: Diagnosis not present

## 2020-11-20 DIAGNOSIS — Z79899 Other long term (current) drug therapy: Secondary | ICD-10-CM

## 2020-11-20 DIAGNOSIS — Z951 Presence of aortocoronary bypass graft: Secondary | ICD-10-CM

## 2020-11-20 DIAGNOSIS — Z923 Personal history of irradiation: Secondary | ICD-10-CM

## 2020-11-20 DIAGNOSIS — J449 Chronic obstructive pulmonary disease, unspecified: Secondary | ICD-10-CM | POA: Diagnosis present

## 2020-11-20 DIAGNOSIS — I480 Paroxysmal atrial fibrillation: Secondary | ICD-10-CM | POA: Diagnosis present

## 2020-11-20 DIAGNOSIS — K259 Gastric ulcer, unspecified as acute or chronic, without hemorrhage or perforation: Secondary | ICD-10-CM | POA: Diagnosis present

## 2020-11-20 DIAGNOSIS — Z9181 History of falling: Secondary | ICD-10-CM

## 2020-11-20 DIAGNOSIS — Z9049 Acquired absence of other specified parts of digestive tract: Secondary | ICD-10-CM

## 2020-11-20 DIAGNOSIS — D6832 Hemorrhagic disorder due to extrinsic circulating anticoagulants: Secondary | ICD-10-CM | POA: Diagnosis present

## 2020-11-20 DIAGNOSIS — F17290 Nicotine dependence, other tobacco product, uncomplicated: Secondary | ICD-10-CM | POA: Diagnosis present

## 2020-11-20 DIAGNOSIS — Z85038 Personal history of other malignant neoplasm of large intestine: Secondary | ICD-10-CM

## 2020-11-20 DIAGNOSIS — J680 Bronchitis and pneumonitis due to chemicals, gases, fumes and vapors: Secondary | ICD-10-CM | POA: Diagnosis present

## 2020-11-20 DIAGNOSIS — K31811 Angiodysplasia of stomach and duodenum with bleeding: Principal | ICD-10-CM | POA: Diagnosis present

## 2020-11-20 DIAGNOSIS — L299 Pruritus, unspecified: Secondary | ICD-10-CM | POA: Diagnosis present

## 2020-11-20 DIAGNOSIS — C4491 Basal cell carcinoma of skin, unspecified: Secondary | ICD-10-CM | POA: Diagnosis present

## 2020-11-20 DIAGNOSIS — E43 Unspecified severe protein-calorie malnutrition: Secondary | ICD-10-CM | POA: Diagnosis present

## 2020-11-20 DIAGNOSIS — R195 Other fecal abnormalities: Secondary | ICD-10-CM | POA: Diagnosis present

## 2020-11-20 DIAGNOSIS — K922 Gastrointestinal hemorrhage, unspecified: Secondary | ICD-10-CM | POA: Diagnosis not present

## 2020-11-20 DIAGNOSIS — E119 Type 2 diabetes mellitus without complications: Secondary | ICD-10-CM | POA: Diagnosis present

## 2020-11-20 DIAGNOSIS — Z933 Colostomy status: Secondary | ICD-10-CM

## 2020-11-20 DIAGNOSIS — R918 Other nonspecific abnormal finding of lung field: Secondary | ICD-10-CM | POA: Diagnosis present

## 2020-11-20 DIAGNOSIS — R000 Tachycardia, unspecified: Secondary | ICD-10-CM | POA: Diagnosis present

## 2020-11-20 DIAGNOSIS — I119 Hypertensive heart disease without heart failure: Secondary | ICD-10-CM | POA: Diagnosis present

## 2020-11-20 DIAGNOSIS — I959 Hypotension, unspecified: Secondary | ICD-10-CM | POA: Diagnosis present

## 2020-11-20 DIAGNOSIS — R590 Localized enlarged lymph nodes: Secondary | ICD-10-CM | POA: Diagnosis present

## 2020-11-20 DIAGNOSIS — Z960 Presence of urogenital implants: Secondary | ICD-10-CM | POA: Diagnosis present

## 2020-11-20 DIAGNOSIS — F17210 Nicotine dependence, cigarettes, uncomplicated: Secondary | ICD-10-CM | POA: Diagnosis present

## 2020-11-20 DIAGNOSIS — Z86718 Personal history of other venous thrombosis and embolism: Secondary | ICD-10-CM

## 2020-11-20 DIAGNOSIS — D509 Iron deficiency anemia, unspecified: Secondary | ICD-10-CM | POA: Diagnosis present

## 2020-11-20 DIAGNOSIS — D62 Acute posthemorrhagic anemia: Secondary | ICD-10-CM | POA: Diagnosis present

## 2020-11-20 DIAGNOSIS — Z7901 Long term (current) use of anticoagulants: Secondary | ICD-10-CM

## 2020-11-20 DIAGNOSIS — Z681 Body mass index (BMI) 19 or less, adult: Secondary | ICD-10-CM

## 2020-11-20 DIAGNOSIS — I4892 Unspecified atrial flutter: Secondary | ICD-10-CM | POA: Diagnosis present

## 2020-11-20 DIAGNOSIS — Z8249 Family history of ischemic heart disease and other diseases of the circulatory system: Secondary | ICD-10-CM

## 2020-11-20 DIAGNOSIS — D61818 Other pancytopenia: Secondary | ICD-10-CM | POA: Diagnosis present

## 2020-11-20 DIAGNOSIS — Z20822 Contact with and (suspected) exposure to covid-19: Secondary | ICD-10-CM | POA: Diagnosis present

## 2020-11-20 DIAGNOSIS — K5521 Angiodysplasia of colon with hemorrhage: Secondary | ICD-10-CM

## 2020-11-20 DIAGNOSIS — K921 Melena: Secondary | ICD-10-CM

## 2020-11-20 DIAGNOSIS — Z952 Presence of prosthetic heart valve: Secondary | ICD-10-CM

## 2020-11-20 DIAGNOSIS — F0390 Unspecified dementia without behavioral disturbance: Secondary | ICD-10-CM | POA: Diagnosis present

## 2020-11-20 DIAGNOSIS — D696 Thrombocytopenia, unspecified: Secondary | ICD-10-CM | POA: Diagnosis present

## 2020-11-20 LAB — CBC WITH DIFFERENTIAL/PLATELET
Abs Immature Granulocytes: 0.06 10*3/uL (ref 0.00–0.07)
Basophils Absolute: 0 10*3/uL (ref 0.0–0.1)
Basophils Relative: 0 %
Eosinophils Absolute: 0 10*3/uL (ref 0.0–0.5)
Eosinophils Relative: 1 %
HCT: 15 % — ABNORMAL LOW (ref 39.0–52.0)
Hemoglobin: 4.5 g/dL — CL (ref 13.0–17.0)
Immature Granulocytes: 1 %
Lymphocytes Relative: 15 %
Lymphs Abs: 0.7 10*3/uL (ref 0.7–4.0)
MCH: 27.3 pg (ref 26.0–34.0)
MCHC: 30 g/dL (ref 30.0–36.0)
MCV: 90.9 fL (ref 80.0–100.0)
Monocytes Absolute: 0.5 10*3/uL (ref 0.1–1.0)
Monocytes Relative: 11 %
Neutro Abs: 3.2 10*3/uL (ref 1.7–7.7)
Neutrophils Relative %: 72 %
Platelets: 69 10*3/uL — ABNORMAL LOW (ref 150–400)
RBC: 1.65 MIL/uL — ABNORMAL LOW (ref 4.22–5.81)
RDW: 25.1 % — ABNORMAL HIGH (ref 11.5–15.5)
Smear Review: NORMAL
WBC: 4.5 10*3/uL (ref 4.0–10.5)
nRBC: 0.7 % — ABNORMAL HIGH (ref 0.0–0.2)

## 2020-11-20 LAB — BASIC METABOLIC PANEL
Anion gap: 7 (ref 5–15)
BUN: 48 mg/dL — ABNORMAL HIGH (ref 8–23)
CO2: 25 mmol/L (ref 22–32)
Calcium: 8.1 mg/dL — ABNORMAL LOW (ref 8.9–10.3)
Chloride: 110 mmol/L (ref 98–111)
Creatinine, Ser: 1.18 mg/dL (ref 0.61–1.24)
GFR, Estimated: >60 mL/min (ref >60)
Glucose, Bld: 110 mg/dL — ABNORMAL HIGH (ref 70–99)
Potassium: 4.1 mmol/L (ref 3.5–5.1)
Sodium: 142 mmol/L (ref 135–145)

## 2020-11-20 LAB — PREPARE RBC (CROSSMATCH)

## 2020-11-20 LAB — PROTIME-INR
INR: 1.8 — ABNORMAL HIGH (ref 0.8–1.2)
Prothrombin Time: 21.2 seconds — ABNORMAL HIGH (ref 11.4–15.2)

## 2020-11-20 LAB — SARS CORONAVIRUS 2 (TAT 6-24 HRS): SARS Coronavirus 2: NEGATIVE

## 2020-11-20 LAB — TROPONIN I (HIGH SENSITIVITY): Troponin I (High Sensitivity): 8 ng/L (ref <18)

## 2020-11-20 LAB — BRAIN NATRIURETIC PEPTIDE: B Natriuretic Peptide: 232.1 pg/mL — ABNORMAL HIGH (ref 0.0–100.0)

## 2020-11-20 MED ORDER — PANTOPRAZOLE SODIUM 40 MG IV SOLR
40.0000 mg | Freq: Once | INTRAVENOUS | Status: AC
  Administered 2020-11-20: 40 mg via INTRAVENOUS
  Filled 2020-11-20: qty 40

## 2020-11-20 MED ORDER — SODIUM CHLORIDE 0.9 % IV BOLUS
1000.0000 mL | Freq: Once | INTRAVENOUS | Status: AC
  Administered 2020-11-20: 1000 mL via INTRAVENOUS

## 2020-11-20 MED ORDER — TIOTROPIUM BROMIDE MONOHYDRATE 18 MCG IN CAPS: 18.0000 ug | ORAL_CAPSULE | Freq: Every day | RESPIRATORY_TRACT | Status: DC

## 2020-11-20 MED ORDER — UMECLIDINIUM BROMIDE 62.5 MCG/INH IN AEPB
1.0000 | INHALATION_SPRAY | Freq: Every day | RESPIRATORY_TRACT | Status: DC
  Administered 2020-11-20: 1 via RESPIRATORY_TRACT
  Filled 2020-11-20 (×2): qty 7

## 2020-11-20 MED ORDER — SODIUM CHLORIDE 0.9% IV SOLUTION: Freq: Once | INTRAVENOUS | Status: AC

## 2020-11-20 MED ORDER — SODIUM CHLORIDE 0.9 % IV SOLN: INTRAVENOUS | Status: DC

## 2020-11-20 MED ORDER — IPRATROPIUM-ALBUTEROL 0.5-2.5 (3) MG/3ML IN SOLN
3.0000 mL | Freq: Once | RESPIRATORY_TRACT | Status: AC
  Administered 2020-11-20: 3 mL via RESPIRATORY_TRACT
  Filled 2020-11-20: qty 3

## 2020-11-20 MED ORDER — PANTOPRAZOLE SODIUM 40 MG IV SOLR
40.0000 mg | Freq: Two times a day (BID) | INTRAVENOUS | Status: DC
  Administered 2020-11-20 – 2020-11-23 (×6): 40 mg via INTRAVENOUS
  Filled 2020-11-20 (×6): qty 40

## 2020-11-20 NOTE — ED Triage Notes (Signed)
Pt arrives via EMS from home with complaints of SOB upon exertion, weakness, ostomy bag with possible blood. Blood pressure 84/54 No N/V/D Alert and oriented X4 HR 122 CBG 167 99% RA EMS gave 500 mL NS 20g R[place]and

## 2020-11-20 NOTE — Consult Note (Signed)
Consultation  Referring Provider: ERMD / QPYPP Primary Care Physician:  Pcp, No Primary Gastroenterologist:  Dr.Danis  Reason for Consultation: Progressive anemia and dark stool  HPI: [name] is a 75 y.o. male, who had initially been seen in consult when he was admitted in May 2022 with iron deficiency anemia and heme positive stool.  During that admission he had atrial for up flutter with RVR. Work-up with EGD on 10/21/2020 was normal, and a colonoscopy on that same day was found to have a partially obstructing sigmoid colon mass measuring 5 to 7 cm in length. He was evaluated by surgery and underwent sigmoid colectomy and end ileostomy on 10/24/2020.  Path confirmed adenocarcinoma with negative nodes He did well postoperatively, continued to have A. fib and Eliquis was started at the time of discharge. On discharge home his hemoglobin was 8.2 platelets 90,000.  Patient is not a great historian but says he is continued on the Eliquis.  He says that his stool through his ostomy has been very dark since he was discharged to home persistently.  He has not noticed any bright red blood.  He has no complaints of abdominal pain or cramping, no nausea or vomiting and says he has been able to eat.  However he has developed progressive fatigue and significant shortness of breath with ambulation and says he is so weak at this point he cannot walk across the room. Labs in the ER today hemoglobin 4.5/hematocrit 15/MCV of 90/BUN 41/creatinine 1.18. Initial troponin negative.  Platelets 69 He is being followed by oncology/Dr. Benay Spice.-.  CT of the chest abdomen and pelvis on 10/22/2020 showing the large colonic mass, pelvic lymphadenopathy indicative of metastatic disease, small pulmonary nodules possibly representing metastatic lesions.  Patient also has history of COPD, no oxygen use, dementia and hypertension  Past Medical History:  Diagnosis Date   Anemia    Atrial fibrillation ([place])     post operative in 2018   Atrial flutter ([place])    Bacteremia 2018   Basal cell carcinoma    COPD (chronic obstructive pulmonary disease) (HCC)    Dementia (HCC)    Diabetes (HCC)    DVT of deep femoral vein (HCC)    Hypertension    Thrombocytopenia ([place])     Past Surgical History:  Procedure Laterality Date   BIOPSY  10/21/2020   Procedure: BIOPSY;  Surgeon: Doran Stabler, MD;  Location: [facility];  Service: Gastroenterology;;   CYSTOSCOPY W/ URETERAL STENT PLACEMENT N/A 10/24/2020   Procedure: CYSTOSCOPY WITH RETROGRADE PYELOGRAM/URETERAL STENT PLACEMENT;  Surgeon: Ardis Hughs, MD;  Location: [facility];  Service: Urology;  Laterality: N/A;   CYSTOSCOPY WITH URETHRAL DILATATION  10/24/2020   Procedure: URETHRAL DILATATION;  Surgeon: Ardis Hughs, MD;  [facility];  Service: Urology;;   ESOPHAGOGASTRODUODENOSCOPY (EGD) WITH PROPOFOL N/A 10/21/2020   Procedure: ESOPHAGOGASTRODUODENOSCOPY (EGD) WITH PROPOFOL;  Surgeon: Doran Stabler, MD;  Location: [facility];  Service: Gastroenterology;  Laterality: N/A;   MITRAL VALVE REPLACEMENT  07/2016   [facility] for endocarditis   PARTIAL COLECTOMY N/A 10/24/2020   Procedure: OPEN PARTIAL COLECTOMY WTH COLOSTOMY;  Surgeon: Rolm Bookbinder, MD;  Location: [facility];  Service: General;  Laterality: N/A;   SIGMOIDOSCOPY  10/21/2020   Procedure: SIGMOIDOSCOPY;  Surgeon: Doran Stabler, MD;  Location: [facility];  Service: Gastroenterology;;   SUBMUCOSAL INJECTION  10/21/2020   Procedure: SUBMUCOSAL INJECTION;  Surgeon: Doran Stabler, MD;  Location: [facility]  ENDOSCOPY;  Service: Gastroenterology;;  SPOT    Prior to Admission medications   Medication Sig Start Date End Date Taking? Authorizing Provider  acetaminophen (TYLENOL) 325 MG tablet Take 2 tablets (650 mg total) by mouth every 6 (six) hours as needed for mild pain or fever. 11/01/20   Norm Parcel, PA-C  albuterol (VENTOLIN HFA) 108 (90 Base) MCG/ACT inhaler  Inhale 1-2 puffs into the lungs every 6 (six) hours as needed for wheezing or shortness of breath. 11/01/20   Hongalgi, Lenis Dickinson, MD  apixaban (ELIQUIS) 5 MG TABS tablet Take 1 tablet (5 mg total) by mouth 2 (two) times daily. 11/01/20   Hongalgi, Lenis Dickinson, MD  diltiazem (CARDIZEM CD) 120 MG 24 hr capsule Take 1 capsule (120 mg total) by mouth at bedtime. 11/01/20   Hongalgi, Lenis Dickinson, MD  Iron Polysacch Cmplx-B12-FA 150-0.025-1 MG CAPS Take 1 capsule by mouth daily. 11/02/20   Hongalgi, Lenis Dickinson, MD  methocarbamol (ROBAXIN) 500 MG tablet Take 1 tablet (500 mg total) by mouth every 8 (eight) hours as needed for muscle spasms. 11/01/20   Norm Parcel, PA-C  metoprolol tartrate (LOPRESSOR) 50 MG tablet Take 1 tablet (50 mg total) by mouth 2 (two) times daily. 11/01/20   Hongalgi, Lenis Dickinson, MD  Multiple Vitamin (MULTI-VITAMIN DAILY) TABS Take 1 tablet by mouth daily.    [provider]  nicotine (NICODERM CQ - DOSED IN MG/24 HOURS) 14 mg/24hr patch Place 1 patch (14 mg total) onto the skin daily. 11/02/20   Hongalgi, Lenis Dickinson, MD  ondansetron (ZOFRAN) 4 MG tablet Take 1 tablet (4 mg total) by mouth daily as needed for nausea or vomiting. 11/01/20 11/01/21  Norm Parcel, PA-C  oxyCODONE (OXY IR/ROXICODONE) 5 MG immediate release tablet Take 0.5-1 tablets (2.5-5 mg total) by mouth every 6 (six) hours as needed for moderate pain or severe pain. 11/01/20   Norm Parcel, PA-C  polyethylene glycol powder (GLYCOLAX/MIRALAX) 17 GM/SCOOP powder Dissolve 2 capfuls (34 g total) by mouth daily as needed for mild constipation. 11/01/20   Norm Parcel, PA-C  tiotropium (SPIRIVA HANDIHALER) 18 MCG inhalation capsule Place 1 capsule (18 mcg total) into inhaler and inhale daily. 11/01/20   Modena Jansky, MD    Current Facility-Administered Medications  Medication Dose Route Frequency Provider Last Rate Last Admin   0.9 %  sodium chloride infusion (Manually program via Guardrails IV Fluids)   Intravenous Once Deno Etienne,  DO       pantoprazole (PROTONIX) injection 40 mg  40 mg Intravenous Once Deno Etienne, DO       Current Outpatient Medications  Medication Sig Dispense Refill   acetaminophen (TYLENOL) 325 MG tablet Take 2 tablets (650 mg total) by mouth every 6 (six) hours as needed for mild pain or fever.     albuterol (VENTOLIN HFA) 108 (90 Base) MCG/ACT inhaler Inhale 1-2 puffs into the lungs every 6 (six) hours as needed for wheezing or shortness of breath. 18 g 1   apixaban (ELIQUIS) 5 MG TABS tablet Take 1 tablet (5 mg total) by mouth 2 (two) times daily. 60 tablet 1   diltiazem (CARDIZEM CD) 120 MG 24 hr capsule Take 1 capsule (120 mg total) by mouth at bedtime. 30 capsule 1   Iron Polysacch Cmplx-B12-FA 150-0.025-1 MG CAPS Take 1 capsule by mouth daily. 30 capsule 1   methocarbamol (ROBAXIN) 500 MG tablet Take 1 tablet (500 mg total) by mouth every 8 (eight) hours as needed for  muscle spasms. 60 tablet 0   metoprolol tartrate (LOPRESSOR) 50 MG tablet Take 1 tablet (50 mg total) by mouth 2 (two) times daily. 60 tablet 1   Multiple Vitamin (MULTI-VITAMIN DAILY) TABS Take 1 tablet by mouth daily.     nicotine (NICODERM CQ - DOSED IN MG/24 HOURS) 14 mg/24hr patch Place 1 patch (14 mg total) onto the skin daily. 28 patch 0   ondansetron (ZOFRAN) 4 MG tablet Take 1 tablet (4 mg total) by mouth daily as needed for nausea or vomiting. 30 tablet 1   oxyCODONE (OXY IR/ROXICODONE) 5 MG immediate release tablet Take 0.5-1 tablets (2.5-5 mg total) by mouth every 6 (six) hours as needed for moderate pain or severe pain. 30 tablet 0   polyethylene glycol powder (GLYCOLAX/MIRALAX) 17 GM/SCOOP powder Dissolve 2 capfuls (34 g total) by mouth daily as needed for mild constipation. 510 g 0   tiotropium (SPIRIVA HANDIHALER) 18 MCG inhalation capsule Place 1 capsule (18 mcg total) into inhaler and inhale daily. 30 capsule 1    Allergies as of 11/20/2020 - Review Complete 10/24/2020  Allergen Reaction Noted   Orange juice  [orange oil] Hives 09/03/2020    Family History  Problem Relation Age of Onset   Hypertension Other     Social History   Socioeconomic History   Marital status: Single    Spouse name: Not on file   Number of children: Not on file   Years of education: Not on file   Highest education level: Not on file  Occupational History   Not on file  Tobacco Use   Smoking status: Every Day    Years: 62.00    Pack years: 0.00    Types: Cigarettes, Pipe   Smokeless tobacco: Never  Vaping Use   Vaping Use: Never used  Substance and Sexual Activity   Alcohol use: Not Currently    Comment: occasionally drinks beer 09/03/20   Drug use: Not Currently    Comment: previous marijuana use   Sexual activity: Not Currently  Other Topics Concern   Not on file  Social History Narrative   moved from [place] to [place] in 2019 to live with his son    Social Determinants of Radio broadcast assistant Strain: Not on file  Food Insecurity: Not on file  Transportation Needs: Unmet Transportation Needs   Lack of Transportation (Medical): Yes   Lack of Transportation (Non-Medical): Yes  Physical Activity: Not on file  Stress: Not on file  Social Connections: Not on file  Intimate Partner Violence: Not on file    Review of Systems:   Physical Exam: Vital signs in last 24 hours: Pulse Rate:  [102-119] 117 (06/22 1530) Resp:  [15-20] 20 (06/22 1530) BP: (94-105)/(60-87) 105/87 (06/22 1530) SpO2:  [100 %] 100 % (06/22 1530)   General:   Alert,  Well-developed, thin elderly white male, pale, pleasant and cooperative in NAD Head:  Normocephalic and atraumatic. Eyes:  Sclera clear, no icterus.   Conjunctiva pale Ears:  Normal auditory acuity. Nose:  No deformity, discharge,  or lesions. Mouth:  No deformity or lesions.   Neck:  Supple; no masses or thyromegaly. Lungs: Decreased breath sounds bilaterally  heart:   Tachy irreg and rhythm; no murmurs, clicks, rubs,  or  gallops. Abdomen:  Soft,nontender, BS active, ostomy in the left mid quadrant with melenic appearing stool Rectal: Not done Msk:  Symmetrical without gross deformities. . Pulses:  Normal pulses noted. Extremities:  Without clubbing or  edema. Neurologic:  Alert and  oriented x4;  grossly normal neurologically. Skin:  Intact without significant lesions or rashes.. Psych:  Alert and cooperative. Normal mood and affect.  Intake/Output from previous day: No intake/output data recorded. Intake/Output this shift: No intake/output data recorded.  Lab Results: Recent Labs    11/20/20 1406  WBC 4.5  HGB 4.5*  HCT 15.0*  PLT 69*   BMET Recent Labs    11/20/20 1406  NA 142  K 4.1  CL 110  CO2 25  GLUCOSE 110*  BUN 48*  CREATININE 1.18  CALCIUM 8.1*   LFT No results for input(s): PROT, ALBUMIN, AST, ALT, ALKPHOS, BILITOT, BILIDIR, IBILI in the last 72 hours. PT/INR No results for input(s): LABPROT, INR in the last 72 hours. Hepatitis Panel No results for input(s): HEPBSAG, HCVAB, HEPAIGM, HEPBIGM in the last 72 hours.     IMPRESSION:   #51 75 year old white male with very recent diagnosis of adenocarcinoma of the sigmoid colon, partially obstructing, had presented with iron deficiency anemia May 2022 Patient underwent sigmoid colectomy and end ileostomy 10/24/2020 Discharged home on Eliquis with diagnosis of A. fib with rapid RVR during admission Discharge hemoglobin 8.2 Patient presents now with progressive weakness stating that he has had very dark stool through the ostomy since discharge and has had a 4 g drop in hemoglobin over the past 3 weeks.  Suspect ongoing slow GI blood loss in setting of chronic Eliquis.  Etiology not clear possibly oozing from anastomosis, anastomotic ulceration, EGD just prior to surgery was normal.  #2 profound anemia as above #3 thrombocytopenia #4 atrial fibrillation #5 dementia #6 COPD  Plan; Patient will need to be transfused 2 to 3  units of packed RBC slowly IV iron while hospitalized Hold Eliquis Full liquid diet Please consult surgery as patient is only 3 weeks out post sigmoid colectomy He may require repeat EGD, would not want to instrument ostomy as he is only 3 weeks postop.  We will follow with you      Adena Sima PA-C 11/20/2020, 3:55 PM

## 2020-11-20 NOTE — Progress Notes (Signed)
Consulted general surgery- Spoke with Dr. Kae Heller.   [name], DO 11/20/2020, 7:21 PM PGY-2, [place]

## 2020-11-20 NOTE — ED Notes (Signed)
Consent is signed and at bedside for blood transfusion.

## 2020-11-20 NOTE — Progress Notes (Signed)
   Subjective/Chief Complaint: 75 year old gentleman known to our service following Hartman's procedure 4 weeks ago for a partially obstructing sigmoid mass after presenting with symptomatic anemia.  He returns with chief complaint of shortness of breath on exertion which has been going on since he left the hospital.  He notes that he has continued to have dark stool through his ostomy.  Endorses a good appetite. Of note, he is on Eliquis for A. fib. He did have a negative EGD before his surgery last month, but the colonoscopy that was performed at the time was incomplete due to poor prep and really only examined the sigmoid mass.   Objective: Vital signs in last 24 hours: Temp:  [97.6 F (36.4 C)-97.9 F (36.6 C)] 97.9 F (36.6 C) (06/22 1834) Pulse Rate:  [102-119] 110 (06/22 1900) Resp:  [15-21] 21 (06/22 1900) BP: (93-105)/(60-87) 99/61 (06/22 1900) SpO2:  [96 %-100 %] 100 % (06/22 1900) Weight:  [65.3 kg] 65.3 kg (06/22 1737)    Intake/Output from previous day: No intake/output data recorded. Intake/Output this shift: No intake/output data recorded.  Physical exam: Thin, frail appearing elderly male Unlabored respiration Abdomen soft, nontender, midline incision healing well, 15 cm x 1.5 cm granulating wound.  Stoma is not visible due to a completely full bag of melanotic appearing stool  Lab Results:  Recent Labs    11/20/20 1406  WBC 4.5  HGB 4.5*  HCT 15.0*  PLT 69*   BMET Recent Labs    11/20/20 1406  NA 142  K 4.1  CL 110  CO2 25  GLUCOSE 110*  BUN 48*  CREATININE 1.18  CALCIUM 8.1*   PT/INR Recent Labs    11/20/20 1652  LABPROT 21.2*  INR 1.8*   ABG No results for input(s): PHART, HCO3 in the last 72 hours.  Invalid input(s): PCO2, PO2  Studies/Results: DG Chest Port 1 View  Result Date: 11/20/2020 CLINICAL DATA:  Shortness of breath, hypotension EXAM: PORTABLE CHEST 1 VIEW COMPARISON:  10/24/2020 FINDINGS: Cardiomegaly status post  median sternotomy. Both lungs are clear. The visualized skeletal structures are unremarkable. IMPRESSION: Cardiomegaly without acute abnormality of the lungs in AP portable projection. Electronically Signed   By: Eddie Candle M.D.   On: 11/20/2020 14:10    Anti-infectives: Anti-infectives (From admission, onward)    None       Assessment/Plan: A fib/flutter  H/O MVR  COPD/tobacco abuse - smokes pipe Basal cell carcinoma x2 of LUE - currently undergoing radiation therapy DM  H/O HTN Thrombocytopenia  Severe Protein Calorie Malnutrition  Anemia/partial obstructing tumor S/P sigmoid colectomy with end colostomy 10/24/20 Dr. Donne Hazel   Returns with symptomatic anemia, thrombocytopenia, and likely melena per ostomy while on anticoagulation; hgb 4.5  from 8.2 on discharge 3 weeks ago.  Agree with plans per Dr. Doyne Keel note.   Given incomplete colonoscopy prior to his Hartman's, it is possible that he could still have a bleeding source in his colon as well.    Surgery team will follow peripherally.    LOS: 0 days    Clovis Riley 11/20/2020

## 2020-11-20 NOTE — ED Notes (Signed)
Colostomy bag changed at this time. With dark red thick stool noted inside of bag with foul odor

## 2020-11-20 NOTE — ED Notes (Signed)
Received verbal report from [place] at this time

## 2020-11-20 NOTE — ED Notes (Signed)
Noted foul odor when entered the room.

## 2020-11-20 NOTE — H&P (Addendum)
[facility] Admission History and Physical Service Pager: [phone]  Patient name: [name] Medical record number: [id number] Date of birth: 10-03-1945 Age: 75 y.o. Gender: male  Primary Care Provider: Pcp, No Consultants: GI Code Status: FULL  Preferred Emergency Contact: [phone] Larene Pickett (Son)  Chief Complaint: Dyspnea  Assessment and Plan: [name] is a 75 y.o. male presenting with dyspnea 2/2 GI bleed. PMH is significant for anemia, A. fib, bacteremia, MV replacement, basal cell carcinoma, COPD, dementia, pre-diabetes, DVT, HTN, thrombocytopenia, colon cancer (colostomy).  Dyspnea, Symptomatic anemia 2/2 GI bleed s/p sigmoid colectomy and end ileostomy  Presented to the ED today due to dyspnea on exertion as well as feeling weak.  Ongoing since he was last discharged 09/2020.  Associated symptom of dark stools produced from ostomy bag. 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 HR 116. SBP 93-105. On RA, no oxygen requirement at home. Hgb 4.5. Plt 69. S/p 1L. Suspected slow GI blood loss in the setting of chronic anticoagulation with Eliquis for Afib. Will transfuse 2 U pRBC and start IVF.  - Admit to [facility], attending Dr. Erin Hearing - GI consulted, appreciate recs - Surgery to be consulted, appreciate recs - Vitals per routine - Hold home Eliquis - Follow-up post-transfusion H&H - Continue to trend CBC - Full liquid diet - IV iron while hospitalized per GI - NS 126mL/h - SCDs - PT/OT eval - Consult to wound care for ostomy care  Atrial fibrillation HR 110-120. EKG w/ Afib RVR. Diagnosed during last admission in which he had surgery. Patient was discharged home on Eliquis. No chest pain or palpitations at this time. Tachycardia likely due to anemic status. - hold home eliquis in the setting of GI bleed - Hold home lopressor and cardizem due to  soft pressures; resume when appropriate  COPD  Tobacco use disorder Endorse cough and current everyday smoker (pipe tobacco 2x daily). On RA. No oxygen requirement. Expiratory wheeze with normal effort. Uses albuterol and spiriva at home.  -Continue Spiriva -Maintain O2 sats 88-92%  Thrombocytopenia Plt 69. Suspected active bleed. On eliquis for afib. PT/INR pending. Etiology unknown at this time. 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  -Hold eliquis -Monitor on AM CBC -f/u PT/INR  FEN/GI: Full liquid diet Prophylaxis: SCDs  Disposition: Med-Tele  History of Present Illness:  [name] is a 75 y.o. male presenting with shortness of breath.   Dark stools in ostomy since discharge from the hospital 1 month prior. States he feels very weak and is more winded that usually while walking. He does not use any assistive devices to walk normally. He does not have any pain at this time. He lives with his girlfriend and son. He also has home health aide that comes into help. He believes his blood thinner made his blood level so low. He denies headache, dizziness, and palpitations.   Current everyday smoker from tobacco pipe 2x daily. Has cut back on smoking unsure whether is ready to quit.   Denies alcohol use.   Review Of Systems: Per HPI with the following additions:  Review of Systems  Constitutional:  Positive for activity change and fatigue. Negative for chills and fever.  Respiratory:  Positive for cough and shortness of breath.   Cardiovascular:  Negative for chest pain.  Gastrointestinal:  Negative for abdominal pain.  Genitourinary:  Negative for difficulty urinating.  Skin:  Positive  for wound.  Neurological:  Positive for weakness. Negative for dizziness and headaches.    Patient Active Problem List   Diagnosis Date Noted   Protein-calorie malnutrition, severe 10/23/2020   Occult blood in stools    Iron deficiency anemia due to chronic blood loss    Atrial  flutter (HCC) 10/17/2020   Microcytic anemia 10/17/2020   COPD (chronic obstructive pulmonary disease) ([place]) 10/17/2020   Atrial fibrillation with RVR ([place]) 10/17/2020   Basal cell carcinoma (BCC) of skin of left wrist 09/04/2020   Acute bronchitis with COPD ([place]) 01/29/2020   Skin lesions 01/29/2020   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   Hx of bacterial endocarditis 01/29/2020   Frequent falls 01/29/2020   H/O mitral valve replacement 01/29/2020   Past Medical History: Past Medical History:  Diagnosis Date   Anemia    Atrial fibrillation ([place])    post operative in 2018   Atrial flutter ([place])    Bacteremia 2018   Basal cell carcinoma    COPD (chronic obstructive pulmonary disease) (HCC)    Dementia (HCC)    Diabetes (HCC)    DVT of deep femoral vein (HCC)    Hypertension    Thrombocytopenia (HCC)    Past Surgical History: Past Surgical History:  Procedure Laterality Date   BIOPSY  10/21/2020   Procedure: BIOPSY;  Surgeon: Doran Stabler, MD;  Location: [facility];  Service: Gastroenterology;;   CYSTOSCOPY W/ URETERAL STENT PLACEMENT N/A 10/24/2020   Procedure: CYSTOSCOPY WITH RETROGRADE PYELOGRAM/URETERAL STENT PLACEMENT;  Surgeon: Ardis Hughs, MD;  Location: [facility];  Service: Urology;  Laterality: N/A;   CYSTOSCOPY WITH URETHRAL DILATATION  10/24/2020   Procedure: URETHRAL DILATATION;  Surgeon: Ardis Hughs, MD;  Location: [facility] OR;  Service: Urology;;   ESOPHAGOGASTRODUODENOSCOPY (EGD) WITH PROPOFOL N/A 10/21/2020   Procedure: ESOPHAGOGASTRODUODENOSCOPY (EGD) WITH PROPOFOL;  Surgeon: Doran Stabler, MD;  Location: [place] [facility];  Service: Gastroenterology;  Laterality: N/A;   MITRAL VALVE REPLACEMENT  07/2016   [facility] for endocarditis   PARTIAL COLECTOMY N/A 10/24/2020   Procedure: OPEN PARTIAL COLECTOMY WTH COLOSTOMY;  Surgeon: Rolm Bookbinder, MD;  Location: [place];  Service: General;  Laterality: N/A;   SIGMOIDOSCOPY  10/21/2020   Procedure: SIGMOIDOSCOPY;   Surgeon: Doran Stabler, MD;  Location: [place];  Service: Gastroenterology;;   SUBMUCOSAL INJECTION  10/21/2020   Procedure: SUBMUCOSAL INJECTION;  Surgeon: Doran Stabler, MD;  Location: [place];  Service: Gastroenterology;;  SPOT   Social History: Social History   Tobacco Use   Smoking status: Every Day    Years: 62.00    Pack years: 0.00    Types: Cigarettes, Pipe   Smokeless tobacco: Never  Vaping Use   Vaping Use: Never used  Substance Use Topics   Alcohol use: Not Currently    Comment: occasionally drinks beer 09/03/20   Drug use: Not Currently    Comment: previous marijuana use   Additional social history: Lives with girlfriend and son  Please also refer to relevant sections of EMR.  Family History: Family History  Problem Relation Age of Onset   Hypertension Other    Allergies and Medications: Allergies  Allergen Reactions   Orange Juice [Orange Oil] Hives   No current facility-administered medications on file prior to encounter.   Current Outpatient Medications on File Prior to Encounter  Medication Sig Dispense Refill   acetaminophen (TYLENOL) 325 MG tablet Take 2 tablets (650 mg total) by mouth every 6 (  six) hours as needed for mild pain or fever.     albuterol (VENTOLIN HFA) 108 (90 Base) MCG/ACT inhaler Inhale 1-2 puffs into the lungs every 6 (six) hours as needed for wheezing or shortness of breath. 18 g 1   apixaban (ELIQUIS) 5 MG TABS tablet Take 1 tablet (5 mg total) by mouth 2 (two) times daily. 60 tablet 1   diltiazem (CARDIZEM CD) 120 MG 24 hr capsule Take 1 capsule (120 mg total) by mouth at bedtime. 30 capsule 1   Iron Polysacch Cmplx-B12-FA 150-0.025-1 MG CAPS Take 1 capsule by mouth daily. 30 capsule 1   methocarbamol (ROBAXIN) 500 MG tablet Take 1 tablet (500 mg total) by mouth every 8 (eight) hours as needed for muscle spasms. 60 tablet 0   metoprolol tartrate (LOPRESSOR) 50 MG tablet Take 1 tablet (50 mg total) by mouth 2 (two)  times daily. 60 tablet 1   Multiple Vitamin (MULTI-VITAMIN DAILY) TABS Take 1 tablet by mouth daily.     nicotine (NICODERM CQ - DOSED IN MG/24 HOURS) 14 mg/24hr patch Place 1 patch (14 mg total) onto the skin daily. 28 patch 0   ondansetron (ZOFRAN) 4 MG tablet Take 1 tablet (4 mg total) by mouth daily as needed for nausea or vomiting. 30 tablet 1   oxyCODONE (OXY IR/ROXICODONE) 5 MG immediate release tablet Take 0.5-1 tablets (2.5-5 mg total) by mouth every 6 (six) hours as needed for moderate pain or severe pain. 30 tablet 0   polyethylene glycol powder (GLYCOLAX/MIRALAX) 17 GM/SCOOP powder Dissolve 2 capfuls (34 g total) by mouth daily as needed for mild constipation. 510 g 0   tiotropium (SPIRIVA HANDIHALER) 18 MCG inhalation capsule Place 1 capsule (18 mcg total) into inhaler and inhale daily. 30 capsule 1    Objective: BP 105/87   Pulse (!) 117   Resp 20   SpO2 100%  Exam: General -- oriented x3, pleasant and cooperative. Stuttered speech.  HEENT -- Head is normocephalic. PERRLA. EOMI. Ears, nose and throat were benign. Neck -- supple; no bruits. Integument -- intact. Several hand wounds (appreciate my last clinic note) Chest -- Normal effort. Prolong expiratory phase with end expiratory wheezes Cardiac -- Irregularly irregular rate and rhythm. No murmurs noted.  Abdomen -- soft, nontender. No masses palpable. Bowel sounds present. Ostomy bag present with dark stool.  Extremeties - No LE edema present moves all limbs appropriately  Labs and Imaging: CBC BMET  Recent Labs  Lab 11/20/20 1406  WBC 4.5  HGB 4.5*  HCT 15.0*  PLT 69*   Recent Labs  Lab 11/20/20 1406  NA 142  K 4.1  CL 110  CO2 25  BUN 48*  CREATININE 1.18  GLUCOSE 110*  CALCIUM 8.1*     EKG: Afib w/ RVR  PORTABLE CHEST 1 VIEW COMPARISON:  10/24/2020 IMPRESSION: Cardiomegaly without acute abnormality of the lungs in AP portable projection.  [name], DO 11/20/2020, 5:43 PM PGY-2, [place] Intern pager: [phone], text pages welcome

## 2020-11-20 NOTE — H&P (View-Only) (Signed)
Consultation  Referring Provider: ERMD / TWSFK Primary Care Physician:  Pcp, No Primary Gastroenterologist:  Dr.Danis  Reason for Consultation: Progressive anemia and dark stool  HPI: [name] is a 75 y.o. male, who had initially been seen in consult when he was admitted in May 2022 with iron deficiency anemia and heme positive stool.  During that admission he had atrial for up flutter with RVR. Work-up with EGD on 10/21/2020 was normal, and a colonoscopy on that same day was found to have a partially obstructing sigmoid colon mass measuring 5 to 7 cm in length. He was evaluated by surgery and underwent sigmoid colectomy and end ileostomy on 10/24/2020.  Path confirmed adenocarcinoma with negative nodes He did well postoperatively, continued to have A. fib and Eliquis was started at the time of discharge. On discharge home his hemoglobin was 8.2 platelets 90,000.  Patient is not a great historian but says he is continued on the Eliquis.  He says that his stool through his ostomy has been very dark since he was discharged to home persistently.  He has not noticed any bright red blood.  He has no complaints of abdominal pain or cramping, no nausea or vomiting and says he has been able to eat.  However he has developed progressive fatigue and significant shortness of breath with ambulation and says he is so weak at this point he cannot walk across the room. Labs in the ER today hemoglobin 4.5/hematocrit 15/MCV of 90/BUN 41/creatinine 1.18. Initial troponin negative.  Platelets 69 He is being followed by oncology/Dr. Benay Spice.-.  CT of the chest abdomen and pelvis on 10/22/2020 showing the large colonic mass, pelvic lymphadenopathy indicative of metastatic disease, small pulmonary nodules possibly representing metastatic lesions.  Patient also has history of COPD, no oxygen use, dementia and hypertension  Past Medical History:  Diagnosis Date   Anemia    Atrial fibrillation ([place])     post operative in 2018   Atrial flutter ([place])    Bacteremia 2018   Basal cell carcinoma    COPD (chronic obstructive pulmonary disease) (HCC)    Dementia (HCC)    Diabetes (HCC)    DVT of deep femoral vein (HCC)    Hypertension    Thrombocytopenia ([place])     Past Surgical History:  Procedure Laterality Date   BIOPSY  10/21/2020   Procedure: BIOPSY;  Surgeon: Doran Stabler, MD;  Location: [place];  Service: Gastroenterology;;   CYSTOSCOPY W/ URETERAL STENT PLACEMENT N/A 10/24/2020   Procedure: CYSTOSCOPY WITH RETROGRADE PYELOGRAM/URETERAL STENT PLACEMENT;  Surgeon: Ardis Hughs, MD;  Location: [place];  Service: Urology;  Laterality: N/A;   CYSTOSCOPY WITH URETHRAL DILATATION  10/24/2020   Procedure: URETHRAL DILATATION;  Surgeon: Ardis Hughs, MD;  Location: [facility] OR;  Service: Urology;;   ESOPHAGOGASTRODUODENOSCOPY (EGD) WITH PROPOFOL N/A 10/21/2020   Procedure: ESOPHAGOGASTRODUODENOSCOPY (EGD) WITH PROPOFOL;  Surgeon: Doran Stabler, MD;  Location: [place];  Service: Gastroenterology;  Laterality: N/A;   MITRAL VALVE REPLACEMENT  07/2016   [facility] for endocarditis   PARTIAL COLECTOMY N/A 10/24/2020   Procedure: OPEN PARTIAL COLECTOMY WTH COLOSTOMY;  Surgeon: Rolm Bookbinder, MD;  Location: [place];  Service: General;  Laterality: N/A;   SIGMOIDOSCOPY  10/21/2020   Procedure: SIGMOIDOSCOPY;  Surgeon: Doran Stabler, MD;  Location: [place];  Service: Gastroenterology;;   SUBMUCOSAL INJECTION  10/21/2020   Procedure: SUBMUCOSAL INJECTION;  Surgeon: Doran Stabler, MD;  [facility]  ENDOSCOPY;  Service: Gastroenterology;;  SPOT    Prior to Admission medications   Medication Sig Start Date End Date Taking? Authorizing Provider  acetaminophen (TYLENOL) 325 MG tablet Take 2 tablets (650 mg total) by mouth every 6 (six) hours as needed for mild pain or fever. 11/01/20   Norm Parcel, PA-C  albuterol (VENTOLIN HFA) 108 (90 Base) MCG/ACT inhaler  Inhale 1-2 puffs into the lungs every 6 (six) hours as needed for wheezing or shortness of breath. 11/01/20   Hongalgi, Lenis Dickinson, MD  apixaban (ELIQUIS) 5 MG TABS tablet Take 1 tablet (5 mg total) by mouth 2 (two) times daily. 11/01/20   Hongalgi, Lenis Dickinson, MD  diltiazem (CARDIZEM CD) 120 MG 24 hr capsule Take 1 capsule (120 mg total) by mouth at bedtime. 11/01/20   Hongalgi, Lenis Dickinson, MD  Iron Polysacch Cmplx-B12-FA 150-0.025-1 MG CAPS Take 1 capsule by mouth daily. 11/02/20   Hongalgi, Lenis Dickinson, MD  methocarbamol (ROBAXIN) 500 MG tablet Take 1 tablet (500 mg total) by mouth every 8 (eight) hours as needed for muscle spasms. 11/01/20   Norm Parcel, PA-C  metoprolol tartrate (LOPRESSOR) 50 MG tablet Take 1 tablet (50 mg total) by mouth 2 (two) times daily. 11/01/20   Hongalgi, Lenis Dickinson, MD  Multiple Vitamin (MULTI-VITAMIN DAILY) TABS Take 1 tablet by mouth daily.    [provider]  nicotine (NICODERM CQ - DOSED IN MG/24 HOURS) 14 mg/24hr patch Place 1 patch (14 mg total) onto the skin daily. 11/02/20   Hongalgi, Lenis Dickinson, MD  ondansetron (ZOFRAN) 4 MG tablet Take 1 tablet (4 mg total) by mouth daily as needed for nausea or vomiting. 11/01/20 11/01/21  Norm Parcel, PA-C  oxyCODONE (OXY IR/ROXICODONE) 5 MG immediate release tablet Take 0.5-1 tablets (2.5-5 mg total) by mouth every 6 (six) hours as needed for moderate pain or severe pain. 11/01/20   Norm Parcel, PA-C  polyethylene glycol powder (GLYCOLAX/MIRALAX) 17 GM/SCOOP powder Dissolve 2 capfuls (34 g total) by mouth daily as needed for mild constipation. 11/01/20   Norm Parcel, PA-C  tiotropium (SPIRIVA HANDIHALER) 18 MCG inhalation capsule Place 1 capsule (18 mcg total) into inhaler and inhale daily. 11/01/20   Modena Jansky, MD    Current Facility-Administered Medications  Medication Dose Route Frequency Provider Last Rate Last Admin   0.9 %  sodium chloride infusion (Manually program via Guardrails IV Fluids)   Intravenous Once Deno Etienne,  DO       pantoprazole (PROTONIX) injection 40 mg  40 mg Intravenous Once Deno Etienne, DO       Current Outpatient Medications  Medication Sig Dispense Refill   acetaminophen (TYLENOL) 325 MG tablet Take 2 tablets (650 mg total) by mouth every 6 (six) hours as needed for mild pain or fever.     albuterol (VENTOLIN HFA) 108 (90 Base) MCG/ACT inhaler Inhale 1-2 puffs into the lungs every 6 (six) hours as needed for wheezing or shortness of breath. 18 g 1   apixaban (ELIQUIS) 5 MG TABS tablet Take 1 tablet (5 mg total) by mouth 2 (two) times daily. 60 tablet 1   diltiazem (CARDIZEM CD) 120 MG 24 hr capsule Take 1 capsule (120 mg total) by mouth at bedtime. 30 capsule 1   Iron Polysacch Cmplx-B12-FA 150-0.025-1 MG CAPS Take 1 capsule by mouth daily. 30 capsule 1   methocarbamol (ROBAXIN) 500 MG tablet Take 1 tablet (500 mg total) by mouth every 8 (eight) hours as needed for  muscle spasms. 60 tablet 0   metoprolol tartrate (LOPRESSOR) 50 MG tablet Take 1 tablet (50 mg total) by mouth 2 (two) times daily. 60 tablet 1   Multiple Vitamin (MULTI-VITAMIN DAILY) TABS Take 1 tablet by mouth daily.     nicotine (NICODERM CQ - DOSED IN MG/24 HOURS) 14 mg/24hr patch Place 1 patch (14 mg total) onto the skin daily. 28 patch 0   ondansetron (ZOFRAN) 4 MG tablet Take 1 tablet (4 mg total) by mouth daily as needed for nausea or vomiting. 30 tablet 1   oxyCODONE (OXY IR/ROXICODONE) 5 MG immediate release tablet Take 0.5-1 tablets (2.5-5 mg total) by mouth every 6 (six) hours as needed for moderate pain or severe pain. 30 tablet 0   polyethylene glycol powder (GLYCOLAX/MIRALAX) 17 GM/SCOOP powder Dissolve 2 capfuls (34 g total) by mouth daily as needed for mild constipation. 510 g 0   tiotropium (SPIRIVA HANDIHALER) 18 MCG inhalation capsule Place 1 capsule (18 mcg total) into inhaler and inhale daily. 30 capsule 1    Allergies as of 11/20/2020 - Review Complete 10/24/2020  Allergen Reaction Noted   Orange juice  [orange oil] Hives 09/03/2020    Family History  Problem Relation Age of Onset   Hypertension Other     Social History   Socioeconomic History   Marital status: Single    Spouse name: Not on file   Number of children: Not on file   Years of education: Not on file   Highest education level: Not on file  Occupational History   Not on file  Tobacco Use   Smoking status: Every Day    Years: 62.00    Pack years: 0.00    Types: Cigarettes, Pipe   Smokeless tobacco: Never  Vaping Use   Vaping Use: Never used  Substance and Sexual Activity   Alcohol use: Not Currently    Comment: occasionally drinks beer 09/03/20   Drug use: Not Currently    Comment: previous marijuana use   Sexual activity: Not Currently  Other Topics Concern   Not on file  Social History Narrative   moved from [place] to [place] in 2019 to live with his son    Social Determinants of Radio broadcast assistant Strain: Not on file  Food Insecurity: Not on file  Transportation Needs: Unmet Transportation Needs   Lack of Transportation (Medical): Yes   Lack of Transportation (Non-Medical): Yes  Physical Activity: Not on file  Stress: Not on file  Social Connections: Not on file  Intimate Partner Violence: Not on file    Review of Systems:   Physical Exam: Vital signs in last 24 hours: Pulse Rate:  [102-119] 117 (06/22 1530) Resp:  [15-20] 20 (06/22 1530) BP: (94-105)/(60-87) 105/87 (06/22 1530) SpO2:  [100 %] 100 % (06/22 1530)   General:   Alert,  Well-developed, thin elderly white male, pale, pleasant and cooperative in NAD Head:  Normocephalic and atraumatic. Eyes:  Sclera clear, no icterus.   Conjunctiva pale Ears:  Normal auditory acuity. Nose:  No deformity, discharge,  or lesions. Mouth:  No deformity or lesions.   Neck:  Supple; no masses or thyromegaly. Lungs: Decreased breath sounds bilaterally  heart:   Tachy irreg and rhythm; no murmurs, clicks, rubs,  or  gallops. Abdomen:  Soft,nontender, BS active, ostomy in the left mid quadrant with melenic appearing stool Rectal: Not done Msk:  Symmetrical without gross deformities. . Pulses:  Normal pulses noted. Extremities:  Without clubbing or  edema. Neurologic:  Alert and  oriented x4;  grossly normal neurologically. Skin:  Intact without significant lesions or rashes.. Psych:  Alert and cooperative. Normal mood and affect.  Intake/Output from previous day: No intake/output data recorded. Intake/Output this shift: No intake/output data recorded.  Lab Results: Recent Labs    11/20/20 1406  WBC 4.5  HGB 4.5*  HCT 15.0*  PLT 69*   BMET Recent Labs    11/20/20 1406  NA 142  K 4.1  CL 110  CO2 25  GLUCOSE 110*  BUN 48*  CREATININE 1.18  CALCIUM 8.1*   LFT No results for input(s): PROT, ALBUMIN, AST, ALT, ALKPHOS, BILITOT, BILIDIR, IBILI in the last 72 hours. PT/INR No results for input(s): LABPROT, INR in the last 72 hours. Hepatitis Panel No results for input(s): HEPBSAG, HCVAB, HEPAIGM, HEPBIGM in the last 72 hours.     IMPRESSION:   #38 75 year old white male with very recent diagnosis of adenocarcinoma of the sigmoid colon, partially obstructing, had presented with iron deficiency anemia May 2022 Patient underwent sigmoid colectomy and end ileostomy 10/24/2020 Discharged home on Eliquis with diagnosis of A. fib with rapid RVR during admission Discharge hemoglobin 8.2 Patient presents now with progressive weakness stating that he has had very dark stool through the ostomy since discharge and has had a 4 g drop in hemoglobin over the past 3 weeks.  Suspect ongoing slow GI blood loss in setting of chronic Eliquis.  Etiology not clear possibly oozing from anastomosis, anastomotic ulceration, EGD just prior to surgery was normal.  #2 profound anemia as above #3 thrombocytopenia #4 atrial fibrillation #5 dementia #6 COPD  Plan; Patient will need to be transfused 2 to 3  units of packed RBC slowly IV iron while hospitalized Hold Eliquis Full liquid diet Please consult surgery as patient is only 3 weeks out post sigmoid colectomy He may require repeat EGD, would not want to instrument ostomy as he is only 3 weeks postop.  We will follow with you      Charlye Spare PA-C 11/20/2020, 3:55 PM

## 2020-11-20 NOTE — ED Notes (Signed)
Critical value: HGB 4.5, MD notified.

## 2020-11-20 NOTE — Progress Notes (Signed)
The proposed treatment discussed in conference is for discussion purpose only and is not a binding recommendation.  The patients have not been physically examined, or presented with their treatment options.  Therefore, final treatment plans cannot be decided.  

## 2020-11-20 NOTE — ED Provider Notes (Signed)
[facility] EMERGENCY DEPARTMENT Provider Note   CSN: [id number] Arrival date & time: 11/20/20  1313     History Chief Complaint  Patient presents with   Hypotension    [name] is a 75 y.o. male.  75 yo M with a chief complaints of shortness of breath on exertion.  Been going on since he was discharged in the hospital about a month and a half ago.  Patient states that he does not feel like anything is gotten significantly worse but the family was concerned about him today and ended up calling 911.  He has been having dark stool through his ostomy which he says is chronic.  He denies any chest pain.  Has had a very mild cough.  Has COPD at baseline and has had some improvement with his home medications but is transient.  Denies any lower extremity edema.  The history is provided by the patient.  Illness Severity:  Moderate Onset quality:  Gradual Duration:  2 days Timing:  Constant Progression:  Worsening Chronicity:  New Associated symptoms: cough and shortness of breath   Associated symptoms: no abdominal pain, no chest pain, no congestion, no diarrhea, no fever, no headaches, no myalgias, no rash and no vomiting       Past Medical History:  Diagnosis Date   Anemia    Atrial fibrillation (HCC)    post operative in 2018   Atrial flutter ([place])    Bacteremia 2018   Basal cell carcinoma    COPD (chronic obstructive pulmonary disease) (HCC)    Dementia (HCC)    Diabetes (HCC)    DVT of deep femoral vein (HCC)    Hypertension    Thrombocytopenia (HCC)     Patient Active Problem List   Diagnosis Date Noted   Protein-calorie malnutrition, severe 10/23/2020   Occult blood in stools    Iron deficiency anemia due to chronic blood loss    Atrial flutter (HCC) 10/17/2020   Microcytic anemia 10/17/2020   COPD (chronic obstructive pulmonary disease) ([place]) 10/17/2020   Atrial fibrillation with RVR ([place]) 10/17/2020   Basal cell carcinoma (BCC) of  skin of left wrist 09/04/2020   Acute bronchitis with COPD ([place]) 01/29/2020   Skin lesions 01/29/2020   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   Hx of bacterial endocarditis 01/29/2020   Frequent falls 01/29/2020   H/O mitral valve replacement 01/29/2020    Past Surgical History:  Procedure Laterality Date   BIOPSY  10/21/2020   Procedure: BIOPSY;  Surgeon: Doran Stabler, MD;  Location: [place];  Service: Gastroenterology;;   CYSTOSCOPY W/ URETERAL STENT PLACEMENT N/A 10/24/2020   Procedure: CYSTOSCOPY WITH RETROGRADE PYELOGRAM/URETERAL STENT PLACEMENT;  Surgeon: Ardis Hughs, MD;  Location: [place];  Service: Urology;  Laterality: N/A;   CYSTOSCOPY WITH URETHRAL DILATATION  10/24/2020   Procedure: URETHRAL DILATATION;  Surgeon: Ardis Hughs, MD;  Location[facility];  Service: Urology;;   ESOPHAGOGASTRODUODENOSCOPY (EGD) WITH PROPOFOL N/A 10/21/2020   Procedure: ESOPHAGOGASTRODUODENOSCOPY (EGD) WITH PROPOFOL;  Surgeon: Doran Stabler, MD;  Location: [place];  Service: Gastroenterology;  Laterality: N/A;   MITRAL VALVE REPLACEMENT  07/2016   [facility] for endocarditis   PARTIAL COLECTOMY N/A 10/24/2020   Procedure: OPEN PARTIAL COLECTOMY WTH COLOSTOMY;  Surgeon: Rolm Bookbinder, MD;  Location: [place];  Service: General;  Laterality: N/A;   SIGMOIDOSCOPY  10/21/2020   Procedure: SIGMOIDOSCOPY;  Surgeon: Doran Stabler, MD;  Location: [place] [facility];  Service: Gastroenterology;;   [place] INJECTION  10/21/2020   Procedure: SUBMUCOSAL INJECTION;  Surgeon: Doran Stabler, MD;  Location: [place];  Service: Gastroenterology;;  SPOT       Family History  Problem Relation Age of Onset   Hypertension Other     Social History   Tobacco Use   Smoking status: Every Day    Years: 62.00    Pack years: 0.00    Types: Cigarettes, Pipe   Smokeless tobacco: Never  Vaping Use   Vaping Use: Never used  Substance Use Topics   Alcohol use: Not Currently    Comment:  occasionally drinks beer 09/03/20   Drug use: Not Currently    Comment: previous marijuana use    Home Medications Prior to Admission medications   Medication Sig Start Date End Date Taking? Authorizing Provider  acetaminophen (TYLENOL) 325 MG tablet Take 2 tablets (650 mg total) by mouth every 6 (six) hours as needed for mild pain or fever. 11/01/20   Norm Parcel, PA-C  albuterol (VENTOLIN HFA) 108 (90 Base) MCG/ACT inhaler Inhale 1-2 puffs into the lungs every 6 (six) hours as needed for wheezing or shortness of breath. 11/01/20   Hongalgi, Lenis Dickinson, MD  apixaban (ELIQUIS) 5 MG TABS tablet Take 1 tablet (5 mg total) by mouth 2 (two) times daily. 11/01/20   Hongalgi, Lenis Dickinson, MD  diltiazem (CARDIZEM CD) 120 MG 24 hr capsule Take 1 capsule (120 mg total) by mouth at bedtime. 11/01/20   Hongalgi, Lenis Dickinson, MD  Iron Polysacch Cmplx-B12-FA 150-0.025-1 MG CAPS Take 1 capsule by mouth daily. 11/02/20   Hongalgi, Lenis Dickinson, MD  methocarbamol (ROBAXIN) 500 MG tablet Take 1 tablet (500 mg total) by mouth every 8 (eight) hours as needed for muscle spasms. 11/01/20   Norm Parcel, PA-C  metoprolol tartrate (LOPRESSOR) 50 MG tablet Take 1 tablet (50 mg total) by mouth 2 (two) times daily. 11/01/20   Hongalgi, Lenis Dickinson, MD  Multiple Vitamin (MULTI-VITAMIN DAILY) TABS Take 1 tablet by mouth daily.    [provider]  nicotine (NICODERM CQ - DOSED IN MG/24 HOURS) 14 mg/24hr patch Place 1 patch (14 mg total) onto the skin daily. 11/02/20   Hongalgi, Lenis Dickinson, MD  ondansetron (ZOFRAN) 4 MG tablet Take 1 tablet (4 mg total) by mouth daily as needed for nausea or vomiting. 11/01/20 11/01/21  Norm Parcel, PA-C  oxyCODONE (OXY IR/ROXICODONE) 5 MG immediate release tablet Take 0.5-1 tablets (2.5-5 mg total) by mouth every 6 (six) hours as needed for moderate pain or severe pain. 11/01/20   Norm Parcel, PA-C  polyethylene glycol powder (GLYCOLAX/MIRALAX) 17 GM/SCOOP powder Dissolve 2 capfuls (34 g total) by mouth daily  as needed for mild constipation. 11/01/20   Norm Parcel, PA-C  tiotropium (SPIRIVA HANDIHALER) 18 MCG inhalation capsule Place 1 capsule (18 mcg total) into inhaler and inhale daily. 11/01/20   Hongalgi, Lenis Dickinson, MD    Allergies    Orange juice Haig Prophet oil]  Review of Systems   Review of Systems  Constitutional:  Negative for chills and fever.  HENT:  Negative for congestion and facial swelling.   Eyes:  Negative for discharge and visual disturbance.  Respiratory:  Positive for cough and shortness of breath.   Cardiovascular:  Negative for chest pain and palpitations.  Gastrointestinal:  Positive for blood in stool. Negative for abdominal pain, diarrhea and vomiting.  Musculoskeletal:  Negative for arthralgias and myalgias.  Skin:  Negative for color change and rash.  Neurological:  Negative for tremors, syncope and headaches.  Psychiatric/Behavioral:  Negative for confusion and dysphoric mood.    Physical Exam Updated Vital Signs BP 94/67 (BP Location: Left Arm)   Pulse (!) 102   Resp 15   SpO2 100%   Physical Exam Vitals and nursing note reviewed.  Constitutional:      Appearance: He is well-developed.  HENT:     Head: Normocephalic and atraumatic.  Eyes:     Pupils: Pupils are equal, round, and reactive to light.  Neck:     Vascular: No JVD.  Cardiovascular:     Rate and Rhythm: Normal rate and regular rhythm.     Heart sounds: No murmur heard.   No friction rub. No gallop.  Pulmonary:     Effort: No respiratory distress.     Breath sounds: No wheezing.  Abdominal:     General: There is no distension.     Tenderness: There is no abdominal tenderness. There is no guarding or rebound.     Comments: Dark stool noted in the ostomy.  No significant tenderness no significant erythema or induration surrounding.  Musculoskeletal:        General: Normal range of motion.     Cervical back: Normal range of motion and neck supple.  Skin:    Coloration: Skin is not pale.      Findings: No rash.  Neurological:     Mental Status: He is alert and oriented to person, place, and time.  Psychiatric:        Behavior: Behavior normal.    ED Results / Procedures / Treatments   Labs (all labs ordered are listed, but only abnormal results are displayed) Labs Reviewed  CBC WITH DIFFERENTIAL/PLATELET - Abnormal; Notable for the following components:      Result Value   RBC 1.65 (*)    Hemoglobin 4.5 (*)    HCT 15.0 (*)    RDW 25.1 (*)    Platelets 69 (*)    nRBC 0.7 (*)    All other components within normal limits  BASIC METABOLIC PANEL - Abnormal; Notable for the following components:   Glucose, Bld 110 (*)    BUN 48 (*)    Calcium 8.1 (*)    All other components within normal limits  BRAIN NATRIURETIC PEPTIDE  I-STAT CHEM 8, ED  TYPE AND SCREEN  PREPARE RBC (CROSSMATCH)  TROPONIN I (HIGH SENSITIVITY)    EKG None  Radiology DG Chest Port 1 View  Result Date: 11/20/2020 CLINICAL DATA:  Shortness of breath, hypotension EXAM: PORTABLE CHEST 1 VIEW COMPARISON:  10/24/2020 FINDINGS: Cardiomegaly status post median sternotomy. Both lungs are clear. The visualized skeletal structures are unremarkable. IMPRESSION: Cardiomegaly without acute abnormality of the lungs in AP portable projection. Electronically Signed   By: Eddie Candle M.D.   On: 11/20/2020 14:10    Procedures Procedures   Medications Ordered in ED Medications  0.9 %  sodium chloride infusion (Manually program via Guardrails IV Fluids) (has no administration in time range)  pantoprazole (PROTONIX) injection 40 mg (has no administration in time range)  sodium chloride 0.9 % bolus 1,000 mL (1,000 mLs Intravenous New Bag/Given 11/20/20 1456)  ipratropium-albuterol (DUONEB) 0.5-2.5 (3) MG/3ML nebulizer solution 3 mL (3 mLs Nebulization Given 11/20/20 1458)    ED Course  I have reviewed the triage vital signs and the nursing notes.  Pertinent labs & imaging results that were available during my  care  of the patient were reviewed by me and considered in my medical decision making (see chart for details).    MDM Rules/Calculators/A&P                          75 yo M with a chief complaints of shortness of breath on exertion.  Going on since he was last discharged in the hospital.  Family was some concern called EMS today for evaluation.  Reportedly had an episode of hypotension.  Will obtain a laboratory evaluation portable chest x-ray breathing treatments reassess.  Discussed with Amy Esterwood low [facility] will come and consult on the patient.  CRITICAL CARE Performed by: Cecilio Asper   Total critical care time: 35 minutes  Critical care time was exclusive of separately billable procedures and treating other patients.  Critical care was necessary to treat or prevent imminent or life-threatening deterioration.  Critical care was time spent personally by me on the following activities: development of treatment plan with patient and/or surrogate as well as nursing, discussions with consultants, evaluation of patient's response to treatment, examination of patient, obtaining history from patient or surrogate, ordering and performing treatments and interventions, ordering and review of laboratory studies, ordering and review of radiographic studies, pulse oximetry and re-evaluation of patient's condition.   The patients results and plan were reviewed and discussed.   Any x-rays performed were independently reviewed by myself.   Differential diagnosis were considered with the presenting HPI.  Medications  0.9 %  sodium chloride infusion (Manually program via Guardrails IV Fluids) (has no administration in time range)  pantoprazole (PROTONIX) injection 40 mg (has no administration in time range)  sodium chloride 0.9 % bolus 1,000 mL (1,000 mLs Intravenous New Bag/Given 11/20/20 1456)  ipratropium-albuterol (DUONEB) 0.5-2.5 (3) MG/3ML nebulizer solution 3 mL (3 mLs  Nebulization Given 11/20/20 1458)    Vitals:   11/20/20 1430 11/20/20 1501  BP: 99/60 94/67  Pulse: (!) 119 (!) 102  Resp: 15 15  SpO2: 100% 100%    Final diagnoses:  Upper GI bleed  Symptomatic anemia    Admission/ observation were discussed with the admitting physician, patient and/or family and they are comfortable with the plan.   Final Clinical Impression(s) / ED Diagnoses Final diagnoses:  Upper GI bleed  Symptomatic anemia    Rx / DC Orders ED Discharge Orders     None        Deno Etienne, DO 11/20/20 1529

## 2020-11-21 ENCOUNTER — Encounter (HOSPITAL_COMMUNITY): Payer: Self-pay | Admitting: Family Medicine

## 2020-11-21 ENCOUNTER — Observation Stay (HOSPITAL_COMMUNITY): Payer: Medicare Other | Admitting: Anesthesiology

## 2020-11-21 ENCOUNTER — Inpatient Hospital Stay: Payer: Medicare Other

## 2020-11-21 ENCOUNTER — Ambulatory Visit (HOSPITAL_BASED_OUTPATIENT_CLINIC_OR_DEPARTMENT_OTHER): Admission: RE | Admit: 2020-11-21 | Payer: Medicare Other | Source: Ambulatory Visit

## 2020-11-21 ENCOUNTER — Encounter (HOSPITAL_COMMUNITY)
Admission: EM | Disposition: A | Discharge status: Home Health | Payer: Self-pay | Source: Home / Self Care | Attending: Family Medicine

## 2020-11-21 DIAGNOSIS — R000 Tachycardia, unspecified: Secondary | ICD-10-CM | POA: Diagnosis present

## 2020-11-21 DIAGNOSIS — C182 Malignant neoplasm of ascending colon: Secondary | ICD-10-CM | POA: Diagnosis not present

## 2020-11-21 DIAGNOSIS — J449 Chronic obstructive pulmonary disease, unspecified: Secondary | ICD-10-CM | POA: Diagnosis present

## 2020-11-21 DIAGNOSIS — Z933 Colostomy status: Secondary | ICD-10-CM | POA: Diagnosis not present

## 2020-11-21 DIAGNOSIS — K5521 Angiodysplasia of colon with hemorrhage: Secondary | ICD-10-CM | POA: Diagnosis present

## 2020-11-21 DIAGNOSIS — K922 Gastrointestinal hemorrhage, unspecified: Secondary | ICD-10-CM | POA: Diagnosis not present

## 2020-11-21 DIAGNOSIS — D61818 Other pancytopenia: Secondary | ICD-10-CM | POA: Diagnosis present

## 2020-11-21 DIAGNOSIS — D509 Iron deficiency anemia, unspecified: Secondary | ICD-10-CM | POA: Diagnosis present

## 2020-11-21 DIAGNOSIS — D6832 Hemorrhagic disorder due to extrinsic circulating anticoagulants: Secondary | ICD-10-CM | POA: Diagnosis present

## 2020-11-21 DIAGNOSIS — K2970 Gastritis, unspecified, without bleeding: Secondary | ICD-10-CM | POA: Diagnosis not present

## 2020-11-21 DIAGNOSIS — I119 Hypertensive heart disease without heart failure: Secondary | ICD-10-CM | POA: Diagnosis present

## 2020-11-21 DIAGNOSIS — K31811 Angiodysplasia of stomach and duodenum with bleeding: Secondary | ICD-10-CM | POA: Diagnosis present

## 2020-11-21 DIAGNOSIS — I480 Paroxysmal atrial fibrillation: Secondary | ICD-10-CM | POA: Diagnosis present

## 2020-11-21 DIAGNOSIS — Z952 Presence of prosthetic heart valve: Secondary | ICD-10-CM | POA: Diagnosis not present

## 2020-11-21 DIAGNOSIS — Z681 Body mass index (BMI) 19 or less, adult: Secondary | ICD-10-CM | POA: Diagnosis not present

## 2020-11-21 DIAGNOSIS — J680 Bronchitis and pneumonitis due to chemicals, gases, fumes and vapors: Secondary | ICD-10-CM | POA: Diagnosis present

## 2020-11-21 DIAGNOSIS — D62 Acute posthemorrhagic anemia: Secondary | ICD-10-CM | POA: Diagnosis present

## 2020-11-21 DIAGNOSIS — Z85038 Personal history of other malignant neoplasm of large intestine: Secondary | ICD-10-CM | POA: Diagnosis not present

## 2020-11-21 DIAGNOSIS — K921 Melena: Secondary | ICD-10-CM | POA: Diagnosis not present

## 2020-11-21 DIAGNOSIS — Z20822 Contact with and (suspected) exposure to covid-19: Secondary | ICD-10-CM | POA: Diagnosis present

## 2020-11-21 DIAGNOSIS — C187 Malignant neoplasm of sigmoid colon: Secondary | ICD-10-CM | POA: Diagnosis not present

## 2020-11-21 DIAGNOSIS — D696 Thrombocytopenia, unspecified: Secondary | ICD-10-CM | POA: Diagnosis present

## 2020-11-21 DIAGNOSIS — R195 Other fecal abnormalities: Secondary | ICD-10-CM | POA: Diagnosis present

## 2020-11-21 DIAGNOSIS — K5520 Angiodysplasia of colon without hemorrhage: Secondary | ICD-10-CM | POA: Diagnosis not present

## 2020-11-21 DIAGNOSIS — Z9049 Acquired absence of other specified parts of digestive tract: Secondary | ICD-10-CM | POA: Diagnosis not present

## 2020-11-21 DIAGNOSIS — K31819 Angiodysplasia of stomach and duodenum without bleeding: Secondary | ICD-10-CM | POA: Diagnosis not present

## 2020-11-21 DIAGNOSIS — I959 Hypotension, unspecified: Secondary | ICD-10-CM | POA: Diagnosis present

## 2020-11-21 DIAGNOSIS — D649 Anemia, unspecified: Secondary | ICD-10-CM

## 2020-11-21 DIAGNOSIS — I4892 Unspecified atrial flutter: Secondary | ICD-10-CM | POA: Diagnosis present

## 2020-11-21 DIAGNOSIS — K259 Gastric ulcer, unspecified as acute or chronic, without hemorrhage or perforation: Secondary | ICD-10-CM | POA: Diagnosis present

## 2020-11-21 DIAGNOSIS — E119 Type 2 diabetes mellitus without complications: Secondary | ICD-10-CM | POA: Diagnosis present

## 2020-11-21 DIAGNOSIS — D500 Iron deficiency anemia secondary to blood loss (chronic): Secondary | ICD-10-CM | POA: Diagnosis not present

## 2020-11-21 DIAGNOSIS — E43 Unspecified severe protein-calorie malnutrition: Secondary | ICD-10-CM | POA: Diagnosis present

## 2020-11-21 DIAGNOSIS — F0390 Unspecified dementia without behavioral disturbance: Secondary | ICD-10-CM | POA: Diagnosis present

## 2020-11-21 HISTORY — PX: ENTEROSCOPY: SHX5533

## 2020-11-21 HISTORY — PX: BIOPSY: SHX5522

## 2020-11-21 LAB — CBC
HCT: 18.8 % — ABNORMAL LOW (ref 39.0–52.0)
Hemoglobin: 6.1 g/dL — CL (ref 13.0–17.0)
MCH: 28.9 pg (ref 26.0–34.0)
MCHC: 32.4 g/dL (ref 30.0–36.0)
MCV: 89.1 fL (ref 80.0–100.0)
Platelets: 62 10*3/uL — ABNORMAL LOW (ref 150–400)
RBC: 2.11 MIL/uL — ABNORMAL LOW (ref 4.22–5.81)
RDW: 19.6 % — ABNORMAL HIGH (ref 11.5–15.5)
WBC: 3.8 10*3/uL — ABNORMAL LOW (ref 4.0–10.5)
nRBC: 1.1 % — ABNORMAL HIGH (ref 0.0–0.2)

## 2020-11-21 LAB — POCT I-STAT, CHEM 8
BUN: 28 mg/dL — ABNORMAL HIGH (ref 8–23)
Calcium, Ion: 1.17 mmol/L (ref 1.15–1.40)
Chloride: 109 mmol/L (ref 98–111)
Creatinine, Ser: 0.9 mg/dL (ref 0.61–1.24)
Glucose, Bld: 91 mg/dL (ref 70–99)
HCT: 20 % — ABNORMAL LOW (ref 39.0–52.0)
Hemoglobin: 6.8 g/dL — CL (ref 13.0–17.0)
Potassium: 4.2 mmol/L (ref 3.5–5.1)
Sodium: 143 mmol/L (ref 135–145)
TCO2: 24 mmol/L (ref 22–32)

## 2020-11-21 LAB — HEMOGLOBIN AND HEMATOCRIT, BLOOD
HCT: 24.1 % — ABNORMAL LOW (ref 39.0–52.0)
HCT: 23 % — ABNORMAL LOW (ref 39.0–52.0)
Hemoglobin: 8 g/dL — ABNORMAL LOW (ref 13.0–17.0)
Hemoglobin: 7.8 g/dL — ABNORMAL LOW (ref 13.0–17.0)

## 2020-11-21 LAB — PREPARE RBC (CROSSMATCH)

## 2020-11-21 SURGERY — ENTEROSCOPY
Anesthesia: Monitor Anesthesia Care

## 2020-11-21 MED ORDER — PEG-KCL-NACL-NASULF-NA ASC-C 100 G PO SOLR: 0.5000 | Freq: Once | ORAL | Status: DC

## 2020-11-21 MED ORDER — SODIUM CHLORIDE 0.9 % IV SOLN: INTRAVENOUS | Status: DC

## 2020-11-21 MED ORDER — SODIUM CHLORIDE 0.9 % IV SOLN: INTRAVENOUS | Status: DC | PRN

## 2020-11-21 MED ORDER — PROPOFOL 10 MG/ML IV BOLUS
INTRAVENOUS | Status: DC | PRN
  Administered 2020-11-21 (×3): 20 mg via INTRAVENOUS

## 2020-11-21 MED ORDER — PROPOFOL 500 MG/50ML IV EMUL
INTRAVENOUS | Status: DC | PRN
  Administered 2020-11-21: 75 ug/kg/min via INTRAVENOUS

## 2020-11-21 MED ORDER — PEG-KCL-NACL-NASULF-NA ASC-C 100 G PO SOLR
0.5000 | Freq: Once | ORAL | Status: AC
  Administered 2020-11-21: 100 g via ORAL
  Filled 2020-11-21: qty 1

## 2020-11-21 MED ORDER — DILTIAZEM HCL 60 MG PO TABS
30.0000 mg | ORAL_TABLET | Freq: Once | ORAL | Status: AC
  Administered 2020-11-21: 30 mg via ORAL
  Filled 2020-11-21: qty 1

## 2020-11-21 MED ORDER — METOCLOPRAMIDE HCL 5 MG/ML IJ SOLN
10.0000 mg | Freq: Once | INTRAMUSCULAR | Status: AC
  Administered 2020-11-21: 10 mg via INTRAVENOUS
  Filled 2020-11-21: qty 2

## 2020-11-21 MED ORDER — BISACODYL 5 MG PO TBEC
20.0000 mg | DELAYED_RELEASE_TABLET | Freq: Once | ORAL | Status: AC
  Administered 2020-11-21: 20 mg via ORAL
  Filled 2020-11-21: qty 4

## 2020-11-21 MED ORDER — SODIUM CHLORIDE 0.9% IV SOLUTION: Freq: Once | INTRAVENOUS | Status: AC

## 2020-11-21 MED ORDER — SODIUM CHLORIDE 0.9% IV SOLUTION: Freq: Once | INTRAVENOUS | Status: DC

## 2020-11-21 NOTE — Progress Notes (Signed)
Patient ID: [name], male   DOB: 1946-05-21, 75 y.o.   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  Hemoglobin 6.1 this a.m. post 2 units yesterday, platelets 62 INR 1.8  Patient has received 3 unit of packed RBCs and follow-up hemoglobin pending  He has been hemodynamically stable with systolic BP of 546  No new complaints, awaiting procedure      Objective   Vital signs in last 24 hours: Temp:  [97.6 F (36.4 C)-98.6 F (37 C)] 98 F (36.7 C) (06/23 0805) Pulse Rate:  [94-127] 127 (06/23 0805) Resp:  [14-28] 18 (06/23 0805) BP: (93-120)/(56-87) 120/69 (06/23 0805) SpO2:  [96 %-100 %] 98 % (06/23 0805) Weight:  [65.3 kg] 65.3 kg (06/22 1737)   General:    Elderly white male in NAD Heart:  Regular rate and rhythm; no murmurs Lungs: Respirations even and unlabored, lungs CTA bilaterally Abdomen:  Soft, nontender and nondistended. Normal bowel sounds, ostomy in the left mid quadrant melenic appearing stool Extremities:  Without edema. Neurologic:  Alert and oriented,  grossly normal neurologically. Psych:  Cooperative. Normal mood and affect.  Intake/Output from previous day: 06/22 0701 - 06/23 0700 In: 1487.8 [I.V.:49; Blood:438.8; IV Piggyback:1000] Out: -  Intake/Output this shift: Total I/O In: 315 [Blood:315] Out: -   Lab Results: Recent Labs    11/20/20 1406 11/21/20 0435  WBC 4.5 3.8*  HGB 4.5* 6.1*  HCT 15.0* 18.8*  PLT 69* 62*   BMET Recent Labs    11/20/20 1406  NA 142  K 4.1  CL 110  CO2 25  GLUCOSE 110*  BUN 48*  CREATININE 1.18  CALCIUM 8.1*   LFT No results for input(s): PROT, ALBUMIN, AST, ALT, ALKPHOS, BILITOT, BILIDIR, IBILI in the last 72 hours. PT/INR Recent Labs    11/20/20 1652  LABPROT 21.2*  INR 1.8*    Studies/Results: DG  Chest Port 1 View  Result Date: 11/20/2020 CLINICAL DATA:  Shortness of breath, hypotension EXAM: PORTABLE CHEST 1 VIEW COMPARISON:  10/24/2020 FINDINGS: Cardiomegaly status post median sternotomy. Both lungs are clear. The visualized skeletal structures are unremarkable. IMPRESSION: Cardiomegaly without acute abnormality of the lungs in AP portable projection. Electronically Signed   By: Eddie Candle M.D.   On: 11/20/2020 14:10       Assessment / Plan:    #38 75 year old active white male with subacute GI bleeding on chronic Eliquis over the past several weeks who is status post sigmoid colectomy with end colostomy l for partially obstructing the colonic adenocarcinoma 10/24/2020. He reports that melenic appearing stool chronically since discharge from the hospital  Presented yesterday with worsening weakness and hemoglobin of 4 range  Eliquis is on hold Was transfused 2 units of packed RBCs and hemoglobin 6 this a.m. he has received 1/3 unit,, may need 1 more unit  #2 A. fib with RVR-placed on Eliquis at the time of his recent admission #3 thrombocytopenia #4 dementia 5.  COPD  Plan is for EGD/enteroscopy today. Further recommendations pending findings      Active Problems:   Symptomatic anemia     LOS: 0 days   Akila Batta PA-C 11/21/2020, 8:51 AM

## 2020-11-21 NOTE — Anesthesia Postprocedure Evaluation (Signed)
Anesthesia Post Note  Patient: [name]  Procedure(s) Performed: ENTEROSCOPY BIOPSY     Patient location during evaluation: PACU Anesthesia Type: MAC Level of consciousness: awake and alert Pain management: pain level controlled Vital Signs Assessment: post-procedure vital signs reviewed and stable Respiratory status: spontaneous breathing, nonlabored ventilation and respiratory function stable Cardiovascular status: blood pressure returned to baseline and stable Postop Assessment: no apparent nausea or vomiting Anesthetic complications: no Comments: duoneb in pacu for SOB   No notable events documented.  Last Vitals:  Vitals:   11/21/20 1138 11/21/20 1147  BP: (!) 126/53 (!) 135/56  Pulse: (!) 120 (!) 117  Resp: 18 18  Temp:    SpO2: 100% 100%    Last Pain:  Vitals:   11/21/20 1147  TempSrc:   PainSc: 0-No pain                 Pervis Hocking

## 2020-11-21 NOTE — Consult Note (Signed)
[place] Nurse ostomy consult note Consult performed remotely after review of the progress notes.  Pt is familiar to [facility] team from recent admission; colostomy surgery was performed on 10/24/20 and patient and family received several post-op teaching sessions at that time.  Pt was evaluated by Dr Kae Heller on 6/22. Pouch change was performed by ED nurse on the evening of 6/22, according to progress notes.   Instructions  provided for bedside nurses to perform: Ostomy pouching: change twice a week and PRN if leaking: 2pc. 2 and 3/4 inches pouching system. Order and keep at the bedside ostomy pouch, Kellie Simmering #649 and skin barrier, Kellie Simmering #2; barrier rings, Kellie Simmering [phone]. Please re-consult if further assistance is needed.  Gae Dry MSN, RN, [place], [place], [place]        Revision History

## 2020-11-21 NOTE — Progress Notes (Signed)
Pt to get movieprep, will obtain ostomy sleeve for ease of emptying large amount coming out of the ostomy. Pt has 2 cancerous areas on his left arm, the lower arm area is open and was dressed with xeroform, non adherent and wrapped with gauze. The left wrist area has crusted over and we will leave open because it is intact.  Pt has open surgical wound from previous surgery which is pink and looks clean. Moist to dry dressing changes with gauze twice daily.

## 2020-11-21 NOTE — Progress Notes (Signed)
Pt admitted to 6N24 from ED via stretcher. Son at bedside who is his caregiver at home.  Pt has a left/mid lower colostomy and a dressing mid abd which I will change later. Pt wears glasses and has a productive cough. Pulse initially in the 110s.

## 2020-11-21 NOTE — Progress Notes (Addendum)
Family Medicine Teaching Service Daily Progress Note Intern Pager: [phone]  Patient name: [name] Medical record number: [id number] Date of birth: 1945/09/19 Age: 75 y.o. Gender: male  Primary Care Provider: Pcp, No Consultants: GI, Surgery Code Status: Full  Pt Overview and Major Events to Date:  Admit- 6/23  Assessment and Plan: [name] is a 75 y.o. male presenting with dyspnea 2/2 GI bleed. PMH is significant for anemia, A. fib, bacteremia, MV replacement, basal cell carcinoma, COPD, dementia, pre-diabetes, DVT, HTN, thrombocytopenia, colon cancer (colostomy).  Dyspnea, Symptomatic anemia 2/2 GI bleed s/p sigmoid colectomy and End colostomy Hgd 6.1 this AM.  Received additional 1U. GI procedure today.   - GI following, appreciate recs following procedure - Post-procedure CBC  Atrial Fibrillation Holding home medciations.  Plan on starting oral Diltiazem 35 mg and see how patint responds.  Patient adament about not restarting Eliquis. - Continue to hold home medications - Diltiazem 35 mg  Thrombocytopenia Appears to be chronic.  Likely due to GI bleed.  Concern for cancer process or possibly due to mitral valve replacement. - Daily CBC  FEN/GI: NPO PPx: SCD's  Dispo:Home in 2-3 days. Barriers include Not medically stable.   Subjective:  Patient indicates feeling better after transfusion.  Objective: Temp:  [97.6 F (36.4 C)-98.6 F (37 C)] 98 F (36.7 C) (06/23 0805) Pulse Rate:  [94-127] 125 (06/23 0945) Resp:  [14-28] 15 (06/23 0945) BP: (93-120)/(56-87) 109/72 (06/23 0945) SpO2:  [96 %-100 %] 100 % (06/23 0945) Weight:  [65.3 kg] 65.3 kg (06/22 1737) Physical Exam:  Physical Exam HENT:     Head: Normocephalic and atraumatic.     Mouth/Throat:     Mouth: Mucous membranes are moist.  Cardiovascular:     Rate and Rhythm: Tachycardia present.  Pulmonary:     Effort: Pulmonary effort is normal.     Breath sounds: Normal breath sounds.   Abdominal:     General: Abdomen is flat. There is no distension.     Palpations: Abdomen is soft.     Tenderness: There is no abdominal tenderness.  Skin:    General: Skin is warm.  Neurological:     Mental Status: He is alert.     Laboratory: Recent Labs  Lab 11/20/20 1406 11/21/20 0435  WBC 4.5 3.8*  HGB 4.5* 6.1*  HCT 15.0* 18.8*  PLT 69* 62*   Recent Labs  Lab 11/20/20 1406  NA 142  K 4.1  CL 110  CO2 25  BUN 48*  CREATININE 1.18  CALCIUM 8.1*  GLUCOSE 110*      Imaging/Diagnostic Tests: EXAM: PORTABLE CHEST 1 VIEW   COMPARISON:  10/24/2020   FINDINGS: Cardiomegaly status post median sternotomy. Both lungs are clear. The visualized skeletal structures are unremarkable.   IMPRESSION: Cardiomegaly without acute abnormality of the lungs in AP portable projection.     Electronically Signed   By: Eddie Candle M.D.   On: 11/20/2020 14:10  Delora Fuel, MD 11/21/2020, 10:00 AM PGY-1, [place] Intern pager: [phone], text pages welcome

## 2020-11-21 NOTE — Interval H&P Note (Signed)
History and Physical Interval Note:  11/21/2020 10:32 AM  [name]  has presented today for surgery, with the diagnosis of Melena, profound anemia.  The various methods of treatment have been discussed with the patient and family. After consideration of risks, benefits and other options for treatment, the patient has consented to  Procedure(s): ENTEROSCOPY (N/A) as a surgical intervention.  The patient's history has been reviewed, patient examined, no change in status, stable for surgery.  I have reviewed the patient's chart and labs.  Questions were answered to the patient's satisfaction.     Silvano Rusk

## 2020-11-21 NOTE — Transfer of Care (Signed)
Immediate Anesthesia Transfer of Care Note  Patient: [name]  Procedure(s) Performed: ENTEROSCOPY BIOPSY  Patient Location: Endoscopy Unit  Anesthesia Type:MAC  Level of Consciousness: drowsy  Airway & Oxygen Therapy: Patient Spontanous Breathing and Patient connected to nasal cannula oxygen  Post-op Assessment: Report given to RN and Post -op Vital signs reviewed and stable  Post vital signs: Reviewed and stable  Last Vitals:  Vitals Value Taken Time  BP 111/53 11/21/20 1128  Temp    Pulse    Resp 18 11/21/20 1129  SpO2    Vitals shown include unvalidated device data.  Last Pain:  Vitals:   11/21/20 1000  TempSrc: Temporal  PainSc: 0-No pain         Complications: No notable events documented.

## 2020-11-21 NOTE — Progress Notes (Signed)
PT Cancellation Note  Patient Details Name: [name] MRN: [id number] DOB: [birth year]   Cancelled Treatment:    Reason Eval/Treat Not Completed: Patient at procedure or test/unavailable (Pt in endoscopy.  Will check back as able.)   Alvira Philips 11/21/2020, 10:50 AM Colbi Schiltz M,PT Acute Rehab Services [phone] (pager)

## 2020-11-21 NOTE — Progress Notes (Signed)
Subjective: No new complaints this morning.  Doesn't know if he is still bleeding.  Room certainly smells as if he is.  He does state that he wants to flush his blood thinner medication down the toilet and never take it again.  ROS: See above, otherwise other systems negative  Objective: Vital signs in last 24 hours: Temp:  [97.6 F (36.4 C)-98.6 F (37 C)] 98 F (36.7 C) (06/23 0805) Pulse Rate:  [94-127] 127 (06/23 0805) Resp:  [14-28] 18 (06/23 0805) BP: (93-120)/(56-87) 120/69 (06/23 0805) SpO2:  [96 %-100 %] 98 % (06/23 0805) Weight:  [65.3 kg] 65.3 kg (06/22 1737)    Intake/Output from previous day: 06/22 0701 - 06/23 0700 In: 1487.8 [I.V.:49; Blood:438.8; IV Piggyback:1000] Out: -  Intake/Output this shift: Total I/O In: 315 [Blood:315] Out: -   PE: Heart: tachy in 120s Lungs: CTAB Abd: soft, NT, ND, colostomy pouch just changed.  Some soft melanic stool present at the stoma.  Lots of flatus, bag burped.  Midline wound is healing well and very small with 100% beefy granulation tissue.  Lab Results:  Recent Labs    11/20/20 1406 11/21/20 0435  WBC 4.5 3.8*  HGB 4.5* 6.1*  HCT 15.0* 18.8*  PLT 69* 62*   BMET Recent Labs    11/20/20 1406  NA 142  K 4.1  CL 110  CO2 25  GLUCOSE 110*  BUN 48*  CREATININE 1.18  CALCIUM 8.1*   PT/INR Recent Labs    11/20/20 1652  LABPROT 21.2*  INR 1.8*   CMP     Component Value Date/Time   NA 142 11/20/2020 1406   NA 139 01/29/2020 1548   K 4.1 11/20/2020 1406   CL 110 11/20/2020 1406   CO2 25 11/20/2020 1406   GLUCOSE 110 (H) 11/20/2020 1406   BUN 48 (H) 11/20/2020 1406   BUN 34 (H) 01/29/2020 1548   CREATININE 1.18 11/20/2020 1406   CALCIUM 8.1 (L) 11/20/2020 1406   PROT 6.0 (L) 10/23/2020 0048   PROT 7.3 01/29/2020 1548   ALBUMIN 2.3 (L) 10/23/2020 0048   ALBUMIN 3.9 01/29/2020 1548   AST 15 10/23/2020 0048   ALT 19 10/23/2020 0048   ALKPHOS 63 10/23/2020 0048   BILITOT 1.2 10/23/2020  0048   BILITOT 0.4 01/29/2020 1548   GFRNONAA >60 11/20/2020 1406   GFRAA 79 01/29/2020 1548   Lipase  No results found for: LIPASE     Studies/Results: DG Chest Port 1 View  Result Date: 11/20/2020 CLINICAL DATA:  Shortness of breath, hypotension EXAM: PORTABLE CHEST 1 VIEW COMPARISON:  10/24/2020 FINDINGS: Cardiomegaly status post median sternotomy. Both lungs are clear. The visualized skeletal structures are unremarkable. IMPRESSION: Cardiomegaly without acute abnormality of the lungs in AP portable projection. Electronically Signed   By: Eddie Candle M.D.   On: 11/20/2020 14:10    Anti-infectives: Anti-infectives (From admission, onward)    None        Assessment/Plan ABL anemia secondary to GI bleed of unclear etiology -plan today for EGD.  If this is negative, would recommend colonoscopy through stoma (it is far enough out to be safe to do so) as his last c-scope was really only to the level of his tumor and the remaining colon was unable to be evaluated. -if he continues to bleed despite holding anticoagulation, all attempts at nonsurgical treatments would need to be made as he is 4 weeks out from his surgery and not a candidate  to return to the OR at this point. 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

## 2020-11-21 NOTE — Progress Notes (Signed)
   11/21/20 1340  Assess: MEWS Score  Temp 98.1 F (36.7 C)  BP 110/76  Pulse Rate (!) 114  Resp 18  Level of Consciousness Alert  SpO2 100 %  O2 Device Room Air  Patient Activity (if Appropriate) In bed  Assess: if the MEWS score is Yellow or Red  Were vital signs taken at a resting state? Yes  Focused Assessment No change from prior assessment  Early Detection of Sepsis Score *See Row Information* Low  MEWS guidelines implemented *See Row Information* Yes  Treat  MEWS Interventions Other (Comment)  Take Vital Signs  Increase Vital Sign Frequency  Yellow: Q 2hr X 2 then Q 4hr X 2, if remains yellow, continue Q 4hrs  Escalate  MEWS: Escalate Yellow: discuss with charge nurse/RN and consider discussing with provider and RRT  Notify: Charge Nurse/RN  Name of Charge Nurse/RN Notified Daron Offer, RN  Date Charge Nurse/RN Notified 11/21/20  Time Charge Nurse/RN Notified 1340  Document  Progress note created (see row info) Yes

## 2020-11-21 NOTE — Progress Notes (Signed)
Placed a 2 3/4" 2 piece irrigation sleeve on stoma for moviprep.

## 2020-11-21 NOTE — Anesthesia Preprocedure Evaluation (Addendum)
Anesthesia Evaluation  Patient identified by MRN, date of birth, ID band Patient awake    Reviewed: Allergy & Precautions, NPO status , Patient's Chart, lab work & pertinent test results, reviewed documented beta blocker date and time   Airway Mallampati: II  TM Distance: >3 FB Neck ROM: Full   Comment: Thick beard Dental  (+) Dental Advisory Given, Missing, Chipped, Poor Dentition,    Pulmonary COPD,  COPD inhaler, Current Smoker and Patient abstained from smoking.,    Pulmonary exam normal breath sounds clear to auscultation       Cardiovascular hypertension, Pt. on medications and Pt. on home beta blockers +CHF and + DVT  + dysrhythmias (eliquis) Atrial Fibrillation + Valvular Problems/Murmurs (MVR 2018)  Rhythm:Regular Rate:Tachycardia  Echo 09/2020: 1. Left ventricular ejection fraction, by estimation, is 65 to 70%. The  left ventricle has normal function. The left ventricle has no regional  wall motion abnormalities. Left ventricular diastolic function could not  be evaluated.  2. Right ventricular systolic function is normal. The right ventricular  size is mildly enlarged. There is normal pulmonary artery systolic  pressure. The estimated right ventricular systolic pressure is 76.5 mmHg.  3. 31 mm mosaic prosthetic valve with MG 7 mmHG @ 130 bpm. EOA 2.32 cm2.  DI 1.6. Normal prosthesis with higher gradients due to tachycardia. The  mitral valve has been repaired/replaced. No evidence of mitral valve  regurgitation. There is a 31 mm  Medtronic Mosaic bioprosthetic valve present in the mitral position.  Procedure Date: 07/14/2016.  4. The aortic valve is tricuspid. Aortic valve regurgitation is not  visualized. No aortic stenosis is present.  5. The inferior vena cava is normal in size with greater than 50%  respiratory variability, suggesting right atrial pressure of 3 mmHg.    Neuro/Psych PSYCHIATRIC DISORDERS  Dementia negative neurological ROS     GI/Hepatic Neg liver ROS, GIB POD 28 S/P sigmoid colectomy with end colostomy 10/24/20    Endo/Other  diabetes  Renal/GU negative Renal ROS  negative genitourinary   Musculoskeletal negative musculoskeletal ROS (+)   Abdominal   Peds  Hematology  (+) Blood dyscrasia, anemia , Hb 4.5 yesterday, up to 6.1 this AM (baseline 8)  plt 62   Anesthesia Other Findings   Reproductive/Obstetrics negative OB ROS                            Anesthesia Physical Anesthesia Plan  ASA: 4  Anesthesia Plan: MAC   Post-op Pain Management:    Induction:   PONV Risk Score and Plan: 2 and Propofol infusion and TIVA  Airway Management Planned: Natural Airway and Simple Face Mask  Additional Equipment: None  Intra-op Plan:   Post-operative Plan:   Informed Consent: I have reviewed the patients History and Physical, chart, labs and discussed the procedure including the risks, benefits and alternatives for the proposed anesthesia with the patient or authorized representative who has indicated his/her understanding and acceptance.     Dental advisory given  Plan Discussed with: CRNA  Anesthesia Plan Comments: (Hb 6.8 from 6.1 this morning after 1 unit prbc, will get another unit started before proceeding w/ MAC, currently sinus tachycardic in the 120s)       Anesthesia Quick Evaluation

## 2020-11-21 NOTE — Anesthesia Procedure Notes (Signed)
Procedure Name: MAC Date/Time: 11/21/2020 10:48 AM Performed by: Fulton Reek, CRNA Pre-anesthesia Checklist: Patient identified, Emergency Drugs available, Suction available and Patient being monitored Patient Re-evaluated:Patient Re-evaluated prior to induction Oxygen Delivery Method: Nasal cannula Dental Injury: Teeth and Oropharynx as per pre-operative assessment

## 2020-11-21 NOTE — Progress Notes (Signed)
   11/21/20 1640  Assess: if the MEWS score is Yellow or Red  Focused Assessment No change from prior assessment  Early Detection of Sepsis Score *See Row Information* Low  MEWS guidelines implemented *See Row Information* Yes  Treat  MEWS Interventions Administered scheduled meds/treatments  Take Vital Signs  Increase Vital Sign Frequency  Yellow: Q 2hr X 2 then Q 4hr X 2, if remains yellow, continue Q 4hrs  Escalate  MEWS: Escalate Yellow: discuss with charge nurse/RN and consider discussing with provider and RRT  Notify: Charge Nurse/RN  Name of Charge Nurse/RN Notified Daron Offer, Rn  Date Charge Nurse/RN Notified 11/21/20  Time Charge Nurse/RN Notified 1641  Document  Patient Outcome Stabilized after interventions  Progress note created (see row info) Yes

## 2020-11-21 NOTE — ED Notes (Signed)
Pt taken to Endo- will have CBC drawn there. Will come back to ED after procedure if pt does not have room upstairs.

## 2020-11-21 NOTE — Op Note (Signed)
[facility] Patient Name: [name] Procedure Date : 11/21/2020 MRN: [id number] Attending MD: Gatha Mayer , MD Date of Birth: February 21, 1946 CSN: [id number] Age: 75 Admit Type: Emergency Department Procedure:                Small bowel enteroscopy Indications:              Melena Providers:                Gatha Mayer, MD, Nelia Shi, RN, Cherylynn Ridges, Technician, Dellie Catholic, CRNA Referring MD:              Medicines:                Propofol per Anesthesia, Monitored Anesthesia Care Complications:            No immediate complications. Estimated Blood Loss:     Estimated blood loss was minimal. Procedure:                Pre-Anesthesia Assessment:                           - Prior to the procedure, a History and Physical                            was performed, and patient medications and                            allergies were reviewed. The patient's tolerance of                            previous anesthesia was also reviewed. The risks                            and benefits of the procedure and the sedation                            options and risks were discussed with the patient.                            All questions were answered, and informed consent                            was obtained. Prior Anticoagulants: The patient                            last took Eliquis (apixaban) 1 day prior to the                            procedure. ASA Grade Assessment: IV - A patient                            with severe systemic disease that is a constant  threat to life. After reviewing the risks and                            benefits, the patient was deemed in satisfactory                            condition to undergo the procedure.                           After obtaining informed consent, the endoscope was                            passed under direct vision. Throughout the                             procedure, the patient's blood pressure, pulse, and                            oxygen saturations were monitored continuously. The                            [id number]-Q180 ([id number] Olympus enteroscope was                            introduced through the mouth and advanced to the                            proximal jejunum. The small bowel enteroscopy was                            accomplished without difficulty. The patient                            tolerated the procedure well. Scope In: Scope Out: Findings:      Localized inflammation characterized by erosions was found in the       prepyloric region of the stomach. Biopsies were taken with a cold       forceps for histology. Verification of patient identification for the       specimen was done. Estimated blood loss was minimal.      Exam otherwise normal including retroflexion Impression:               - Gastritis. Biopsied. 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                           I think it will be ok to do - colostomy is 1 month  out - checking with surgery to be sure and will                            schedule pending response - I did and got the ok                           Hold Eliquis still                           Son updated Procedure Code(s):        --- Professional ---                           [phone], Small intestinal endoscopy, enteroscopy                            beyond second portion of duodenum, not including                            ileum; with biopsy, single or multiple Diagnosis Code(s):        --- Professional ---                           K29.70, Gastritis, unspecified, without bleeding                           K92.1, Melena (includes Hematochezia) CPT copyright 2019 American Medical  Association. All rights reserved. The codes documented in this report are preliminary and upon coder review may  be revised to meet current compliance requirements. Gatha Mayer, MD 11/21/2020 11:40:34 AM This report has been signed electronically. Number of Addenda: 0

## 2020-11-22 ENCOUNTER — Telehealth: Payer: Medicare Other

## 2020-11-22 ENCOUNTER — Inpatient Hospital Stay (HOSPITAL_COMMUNITY): Payer: Medicare Other | Admitting: Anesthesiology

## 2020-11-22 ENCOUNTER — Encounter (HOSPITAL_COMMUNITY): Payer: Self-pay | Admitting: Family Medicine

## 2020-11-22 ENCOUNTER — Encounter (HOSPITAL_COMMUNITY)
Admission: EM | Disposition: A | Discharge status: Home Health | Payer: Self-pay | Source: Home / Self Care | Attending: Family Medicine

## 2020-11-22 DIAGNOSIS — K922 Gastrointestinal hemorrhage, unspecified: Secondary | ICD-10-CM

## 2020-11-22 DIAGNOSIS — K921 Melena: Secondary | ICD-10-CM

## 2020-11-22 DIAGNOSIS — D509 Iron deficiency anemia, unspecified: Secondary | ICD-10-CM

## 2020-11-22 DIAGNOSIS — C187 Malignant neoplasm of sigmoid colon: Secondary | ICD-10-CM

## 2020-11-22 HISTORY — PX: COLONOSCOPY WITH PROPOFOL: SHX5780

## 2020-11-22 HISTORY — PX: GIVENS CAPSULE STUDY: SHX5432

## 2020-11-22 LAB — TYPE AND SCREEN
ABO/RH(D): AB POS
Antibody Screen: NEGATIVE
Unit division: 0
Unit division: 0
Unit division: 0
Unit division: 0

## 2020-11-22 LAB — BASIC METABOLIC PANEL
Anion gap: 8 (ref 5–15)
BUN: 20 mg/dL (ref 8–23)
CO2: 22 mmol/L (ref 22–32)
Calcium: 7.8 mg/dL — ABNORMAL LOW (ref 8.9–10.3)
Chloride: 108 mmol/L (ref 98–111)
Creatinine, Ser: 0.8 mg/dL (ref 0.61–1.24)
GFR, Estimated: >60 mL/min (ref >60)
Glucose, Bld: 85 mg/dL (ref 70–99)
Potassium: 3.6 mmol/L (ref 3.5–5.1)
Sodium: 138 mmol/L (ref 135–145)

## 2020-11-22 LAB — BPAM RBC
Blood Product Expiration Date: 202207122359
Blood Product Expiration Date: 202207122359
Blood Product Expiration Date: 202207162359
Blood Product Expiration Date: 202207172359
ISSUE DATE / TIME: 202206221804
ISSUE DATE / TIME: 202206222122
ISSUE DATE / TIME: 202206230556
ISSUE DATE / TIME: 202206231028
Unit Type and Rh: 6200
Unit Type and Rh: 6200
Unit Type and Rh: 6200
Unit Type and Rh: 8400

## 2020-11-22 LAB — HEMOGLOBIN AND HEMATOCRIT, BLOOD
HCT: 23.9 % — ABNORMAL LOW (ref 39.0–52.0)
HCT: 22.9 % — ABNORMAL LOW (ref 39.0–52.0)
HCT: 24.6 % — ABNORMAL LOW (ref 39.0–52.0)
Hemoglobin: 8.1 g/dL — ABNORMAL LOW (ref 13.0–17.0)
Hemoglobin: 7.8 g/dL — ABNORMAL LOW (ref 13.0–17.0)
Hemoglobin: 8.3 g/dL — ABNORMAL LOW (ref 13.0–17.0)

## 2020-11-22 LAB — CBC
HCT: 22.2 % — ABNORMAL LOW (ref 39.0–52.0)
Hemoglobin: 7.7 g/dL — ABNORMAL LOW (ref 13.0–17.0)
MCH: 29.8 pg (ref 26.0–34.0)
MCHC: 34.7 g/dL (ref 30.0–36.0)
MCV: 86 fL (ref 80.0–100.0)
Platelets: 65 10*3/uL — ABNORMAL LOW (ref 150–400)
RBC: 2.58 MIL/uL — ABNORMAL LOW (ref 4.22–5.81)
RDW: 17.1 % — ABNORMAL HIGH (ref 11.5–15.5)
WBC: 3.4 10*3/uL — ABNORMAL LOW (ref 4.0–10.5)
nRBC: 0.9 % — ABNORMAL HIGH (ref 0.0–0.2)

## 2020-11-22 LAB — SURGICAL PATHOLOGY

## 2020-11-22 SURGERY — COLONOSCOPY WITH PROPOFOL
Anesthesia: Monitor Anesthesia Care

## 2020-11-22 SURGERY — IMAGING PROCEDURE, GI TRACT, INTRALUMINAL, VIA CAPSULE

## 2020-11-22 MED ORDER — PROPOFOL 10 MG/ML IV BOLUS
INTRAVENOUS | Status: DC | PRN
  Administered 2020-11-22 (×2): 20 mg via INTRAVENOUS
  Administered 2020-11-22: 30 mg via INTRAVENOUS
  Administered 2020-11-22: 20 mg via INTRAVENOUS
  Administered 2020-11-22: 30 mg via INTRAVENOUS

## 2020-11-22 MED ORDER — LACTATED RINGERS IV SOLN: INTRAVENOUS | Status: DC | PRN

## 2020-11-22 MED ORDER — PHENYLEPHRINE 40 MCG/ML (10ML) SYRINGE FOR IV PUSH (FOR BLOOD PRESSURE SUPPORT)
PREFILLED_SYRINGE | INTRAVENOUS | Status: DC | PRN
  Administered 2020-11-22: 80 ug via INTRAVENOUS

## 2020-11-22 MED ORDER — DILTIAZEM HCL ER COATED BEADS 120 MG PO CP24
120.0000 mg | ORAL_CAPSULE | Freq: Every day | ORAL | Status: DC
  Administered 2020-11-22 – 2020-11-26 (×5): 120 mg via ORAL
  Filled 2020-11-22 (×5): qty 1

## 2020-11-22 MED ORDER — DILTIAZEM HCL 90 MG PO TABS: 45.0000 mg | ORAL_TABLET | Freq: Four times a day (QID) | ORAL | Status: DC

## 2020-11-22 MED ORDER — DILTIAZEM HCL 90 MG PO TABS
45.0000 mg | ORAL_TABLET | Freq: Once | ORAL | Status: AC
  Administered 2020-11-22: 45 mg via ORAL
  Filled 2020-11-22: qty 0.5

## 2020-11-22 SURGICAL SUPPLY — 21 items

## 2020-11-22 NOTE — Evaluation (Addendum)
Occupational Therapy Evaluation Patient Details Name: [name] MRN: [id number] DOB: 12/28/1945 Today's Date: 11/22/2020    History of Present Illness 75 y.o. male transferred from echo lab to ER on 5/19 when noted to have HR in 140s, with rhythm of atrial flutter and asymptomatic. Chest Abdomen Pelvis CT shows large colonic mass in the sigmoid colon with pelvic lymphadenopathy and small pulmonary nodules indicative of metastatic disease, potential pneumonia of L lower lobe, and mild paraseptal emphysema. Pt is now s/p sigmoid colectomy with end colostomy and cystoscopy with bil ureteral stents on 10/25/20. 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   Clinical Impression   Pt presents with decline in function and safety with ADLs and ADL mobility with impaired balance and endurance. Pt initially with HR of 99-107 supine/sitting EOB, ambulating to bathroom for toilet transfers and ADLs. Pt's HR increased to 127 and pt returned to bed. PTA pt lived at home with his son and son's girlfriend and was Ind with ADLs/elfcare and mobility. Pt currently requires min guard A with LB ADLs, mobility using RW and 1 person HHA and selfcare tasks standing. Pt would benefit from acute OT services to address impairments to maximize level of function and safety. Transport coming to take to endoscopy at end of session    Follow Up Recommendations  Supervision - Intermittent;No OT follow up    Equipment Recommendations  None recommended by OT    Recommendations for Other Services  PT consult     Precautions / Restrictions Precautions Precautions: Fall Precaution Comments: ostomy, watch HR Restrictions Weight Bearing Restrictions: No      Mobility Bed Mobility Overal bed mobility: Modified Independent Bed Mobility: Supine to Sit;Sit to Supine     Supine to sit:  Supervision Sit to supine: Supervision   General bed mobility comments: HOB elevated    Transfers Overall transfer level: Needs assistance Equipment used: Rolling walker (2 wheeled);1 person hand held assist Transfers: Sit to/from Stand Sit to Stand: Min guard              Balance Overall balance assessment: Mild deficits observed, not formally tested Sitting-balance support: No upper extremity supported Sitting balance-Leahy Scale: Good     Standing balance support: Single extremity supported;During functional activity Standing balance-Leahy Scale: Fair                             ADL either performed or assessed with clinical judgement   ADL Overall ADL's : Needs assistance/impaired   Eating/Feeding Details (indicate cue type and reason): NPO at this time, presents with strength and AROM WFL for self feeding tasks Grooming: Wash/dry hands;Wash/dry face;Min guard;Standing   Upper Body Bathing: Min guard;Standing Upper Body Bathing Details (indicate cue type and reason): simulated standing at EOB Lower Body Bathing: Min guard;Sit to/from stand   Upper Body Dressing : Min guard;Standing   Lower Body Dressing: Min guard;Sit to/from stand   Toilet Transfer: Min guard;Ambulation;Comfort height toilet;Grab bars;RW   Toileting- Water quality scientist and Hygiene: Min guard;Sit to/from stand       Functional mobility during ADLs: Min guard;Cueing for safety;Rolling walker (1 person HHA assist from bathroom back to bed) General ADL Comments: 1 person HHA back to bed from bathroom     Vision Baseline Vision/History: Wears glasses Patient Visual Report: No change from baseline       Perception  Praxis      Pertinent Vitals/Pain Pain Assessment: No/denies pain Faces Pain Scale: No hurt Pain Intervention(s): Monitored during session;Repositioned     Hand Dominance Right   Extremity/Trunk Assessment Upper Extremity Assessment Upper Extremity  Assessment: Generalized weakness   Lower Extremity Assessment Lower Extremity Assessment: Defer to PT evaluation   Cervical / Trunk Assessment Cervical / Trunk Assessment: Normal   Communication Communication Communication: No difficulties   Cognition Arousal/Alertness: Awake/alert Behavior During Therapy: WFL for tasks assessed/performed Overall Cognitive Status: History of cognitive impairments - at baseline                                 General Comments: Follows commands but does have decreased safety awareness and memory   General Comments       Exercises     Shoulder Instructions      Home Living Family/patient expects to be discharged to:: Private residence Living Arrangements: Children Available Help at Discharge: Family;Available 24 hours/day Type of Home: Mobile home Home Access: Stairs to enter Entrance Stairs-Number of Steps: 3 Entrance Stairs-Rails: Can reach both Home Layout: One level     Bathroom Shower/Tub: Walk-in shower;Tub/shower unit   Bathroom Toilet: Standard     Home Equipment: Cane - single point          Prior Functioning/Environment Level of Independence: Independent        Comments: limited to household and very short community distances        OT Problem List: Decreased strength;Decreased activity tolerance;Decreased safety awareness;Decreased cognition;Decreased knowledge of use of DME or AE      OT Treatment/Interventions: Self-care/ADL training;Therapeutic exercise;Therapeutic activities;Patient/family education;Balance training    OT Goals(Current goals can be found in the care plan section) Acute Rehab OT Goals Patient Stated Goal: return home OT Goal Formulation: With patient Time For Goal Achievement: 12/06/20 Potential to Achieve Goals: Good ADL Goals Pt Will Perform Grooming: with supervision;with set-up;with modified independence;standing Pt Will Perform Upper Body Bathing: with supervision;with  set-up;with modified independence;standing Pt Will Perform Lower Body Bathing: with supervision;with set-up;with modified independence;sit to/from stand Pt Will Perform Upper Body Dressing: with supervision;with modified independence;with set-up;standing Pt Will Perform Lower Body Dressing: with supervision;with set-up;with modified independence;sit to/from stand Pt Will Transfer to Toilet: with supervision;with modified independence;ambulating Pt Will Perform Toileting - Clothing Manipulation and hygiene: with supervision;with modified independence;with adaptive equipment  OT Frequency: Min 2X/week   Barriers to D/C:            Co-evaluation              AM-PAC OT "6 Clicks" Daily Activity     Outcome Measure Help from another person eating meals?: None Help from another person taking care of personal grooming?: A Little Help from another person toileting, which includes using toliet, bedpan, or urinal?: A Little Help from another person bathing (including washing, rinsing, drying)?: A Little Help from another person to put on and taking off regular upper body clothing?: A Little Help from another person to put on and taking off regular lower body clothing?: A Little 6 Click Score: 19   End of Session Equipment Utilized During Treatment: Gait belt;Rolling walker  Activity Tolerance: Patient tolerated treatment well Patient left: in bed;with bed alarm set;with call bell/phone within reach;with nursing/sitter in room  OT Visit Diagnosis: Unsteadiness on feet (R26.81);Other abnormalities of gait and mobility (R26.89);Pain  Time: 3559-7416 OT Time Calculation (min): 25 min Charges:  OT General Charges $OT Visit: 1 Visit OT Evaluation $OT Eval Moderate Complexity: 1 Mod OT Treatments $Self Care/Home Management : 8-22 mins    Emmit Alexanders [facility] 11/22/2020, 11:45 AM

## 2020-11-22 NOTE — Progress Notes (Signed)
Family Medicine Teaching Service Daily Progress Note Intern Pager: [phone]  Patient name: [name] Medical record number: [id number] Date of birth: 1945/08/16 Age: 75 y.o. Gender: male  Primary Care Provider: Pcp, No Consultants: GI Code Status: Full  Pt Overview and Major Events to Date:  6/22- Admission 6/23- EGD  Assessment and Plan: PHI AVANS is a 75 y.o. male presenting with dyspnea 2/2 GI bleed. PMH is significant for anemia, A. fib, bacteremia, MV replacement, basal cell carcinoma, COPD, dementia, pre-diabetes, DVT, HTN, thrombocytopenia, colon cancer (colostomy).  Dyspnea, Symptomatic anemia 2/2 GI bleed s/p sigmoid colectomy and End colostomy Hgb fairly stable around 8.  Plan for Colonoscopy today.  Patient expressed understanding of need for bowel prep. - GI following, appreciate recs - PT/OT following procedure - Post-procedure CBC    Tachycardia  Atrial Fibrillation Telemetry shows mixture.  Has been 110's-120's. - Restart home diltiazem following procedure  Thrombocytopenia Stable in 60's.   - AM CBC   FEN/GI: NPO PPx: SCD's Dispo:Home in 2-3 days. Barriers include GI bleed.   Subjective:  Patient indicates feeling better.  Objective: Temp:  [97.6 F (36.4 C)-98.6 F (37 C)] 98.1 F (36.7 C) (06/24 0634) Pulse Rate:  [108-128] 128 (06/24 0634) Resp:  [13-21] 17 (06/24 0634) BP: (107-135)/(53-84) 120/81 (06/24 0634) SpO2:  [97 %-100 %] 98 % (06/24 0634) Weight:  [65.3 kg] 65.3 kg (06/23 1000) Physical Exam:  Physical Exam Constitutional:      Appearance: Normal appearance.  HENT:     Head: Normocephalic.     Mouth/Throat:     Mouth: Mucous membranes are moist.  Cardiovascular:     Rate and Rhythm: Regular rhythm. Tachycardia present.     Pulses: Normal pulses.  Pulmonary:     Effort: Pulmonary effort is normal.     Breath sounds: Normal breath sounds.  Abdominal:     General: Abdomen is flat.     Palpations: Abdomen is  soft.     Tenderness: There is no abdominal tenderness.  Skin:    General: Skin is warm.  Neurological:     Mental Status: He is alert.  Psychiatric:        Mood and Affect: Mood normal.        Behavior: Behavior normal.     Laboratory: Recent Labs  Lab 11/20/20 1406 11/21/20 0435 11/21/20 1012 11/21/20 1549 11/21/20 2117 11/22/20 0355  WBC 4.5 3.8*  --   --   --  3.4*  HGB 4.5* 6.1*   < > 8.0* 7.8* 7.7*  HCT 15.0* 18.8*   < > 24.1* 23.0* 22.2*  PLT 69* 62*  --   --   --  65*   < > = values in this interval not displayed.   Recent Labs  Lab 11/20/20 1406 11/21/20 1012 11/22/20 0355  NA 142 143 138  K 4.1 4.2 3.6  CL 110 109 108  CO2 25  --  22  BUN 48* 28* 20  CREATININE 1.18 0.90 0.80  CALCIUM 8.1*  --  7.8*  GLUCOSE 110* 91 85     Imaging/Diagnostic Tests: No new imaging  Delora Fuel, MD 11/22/2020, 8:07 AM PGY-1, [place] Intern pager: [phone], text pages welcome

## 2020-11-22 NOTE — Progress Notes (Signed)
Changed out the [facility] pouch for a regular 2 piece 2 3/4" opening bag and changed dressing at the mid abd site moist to dry with gauze.

## 2020-11-22 NOTE — Anesthesia Preprocedure Evaluation (Signed)
Anesthesia Evaluation  Patient identified by MRN, date of birth, ID band  Reviewed: Allergy & Precautions, NPO status , Patient's Chart, lab work & pertinent test results  Airway Mallampati: II  TM Distance: >3 FB Neck ROM: Full    Dental  (+) Poor Dentition,    Pulmonary Current Smoker,     + decreased breath sounds      Cardiovascular hypertension,  Rhythm:Regular Rate:Tachycardia     Neuro/Psych    GI/Hepatic   Endo/Other  diabetes  Renal/GU      Musculoskeletal   Abdominal   Peds  Hematology   Anesthesia Other Findings   Reproductive/Obstetrics                             Anesthesia Physical Anesthesia Plan  ASA: 3  Anesthesia Plan: MAC   Post-op Pain Management:    Induction: Intravenous  PONV Risk Score and Plan: Ondansetron and Propofol infusion  Airway Management Planned: Natural Airway and Simple Face Mask  Additional Equipment:   Intra-op Plan:   Post-operative Plan:   Informed Consent: I have reviewed the patients History and Physical, chart, labs and discussed the procedure including the risks, benefits and alternatives for the proposed anesthesia with the patient or authorized representative who has indicated his/her understanding and acceptance.     Dental advisory given  Plan Discussed with: CRNA and Anesthesiologist  Anesthesia Plan Comments:         Anesthesia Quick Evaluation

## 2020-11-22 NOTE — Progress Notes (Signed)
1 Day Post-Op  Subjective: No pain, nausea, emesis. Stool continues to look melanic. He is looking forward to eating  Objective: Vital signs in last 24 hours: Temp:  [97.6 F (36.4 C)-98.6 F (37 C)] 98.1 F (36.7 C) (06/24 0634) Pulse Rate:  [108-128] 128 (06/24 0634) Resp:  [13-21] 17 (06/24 0634) BP: (107-135)/(53-84) 120/81 (06/24 0634) SpO2:  [97 %-100 %] 98 % (06/24 0634) Weight:  [65.3 kg] 65.3 kg (06/23 1000) Last BM Date: 11/21/20  Intake/Output from previous day: 06/23 0701 - 06/24 0700 In: 3832.6 [I.V.:3202.6; Blood:630] Out: 921 [Urine:920; Stool:1] Intake/Output this shift: No intake/output data recorded.  PE: Heart: tachycardic in 120s Lungs: CTAB Abd: soft, NT, ND. Pouch colostomy with liquid melanic stool. Dressing over abdominal incision c/d/i  Lab Results:  Recent Labs    11/21/20 0435 11/21/20 1012 11/21/20 2117 11/22/20 0355  WBC 3.8*  --   --  3.4*  HGB 6.1*   < > 7.8* 7.7*  HCT 18.8*   < > 23.0* 22.2*  PLT 62*  --   --  65*   < > = values in this interval not displayed.    BMET Recent Labs    11/20/20 1406 11/21/20 1012 11/22/20 0355  NA 142 143 138  K 4.1 4.2 3.6  CL 110 109 108  CO2 25  --  22  GLUCOSE 110* 91 85  BUN 48* 28* 20  CREATININE 1.18 0.90 0.80  CALCIUM 8.1*  --  7.8*    PT/INR Recent Labs    11/20/20 1652  LABPROT 21.2*  INR 1.8*    CMP     Component Value Date/Time   NA 138 11/22/2020 0355   NA 139 01/29/2020 1548   K 3.6 11/22/2020 0355   CL 108 11/22/2020 0355   CO2 22 11/22/2020 0355   GLUCOSE 85 11/22/2020 0355   BUN 20 11/22/2020 0355   BUN 34 (H) 01/29/2020 1548   CREATININE 0.80 11/22/2020 0355   CALCIUM 7.8 (L) 11/22/2020 0355   PROT 6.0 (L) 10/23/2020 0048   PROT 7.3 01/29/2020 1548   ALBUMIN 2.3 (L) 10/23/2020 0048   ALBUMIN 3.9 01/29/2020 1548   AST 15 10/23/2020 0048   ALT 19 10/23/2020 0048   ALKPHOS 63 10/23/2020 0048   BILITOT 1.2 10/23/2020 0048   BILITOT 0.4 01/29/2020  1548   GFRNONAA >60 11/22/2020 0355   GFRAA 79 01/29/2020 1548   Lipase  No results found for: LIPASE     Studies/Results: DG Chest Port 1 View  Result Date: 11/20/2020 CLINICAL DATA:  Shortness of breath, hypotension EXAM: PORTABLE CHEST 1 VIEW COMPARISON:  10/24/2020 FINDINGS: Cardiomegaly status post median sternotomy. Both lungs are clear. The visualized skeletal structures are unremarkable. IMPRESSION: Cardiomegaly without acute abnormality of the lungs in AP portable projection. Electronically Signed   By: Eddie Candle M.D.   On: 11/20/2020 14:10    Anti-infectives: Anti-infectives (From admission, onward)    None        Assessment/Plan ABL anemia secondary to GI bleed of unclear etiology - EGD yesterday with gastritis - no source of melena identified - colonoscopy today- last c-scope was really only to the level of his tumor and the remaining colon was unable to be evaluated. -if he continues to bleed despite holding anticoagulation, all attempts at nonsurgical treatments would need to be made as he is 4 weeks out from his surgery and not a candidate to return to the OR at this point. 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

## 2020-11-22 NOTE — Progress Notes (Signed)
Replaced irrigation sleeve with [employer] pouch. Output 300cc of dark red liquid stool.Patient only took the first part of the Moviprep and 1 cup of the second mix. Refused to continue the prep.

## 2020-11-22 NOTE — Progress Notes (Signed)
Initial Nutrition Assessment  DOCUMENTATION CODES:   Not applicable; previous diagnosis of severe chronic malnutrition likely persists  INTERVENTION:   If unable to advance diet, may need to consider initiation of TPN given chronic severe malnutrition  Boost Plus po TID, each supplement provides 360 kcal and 14 grams of protein  Add MVI with Minerals   NUTRITION DIAGNOSIS:   Inadequate oral intake related to altered GI function, acute illness as evidenced by NPO status.   GOAL:   Patient will meet greater than or equal to 90% of their needs  MONITOR:   Diet advancement, PO intake, Supplement acceptance, Weight trends, Labs  REASON FOR ASSESSMENT:   Malnutrition Screening Tool    ASSESSMENT:   75 yo male admitted with symptomatic anemia secondary to GI bleed. PMH includes sig colectomy with end colostomy secondary to colon cancer; basal cell carcinoma, COPD, dementia, pre DM, HTN  6/23 EGD with gastritis  Pt not available on visit today; out of room for Colonoscopy  Pt is POD 29 s/p sig colectomy with end colostomy secondary to adenocarcinoma of colon  Pt evaluated by [facility] RN; pt has 2 cancerous areas on his left arm  Pt with previous diagnosis on severe chronic malnutrition on admission in May 2022.   Current wt 65 kg; possible wt loss since admit 1 month ago with weight of 68/7 kg in May  Labs: reviewed Meds: reviewed  Diet Order:   Diet Order             Diet NPO time specified  Diet effective now                   EDUCATION NEEDS:   Not appropriate for education at this time  Skin:  Skin Assessment: Skin Integrity Issues: Skin Integrity Issues:: Other (Comment) Other: basal cell carcinoma x 2 in LUE  Last BM:  6/24 via colostomy  Height:   Ht Readings from Last 1 Encounters:  11/21/20 6' (1.829 m)    Weight:   Wt Readings from Last 1 Encounters:  11/21/20 65.3 kg     BMI:  Body mass index is 19.52 kg/m.  Estimated  Nutritional Needs:   Kcal:  2000-2200 kcals  Protein:  110-115 g  Fluid:  >/= 2 L    Kerman Passey MS, RDN, LDN, CNSC Registered Dietitian III Clinical Nutrition RD Pager and On-Call Pager Number Located in [place]

## 2020-11-22 NOTE — Op Note (Signed)
[facility] Patient Name: [name] Procedure Date : 11/22/2020 MRN: [id number] Attending MD: Gatha Mayer , MD Date of Birth: 08-Sep-1945 CSN: [id number] Age: 75 Admit Type: Inpatient Procedure:                Colonoscopy Indications:              Melena Providers:                Gatha Mayer, MD, Janee Morn, Technician Referring MD:              Medicines:                Propofol per Anesthesia, Monitored Anesthesia Care Complications:            No immediate complications. Estimated Blood Loss:     Estimated blood loss: none. Procedure:                Pre-Anesthesia Assessment:                           - Prior to the procedure, a History and Physical                            was performed, and patient medications and                            allergies were reviewed. The patient's tolerance of                            previous anesthesia was also reviewed. The risks                            and benefits of the procedure and the sedation                            options and risks were discussed with the patient.                            All questions were answered, and informed consent                            was obtained. Prior Anticoagulants: The patient                            last took Eliquis (apixaban) 2 days prior to the                            procedure. ASA Grade Assessment: III - A patient                            with severe systemic disease. After reviewing the                            risks and benefits, the patient was deemed in  satisfactory condition to undergo the procedure.                           After obtaining informed consent, the colonoscope                            was passed under direct vision. Throughout the                            procedure, the patient's blood pressure, pulse, and                            oxygen saturations were monitored continuously. The                             PCF-H190DL ([id number]) Olympus pediatric colonoscope                            was introduced through the sigmoid colostomy and                            advanced to the the terminal ileum, with                            identification of the appendiceal orifice and IC                            valve. The colonoscopy was performed without                            difficulty. The patient tolerated the procedure                            well. The quality of the bowel preparation was                            fair. The terminal ileum, the ileocecal valve and                            the appendiceal orifice were photographed. Scope In: 12:47:11 PM Scope Out: 1:03:02 PM Total Procedure Duration: 0 hours 15 minutes 51 seconds  Findings:      Melena seen at ostomy - transition to brown stool more proximal.       Irrigated and flushed - no bleeding. Terminal ileum normal. ? occult       diverticula (?I could not see due to fair prep and spasm). At any rate       no bleeding source seen. Impression:               - Preparation of the colon was fair.                           - No specimens collected.                           -  The exam was suboptimal due to patient                            preparation. Recommendation:           - Return patient to hospital ward for ongoing care.                           - Will do capsule endoscopy small bowel - exam here                            limited but limited melena (at ostomy) so ? small                            bowel bleed (do not think what I saw at enteroscopy                            in stomach is cause but ?)                           Hold Eliquis for now                           Consider a CT-E also as he had + margins at                            resection of colon cancer Procedure Code(s):        --- Professional ---                           [phone], Colonoscopy through stoma; diagnostic,                             including collection of specimen(s) by brushing or                            washing, when performed (separate procedure) Diagnosis Code(s):        --- Professional ---                           K92.1, Melena (includes Hematochezia) CPT copyright 2019 American Medical Association. All rights reserved. The codes documented in this report are preliminary and upon coder review may  be revised to meet current compliance requirements. Gatha Mayer, MD 11/22/2020 1:24:19 PM This report has been signed electronically. Number of Addenda: 0

## 2020-11-22 NOTE — Plan of Care (Signed)

## 2020-11-22 NOTE — Consult Note (Signed)
[place] Nurse wound consult note Consultation was completed by review of records, images and assistance from the bedside nurse/clinical staff.  Patient was cared for 11/21/20 by excellent WTA with great assessment and treatment recommendation skills.   Reason for Consult:arm wounds post surgical  Wound type: Pt has 2 cancerous areas on his left arm, the lower arm area is open and was dressed with xeroform, non adherent and wrapped with gauze. The left wrist area has crusted over and we will leave open because it is intact. Pt has open surgical wound from previous surgery which is pink and looks clean. Moist to dry dressing changes with gauze twice daily.           Pressure Injury POA:NA Measurement: see nursing flow sheets Wound bed: see nursing flow sheet and descriptions above  Drainage (amount, consistency, odor)  Periwound: intact  Dressing procedure/placement/frequency:  Continue wound care orders per skin care order set implemented by [facility] yesterday will update orders.  Left arm; cover open wounds with single layer of xeroform; change daily Saline moist to moist gauze dressing BID to the surgical wound, cover with dry dressing, secure with conform or kerlix. Change BID    Re consult if needed, will not follow at this time. Thanks  Shaddai Shapley [employer], RN,CWOCN, CNS, [place] [phone])

## 2020-11-22 NOTE — Progress Notes (Signed)
PT Cancellation Note  Patient Details Name: [name] MRN: [id number] DOB: 07/12/1945   Cancelled Treatment:    Reason Eval/Treat Not Completed: Patient at procedure or test/unavailable.  Having endoscopic procedure and will retry as time and pt allow.   Ramond Dial 11/22/2020, 12:24 PM  Mee Hives, PT MS Acute Rehab Dept. Number: [place] and [place]

## 2020-11-22 NOTE — Anesthesia Postprocedure Evaluation (Signed)
Anesthesia Post Note  Patient: [name]  Procedure(s) Performed: COLONOSCOPY WITH PROPOFOL     Patient location during evaluation: Endoscopy Anesthesia Type: MAC Level of consciousness: awake and alert Pain management: pain level controlled Vital Signs Assessment: post-procedure vital signs reviewed and stable Respiratory status: spontaneous breathing, nonlabored ventilation, respiratory function stable and patient connected to nasal cannula oxygen Cardiovascular status: stable and blood pressure returned to baseline Postop Assessment: no apparent nausea or vomiting Anesthetic complications: no   No notable events documented.  Last Vitals:  Vitals:   11/22/20 0958 11/22/20 1122  BP: 118/80 116/76  Pulse: (!) 124 (!) 128  Resp: 18 20  Temp: 36.5 C 36.7 C  SpO2: 100% 99%    Last Pain:  Vitals:   11/22/20 1122  TempSrc: Oral  PainSc: 0-No pain                 Taz Vanness COKER

## 2020-11-22 NOTE — Transfer of Care (Signed)
Immediate Anesthesia Transfer of Care Note  Patient: [name]  Procedure(s) Performed: COLONOSCOPY WITH PROPOFOL  Patient Location: PACU  Anesthesia Type:MAC  Level of Consciousness: awake and patient cooperative  Airway & Oxygen Therapy: Patient Spontanous Breathing and Patient connected to nasal cannula oxygen  Post-op Assessment: Report given to RN and Post -op Vital signs reviewed and stable  Post vital signs: Reviewed and stable  Last Vitals:  Vitals Value Taken Time  BP    Temp    Pulse    Resp    SpO2      Last Pain:  Vitals:   11/22/20 1122  TempSrc: Oral  PainSc: 0-No pain         Complications: No notable events documented.

## 2020-11-22 NOTE — Anesthesia Procedure Notes (Signed)
Procedure Name: MAC Date/Time: 11/22/2020 12:40 PM Performed by: Renato Shin, CRNA Pre-anesthesia Checklist: Patient identified, Emergency Drugs available, Suction available and Patient being monitored Patient Re-evaluated:Patient Re-evaluated prior to induction Oxygen Delivery Method: Nasal cannula

## 2020-11-22 NOTE — Progress Notes (Addendum)
HEMATOLOGY-ONCOLOGY PROGRESS NOTE  SUBJECTIVE: [name] is followed by our office for colon cancer.  He was last seen on 11/15/2020.  He has stage II colon cancer although the mesenteric margin is in question and pelvic lymph nodes and lung nodules were noted on initial CT scans.  We had plan for restaging CT scans and then possible initiation of Xeloda depending on results.  He is now admitted for anemia secondary to GI bleed which is exacerbated.  The patient has chronic thrombocytopenia.  He underwent upper endoscopy 6/23 which showed gastritis.  Colonoscopy performed today did not show definitive source of bleeding.  Capsule endoscopy has been ordered.  The patient continues to have liquid brown stools.  He offers no other specific complaints today.  His son is at the bedside and asks when he can be discharged home.  He also asked if he will continue to need the "bag" on his stomach.  He is asking when this can be taken away.  PHYSICAL EXAMINATION:  Vitals:   11/22/20 0237 11/22/20 0634  BP: 111/77 120/81  Pulse: (!) 126 (!) 128  Resp: 17 17  Temp: 98 F (36.7 C) 98.1 F (36.7 C)  SpO2: 98% 98%   Filed Weights   11/20/20 1737 11/21/20 1000  Weight: 65.3 kg 65.3 kg    Intake/Output from previous day: 06/23 0701 - 06/24 0700 In: 3832.6 [I.V.:3202.6; Blood:630] Out: 921 [Urine:920; Stool:1]  GENERAL: Chronically ill-appearing male, no distress SKIN: Ulcerated lesion left hand with irregular border LUNGS: clear to auscultation and percussion with normal breathing effort HEART: irregular ABDOMEN: Positive bowel sounds, soft, left ostomy with liquid dark brown brown stool, gauze dressing at the midline wound NEURO: alert & oriented   LABORATORY DATA:  I have reviewed the data as listed CMP Latest Ref Rng & Units 11/22/2020 11/21/2020 11/20/2020  Glucose 70 - 99 mg/dL 85 91 110(H)  BUN 8 - 23 mg/dL 20 28(H) 48(H)  Creatinine 0.61 - 1.24 mg/dL 0.80 0.90 1.18  Sodium 135 - 145  mmol/L 138 143 142  Potassium 3.5 - 5.1 mmol/L 3.6 4.2 4.1  Chloride 98 - 111 mmol/L 108 109 110  CO2 22 - 32 mmol/L 22 - 25  Calcium 8.9 - 10.3 mg/dL 7.8(L) - 8.1(L)  Total Protein 6.5 - 8.1 g/dL - - -  Total Bilirubin 0.3 - 1.2 mg/dL - - -  Alkaline Phos 38 - 126 U/L - - -  AST 15 - 41 U/L - - -  ALT 0 - 44 U/L - - -    Lab Results  Component Value Date   WBC 3.4 (L) 11/22/2020   HGB 7.7 (L) 11/22/2020   HCT 22.2 (L) 11/22/2020   MCV 86.0 11/22/2020   PLT 65 (L) 11/22/2020   NEUTROABS 3.2 11/20/2020    DG Retrograde Pyelogram  Result Date: 10/24/2020 CLINICAL DATA:  75 year old male with colorectal cancer . EXAM: RETROGRADE PYELOGRAM COMPARISON:  CT chest, abdomen and pelvis 10/22/2020 FINDINGS: A total of 3 intraoperative saved images obtained during bilateral double-J ureteral stent placement are submitted for review. The images demonstrate a catheter in the bladder with contrast opacification of the bladder. Partial bilateral nephroureterograms. A ureteral stent is partially visualized on the left. No focal abnormality of the visualized portions of either ureter. IMPRESSION: Ureteropyelogram with placement of bilateral ureteral stents. Electronically Signed   By: Jacqulynn Cadet M.D.   On: 10/24/2020 11:14   DG Chest Port 1 View  Result Date: 11/20/2020 CLINICAL DATA:  Shortness of breath, hypotension EXAM: PORTABLE CHEST 1 VIEW COMPARISON:  10/24/2020 FINDINGS: Cardiomegaly status post median sternotomy. Both lungs are clear. The visualized skeletal structures are unremarkable. IMPRESSION: Cardiomegaly without acute abnormality of the lungs in AP portable projection. Electronically Signed   By: Eddie Candle M.D.   On: 11/20/2020 14:10   DG CHEST PORT 1 VIEW  Result Date: 10/24/2020 CLINICAL DATA:  Postop central line placement EXAM: PORTABLE CHEST 1 VIEW COMPARISON:  10/17/2020 FINDINGS: Right jugular catheter is been placed with the tip in the mid SVC. No pneumothorax Median  sternotomy. Negative for heart failure. Underlying chronic lung disease. No acute infiltrate or effusion. IMPRESSION: Central line placement into the SVC.  No pneumothorax. Electronically Signed   By: Franchot Gallo M.D.   On: 10/24/2020 13:45   DG Abd Portable 1V  Result Date: 10/29/2020 CLINICAL DATA:  Abdominal pain and distension following abdominal surgery EXAM: PORTABLE ABDOMEN - 1 VIEW COMPARISON:  Portable exam 1030 hours compared to CT abdomen and pelvis 10/22/2020 FINDINGS: Ostomy RIGHT lower quadrant. Surgical drain in pelvis. Numerous loops of air-filled mildly distended small bowel. Increased stool in RIGHT colon. Findings most likely represent postoperative ileus. No acute osseous findings or urinary tract calcification. IMPRESSION: Probable postoperative ileus. Electronically Signed   By: Lavonia Dana M.D.   On: 10/29/2020 10:43    ASSESSMENT AND PLAN: 1.  Sigmoid colon adenocarcinoma -Colonoscopy 10/21/2020- partially obstructing mass found in the sigmoid colon consistent with adenocarcinoma. -CEA on 10/22/2020 was 3.9 -CTs 10/22/2020-colonic mass with pelvic lymphadenopathy including right common iliac and external iliac nodes, small pulmonary nodules -10/24/2020-sigmoid colectomy, end colostomy, tumor stuck to pelvic sidewall with rind of tissue remaining at completion of surgery -Pathology- moderately differentiated adenocarcinoma the sigmoid colon, tumor extends into pericolonic connective tissue, no lymphovascular perineural invasion, 0/14 lymph nodes, carcinoma involves an area with the specimen was disrupted at mesenteric margin 2.  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 3.  Thrombocytopenia 4.  Atrial flutter 5.  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  COPD 7.  Hypertension 8.  Diabetes mellitus 9.  Dementia 10.  Tobacco dependence 11. Left wrist basal cell carcinoma -Skin biopsy 08/16/2020 - Basal cell carcinoma,  nodular and infiltrative patterns, peripheral and deep margins involved -Seen by radiation oncology 09/03/2020, case was discussed with Dr. Martin Majestic who is going to check with some Mohs specialist to see if disease is resectable, they were holding on radiation appointments until this could be explored. -Established care with the wound center on 09/13/2019 12.  Hospital admission 11/20/2020-GI bleed  [name] has been admitted for GI bleed.  He has received 4 units PRBC so far.  His Eliquis has been discontinued.  GI bleed likely due to DOAC use in the setting of recent surgery.  Agree with stopping anticoagulation at this point in time.  GI work-up to date has been unrevealing.  Capsule endoscopy results pending. His hemoglobin remains stable at this time.  The patient has stage II colon cancer with mesenteric margin and question of pelvic lymph nodes and lung nodules noted on initial CT scans.  We had plan for restaging CT scan of the chest, abdomen, pelvis and then follow-up in our office.  If possible, please obtain the scans while in house and we will discuss the results during follow-up.  He will need a CT of the chest/abdomen/pelvis with contrast.  Recommendations: 1.  Follow-up on results of capsule endoscopy. 2.  Continue to hold  Eliquis. 3.  Transfuse as needed for symptomatic anemia. 4.  Monitor platelets intermittently-overall stable at this time. 5.  Obtain CT chest/abdomen/pelvis with contrast for restaging prior to discharge.  He will keep his outpatient follow-up appointment as scheduled with [place] on 7/8.  Please call oncology over the weekend for questions.  We will see him on Monday if he remains in the hospital.   LOS: 1 day   Mikey Bussing, DNP, AGPCNP-BC, AOCNP 11/22/20 [name] was interviewed and examined.  He was admitted with severe anemia secondary to GI bleeding while on anticoagulation therapy.  The hemoglobin has stabilized following transfusion and discontinuation of  anticoagulation.  He has a history of stage II colon cancer.  We are concerned he may have metastatic disease based on the preoperative imaging.  He was scheduled for restaging CTs this week.  The CTs can be obtained while he is in the hospital.  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  I will check on him 11/25/2020 if he remains in the hospital.  I was present for greater than 50% of today's visit.  I performed medical decision making.

## 2020-11-23 DIAGNOSIS — K5521 Angiodysplasia of colon with hemorrhage: Secondary | ICD-10-CM

## 2020-11-23 LAB — BASIC METABOLIC PANEL
Anion gap: 9 (ref 5–15)
BUN: 9 mg/dL (ref 8–23)
CO2: 22 mmol/L (ref 22–32)
Calcium: 8.1 mg/dL — ABNORMAL LOW (ref 8.9–10.3)
Chloride: 104 mmol/L (ref 98–111)
Creatinine, Ser: 0.9 mg/dL (ref 0.61–1.24)
GFR, Estimated: >60 mL/min (ref >60)
Glucose, Bld: 76 mg/dL (ref 70–99)
Potassium: 3.5 mmol/L (ref 3.5–5.1)
Sodium: 135 mmol/L (ref 135–145)

## 2020-11-23 LAB — HEMOGLOBIN AND HEMATOCRIT, BLOOD
HCT: 23.2 % — ABNORMAL LOW (ref 39.0–52.0)
HCT: 25.4 % — ABNORMAL LOW (ref 39.0–52.0)
HCT: 23.7 % — ABNORMAL LOW (ref 39.0–52.0)
Hemoglobin: 7.8 g/dL — ABNORMAL LOW (ref 13.0–17.0)
Hemoglobin: 8.3 g/dL — ABNORMAL LOW (ref 13.0–17.0)
Hemoglobin: 8.1 g/dL — ABNORMAL LOW (ref 13.0–17.0)

## 2020-11-23 LAB — CBC
HCT: 23.9 % — ABNORMAL LOW (ref 39.0–52.0)
Hemoglobin: 7.9 g/dL — ABNORMAL LOW (ref 13.0–17.0)
MCH: 28.9 pg (ref 26.0–34.0)
MCHC: 33.1 g/dL (ref 30.0–36.0)
MCV: 87.5 fL (ref 80.0–100.0)
Platelets: 85 10*3/uL — ABNORMAL LOW (ref 150–400)
RBC: 2.73 MIL/uL — ABNORMAL LOW (ref 4.22–5.81)
RDW: 16.3 % — ABNORMAL HIGH (ref 11.5–15.5)
WBC: 3 10*3/uL — ABNORMAL LOW (ref 4.0–10.5)
nRBC: 0 % (ref 0.0–0.2)

## 2020-11-23 MED ORDER — METOPROLOL TARTRATE 50 MG PO TABS: 50.0000 mg | ORAL_TABLET | Freq: Two times a day (BID) | ORAL | Status: DC

## 2020-11-23 MED ORDER — METOPROLOL TARTRATE 50 MG PO TABS
50.0000 mg | ORAL_TABLET | Freq: Two times a day (BID) | ORAL | Status: DC
  Administered 2020-11-23: 50 mg via ORAL
  Filled 2020-11-23: qty 1

## 2020-11-23 MED ORDER — METOPROLOL TARTRATE 25 MG PO TABS
25.0000 mg | ORAL_TABLET | Freq: Once | ORAL | Status: AC
  Administered 2020-11-23: 25 mg via ORAL
  Filled 2020-11-23: qty 1

## 2020-11-23 MED ORDER — PANTOPRAZOLE SODIUM 40 MG PO TBEC
40.0000 mg | DELAYED_RELEASE_TABLET | Freq: Every day | ORAL | Status: DC
  Administered 2020-11-24 – 2020-11-27 (×2): 40 mg via ORAL
  Filled 2020-11-23 (×3): qty 1

## 2020-11-23 MED ORDER — LACTATED RINGERS IV SOLN: INTRAVENOUS | Status: DC

## 2020-11-23 MED ORDER — METOPROLOL TARTRATE 25 MG PO TABS
25.0000 mg | ORAL_TABLET | Freq: Two times a day (BID) | ORAL | Status: DC
  Administered 2020-11-23: 25 mg via ORAL
  Filled 2020-11-23: qty 1

## 2020-11-23 NOTE — Evaluation (Signed)
Physical Therapy Evaluation Patient Details Name: [name] MRN: [id number] DOB: 1946/05/10 Today's Date: 11/23/2020   History of Present Illness  75 y/o male presented to the ED on 6/22 for dyspnea and symptomatic anemia 2/2 GI bleed. 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 Patient underwent enteroscopy with finding of gastritis. Colonscopy performed but did not show definitive source of bleeding. PMH: bacterial endocarditis status post mitral valve replacement 07/2016, DVT, DM, hypertension, dementia, tobacco use, COPD, atrial fibrillation, sarcoidosis, and basal cell carcinoma, colon cancer  Clinical Impression  PTA, patient lives with son and reports independence with mobility. Patient currently functioning at supervision level for ambulation with no AD. Patient with mild balance deficits and decreased safety awareness with mobility. Patient presents with generalized weakness and decreased activity tolerance. Patient will benefit from skilled PT services during acute stay to address listed deficits. Per discussion with patient, patient would like HHPT to assist with improving strength, endurance, and balance.     Follow Up Recommendations Home health PT    Equipment Recommendations  None recommended by PT    Recommendations for Other Services       Precautions / Restrictions Precautions Precautions: Fall Precaution Comments: ostomy, watch HR Restrictions Weight Bearing Restrictions: No      Mobility  Bed Mobility Overal bed mobility: Modified Independent                  Transfers Overall transfer level: Modified independent               General transfer comment: increased time  Ambulation/Gait Ambulation/Gait assistance: Supervision Gait Distance (Feet): 30 Feet Assistive device: None Gait Pattern/deviations: Step-through pattern     General Gait Details: no LOB noted. supervision  for safety  Stairs            Wheelchair Mobility    Modified Rankin (Stroke Patients Only)       Balance Overall balance assessment: Mild deficits observed, not formally tested                                           Pertinent Vitals/Pain Pain Assessment: No/denies pain    Home Living Family/patient expects to be discharged to:: Private residence Living Arrangements: Children Available Help at Discharge: Family;Available 24 hours/day Type of Home: Mobile home Home Access: Stairs to enter Entrance Stairs-Rails: Right;Can reach Software engineer of Steps: 3 Home Layout: One level Home Equipment: Cane - single point      Prior Function Level of Independence: Independent         Comments: limited to household and very short community distances     Journalist, newspaper        Extremity/Trunk Assessment   Upper Extremity Assessment Upper Extremity Assessment: Defer to OT evaluation    Lower Extremity Assessment Lower Extremity Assessment: Generalized weakness    Cervical / Trunk Assessment Cervical / Trunk Assessment: Normal  Communication   Communication: No difficulties  Cognition Arousal/Alertness: Awake/alert Behavior During Therapy: WFL for tasks assessed/performed Overall Cognitive Status: History of cognitive impairments - at baseline                                 General Comments: Follows commands but does have decreased safety awareness and memory  General Comments      Exercises     Assessment/Plan    PT Assessment Patient needs continued PT services  PT Problem List Decreased activity tolerance       PT Treatment Interventions Gait training;Stair training;Balance training;Patient/family education;Therapeutic activities;Therapeutic exercise    PT Goals (Current goals can be found in the Care Plan section)  Acute Rehab PT Goals Patient Stated Goal: return home PT Goal  Formulation: With patient Time For Goal Achievement: 12/07/20 Potential to Achieve Goals: Good    Frequency Min 3X/week   Barriers to discharge        Co-evaluation               AM-PAC PT "6 Clicks" Mobility  Outcome Measure Help needed turning from your back to your side while in a flat bed without using bedrails?: None Help needed moving from lying on your back to sitting on the side of a flat bed without using bedrails?: None Help needed moving to and from a bed to a chair (including a wheelchair)?: None Help needed standing up from a chair using your arms (e.g., wheelchair or bedside chair)?: None Help needed to walk in hospital room?: A Little Help needed climbing 3-5 steps with a railing? : A Little 6 Click Score: 22    End of Session Equipment Utilized During Treatment: Gait belt Activity Tolerance: Patient tolerated treatment well Patient left: in bed;with call bell/phone within reach;with bed alarm set Nurse Communication: Mobility status PT Visit Diagnosis: Other abnormalities of gait and mobility (R26.89)    Time: 2671-2458 PT Time Calculation (min) (ACUTE ONLY): 24 min   Charges:   PT Evaluation $PT Eval Low Complexity: 1 Low PT Treatments $Therapeutic Activity: 8-22 mins        Karlie Aung A. Gilford Rile PT, DPT Acute Rehabilitation Services Pager [phone] Office [phone]   Linna Hoff 11/23/2020, 5:02 PM

## 2020-11-23 NOTE — Procedures (Addendum)
Indications: 75 yo male with recent sigmoid resection for partially obstructing sigmoid colon mass 09/2020 presenting with hgb 4.6 , on Eliquis. Enteroscopy  for mild gastritis. Colonoscopy with fair prep, no source identified .  Findings:   1) Complete Capsule endoscopy with adequate prep  2) one likely but not confirmed AVM at 2 hrs 5 min mark with active oozing. 3) one l AVM at 3 hrs 50 minutes, nonbleeding 4) gastritis as seen on push enteroscopy  Assessment and Plan: Complete Capsule endoscopy  with finding of two AVM's /angioectasia (we think may be only 1)  The most proximal AVM suspected appears to be bleeding. This is located about 1 hr 25 minutes beyond the first duodenal image.  The second AVM is at 3 hrs 50 minutes . These are out of reach of conventional enteroscopy   Pt will need single Balloon enteroscopy for APC of these AVM's . This is only done by one provider - we will see if he can be done early next week.  Leave in hospital on clear liquids for now. Off anticoagulation.

## 2020-11-23 NOTE — Progress Notes (Addendum)
Patient ID: [name], male   DOB: Jun 15, 1945, 75 y.o.   MRN: [id number]    Progress Note   Subjective  Day # 3  CC: Melena and anemia in setting of Eliquis, status post very recent sigmoid colectomy for adenocarcinoma of the colon partially obstructing stage II  Enteroscopy 11/21/2020-mild prepyloric erosions, gastritis Biopsies pending  Colonoscopy via ostomy 11/22/2020-prep fair, no bleeding source identified Capsule endoscopy 11/22/2020- probable AVM with active oozing in mid small bowel, and one other small AVM more distally - both out of reach of enteroscope- will need single Balloon enteroscopy  Hemoglobin this a.m. 8.3, stable status post 4 units packed RBCs  Patient was seen by oncology yesterday, requesting restaging CTs while he is in-house  Pt has no specific complaints today except wants to eat    Objective   Vital signs in last 24 hours: Temp:  [97.2 F (36.2 C)-98.9 F (37.2 C)] 98.9 F (37.2 C) (06/25 0916) Pulse Rate:  [120-128] 124 (06/25 0916) Resp:  [16-24] 19 (06/25 0916) BP: (108-124)/(62-88) 108/71 (06/25 0916) SpO2:  [96 %-100 %] 100 % (06/25 0916) Last BM Date: 11/22/20 General:    Elderly white male in NAD Heart:  Regular rate and rhythm; no murmurs Lungs: Respirations even and unlabored, lungs CTA bilaterally Abdomen:  Soft, nontender and nondistended. Normal bowel sounds., ostomy LLQ Extremities:  Without edema. Neurologic:  Alert and oriented,  grossly normal neurologically. Psych:  Cooperative. Normal mood and affect.  Lab Results: Recent Labs    11/20/20 1406 11/21/20 0435 11/21/20 1012 11/22/20 0355 11/22/20 1012 11/22/20 2155 11/23/20 0349 11/23/20 0926  WBC 4.5 3.8*  --  3.4*  --   --   --   --   HGB 4.5* 6.1*   < > 7.7*   < > 8.3* 7.8* 8.3*  HCT 15.0* 18.8*   < > 22.2*   < > 24.6* 23.2* 25.4*  PLT 69* 62*  --  65*  --   --   --   --    < > = values in this interval not displayed.   BMET Recent Labs    11/20/20 1406  11/21/20 1012 11/22/20 0355 11/23/20 0349  NA 142 143 138 135  K 4.1 4.2 3.6 3.5  CL 110 109 108 104  CO2 25  --  22 22  GLUCOSE 110* 91 85 76  BUN 48* 28* 20 9  CREATININE 1.18 0.90 0.80 0.90  CALCIUM 8.1*  --  7.8* 8.1*    PT/INR Recent Labs    11/20/20 1652  LABPROT 21.2*  INR 1.8*       Assessment / Plan:    #1 75 yo male with weakness, anemia and HGB 4.6 on admit , on Eliquis. Very recent sigmoid resection and end ileostomy for sigmoid adenocarcinoma stage II.  Workup with EGD and Colonoscopy unrevealing  Capsule endoscopy shows active oozing from a probable AVM in mid to distal small bowel , and one other nonbleeding small AVM  Pt will need Single Balloon enteroscopy for diagnosis and treatment /APC    HGB stable after 4 units  Holding Eliquis  #2 Afib #3 COPD  #4 Dementia  #5 thriombocytopenia  Plan; Advance to regular diet for now  Proceed with staging CT's as per oncology Continue to trend HGB Will work on arranging single Balloon enteroscopy per Dr Bryan Lemma- hopefully early this coming week      LOS: 2 days   Amy Esterwood PA-C 11/23/2020, 10:50 AM  Agree with Ms. Genia Harold assessment and plan. Gatha Mayer, MD, [facility]  We are trying to get single balloon enteroscopy on for Tuesday at midday.  This is the earliest we may be able to do it.  I do think we should do the restaging CT scans while he is here as that will greatly facilitate his oncology care

## 2020-11-23 NOTE — Progress Notes (Signed)
1 Day Post-Op  Subjective: He denies pain, nausea, emesis. He has tolerated clears well. Eager to be done with capsule endoscopy   Objective: Vital signs in last 24 hours: Temp:  [97.2 F (36.2 C)-98.2 F (36.8 C)] 97.9 F (36.6 C) (06/25 0516) Pulse Rate:  [120-128] 128 (06/25 0516) Resp:  [16-24] 18 (06/25 0516) BP: (115-124)/(62-88) 118/62 (06/25 0516) SpO2:  [96 %-100 %] 96 % (06/25 0516) Last BM Date: 11/22/20  Intake/Output from previous day: 06/24 0701 - 06/25 0700 In: 1260 [P.O.:760; I.V.:500] Out: 2100 [Urine:1700; Stool:400] Intake/Output this shift: No intake/output data recorded.  PE: Heart: tachycardic in 120s Lungs: CTAB Abd: soft, NT, ND. Pouch colostomy with gas output - no stool. Abdominal incision with granulating tissue  Lab Results:  Recent Labs    11/21/20 0435 11/21/20 1012 11/22/20 0355 11/22/20 1012 11/22/20 2155 11/23/20 0349  WBC 3.8*  --  3.4*  --   --   --   HGB 6.1*   < > 7.7*   < > 8.3* 7.8*  HCT 18.8*   < > 22.2*   < > 24.6* 23.2*  PLT 62*  --  65*  --   --   --    < > = values in this interval not displayed.    BMET Recent Labs    11/22/20 0355 11/23/20 0349  NA 138 135  K 3.6 3.5  CL 108 104  CO2 22 22  GLUCOSE 85 76  BUN 20 9  CREATININE 0.80 0.90  CALCIUM 7.8* 8.1*    PT/INR Recent Labs    11/20/20 1652  LABPROT 21.2*  INR 1.8*    CMP     Component Value Date/Time   NA 135 11/23/2020 0349   NA 139 01/29/2020 1548   K 3.5 11/23/2020 0349   CL 104 11/23/2020 0349   CO2 22 11/23/2020 0349   GLUCOSE 76 11/23/2020 0349   BUN 9 11/23/2020 0349   BUN 34 (H) 01/29/2020 1548   CREATININE 0.90 11/23/2020 0349   CALCIUM 8.1 (L) 11/23/2020 0349   PROT 6.0 (L) 10/23/2020 0048   PROT 7.3 01/29/2020 1548   ALBUMIN 2.3 (L) 10/23/2020 0048   ALBUMIN 3.9 01/29/2020 1548   AST 15 10/23/2020 0048   ALT 19 10/23/2020 0048   ALKPHOS 63 10/23/2020 0048   BILITOT 1.2 10/23/2020 0048   BILITOT 0.4 01/29/2020 1548    GFRNONAA >60 11/23/2020 0349   GFRAA 79 01/29/2020 1548   Lipase  No results found for: LIPASE     Studies/Results: No results found.  Anti-infectives: Anti-infectives (From admission, onward)    None        Assessment/Plan ABL anemia secondary to GI bleed of unclear etiology - EGD 6/23 with gastritis - no source of melena identified - colonoscopy 6/24 limited due to poor prep - GI completing capsule endoscopy -if he continues to bleed despite holding anticoagulation, all attempts at nonsurgical treatments would need to be made as he is 4 weeks out from his surgery and not a candidate to return to the OR at this point. - holding eliquis - Hgb 7.8 (8.3)  POD 30 S/P sigmoid colectomy with end colostomy 10/24/20 Dr. Donne Hazel for adenocarcinoma of the colon with a + margin -cont routine wound care for midline wound - has seen medical oncology here -  recommending CT chest/abdomen/pelvis with contrast for restaging prior to discharge. - follow up with oncology outpatient as scheduled -follow up with Dr. Donne Hazel as previously  scheduled  FEN - diet per GI for capsule endoscopy VTE - on hold due to severe anemia ID - none needed from my standpoint  A fib/flutter - per cards H/O MVR - echo is stable COPD/tobacco abuse - smokes pipe, per primary  Basal cell carcinoma x2 of LUE - currently undergoing radiation therapy DM - per primary H/O HTN   LOS: 2 days    Winferd Humphrey , [facility] 11/23/2020, 7:48 AM Please see Amion for pager number during day hours 7:00am-4:30pm or 7:00am -11:30am on weekends

## 2020-11-23 NOTE — Progress Notes (Addendum)
Family Medicine Teaching Service Daily Progress Note Intern Pager: [phone]  Patient name: [name] Medical record number: [id number] Date of birth: 04-Jul-1945 Age: 75 y.o. Gender: male  Primary Care Provider: Pcp, No Consultants: gastroenterology, general surgery  Code Status: FULL  Pt Overview and Major Events to Date:  11/23/20: admitted for   Assessment and Plan: OM LIZOTTE is a 75 y.o. male presenting with dyspnea 2/2 GI bleed. PMH is significant for anemia, A. fib, bacteremia, MV replacement, basal cell carcinoma, COPD, dementia, pre-diabetes, DVT, HTN, thrombocytopenia, colon cancer (colostomy).   Symptomatic anemia  GI bleed  s/p sigmoid colectomy and End colostomy Hemoglobin 8.3 this morning. He has received 4 units pRBCs. General surgery recommends continued wound care and follow up outpatient.  Endoscopy did not show source of bleeding yesterday. Capsule study results pending.  - GI following, appreciate recommendations:  outpt follow up with Dr. Donne Hazel as scheduled  - Wound RN consulted, appreciate recommendations  - General surgery consulted, appreciate recommendation  - 5p CBC - Onc recommends CT Chest/Abd/Pelvis (contrast) prior to discharge  - Trend Hgb, Plts  - PT/OT    Atrial Fibrillation - Restarted home Metoprolol and Diltiazem  - Hold home Eliquis    Thrombocytopenia Stable in the 60s.  - Hold VTE ppx also in setting of GI bleed and low platelets  - AM CBC   FEN/GI: CLD  PPx: SCDs   Disposition: pending medical work-up for GI bleed  Subjective:  Pt hungry and disappointment with CLD. Denies hematuria, hematochezia and melena. Denies pain.   Objective: Temp:  [97.2 F (36.2 C)-98.9 F (37.2 C)] 98.9 F (37.2 C) (06/25 0916) Pulse Rate:  [120-128] 124 (06/25 0916) Resp:  [16-24] 19 (06/25 0916) BP: (108-124)/(62-88) 108/71 (06/25 0916) SpO2:  [96 %-100 %] 100 % (06/25 0916)   Physical Exam: General: elderly male resting in  bed in no acute distress  Cardiovascular: tachycardic, irregularly irregular rhythm Respiratory: faint expiratory wheezes  Abdomen: colostomy, soft, non-tender Extremities: no LE edema, no calf tenderness  Skin: warm, dry   Laboratory: Recent Labs  Lab 11/20/20 1406 11/21/20 0435 11/21/20 1012 11/22/20 0355 11/22/20 1012 11/22/20 1556 11/22/20 2155 11/23/20 0349  WBC 4.5 3.8*  --  3.4*  --   --   --   --   HGB 4.5* 6.1*   < > 7.7*   < > 7.8* 8.3* 7.8*  HCT 15.0* 18.8*   < > 22.2*   < > 22.9* 24.6* 23.2*  PLT 69* 62*  --  65*  --   --   --   --    < > = values in this interval not displayed.   Recent Labs  Lab 11/20/20 1406 11/21/20 1012 11/22/20 0355 11/23/20 0349  NA 142 143 138 135  K 4.1 4.2 3.6 3.5  CL 110 109 108 104  CO2 25  --  22 22  BUN 48* 28* 20 9  CREATININE 1.18 0.90 0.80 0.90  CALCIUM 8.1*  --  7.8* 8.1*  GLUCOSE 110* 91 85 76      Imaging/Diagnostic Tests: No results found.   Lyndee Hensen, DO 11/23/2020, 10:20 AM PGY-2, [place] Intern pager: [phone], text pages welcome

## 2020-11-24 ENCOUNTER — Inpatient Hospital Stay (HOSPITAL_COMMUNITY): Payer: Medicare Other

## 2020-11-24 LAB — CBC
HCT: 24.8 % — ABNORMAL LOW (ref 39.0–52.0)
Hemoglobin: 8.3 g/dL — ABNORMAL LOW (ref 13.0–17.0)
MCH: 29.2 pg (ref 26.0–34.0)
MCHC: 33.5 g/dL (ref 30.0–36.0)
MCV: 87.3 fL (ref 80.0–100.0)
Platelets: 91 10*3/uL — ABNORMAL LOW (ref 150–400)
RBC: 2.84 MIL/uL — ABNORMAL LOW (ref 4.22–5.81)
RDW: 16.6 % — ABNORMAL HIGH (ref 11.5–15.5)
WBC: 4.1 10*3/uL (ref 4.0–10.5)
nRBC: 0.7 % — ABNORMAL HIGH (ref 0.0–0.2)

## 2020-11-24 LAB — BASIC METABOLIC PANEL
Anion gap: 6 (ref 5–15)
BUN: 11 mg/dL (ref 8–23)
CO2: 24 mmol/L (ref 22–32)
Calcium: 8.2 mg/dL — ABNORMAL LOW (ref 8.9–10.3)
Chloride: 108 mmol/L (ref 98–111)
Creatinine, Ser: 1.06 mg/dL (ref 0.61–1.24)
GFR, Estimated: >60 mL/min (ref >60)
Glucose, Bld: 102 mg/dL — ABNORMAL HIGH (ref 70–99)
Potassium: 3.8 mmol/L (ref 3.5–5.1)
Sodium: 138 mmol/L (ref 135–145)

## 2020-11-24 LAB — HEMOGLOBIN AND HEMATOCRIT, BLOOD
HCT: 24.4 % — ABNORMAL LOW (ref 39.0–52.0)
HCT: 25.8 % — ABNORMAL LOW (ref 39.0–52.0)
HCT: 23.4 % — ABNORMAL LOW (ref 39.0–52.0)
Hemoglobin: 8.1 g/dL — ABNORMAL LOW (ref 13.0–17.0)
Hemoglobin: 8.6 g/dL — ABNORMAL LOW (ref 13.0–17.0)
Hemoglobin: 7.9 g/dL — ABNORMAL LOW (ref 13.0–17.0)

## 2020-11-24 LAB — MAGNESIUM: Magnesium: 1.8 mg/dL (ref 1.7–2.4)

## 2020-11-24 MED ORDER — IOHEXOL 300 MG/ML  SOLN
100.0000 mL | Freq: Once | INTRAMUSCULAR | Status: AC | PRN
  Administered 2020-11-24: 100 mL via INTRAVENOUS

## 2020-11-24 MED ORDER — SODIUM CHLORIDE 0.9 % IV SOLN: INTRAVENOUS | Status: DC

## 2020-11-24 MED ORDER — METOPROLOL TARTRATE 25 MG PO TABS
25.0000 mg | ORAL_TABLET | Freq: Two times a day (BID) | ORAL | Status: DC
  Administered 2020-11-24 – 2020-11-27 (×7): 25 mg via ORAL
  Filled 2020-11-24 (×7): qty 1

## 2020-11-24 MED ORDER — SODIUM CHLORIDE 0.9 % IV SOLN
125.0000 mg | Freq: Once | INTRAVENOUS | Status: AC
  Administered 2020-11-24: 125 mg via INTRAVENOUS
  Filled 2020-11-24: qty 10

## 2020-11-24 NOTE — Progress Notes (Addendum)
FPTS Interim Progress Note  Patient awake and resting comfortably.  Denies any palpitations or chest pain.  Reports no dizziness, weakness or shortness of breath and no signs of bleeding.  Rounded with primary RN.  Soft BP earlier tonight.  Cardizem and Metoprolol held.  Repeat BP slightly increased and HR now in 120's Afib. Asymptomatic.   Appreciated nightly round.  Today's Vitals   11/23/20 2122 11/23/20 2142 11/23/20 2216 11/24/20 0005  BP: (!) 91/58 (!) 95/51 101/65 97/67  Pulse: (!) 117 74 (!) 121 (!) 104  Resp:  16  18  Temp:  98.5 F (36.9 C)  98.4 F (36.9 C)  TempSrc:  Oral  Oral  SpO2:  96%  98%  Weight:      Height:      PainSc:   0-No pain    Low BP Cardizem and Metoprolol initially held for soft BP.  Repeat manual BP 101/65 and HR 121.  Requested nursing staff to administer medications and monitor BP LR 125/hr started, BP responded to fluid Hbg 8.1 and no signs of bleeding Monitor electrolytes and replete to maintain K>4 and Mag>2 Continue to monitor  Carollee Leitz, MD 11/24/2020, 1:26 AM PGY-2, [place] Medicine Service pager [phone]

## 2020-11-24 NOTE — Progress Notes (Signed)
Patient did not drink contrast solution. Explained that he needed it for CT, verbalized understanding, but didn't drink it. [name] at [place] made aware. Will continue to encourage patient to work on it.

## 2020-11-24 NOTE — Progress Notes (Signed)
Family Medicine Teaching Service Daily Progress Note Intern Pager: [phone]  Patient name: [name] Medical record number: [id number] Date of birth: 1946-02-16 Age: 75 y.o. Gender: male  Primary Care Provider: Pcp, No Consultants: GI, general surgery, oncology Code Status: Full  Pt Overview and Major Events to Date:  6/22-admitted for significant anemia 6/24-colonoscopy and capsule endoscopy  Assessment and Plan: [name] is a 75 y.o. male presenting with dyspnea 2/2 GI bleed. PMH is significant for anemia, A. fib, bacteremia, MV replacement, basal cell carcinoma, COPD, dementia, pre-diabetes, DVT, HTN, thrombocytopenia, colon cancer (colostomy).  Symptomatic anemia  GI bleed  s/p sigmoid colectomy and end colostomy Hgb 8.3 this morning.  S/p total of 4 units PRBC. Patient feels much improved from admission. Blood pressures remain on the softer side with most recent 99/50 and patient without complaints of dizziness, shortness of breath, weakness. 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  GI plans for single balloon enteroscopy. - GI following, appreciate recs - General surgery following, appreciate recs - continue to hold VTE ppx - Follow-up enteroscopy biopsies - Plan for balloon enteroscopy per GI  Adenocarcinoma of the colon Patient has history of stage II colon cancer with concern for metastatic disease from pre-op imaging. Initially was going to have restaging CT scans this week, which will not be obtained while in the hospital.  - Oncology consulted, appreciate recs - Follow-up restaging CT  Atrial fibrillation Patient's home dose of metoprolol is 50 mg twice daily, due to lower blood pressures in the last day, metoprolol was decreased 25 mg twice daily.  We will continue to monitor heart rate and blood pressures. - Continue home diltiazem - Continue  metoprolol 25mg  BID - Holding home Eliquis  Thrombocytopenia Stable and chronic. Platelets 91 today. - Continue to hold VTE ppx  - AM CBC   FEN/GI: Heart healthy PPx: SCDs Dispo:Home with home health  in 2-3 days. Barriers include continued medical work-up.   Subjective:  Patient reports that he feels better than when he got admitted but that he is tired of being in the hospital and is ready to go home  Objective: Temp:  [97.9 F (36.6 C)-98.9 F (37.2 C)] 98.1 F (36.7 C) (06/26 0601) Pulse Rate:  [74-124] 82 (06/26 0601) Resp:  [16-20] 20 (06/26 0601) BP: (91-108)/(48-83) 99/50 (06/26 0601) SpO2:  [96 %-100 %] 98 % (06/26 0601) Physical Exam: General: NAD, sitting up in bed eating breakfast Cardiovascular: Regular rate, irregularly irregular rhythm Respiratory: breathing comfortably on room air, no increased WOB Abdomen: colostomy present, soft, non-tender Extremities: warm without peripheral edema, no calf tenderness, moving all extremities equally and appropriately  Laboratory: Recent Labs  Lab 11/22/20 0355 11/22/20 1012 11/23/20 1452 11/23/20 2256 11/24/20 0355  WBC 3.4*  --  3.0*  --  4.1  HGB 7.7*   < > 7.9* 8.1* 8.3*  HCT 22.2*   < > 23.9* 23.7* 24.8*  PLT 65*  --  85*  --  91*   < > = values in this interval not displayed.   Recent Labs  Lab 11/22/20 0355 11/23/20 0349 11/24/20 0355  NA 138 135 138  K 3.6 3.5 3.8  CL 108 104 108  CO2 22 22 24   BUN 20 9 11   CREATININE 0.80 0.90 1.06  CALCIUM 7.8* 8.1* 8.2*  GLUCOSE 85 76 102*     Imaging/Diagnostic Tests: No results  found.   Rise Patience, DO 11/24/2020, 6:59 AM PGY-1, [place] Intern pager: [phone], text pages welcome

## 2020-11-24 NOTE — Hospital Course (Addendum)
[name] is a 75 y.o. male presenting with dyspnea 2/2 GI bleed. PMH is significant for anemia, A. fib, bacteremia, MV replacement, basal cell carcinoma, COPD, dementia, pre-diabetes, DVT, HTN, thrombocytopenia, colon cancer (colostomy).  Symptomatic anemia  GI bleed Patient admitted with melena in colostomy and dyspnea found to have significant anemia with a hemoglobin of 4.5.  Patient was transfused a total of 4 units PRBC while admitted and VTE prophylaxis and home Eliquis was held.  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  Balloon enteroscopy was done on 6/28 and a single non-bleeding angioectasia was in the jejunum and treated with argon plasma coagulation.  Endoscopy was completed through the jejunal and area was tattooed to the distal extent that was reached.  If there is suspicion for acute rebleeding he may consider a repeat VCE.  If active bleeding noted distal to the tattoo, would not be amenable to repeat single balloon enteroscopy.  Hemoglobin remained stable upon discharge at 8.9.  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  Oncology was consulted in the hospital with recommendations to complete the restaging CT scans.  CT scan showed decrease in size of pelvic adenopathy with stable pulmonary nodules and scattered hepatic hypodensities that are likely benign and new node at the right internal/external iliac bifurcation measuring 68mm.  Patient to follow-up with oncology outpatient.  Atrial fibrillation Due to patient's GI bleed, home Eliquis was held.  Patient's home dose of metoprolol 50 mg twice daily was decreased to 25 mg twice daily due to lower blood pressures during the day. After GI procedures, Eliquis was able to be resumed, though  unsure that patient will take Eliquis after this admission.   Thrombocytopenia Patient was thrombocytopenic on admission with platelets of 69.  DVT prophylaxis was held secondary to thrombocytopenia and active GI bleed.  Remained stable and chronic with platelets of 84 prior to discharge.   All other chronic conditions stable during hospitalization.   Issues for follow-up Recommend GI follow-up for further monitoring.  If recurrence of bleeding, consider repeat VCE.  If active bleeding distal to the tattoo, would not be amenable to repeat single balloon enteroscopy. Follow-up with oncology with plan for assay for circulating tumor DNA. Repeat CBC to monitor Hgb and platelets at follow-up appointment with PCP. Monitor BP as blood pressures were baseline in the 90s/50s with appropriate MAPs.  Home metoprolol was decreased to 25mg  BID though patient had intermittent tachycardia, close monitoring to determine if metoprolol should be increased back to 50mg  BID.  Follow-up with cardiology, patient not agreeable to taking Eliquis further.  Home medication of metoprolol was decreased to 25 mg while in hospital due to lower blood pressures.  May need the dose increased back to prior dose if blood pressures improve.

## 2020-11-24 NOTE — Progress Notes (Signed)
   11/23/20 2216  Assess: MEWS Score  BP 101/65  Pulse Rate (!) 121  Level of Consciousness Alert  Assess: MEWS Score  MEWS Temp 0  MEWS Systolic 0  MEWS Pulse 2  MEWS RR 0  MEWS LOC 0  MEWS Score 2  MEWS Score Color Yellow  Assess: if the MEWS score is Yellow or Red  Were vital signs taken at a resting state? Yes  Focused Assessment No change from prior assessment  Early Detection of Sepsis Score *See Row Information* Low  MEWS guidelines implemented *See Row Information* No, previously yellow, continue vital signs every 4 hours  Treat  MEWS Interventions Administered scheduled meds/treatments  Pain Scale 0-10  Pain Score 0  Faces Pain Scale 0  Patients Stated Pain Goal 0  Take Vital Signs  Increase Vital Sign Frequency  Yellow: Q 2hr X 2 then Q 4hr X 2, if remains yellow, continue Q 4hrs  Escalate  MEWS: Escalate Yellow: discuss with charge nurse/RN and consider discussing with provider and RRT  Notify: Charge Nurse/RN  Name of Charge Nurse/RN Notified Nellie, RN  Date Charge Nurse/RN Notified 11/23/20  Time Charge Nurse/RN Notified 2230  Notify: Provider  Provider Name/Title Volanda Napoleon, MD  Date Provider Notified 11/23/20  Time Provider Notified 2200  Notification Type Page  Notification Reason Other (Comment) (SBP 90s)  Provider response See new orders  Date of Provider Response 11/23/20  Time of Provider Response 2200  Document  Patient Outcome Stabilized after interventions

## 2020-11-25 ENCOUNTER — Encounter (HOSPITAL_COMMUNITY): Payer: Self-pay | Admitting: Internal Medicine

## 2020-11-25 DIAGNOSIS — K921 Melena: Secondary | ICD-10-CM

## 2020-11-25 DIAGNOSIS — C182 Malignant neoplasm of ascending colon: Secondary | ICD-10-CM

## 2020-11-25 DIAGNOSIS — D696 Thrombocytopenia, unspecified: Secondary | ICD-10-CM

## 2020-11-25 DIAGNOSIS — K5521 Angiodysplasia of colon with hemorrhage: Secondary | ICD-10-CM

## 2020-11-25 DIAGNOSIS — D500 Iron deficiency anemia secondary to blood loss (chronic): Secondary | ICD-10-CM

## 2020-11-25 LAB — CBC
HCT: 27.5 % — ABNORMAL LOW (ref 39.0–52.0)
Hemoglobin: 9 g/dL — ABNORMAL LOW (ref 13.0–17.0)
MCH: 29.2 pg (ref 26.0–34.0)
MCHC: 32.7 g/dL (ref 30.0–36.0)
MCV: 89.3 fL (ref 80.0–100.0)
Platelets: 89 10*3/uL — ABNORMAL LOW (ref 150–400)
RBC: 3.08 MIL/uL — ABNORMAL LOW (ref 4.22–5.81)
RDW: 16.7 % — ABNORMAL HIGH (ref 11.5–15.5)
WBC: 4.1 10*3/uL (ref 4.0–10.5)
nRBC: 1 % — ABNORMAL HIGH (ref 0.0–0.2)

## 2020-11-25 LAB — HEMOGLOBIN AND HEMATOCRIT, BLOOD
HCT: 29.3 % — ABNORMAL LOW (ref 39.0–52.0)
HCT: 28.4 % — ABNORMAL LOW (ref 39.0–52.0)
HCT: 26.4 % — ABNORMAL LOW (ref 39.0–52.0)
Hemoglobin: 9.7 g/dL — ABNORMAL LOW (ref 13.0–17.0)
Hemoglobin: 9.4 g/dL — ABNORMAL LOW (ref 13.0–17.0)
Hemoglobin: 8.7 g/dL — ABNORMAL LOW (ref 13.0–17.0)

## 2020-11-25 MED ORDER — BOOST PLUS PO LIQD
237.0000 mL | Freq: Three times a day (TID) | ORAL | Status: DC
  Administered 2020-11-26: 237 mL via ORAL
  Filled 2020-11-25 (×8): qty 237

## 2020-11-25 MED ORDER — DIPHENHYDRAMINE HCL 50 MG/ML IJ SOLN
12.5000 mg | Freq: Once | INTRAMUSCULAR | Status: AC
  Administered 2020-11-25: 12.5 mg via INTRAMUSCULAR
  Filled 2020-11-25: qty 1

## 2020-11-25 MED ORDER — DIPHENHYDRAMINE HCL 25 MG PO CAPS: 25.0000 mg | ORAL_CAPSULE | Freq: Once | ORAL | Status: DC

## 2020-11-25 NOTE — TOC Initial Note (Signed)
Transition of Care [facility]) - Initial/Assessment Note    Patient Details  Name: [name] MRN: [id number] Date of Birth: [birth year]  [facility] of [facility]) CM/SW Contact:    Marilu Favre, RN Phone Number: 11/25/2020, 12:57 PM  Clinical Narrative:                 Patient from home with son. Active with [facility]. Resumption of care orders entered. Stacie with [facility] aware.  Expected Discharge Plan: [place]     Patient Goals and CMS Choice Patient states their goals for this hospitalization and ongoing recovery are:: to return tohome CMS Medicare.gov Compare Post Acute Care list provided to:: Patient Choice offered to / list presented to : Patient  Expected Discharge Plan and Services Expected Discharge Plan: [place]   Discharge Planning Services: CM Consult Post Acute Care Choice: [place] arrangements for the past 2 months: Single Family Home                 DME Arranged: N/A DME Agency: NA       HH Arranged: RN, PT HH Agency: [place] Date [place]: 11/25/20 Time [place]: 83 Representative spoke with at [place]: [place] Arrangements/Services Living arrangements for the past 2 months: [place] with:: Adult Children Patient language and need for interpreter reviewed:: Yes Do you feel safe going back to the place where you live?: Yes      Need for Family Participation in Patient Care: Yes (Comment) Care giver support system in place?: Yes (comment)   Criminal Activity/Legal Involvement Pertinent to Current Situation/Hospitalization: No - Comment as needed  Activities of Daily Living      Permission Sought/Granted   Permission granted to share information with : No              Emotional Assessment Appearance:: Appears stated age Attitude/Demeanor/Rapport: Engaged Affect (typically observed):  Accepting Orientation: : Oriented to Self, Oriented to Place, Oriented to  Time, Oriented to Situation Alcohol / Substance Use: Not Applicable Psych Involvement: No (comment)  Admission diagnosis:  Anemia [D64.9] Upper GI bleed [K92.2] Symptomatic anemia [D64.9] Patient Active Problem List   Diagnosis Date Noted   Angiodysplasia of intestine with hemorrhage    Melena    Anemia 11/21/2020   Symptomatic anemia 11/20/2020   Upper GI bleed    Protein-calorie malnutrition, severe 10/23/2020   Occult blood in stools    Iron deficiency anemia due to chronic blood loss    Atrial flutter ([place]) 10/17/2020   Microcytic anemia 10/17/2020   COPD (chronic obstructive pulmonary disease) ([place]) 10/17/2020   Atrial fibrillation with RVR ([place]) 10/17/2020   Basal cell carcinoma (BCC) of skin of left wrist 09/04/2020   Acute bronchitis with COPD ([place]) 01/29/2020   Skin lesions 01/29/2020   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   Hx of bacterial endocarditis 01/29/2020   Frequent falls 01/29/2020   H/O mitral valve replacement 01/29/2020   PCP:  Pcp, No Pharmacy:   [id number] [place], [address] AT [facility] [place] [address] Phone: [phone] Fax: [phone]  [facility] [address] [place] [address] Phone: [phone] Fax: [phone]     Social Determinants of Health (SDOH) Interventions    Readmission Risk Interventions No flowsheet data found.

## 2020-11-25 NOTE — Progress Notes (Signed)
Physical Therapy Treatment Patient Details Name: [name] MRN: [id number] DOB: 1945-06-13 Today's Date: 11/25/2020    History of Present Illness 75 y/o male presented to the ED on 6/22 for dyspnea and symptomatic anemia 2/2 GI bleed. 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 Patient underwent enteroscopy with finding of gastritis. Colonscopy performed but did not show definitive source of bleeding. 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  He is not interested in much help from PT, but is not really quite safe alone.  Follow acutely to work on control of balance, but was restricted by feeling a bit nauseated today.  Will retry a longer trip next visit, and work on challenges of obstacles and turns, backing up and sidestepping.   Follow Up Recommendations  Home health PT     Equipment Recommendations  None recommended by PT    Recommendations for Other Services       Precautions / Restrictions Precautions Precautions: Fall Precaution Comments: ostomy, watch HR Restrictions Weight Bearing Restrictions: No    Mobility  Bed Mobility Overal bed mobility: Modified Independent             General bed mobility comments: mod I for bed mobility +use of rail    Transfers Overall transfer level: Modified independent Equipment used: None Transfers: Sit to/from Stand Sit to Stand: Supervision         General transfer comment: pt is using furniture and wall bars  Ambulation/Gait Ambulation/Gait assistance: Supervision Gait Distance (Feet): 35 Feet Assistive device: None Gait Pattern/deviations: Step-to pattern;Step-through pattern;Wide base of  support;Drifts right/left Gait velocity: mild variability Gait velocity interpretation: <1.31 ft/sec, indicative of household ambulator General Gait Details: pt is demonstrating shorter steps with lateral shifts but no outright LOB   Stairs             Wheelchair Mobility    Modified Rankin (Stroke Patients Only)       Balance Overall balance assessment: Mild deficits observed, not formally tested Sitting-balance support: Feet supported Sitting balance-Leahy Scale: Good     Standing balance support: Single extremity supported;During functional activity Standing balance-Leahy Scale: Fair Standing balance comment: reaching for the furniture at times                            Cognition Arousal/Alertness: Awake/alert Behavior During Therapy: WFL for tasks assessed/performed Overall Cognitive Status: History of cognitive impairments - at baseline                                 General Comments: pt is insisting he can walk around alone but is quite unsafe      Exercises      General Comments General comments (skin integrity, edema, etc.): pt is seemingly unaware of his limits of balance      Pertinent Vitals/Pain Pain Assessment: Faces Pain Score: 0-No pain Faces Pain Scale: No hurt Pain Intervention(s): Monitored during session    Home Living                      Prior Function  PT Goals (current goals can now be found in the care plan section) Acute Rehab PT Goals Patient Stated Goal: return home    Frequency    Min 3X/week      PT Plan Current plan remains appropriate    Co-evaluation              AM-PAC PT "6 Clicks" Mobility   Outcome Measure  Help needed turning from your back to your side while in a flat bed without using bedrails?: None Help needed moving from lying on your back to sitting on the side of a flat bed without using bedrails?: None Help needed moving to and from a bed to  a chair (including a wheelchair)?: None Help needed standing up from a chair using your arms (e.g., wheelchair or bedside chair)?: None Help needed to walk in hospital room?: A Little Help needed climbing 3-5 steps with a railing? : A Lot 6 Click Score: 21    End of Session Equipment Utilized During Treatment: Gait belt Activity Tolerance: Patient tolerated treatment well;Treatment limited secondary to medical complications (Comment) Patient left: in bed;with call bell/phone within reach;with bed alarm set Nurse Communication: Mobility status PT Visit Diagnosis: Other abnormalities of gait and mobility (R26.89)     Time: 1511-1530 PT Time Calculation (min) (ACUTE ONLY): 19 min  Charges:                       Ramond Dial 11/25/2020, 5:55 PM  Mee Hives, PT MS Acute Rehab Dept. Number: [place]

## 2020-11-25 NOTE — Progress Notes (Addendum)
Received page from nurse about patient complaining of itchiness and rash.  Came to see patient, patient indicates he feels he is allergic to shampoo used yesterday during bath.  Denies any shortnesds of breath or other symptoms.  Some redness and excoriations on skin.  No hives.  Patient responding appropriately and asking for relief from itching. - Give one time IM Benadryl 12.5 mg   Delora Fuel, MD 11/25/2020, 4:24 AM PGY-1, [place] Medicine Service pager [phone]

## 2020-11-25 NOTE — Progress Notes (Addendum)
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 since midnight.  Tells me there is a banana on his bedside table and he is about to eat it because he "just cannot go without food".  He thinks " you got your wires crossed about eating".  Denies any new complaints or concerns.   Objective   Vital signs in last 24 hours: Temp:  [97.5 F (36.4 C)-98.7 F (37.1 C)] 97.9 F (36.6 C) (06/27 0548) Pulse Rate:  [80-125] 99 (06/27 0548) Resp:  [16-18] 18 (06/27 0548) BP: (103-119)/(62-78) 119/62 (06/27 0548) SpO2:  [96 %-100 %] 98 % (06/27 0548) Last BM Date: 11/24/20 General:    elderly white male in NAD Heart:  Regular rate and rhythm; no murmurs Lungs: Respirations even and unlabored, lungs CTA bilaterally Abdomen:  Soft, nontender and nondistended. Normal bowel sounds. Psych:  Cooperative. Normal mood and affect.  Intake/Output from previous day: 06/26 0701 - 06/27 0700 In: 700 [P.O.:700] Out: 1440 [Urine:1440]   Lab Results: Recent Labs    11/23/20 1452 11/23/20 2256 11/24/20 0355 11/24/20 0951 11/24/20 2135 11/25/20 0332 11/25/20 1007  WBC 3.0*  --  4.1  --   --  4.1  --   HGB 7.9*   < > 8.3*   < > 7.9* 9.0* 9.7*  HCT 23.9*   < > 24.8*   < > 23.4* 27.5* 29.3*  PLT 85*  --  91*  --   --  89*  --    < > = values in this interval not displayed.    BMET Recent Labs    11/23/20 0349 11/24/20 0355  NA 135 138  K 3.5 3.8  CL 104 108  CO2 22 24  GLUCOSE 76 102*  BUN 9 11  CREATININE 0.90 1.06  CALCIUM 8.1* 8.2*    Studies/Results: CT CHEST ABDOMEN PELVIS W CONTRAST  Result Date: 11/25/2020 CLINICAL DATA:  Colorectal cancer, staging evaluation history of GI bleed. EXAM: CT CHEST, ABDOMEN, AND PELVIS WITH CONTRAST TECHNIQUE: Multidetector CT imaging of the chest, abdomen and pelvis was performed following the standard protocol during bolus administration of intravenous contrast. CONTRAST:  1101mL OMNIPAQUE IOHEXOL 300 MG/ML  SOLN COMPARISON:  Oct 22, 2020. FINDINGS: CT CHEST FINDINGS Cardiovascular: Mitral valve replacement. Mild cardiac enlargement. No substantial pericardial effusion. Normal caliber of the thoracic aorta. Post median sternotomy for CABG. Central pulmonary vasculature is unremarkable on venous phase. Mediastinum/Nodes: Thoracic inlet structures are normal. No mediastinal adenopathy. No axillary lymphadenopathy. Lungs/Pleura: Interval development of bilateral pleural fluid collections since previous imaging from May 24 is, small to moderate on the LEFT and small on the RIGHT. Associated basilar airspace disease. Paraseptal emphysema at the lung apices. 6 mm. Unchanged. Airways are patent proximally with filling defects in LEFT lower lobe bronchi greater than RIGHT at the periphery. Small pulmonary nodule in the RIGHT lung base (image 115/5) RIGHT lower lobe at 5 mm is unchanged. Parenchymal distortion  in the RIGHT upper lobe is unchanged. Musculoskeletal: See below for full musculoskeletal details. CT ABDOMEN PELVIS FINDINGS Hepatobiliary: Scattered low-density hepatic lesions most with the appearance of cysts though some too small for definitive characterization. Tiny area of hypodensity in the RIGHT hepatic lobe (image 72/3) 5 mm. Seen on coronal images on previous imaging and grossly unchanged. Other areas without change.  Portal vein is patent. No pericholecystic stranding or biliary duct dilation. Pancreas: Normal, without mass, inflammation or ductal dilatation. Spleen: Spleen normal size and contour without focal lesion. Adrenals/Urinary Tract: Adrenal glands are normal. Low-attenuation lesions in the bilateral kidneys, largest compatible with simple cyst in the posterior LEFT kidney interpolar aspect. Other areas likely tiny cysts. No hydronephrosis. No perinephric stranding. Urinary bladder is smooth with respect to contour. No perivesical stranding. Stomach/Bowel: Stomach moderately distended with ingested contents. No signs of acute small bowel process. Post partial colectomy and LEFT lower quadrant ostomy. Suture lines in the pelvis at the apex of the rectal stump. This is similar to the prior study. Vascular/Lymphatic: Aortic atherosclerosis. No aneurysmal dilation. Smooth contour of the IVC. RIGHT external iliac lymph node at the internal-external bifurcation in the RIGHT pelvis 11 mm (image 114/3). This was not present previously but there is decrease of the iliac nodes that were seen on the prior exam, now less than 1 cm size. (Image 104/3) 8 mm RIGHT common iliac lymph node previously 16 mm. Resolution of external iliac lymph node that was present previously with small posterior RIGHT external iliac lymph node less than a cm that is unchanged on image 113 of series 3 approximately 8 mm. Reproductive: Prostate unremarkable by CT. Other: No ascites. No peritoneal nodularity. No free air or focal fluid collection. Musculoskeletal: Spinal degenerative changes. No acute or destructive bone process. IMPRESSION: Interval resection of large colonic mass since Oct 22, 2020 with decreased size of RIGHT pelvic adenopathy with respect to lymph nodes that were present previously but with a new lymph node at the RIGHT internal/external iliac bifurcation as described. Small pulmonary nodules are unchanged. Signs of suspected aspiration  pneumonia with basilar airspace disease and small effusions. Signs of pulmonary emphysema. Scattered hepatic hypodensities likely benign, suggest attention on follow-up. Aortic Atherosclerosis (ICD10-I70.0) and Emphysema (ICD10-J43.9). Electronically Signed   By: Zetta Bills M.D.   On: 11/25/2020 08:54      Assessment / Plan:   Assessment: 1.  Anemia: Recent sigmoid resection and end ileostomy for sigmoid adenocarcinoma stage II, work-up with EGD and colonoscopy unrevealing, capsule endoscopy revealed active oozing from a probable AVM in the mid to distal small bowel and another nonbleeding small AVM, plans for single balloon enteroscopy for diagnosis and treatment tomorrow afternoon, hemoglobin stable 2.  A. fib: Eliquis on hold 3.  COPD 4.  Dementia  Plan: 1.  Continued patient on regular diet today.  He will be n.p.o. at midnight. 2.  Continue to trend hemoglobin with transfusion as needed less than 7 3.  Patient is arranged for single balloon enteroscopy tomorrow afternoon at 12:00, Endo staff will be up to get him around 11:00.  This will be done with Dr. Bryan Lemma.  Reviewed risks, benefits, limitations and alternatives and patient agrees to proceed. 4.  Please await any further recommendations from Dr. Silverio Decamp later today.  Thank you for your kind consultation, we will continue to follow.   LOS: 4 days   Levin Erp  11/25/2020, 12:13 PM   Attending physician's note   I have taken an interval history,  reviewed the chart and examined the patient. I agree with the Advanced Practitioner's note, impression and recommendations.   Possible small bowel AVM, plan for single balloon enteroscopy tomorrow  Monitor hemoglobin and transfuse if below 7 N.p.o. after midnight  The risks and benefits as well as alternatives of endoscopic procedure(s) have been discussed and reviewed. All questions answered. The patient agrees to proceed.   The patient was provided an opportunity  to ask questions and all were answered. The patient agreed with the plan and demonstrated an understanding of the instructions.  [name] , MD [phone]

## 2020-11-25 NOTE — Progress Notes (Signed)
   11/25/20 0358  Vitals  Temp (!) 97.5 F (36.4 C)  Temp Source Oral  BP 110/71  MAP (mmHg) 84  BP Location Right Arm  BP Method Automatic  Patient Position (if appropriate) Lying  Pulse Rate (!) 125  Resp 18  Level of Consciousness  Level of Consciousness Alert  MEWS COLOR  MEWS Score Color Yellow  Oxygen Therapy  SpO2 97 %  O2 Device Room Air  Pain Assessment  Pain Scale 0-10  Pain Score 0  MEWS Score  MEWS Temp 0  MEWS Systolic 0  MEWS Pulse 2  MEWS RR 0  MEWS LOC 0  MEWS Score 2  Provider Notification  Provider Name/Title Trula Ore MD  Date Provider Notified 11/25/20  Time Provider Notified [phone]  Notification Type Page  Notification Reason Other (Comment) (c/o generalized itching, reaction from soap ?)  Provider response At bedside;See new orders  Date of Provider Response 11/25/20  Time of Provider Response 0422   Patient c/o generalized itching. Stated that it was from liquid soap they used to wash him yesterday. Denies SOB. Denies CP. Maness, MD notified. See new orders.

## 2020-11-25 NOTE — H&P (View-Only) (Signed)
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 since midnight.  Tells me there is a banana on his bedside table and he is about to eat it because he "just cannot go without food".  He thinks " you got your wires crossed about eating".  Denies any new complaints or concerns.   Objective   Vital signs in last 24 hours: Temp:  [97.5 F (36.4 C)-98.7 F (37.1 C)] 97.9 F (36.6 C) (06/27 0548) Pulse Rate:  [80-125] 99 (06/27 0548) Resp:  [16-18] 18 (06/27 0548) BP: (103-119)/(62-78) 119/62 (06/27 0548) SpO2:  [96 %-100 %] 98 % (06/27 0548) Last BM Date: 11/24/20 General:    elderly white male in NAD Heart:  Regular rate and rhythm; no murmurs Lungs: Respirations even and unlabored, lungs CTA bilaterally Abdomen:  Soft, nontender and nondistended. Normal bowel sounds. Psych:  Cooperative. Normal mood and affect.  Intake/Output from previous day: 06/26 0701 - 06/27 0700 In: 700 [P.O.:700] Out: 1440 [Urine:1440]   Lab Results: Recent Labs    11/23/20 1452 11/23/20 2256 11/24/20 0355 11/24/20 0951 11/24/20 2135 11/25/20 0332 11/25/20 1007  WBC 3.0*  --  4.1  --   --  4.1  --   HGB 7.9*   < > 8.3*   < > 7.9* 9.0* 9.7*  HCT 23.9*   < > 24.8*   < > 23.4* 27.5* 29.3*  PLT 85*  --  91*  --   --  89*  --    < > = values in this interval not displayed.    BMET Recent Labs    11/23/20 0349 11/24/20 0355  NA 135 138  K 3.5 3.8  CL 104 108  CO2 22 24  GLUCOSE 76 102*  BUN 9 11  CREATININE 0.90 1.06  CALCIUM 8.1* 8.2*    Studies/Results: CT CHEST ABDOMEN PELVIS W CONTRAST  Result Date: 11/25/2020 CLINICAL DATA:  Colorectal cancer, staging evaluation history of GI bleed. EXAM: CT CHEST, ABDOMEN, AND PELVIS WITH CONTRAST TECHNIQUE: Multidetector CT imaging of the chest, abdomen and pelvis was performed following the standard protocol during bolus administration of intravenous contrast. CONTRAST:  124mL OMNIPAQUE IOHEXOL 300 MG/ML  SOLN COMPARISON:  Oct 22, 2020. FINDINGS: CT CHEST FINDINGS Cardiovascular: Mitral valve replacement. Mild cardiac enlargement. No substantial pericardial effusion. Normal caliber of the thoracic aorta. Post median sternotomy for CABG. Central pulmonary vasculature is unremarkable on venous phase. Mediastinum/Nodes: Thoracic inlet structures are normal. No mediastinal adenopathy. No axillary lymphadenopathy. Lungs/Pleura: Interval development of bilateral pleural fluid collections since previous imaging from May 24 is, small to moderate on the LEFT and small on the RIGHT. Associated basilar airspace disease. Paraseptal emphysema at the lung apices. 6 mm. Unchanged. Airways are patent proximally with filling defects in LEFT lower lobe bronchi greater than RIGHT at the periphery. Small pulmonary nodule in the RIGHT lung base (image 115/5) RIGHT lower lobe at 5 mm is unchanged. Parenchymal distortion  in the RIGHT upper lobe is unchanged. Musculoskeletal: See below for full musculoskeletal details. CT ABDOMEN PELVIS FINDINGS Hepatobiliary: Scattered low-density hepatic lesions most with the appearance of cysts though some too small for definitive characterization. Tiny area of hypodensity in the RIGHT hepatic lobe (image 72/3) 5 mm. Seen on coronal images on previous imaging and grossly unchanged. Other areas without change.  Portal vein is patent. No pericholecystic stranding or biliary duct dilation. Pancreas: Normal, without mass, inflammation or ductal dilatation. Spleen: Spleen normal size and contour without focal lesion. Adrenals/Urinary Tract: Adrenal glands are normal. Low-attenuation lesions in the bilateral kidneys, largest compatible with simple cyst in the posterior LEFT kidney interpolar aspect. Other areas likely tiny cysts. No hydronephrosis. No perinephric stranding. Urinary bladder is smooth with respect to contour. No perivesical stranding. Stomach/Bowel: Stomach moderately distended with ingested contents. No signs of acute small bowel process. Post partial colectomy and LEFT lower quadrant ostomy. Suture lines in the pelvis at the apex of the rectal stump. This is similar to the prior study. Vascular/Lymphatic: Aortic atherosclerosis. No aneurysmal dilation. Smooth contour of the IVC. RIGHT external iliac lymph node at the internal-external bifurcation in the RIGHT pelvis 11 mm (image 114/3). This was not present previously but there is decrease of the iliac nodes that were seen on the prior exam, now less than 1 cm size. (Image 104/3) 8 mm RIGHT common iliac lymph node previously 16 mm. Resolution of external iliac lymph node that was present previously with small posterior RIGHT external iliac lymph node less than a cm that is unchanged on image 113 of series 3 approximately 8 mm. Reproductive: Prostate unremarkable by CT. Other: No ascites. No peritoneal nodularity. No free air or focal fluid collection. Musculoskeletal: Spinal degenerative changes. No acute or destructive bone process. IMPRESSION: Interval resection of large colonic mass since Oct 22, 2020 with decreased size of RIGHT pelvic adenopathy with respect to lymph nodes that were present previously but with a new lymph node at the RIGHT internal/external iliac bifurcation as described. Small pulmonary nodules are unchanged. Signs of suspected aspiration  pneumonia with basilar airspace disease and small effusions. Signs of pulmonary emphysema. Scattered hepatic hypodensities likely benign, suggest attention on follow-up. Aortic Atherosclerosis (ICD10-I70.0) and Emphysema (ICD10-J43.9). Electronically Signed   By: Zetta Bills M.D.   On: 11/25/2020 08:54      Assessment / Plan:   Assessment: 1.  Anemia: Recent sigmoid resection and end ileostomy for sigmoid adenocarcinoma stage II, work-up with EGD and colonoscopy unrevealing, capsule endoscopy revealed active oozing from a probable AVM in the mid to distal small bowel and another nonbleeding small AVM, plans for single balloon enteroscopy for diagnosis and treatment tomorrow afternoon, hemoglobin stable 2.  A. fib: Eliquis on hold 3.  COPD 4.  Dementia  Plan: 1.  Continued patient on regular diet today.  He will be n.p.o. at midnight. 2.  Continue to trend hemoglobin with transfusion as needed less than 7 3.  Patient is arranged for single balloon enteroscopy tomorrow afternoon at 12:00, Endo staff will be up to get him around 11:00.  This will be done with Dr. Bryan Lemma.  Reviewed risks, benefits, limitations and alternatives and patient agrees to proceed. 4.  Please await any further recommendations from Dr. Silverio Decamp later today.  Thank you for your kind consultation, we will continue to follow.   LOS: 4 days   Levin Erp  11/25/2020, 12:13 PM   Attending physician's note   I have taken an interval history,  reviewed the chart and examined the patient. I agree with the Advanced Practitioner's note, impression and recommendations.   Possible small bowel AVM, plan for single balloon enteroscopy tomorrow  Monitor hemoglobin and transfuse if below 7 N.p.o. after midnight  The risks and benefits as well as alternatives of endoscopic procedure(s) have been discussed and reviewed. All questions answered. The patient agrees to proceed.   The patient was provided an opportunity  to ask questions and all were answered. The patient agreed with the plan and demonstrated an understanding of the instructions.  [name] , MD [phone]

## 2020-11-25 NOTE — Progress Notes (Addendum)
HEMATOLOGY-ONCOLOGY PROGRESS NOTE  SUBJECTIVE: Patient is eating lunch at the time of my visit.  He offers no specific complaints today. Awaiting return enteroscopy scheduled for 6/28. Wants to be discharged.   PHYSICAL EXAMINATION:  Vitals:   11/25/20 0358 11/25/20 0548  BP: 110/71 119/62  Pulse: (!) 125 99  Resp: 18 18  Temp: (!) 97.5 F (36.4 C) 97.9 F (36.6 C)  SpO2: 97% 98%   Filed Weights   11/20/20 1737 11/21/20 1000  Weight: 65.3 kg 65.3 kg    Intake/Output from previous day: 06/26 0701 - 06/27 0700 In: 700 [P.O.:700] Out: 1440 [Urine:1440]  GENERAL: Chronically ill-appearing male, no distress SKIN: Ulcerated lesion left hand with irregular border LUNGS: clear to auscultation and percussion with normal breathing effort HEART: irregular ABDOMEN: Positive bowel sounds, soft, left ostomy with liquid dark brown stool, gauze dressing at the midline wound NEURO: alert & oriented   LABORATORY DATA:  I have reviewed the data as listed CMP Latest Ref Rng & Units 11/24/2020 11/23/2020 11/22/2020  Glucose 70 - 99 mg/dL 102(H) 76 85  BUN 8 - 23 mg/dL 11 9 20   Creatinine 0.61 - 1.24 mg/dL 1.06 0.90 0.80  Sodium 135 - 145 mmol/L 138 135 138  Potassium 3.5 - 5.1 mmol/L 3.8 3.5 3.6  Chloride 98 - 111 mmol/L 108 104 108  CO2 22 - 32 mmol/L 24 22 22   Calcium 8.9 - 10.3 mg/dL 8.2(L) 8.1(L) 7.8(L)  Total Protein 6.5 - 8.1 g/dL - - -  Total Bilirubin 0.3 - 1.2 mg/dL - - -  Alkaline Phos 38 - 126 U/L - - -  AST 15 - 41 U/L - - -  ALT 0 - 44 U/L - - -    Lab Results  Component Value Date   WBC 4.1 11/25/2020   HGB 9.0 (L) 11/25/2020   HCT 27.5 (L) 11/25/2020   MCV 89.3 11/25/2020   PLT 89 (L) 11/25/2020   NEUTROABS 3.2 11/20/2020    DG Chest Port 1 View  Result Date: 11/20/2020 CLINICAL DATA:  Shortness of breath, hypotension EXAM: PORTABLE CHEST 1 VIEW COMPARISON:  10/24/2020 FINDINGS: Cardiomegaly status post median sternotomy. Both lungs are clear. The visualized  skeletal structures are unremarkable. IMPRESSION: Cardiomegaly without acute abnormality of the lungs in AP portable projection. Electronically Signed   By: Eddie Candle M.D.   On: 11/20/2020 14:10   DG Abd Portable 1V  Result Date: 10/29/2020 CLINICAL DATA:  Abdominal pain and distension following abdominal surgery EXAM: PORTABLE ABDOMEN - 1 VIEW COMPARISON:  Portable exam 1030 hours compared to CT abdomen and pelvis 10/22/2020 FINDINGS: Ostomy RIGHT lower quadrant. Surgical drain in pelvis. Numerous loops of air-filled mildly distended small bowel. Increased stool in RIGHT colon. Findings most likely represent postoperative ileus. No acute osseous findings or urinary tract calcification. IMPRESSION: Probable postoperative ileus. Electronically Signed   By: Lavonia Dana M.D.   On: 10/29/2020 10:43    ASSESSMENT AND PLAN: 1.  Sigmoid colon adenocarcinoma -Colonoscopy 10/21/2020- partially obstructing mass found in the sigmoid colon consistent with adenocarcinoma. -CEA on 10/22/2020 was 3.9 -CTs 10/22/2020-colonic mass with pelvic lymphadenopathy including right common iliac and external iliac nodes, small pulmonary nodules -10/24/2020-sigmoid colectomy, end colostomy, tumor stuck to pelvic sidewall with rind of tissue remaining at completion of surgery -Pathology- moderately differentiated adenocarcinoma the sigmoid colon, tumor extends into pericolonic connective tissue, no lymphovascular perineural invasion, 0/14 lymph nodes, carcinoma involves an area with the specimen was disrupted at mesenteric margin. -CT chest/abdomen/pelvis with  contrast 11/24/20 -interval resection of the large colonic mass, decrease in size of right pelvic adenopathy, new node at the right internal/external iliac bifurcation, small pulmonary nodules are unchanged, scattered hepatic hypodensities likely benign. 2.  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 3.  Thrombocytopenia 4.  Atrial flutter 5.  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  COPD 7.  Hypertension 8.  Diabetes mellitus 9.  Dementia 10.  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 Martin Majestic who is going to check with some Mohs specialist to see if disease is resectable, they were holding on radiation appointments until this could be explored. -Established care with the wound center on 09/13/2019 12.  Hospital admission 11/20/2020-GI bleed  Shawn. Thomas has been admitted for GI bleed.  He has received 4 units PRBC so far.  His Eliquis has been discontinued.  The patient was noted to have probable AVM with active oozing in the mid small bowel and another small AVM more distally on capsule endoscopy.  Plan is for balloon enteroscopy 6/28.  His hemoglobin remains stable.  He also has chronic thrombocytopenia and platelet count overall remains stable.  The patient has stage II colon cancer with mesenteric margin and question of pelvic lymph nodes and lung nodules noted on initial CT scans.  Repeat CT scans performed earlier today showed a decrease in size of pelvic adenopathy with stable pulmonary nodules and scattered hepatic hypodensities are likely benign.  There is a new node at the right internal/external iliac bifurcation measuring 11 mm.  Recommendations: 1.  Proceed with balloon enteroscopy as scheduled 6/28 per GI. 2.  Continue to hold Eliquis. 3.  Transfuse as needed for symptomatic anemia. 4.  Monitor platelets intermittently-overall stable at this time. 5.  CT scan results and imaging to be reviewed by Dr. Benay Spice with further recommendations later today.   LOS: 4 days   Mikey Bussing, DNP, AGPCNP-BC, AOCNP 11/25/20 Shawn Fare was interviewed and examined.  He denies further bleeding.  He was noted to  have a bleeding AVM on a capsule endoscopy.  He is scheduled for a small bowel enteroscopy tomorrow.  The restaging CTs were no stable lung nodules and a decrease in pelvic adenopathy.  No clear evidence of metastatic disease.  I will discuss the risk and potential benefit of adjuvant capecitabine with Shawn. Favor.  Outpatient follow-up will be scheduled at the Cancer center.  I was present for greater than 50% of today's visit.  I performed medical decision making.

## 2020-11-25 NOTE — Progress Notes (Signed)
Occupational Therapy Treatment Patient Details Name: [name] MRN: [id number] DOB: 22-May-1946 Today's Date: 11/25/2020    History of present illness 75 y/o male presented to the ED on 6/22 for dyspnea and symptomatic anemia 2/2 GI bleed. 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 Patient underwent enteroscopy with finding of gastritis. Colonscopy performed but did not show definitive source of bleeding. 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 Pt completed all mobility at a mod I / supervision level this session for safety. Pt is impulsive with his movement with poor safety awareness however did not experience LOB this session. He declined need to complete any ADLs. Pt continues to benefit from OT acutely to maximize function in ADLs and mobility. D/c recommendation remains appropriate.    Follow Up Recommendations  Supervision - Intermittent;No OT follow up    Equipment Recommendations  None recommended by OT       Precautions / Restrictions Precautions Precautions: Fall Precaution Comments: ostomy, watch HR Restrictions Weight Bearing Restrictions: No       Mobility Bed Mobility Overal bed mobility: Modified Independent             General bed mobility comments: mod I for bed mobility +use of rail    Transfers Overall transfer level: Modified independent Equipment used: None Transfers: Sit to/from Stand Sit to Stand: Supervision         General transfer comment: pt reports he uses a cane at baseline, no AD used this session    Balance Overall balance assessment: No apparent balance  deficits (not formally assessed) Sitting-balance support: No upper extremity supported Sitting balance-Leahy Scale: Good     Standing balance support: Single extremity supported;During functional activity Standing balance-Leahy Scale: Fair Standing balance comment: pt noted using unilateral UE on external support while ambulating withotu AD in the room         ADL either performed or assessed with clinical judgement   ADL Overall ADL's : Needs assistance/impaired               Toilet Transfer: Supervision/safety;Ambulation;Comfort height toilet;Grab bars Toilet Transfer Details (indicate cue type and reason): simulated this session - pt impulsive with movment, no LOB         Functional mobility during ADLs: Supervision/safety General ADL Comments: Pt declined need for completing all ADLs      Cognition Arousal/Alertness: Awake/alert Behavior During Therapy: WFL for tasks assessed/performed Overall Cognitive Status: History of cognitive impairments - at baseline             General Comments: Follows commands but does have decreased safety awareness and memory (pt became slightly agitated this session and was impulsive with movement)              General Comments no new concerns this session - pt requesting help wtih ordering dinner, RN notifed    Pertinent Vitals/ Pain       Pain Assessment: Faces Pain Score: 0-No pain Pain Intervention(s): Monitored during session   Frequency  Min 2X/week        Progress Toward Goals  OT Goals(current goals can now be found in the care plan section)  Progress towards OT  goals: Progressing toward goals  Acute Rehab OT Goals Patient Stated Goal: return home OT Goal Formulation: With patient Time For Goal Achievement: 12/06/20 Potential to Achieve Goals: Good ADL Goals Pt Will Perform Grooming: with supervision;with set-up;with modified independence;standing Pt Will Perform Upper Body Bathing: with  supervision;with set-up;with modified independence;standing Pt Will Perform Lower Body Bathing: with supervision;with set-up;with modified independence;sit to/from stand Pt Will Perform Upper Body Dressing: with supervision;with modified independence;with set-up;standing Pt Will Perform Lower Body Dressing: with supervision;with set-up;with modified independence;sit to/from stand Pt Will Transfer to Toilet: with supervision;with modified independence;ambulating Pt Will Perform Toileting - Clothing Manipulation and hygiene: with supervision;with modified independence;with adaptive equipment  Plan Discharge plan remains appropriate;Frequency remains appropriate       AM-PAC OT "6 Clicks" Daily Activity     Outcome Measure   Help from another person eating meals?: None Help from another person taking care of personal grooming?: None Help from another person toileting, which includes using toliet, bedpan, or urinal?: A Little Help from another person bathing (including washing, rinsing, drying)?: A Little Help from another person to put on and taking off regular upper body clothing?: None Help from another person to put on and taking off regular lower body clothing?: A Little 6 Click Score: 21    End of Session    OT Visit Diagnosis: Unsteadiness on feet (R26.81);Other abnormalities of gait and mobility (R26.89);Pain Pain - Right/Left: Left   Activity Tolerance Patient tolerated treatment well   Patient Left in chair;with call bell/phone within reach   Nurse Communication Mobility status        Time: 0475-3391 OT Time Calculation (min): 12 min  Charges: OT General Charges $OT Visit: 1 Visit OT Treatments $Therapeutic Activity: 8-22 mins     Kurtiss Wence A Dalissa Lovin 11/25/2020, 4:55 PM

## 2020-11-25 NOTE — Progress Notes (Signed)
   11/25/20 0548  Vitals  Temp 97.9 F (36.6 C)  Temp Source Axillary  BP 119/62  MAP (mmHg) 81  BP Location Right Arm  BP Method Automatic  Pulse Rate 99  Resp 18  Level of Consciousness  Level of Consciousness Alert  MEWS COLOR  MEWS Score Color Green  Oxygen Therapy  SpO2 98 %  O2 Device Room Air  MEWS Score  MEWS Temp 0  MEWS Systolic 0  MEWS Pulse 0  MEWS RR 0  MEWS LOC 0  MEWS Score 0   Patient resting. States he's starting to feel better. Wanted to sleep. VSS. Staff will continue to monitor and notify provider for any changes.

## 2020-11-25 NOTE — Progress Notes (Signed)
Family Medicine Teaching Service Daily Progress Note Intern Pager: [phone]  Patient name: [name] Medical record number: [id number] Date of birth: Oct 15, 1945 Age: 75 y.o. Gender: male  Primary Care Provider: Pcp, No Consultants: GI, Surgery, Oncology Code Status: Full  Pt Overview and Major Events to Date:  6/22-admitted for significant anemia 6/24-colonoscopy and capsule endoscopy   Assessment and Plan: [name] is a 75 y.o. male presenting with dyspnea 2/2 GI bleed. PMH is significant for anemia, A. fib, bacteremia, MV replacement, basal cell carcinoma, COPD, dementia, pre-diabetes, DVT, HTN, thrombocytopenia, colon cancer (colostomy).   Symptomatic anemia  GI bleed  s/p sigmoid colectomy and end colostomy Hgb 8.6>7.9>9.0.  GI plans for single balloon enteroscopy on 6/28. - GI following, appreciate recs - General surgery following, appreciate recs - continue to hold VTE ppx - Follow-up enteroscopy biopsies - Plan for balloon enteroscopy per GI   Adenocarcinoma of the colon Patient has history of stage II colon cancer with concern for metastatic disease from pre-op imaging.  Repeat CT chest, abdomen, pelvis showed interval resection of large colonic mass since 10/22/2020 with decreased size of the right pelvic adenopathy in respect to lymph nodes previously seen but with the new lymph node at the right internal/external iliac bifurcation.  Small pulmonary nodules unchanged.  Signs of suspected aspiration pneumonia with basilar airspace disease and small effusions.  Scattered hepatic hypodensities likely benign. - Oncology consulted, appreciate recs   Atrial fibrillation Heart rate 99 most recently. Chronic. Stable - Continue home diltiazem - Continue metoprolol 25mg  BID - Holding home Eliquis   Thrombocytopenia Stable and chronic. Platelets 89 today. - Continue to hold VTE ppx - AM CBC  FEN/GI: NPO at midnight for GI procedure PPx: SCDs Dispo:Home with  home health  in 2-3 days. Barriers include continued medical work-up.   Subjective:  Patient reports that he still having some itching and the Benadryl did not help him much.  He thinks it is related to the soap as he has never used this before and he uses Dial at home and has no issues with it.  He has no other complaints today.  Objective: Temp:  [97.5 F (36.4 C)-98.7 F (37.1 C)] 97.9 F (36.6 C) (06/27 0548) Pulse Rate:  [80-125] 99 (06/27 0548) Resp:  [16-18] 18 (06/27 0548) BP: (103-119)/(62-78) 119/62 (06/27 0548) SpO2:  [96 %-100 %] 98 % (06/27 0548) Physical Exam: General: NAD, supine in bed in the dark, initially sleeping Cardiovascular: Regular rate, irregularly rhythm Respiratory: Breathing comfortably on room air, no increased work of breathing, clear to auscultation bilaterally Abdomen: Soft, nontender, nondistended, bowel sounds present Extremities: Moving all extremities equally and appropriately.  SCDs not in place  Laboratory: Recent Labs  Lab 11/23/20 1452 11/23/20 2256 11/24/20 0355 11/24/20 0951 11/24/20 1523 11/24/20 2135 11/25/20 0332  WBC 3.0*  --  4.1  --   --   --  4.1  HGB 7.9*   < > 8.3*   < > 8.6* 7.9* 9.0*  HCT 23.9*   < > 24.8*   < > 25.8* 23.4* 27.5*  PLT 85*  --  91*  --   --   --  89*   < > = values in this interval not displayed.   Recent Labs  Lab 11/22/20 0355 11/23/20 0349 11/24/20 0355  NA 138 135 138  K 3.6 3.5 3.8  CL 108 104 108  CO2 22 22 24   BUN 20 9 11   CREATININE 0.80 0.90 1.06  CALCIUM 7.8* 8.1* 8.2*  GLUCOSE 85 76 102*     Imaging/Diagnostic Tests: CT CHEST ABDOMEN PELVIS W CONTRAST  Result Date: 11/25/2020 CLINICAL DATA:  Colorectal cancer, staging evaluation history of GI bleed. EXAM: CT CHEST, ABDOMEN, AND PELVIS WITH CONTRAST TECHNIQUE: Multidetector CT imaging of the chest, abdomen and pelvis was performed following the standard protocol during bolus administration of intravenous contrast. CONTRAST:  148mL  OMNIPAQUE IOHEXOL 300 MG/ML  SOLN COMPARISON:  Oct 22, 2020. FINDINGS: CT CHEST FINDINGS Cardiovascular: Mitral valve replacement. Mild cardiac enlargement. No substantial pericardial effusion. Normal caliber of the thoracic aorta. Post median sternotomy for CABG. Central pulmonary vasculature is unremarkable on venous phase. Mediastinum/Nodes: Thoracic inlet structures are normal. No mediastinal adenopathy. No axillary lymphadenopathy. Lungs/Pleura: Interval development of bilateral pleural fluid collections since previous imaging from May 24 is, small to moderate on the LEFT and small on the RIGHT. Associated basilar airspace disease. Paraseptal emphysema at the lung apices. 6 mm. Unchanged. Airways are patent proximally with filling defects in LEFT lower lobe bronchi greater than RIGHT at the periphery. Small pulmonary nodule in the RIGHT lung base (image 115/5) RIGHT lower lobe at 5 mm is unchanged. Parenchymal distortion in the RIGHT upper lobe is unchanged. Musculoskeletal: See below for full musculoskeletal details. CT ABDOMEN PELVIS FINDINGS Hepatobiliary: Scattered low-density hepatic lesions most with the appearance of cysts though some too small for definitive characterization. Tiny area of hypodensity in the RIGHT hepatic lobe (image 72/3) 5 mm. Seen on coronal images on previous imaging and grossly unchanged. Other areas without change. Portal vein is patent. No pericholecystic stranding or biliary duct dilation. Pancreas: Normal, without mass, inflammation or ductal dilatation. Spleen: Spleen normal size and contour without focal lesion. Adrenals/Urinary Tract: Adrenal glands are normal. Low-attenuation lesions in the bilateral kidneys, largest compatible with simple cyst in the posterior LEFT kidney interpolar aspect. Other areas likely tiny cysts. No hydronephrosis. No perinephric stranding. Urinary bladder is smooth with respect to contour. No perivesical stranding. Stomach/Bowel: Stomach moderately  distended with ingested contents. No signs of acute small bowel process. Post partial colectomy and LEFT lower quadrant ostomy. Suture lines in the pelvis at the apex of the rectal stump. This is similar to the prior study. Vascular/Lymphatic: Aortic atherosclerosis. No aneurysmal dilation. Smooth contour of the IVC. RIGHT external iliac lymph node at the internal-external bifurcation in the RIGHT pelvis 11 mm (image 114/3). This was not present previously but there is decrease of the iliac nodes that were seen on the prior exam, now less than 1 cm size. (Image 104/3) 8 mm RIGHT common iliac lymph node previously 16 mm. Resolution of external iliac lymph node that was present previously with small posterior RIGHT external iliac lymph node less than a cm that is unchanged on image 113 of series 3 approximately 8 mm. Reproductive: Prostate unremarkable by CT. Other: No ascites. No peritoneal nodularity. No free air or focal fluid collection. Musculoskeletal: Spinal degenerative changes. No acute or destructive bone process. IMPRESSION: Interval resection of large colonic mass since Oct 22, 2020 with decreased size of RIGHT pelvic adenopathy with respect to lymph nodes that were present previously but with a new lymph node at the RIGHT internal/external iliac bifurcation as described. Small pulmonary nodules are unchanged. Signs of suspected aspiration pneumonia with basilar airspace disease and small effusions. Signs of pulmonary emphysema. Scattered hepatic hypodensities likely benign, suggest attention on follow-up. Aortic Atherosclerosis (ICD10-I70.0) and Emphysema (ICD10-J43.9). Electronically Signed   By: Zetta Bills M.D.   On: 11/25/2020  08:54     Rise Patience, DO 11/25/2020, 9:50 AM PGY-1, [place] [facility] Intern pager: [phone], text pages welcome

## 2020-11-26 ENCOUNTER — Inpatient Hospital Stay (HOSPITAL_COMMUNITY): Payer: Medicare Other | Admitting: Anesthesiology

## 2020-11-26 ENCOUNTER — Encounter (HOSPITAL_COMMUNITY)
Admission: EM | Disposition: A | Discharge status: Home Health | Payer: Self-pay | Source: Home / Self Care | Attending: Family Medicine

## 2020-11-26 ENCOUNTER — Encounter (HOSPITAL_COMMUNITY): Payer: Self-pay | Admitting: Family Medicine

## 2020-11-26 DIAGNOSIS — K31819 Angiodysplasia of stomach and duodenum without bleeding: Secondary | ICD-10-CM

## 2020-11-26 DIAGNOSIS — K5520 Angiodysplasia of colon without hemorrhage: Secondary | ICD-10-CM

## 2020-11-26 DIAGNOSIS — K2970 Gastritis, unspecified, without bleeding: Secondary | ICD-10-CM

## 2020-11-26 HISTORY — PX: BALLOON ENTEROSCOPY: SHX6863

## 2020-11-26 HISTORY — PX: SUBMUCOSAL TATTOO INJECTION: SHX6856

## 2020-11-26 HISTORY — PX: HOT HEMOSTASIS: SHX5433

## 2020-11-26 LAB — BASIC METABOLIC PANEL
Anion gap: 8 (ref 5–15)
BUN: 17 mg/dL (ref 8–23)
CO2: 27 mmol/L (ref 22–32)
Calcium: 8.2 mg/dL — ABNORMAL LOW (ref 8.9–10.3)
Chloride: 103 mmol/L (ref 98–111)
Creatinine, Ser: 1.25 mg/dL — ABNORMAL HIGH (ref 0.61–1.24)
GFR, Estimated: 60 mL/min — ABNORMAL LOW (ref >60)
Glucose, Bld: 105 mg/dL — ABNORMAL HIGH (ref 70–99)
Potassium: 3.7 mmol/L (ref 3.5–5.1)
Sodium: 138 mmol/L (ref 135–145)

## 2020-11-26 LAB — CBC
HCT: 26.3 % — ABNORMAL LOW (ref 39.0–52.0)
Hemoglobin: 8.6 g/dL — ABNORMAL LOW (ref 13.0–17.0)
MCH: 29.6 pg (ref 26.0–34.0)
MCHC: 32.7 g/dL (ref 30.0–36.0)
MCV: 90.4 fL (ref 80.0–100.0)
Platelets: 84 10*3/uL — ABNORMAL LOW (ref 150–400)
RBC: 2.91 MIL/uL — ABNORMAL LOW (ref 4.22–5.81)
RDW: 17 % — ABNORMAL HIGH (ref 11.5–15.5)
WBC: 5 10*3/uL (ref 4.0–10.5)
nRBC: 0.4 % — ABNORMAL HIGH (ref 0.0–0.2)

## 2020-11-26 LAB — HEMOGLOBIN AND HEMATOCRIT, BLOOD
HCT: 26.3 % — ABNORMAL LOW (ref 39.0–52.0)
HCT: 26.2 % — ABNORMAL LOW (ref 39.0–52.0)
Hemoglobin: 8.6 g/dL — ABNORMAL LOW (ref 13.0–17.0)
Hemoglobin: 8.5 g/dL — ABNORMAL LOW (ref 13.0–17.0)

## 2020-11-26 LAB — GLUCOSE, CAPILLARY: Glucose-Capillary: 89 mg/dL (ref 70–99)

## 2020-11-26 SURGERY — ENTEROSCOPY, USING BALLOON
Anesthesia: Monitor Anesthesia Care

## 2020-11-26 MED ORDER — SPOT INK MARKER SYRINGE KIT
PACK | SUBMUCOSAL | Status: DC | PRN
  Administered 2020-11-26: 2 mL via SUBMUCOSAL

## 2020-11-26 MED ORDER — GLUCAGON HCL RDNA (DIAGNOSTIC) 1 MG IJ SOLR
INTRAMUSCULAR | Status: AC
  Filled 2020-11-26: qty 1

## 2020-11-26 MED ORDER — PHENYLEPHRINE HCL-NACL 10-0.9 MG/250ML-% IV SOLN
INTRAVENOUS | Status: DC | PRN
  Administered 2020-11-26: 40 ug/min via INTRAVENOUS

## 2020-11-26 MED ORDER — SPOT INK MARKER SYRINGE KIT
PACK | SUBMUCOSAL | Status: AC
  Filled 2020-11-26: qty 5

## 2020-11-26 MED ORDER — LIDOCAINE 2% (20 MG/ML) 5 ML SYRINGE
INTRAMUSCULAR | Status: DC | PRN
  Administered 2020-11-26: 60 mg via INTRAVENOUS

## 2020-11-26 MED ORDER — PROPOFOL 500 MG/50ML IV EMUL
INTRAVENOUS | Status: DC | PRN
  Administered 2020-11-26: 75 ug/kg/min via INTRAVENOUS

## 2020-11-26 MED ORDER — LACTATED RINGERS IV SOLN
INTRAVENOUS | Status: AC | PRN
  Administered 2020-11-26: 10 mL/h via INTRAVENOUS

## 2020-11-26 MED ORDER — GLUCAGON HCL RDNA (DIAGNOSTIC) 1 MG IJ SOLR
INTRAMUSCULAR | Status: DC | PRN
  Administered 2020-11-26: .5 mg via INTRAVENOUS

## 2020-11-26 MED ORDER — SODIUM CHLORIDE 0.9 % IV SOLN: INTRAVENOUS | Status: DC

## 2020-11-26 NOTE — Progress Notes (Signed)
Paged MD about pt right AC IV. It was itchy, red, and a fluid pocket at his elbow. Removed infiltrated IV. The cannula had purulent fluid in it, and there was a long string of purulent fluid on the cannula as I removed it.  ID was on the unit right after I removed it, so I let them know as well.

## 2020-11-26 NOTE — Progress Notes (Signed)
Paged MD about pt BP 91/51. Wait 30 minutes, check a manual BP, and page them again.

## 2020-11-26 NOTE — Progress Notes (Signed)
Called son, Gaspar Bidding. He is on his way here and he will talk to me when he gets here.

## 2020-11-26 NOTE — Transfer of Care (Signed)
Immediate Anesthesia Transfer of Care Note  Patient: [name]  Procedure(s) Performed: BALLOON ENTEROSCOPY HOT HEMOSTASIS (ARGON PLASMA COAGULATION/BICAP)  Patient Location: Endoscopy Unit  Anesthesia Type:MAC  Level of Consciousness: drowsy  Airway & Oxygen Therapy: Patient Spontanous Breathing and Patient connected to nasal cannula oxygen  Post-op Assessment: Report given to RN, Post -op Vital signs reviewed and stable and Patient moving all extremities  Post vital signs: Reviewed and stable  Last Vitals:  Vitals Value Taken Time  BP 99/55 11/26/20 1325  Temp    Pulse 55 11/26/20 1325  Resp 20 11/26/20 1325  SpO2 100 % 11/26/20 1325  Vitals shown include unvalidated device data.  Last Pain:  Vitals:   11/26/20 1127  TempSrc: Oral  PainSc: 0-No pain      Patients Stated Pain Goal: 0 (58/52/77 8242)  Complications: No notable events documented.

## 2020-11-26 NOTE — Anesthesia Preprocedure Evaluation (Signed)
Anesthesia Evaluation  Patient identified by MRN, date of birth, ID band Patient awake    Reviewed: Allergy & Precautions, NPO status , Patient's Chart, lab work & pertinent test results, reviewed documented beta blocker date and time   Airway Mallampati: II  TM Distance: >3 FB Neck ROM: Full   Comment: Thick beard Dental  (+) Missing, Chipped, Poor Dentition,    Pulmonary COPD,  COPD inhaler, Current Smoker and Patient abstained from smoking.,    Pulmonary exam normal        Cardiovascular hypertension, Pt. on medications and Pt. on home beta blockers +CHF and + DVT  Normal cardiovascular exam+ dysrhythmias (eliquis) Atrial Fibrillation + Valvular Problems/Murmurs (MVR 2018)      Neuro/Psych PSYCHIATRIC DISORDERS Dementia negative neurological ROS     GI/Hepatic Neg liver ROS, GIB S/P sigmoid colectomy with end colostomy 10/24/20    Endo/Other  diabetes  Renal/GU ARFRenal disease  negative genitourinary   Musculoskeletal negative musculoskeletal ROS (+)   Abdominal   Peds  Hematology  (+) Blood dyscrasia, anemia , Hgb 8.6, Plts 84   Anesthesia Other Findings  Echo 09/2020: 1. Left ventricular ejection fraction, by estimation, is 65 to 70%. The  left ventricle has normal function. The left ventricle has no regional  wall motion abnormalities. Left ventricular diastolic function could not  be evaluated.  2. Right ventricular systolic function is normal. The right ventricular  size is mildly enlarged. There is normal pulmonary artery systolic  pressure. The estimated right ventricular systolic pressure is 24.5 mmHg.  3. 31 mm mosaic prosthetic valve with MG 7 mmHG @ 130 bpm. EOA 2.32 cm2.  DI 1.6. Normal prosthesis with higher gradients due to tachycardia. The  mitral valve has been repaired/replaced. No evidence of mitral valve  regurgitation. There is a 31 mm  [employer]onic Mosaic bioprosthetic valve present in  the mitral position.  Procedure Date: 07/14/2016.  4. The aortic valve is tricuspid. Aortic valve regurgitation is not  visualized. No aortic stenosis is present.  5. The inferior vena cava is normal in size with greater than 50%  respiratory variability, suggesting right atrial pressure of 3 mmHg.    Reproductive/Obstetrics negative OB ROS                            Anesthesia Physical  Anesthesia Plan  ASA: 4  Anesthesia Plan: MAC   Post-op Pain Management:    Induction:   PONV Risk Score and Plan: 1 and Propofol infusion and TIVA  Airway Management Planned: Natural Airway and Simple Face Mask  Additional Equipment: None  Intra-op Plan:   Post-operative Plan:   Informed Consent: I have reviewed the patients History and Physical, chart, labs and discussed the procedure including the risks, benefits and alternatives for the proposed anesthesia with the patient or authorized representative who has indicated his/her understanding and acceptance.       Plan Discussed with:   Anesthesia Plan Comments:        Anesthesia Quick Evaluation

## 2020-11-26 NOTE — Interval H&P Note (Signed)
History and Physical Interval Note:  11/26/2020 11:47 AM  [name]  has presented today for surgery, with the diagnosis of angiodysplasia of jejunum with hemorrhage.  The various methods of treatment have been discussed with the patient and family. After consideration of risks, benefits and other options for treatment, the patient has consented to  Procedure(s) with comments: SINGLE BALLOON ENTEROSCOPY (N/A) - as a surgical intervention.  The patient's history has been reviewed, patient examined, no change in status, stable for surgery.  I have reviewed the patient's chart and labs.  Questions were answered to the patient's satisfaction.     Dominic Pea Ricco Dershem

## 2020-11-26 NOTE — Progress Notes (Signed)
Pt tolerated full liquids well.  He was angry that he couldn't have a grilled cheese sandwich.  Hulan Amato, RN changed dressing and ostomy.  Pt son checked in and wanted to know when pt would be d/c.

## 2020-11-26 NOTE — Anesthesia Procedure Notes (Signed)
Procedure Name: MAC Date/Time: 11/26/2020 12:19 PM Performed by: Kyung Rudd, CRNA Pre-anesthesia Checklist: Patient identified, Emergency Drugs available, Suction available and Patient being monitored Patient Re-evaluated:Patient Re-evaluated prior to induction Oxygen Delivery Method: Nasal cannula Induction Type: IV induction Placement Confirmation: positive ETCO2 Dental Injury: Teeth and Oropharynx as per pre-operative assessment

## 2020-11-26 NOTE — Progress Notes (Deleted)
Cardiology Clinic Note   Patient Name: [name] Date of Encounter: 11/26/2020  Primary Care Provider:  Pcp, No Primary Cardiologist:  Donato Heinz, MD  Patient Profile    [name] 75 year old male presents to the clinic today for follow-up evaluation of his atrial flutter and coronary artery disease.  Past Medical History    Past Medical History:  Diagnosis Date   Anemia    Atrial fibrillation ([place])    post operative in 2018   Atrial flutter ([place])    Bacteremia 2018   Basal cell carcinoma    COPD (chronic obstructive pulmonary disease) (HCC)    Dementia (HCC)    Diabetes (HCC)    DVT of deep femoral vein (HCC)    Hypertension    Thrombocytopenia ([place])    Past Surgical History:  Procedure Laterality Date   BIOPSY  10/21/2020   Procedure: BIOPSY;  Surgeon: Doran Stabler, MD;  Location: [place];  Service: Gastroenterology;;   BIOPSY  11/21/2020   Procedure: BIOPSY;  Surgeon: Gatha Mayer, MD;  Location: [place];  Service: Endoscopy;;   COLONOSCOPY WITH PROPOFOL N/A 11/22/2020   Procedure: COLONOSCOPY WITH PROPOFOL;  Surgeon: Gatha Mayer, MD;  Location: [facility] ENDOSCOPY;  Service: Endoscopy;  Laterality: N/A;   CYSTOSCOPY W/ URETERAL STENT PLACEMENT N/A 10/24/2020   Procedure: CYSTOSCOPY WITH RETROGRADE PYELOGRAM/URETERAL STENT PLACEMENT;  Surgeon: Ardis Hughs, MD;  Location: [place];  Service: Urology;  Laterality: N/A;   CYSTOSCOPY WITH URETHRAL DILATATION  10/24/2020   Procedure: URETHRAL DILATATION;  Surgeon: Ardis Hughs, MD;  Location: [place];  Service: Urology;;   ENTEROSCOPY N/A 11/21/2020   Procedure: ENTEROSCOPY;  Surgeon: Gatha Mayer, MD;  Location: [place];  Service: Endoscopy;  Laterality: N/A;   ESOPHAGOGASTRODUODENOSCOPY (EGD) WITH PROPOFOL N/A 10/21/2020   Procedure: ESOPHAGOGASTRODUODENOSCOPY (EGD) WITH PROPOFOL;  Surgeon: Doran Stabler, MD;  Location: [place];  Service: Gastroenterology;   Laterality: N/A;   GIVENS CAPSULE STUDY N/A 11/22/2020   Procedure: GIVENS CAPSULE STUDY;  Surgeon: Gatha Mayer, MD;  Location: [place];  Service: Endoscopy;  Laterality: N/A;   MITRAL VALVE REPLACEMENT  07/2016   [facility] for endocarditis   PARTIAL COLECTOMY N/A 10/24/2020   Procedure: OPEN PARTIAL COLECTOMY WTH COLOSTOMY;  Surgeon: Rolm Bookbinder, MD;  Location: [place];  Service: General;  Laterality: N/A;   SIGMOIDOSCOPY  10/21/2020   Procedure: SIGMOIDOSCOPY;  Surgeon: Doran Stabler, MD;  Location: [place];  Service: Gastroenterology;;   SUBMUCOSAL INJECTION  10/21/2020   Procedure: SUBMUCOSAL INJECTION;  Surgeon: Doran Stabler, MD;  Location: [place];  Service: Gastroenterology;;  SPOT    Allergies  Allergies  Allergen Reactions   Orange Juice [Orange Oil] Hives    History of Present Illness    [name] has a PMH of atrial flutter with variable AV block, endocarditis requiring mitral valve replacement at [facility], HTN, diabetes, and dementia.  He was admitted to the hospital 10/18/2020 from the emergency department.  He initially presented to the hospital for outpatient echocardiogram and during that time he was noted to be in atrial flutter with RVR.  He underwent EGD 10/21/2020 and received partial colectomy with colostomy 10/24/2020.  He was discharged in stable condition on 11/01/2020.  Cardiology was consulted and he was found to be in atrial flutter with variable AV block which was well rate controlled.  DCCV was recommended after a minimum of 3-4 weeks  of uninterrupted anticoagulation.  It was felt that his anticoagulation could be discontinued after at least 1 month of therapy post DCCV.  He presents the clinic today for follow-up evaluation states***  *** denies chest pain, shortness of breath, lower extremity edema, fatigue, palpitations, melena, hematuria, hemoptysis, diaphoresis, weakness, presyncope, syncope, orthopnea, and  PND.   Home Medications    Prior to Admission medications   Medication Sig Start Date End Date Taking? Authorizing Provider  acetaminophen (TYLENOL) 325 MG tablet Take 2 tablets (650 mg total) by mouth every 6 (six) hours as needed for mild pain or fever. 11/01/20   Norm Parcel, PA-C  albuterol (VENTOLIN HFA) 108 (90 Base) MCG/ACT inhaler Inhale 1-2 puffs into the lungs every 6 (six) hours as needed for wheezing or shortness of breath. 11/01/20   Hongalgi, Lenis Dickinson, MD  apixaban (ELIQUIS) 5 MG TABS tablet Take 1 tablet (5 mg total) by mouth 2 (two) times daily. 11/01/20   Hongalgi, Lenis Dickinson, MD  diltiazem (CARDIZEM CD) 120 MG 24 hr capsule Take 1 capsule (120 mg total) by mouth at bedtime. 11/01/20   Hongalgi, Lenis Dickinson, MD  Iron Polysacch Cmplx-B12-FA 150-0.025-1 MG CAPS Take 1 capsule by mouth daily. 11/02/20   Hongalgi, Lenis Dickinson, MD  methocarbamol (ROBAXIN) 500 MG tablet Take 1 tablet (500 mg total) by mouth every 8 (eight) hours as needed for muscle spasms. 11/01/20   Norm Parcel, PA-C  metoprolol tartrate (LOPRESSOR) 50 MG tablet Take 1 tablet (50 mg total) by mouth 2 (two) times daily. 11/01/20   Hongalgi, Lenis Dickinson, MD  Multiple Vitamin (MULTI-VITAMIN DAILY) TABS Take 1 tablet by mouth daily.    [provider]  nicotine (NICODERM CQ - DOSED IN MG/24 HOURS) 14 mg/24hr patch Place 1 patch (14 mg total) onto the skin daily. 11/02/20   Hongalgi, Lenis Dickinson, MD  ondansetron (ZOFRAN) 4 MG tablet Take 1 tablet (4 mg total) by mouth daily as needed for nausea or vomiting. 11/01/20 11/01/21  Norm Parcel, PA-C  oxyCODONE (OXY IR/ROXICODONE) 5 MG immediate release tablet Take 0.5-1 tablets (2.5-5 mg total) by mouth every 6 (six) hours as needed for moderate pain or severe pain. 11/01/20   Norm Parcel, PA-C  polyethylene glycol powder (GLYCOLAX/MIRALAX) 17 GM/SCOOP powder Dissolve 2 capfuls (34 g total) by mouth daily as needed for mild constipation. 11/01/20   Norm Parcel, PA-C  tiotropium (SPIRIVA  HANDIHALER) 18 MCG inhalation capsule Place 1 capsule (18 mcg total) into inhaler and inhale daily. 11/01/20   Modena Jansky, MD    Family History    Family History  Problem Relation Age of Onset   Hypertension Other    He indicated that the status of his other is unknown.  Social History    Social History   Socioeconomic History   Marital status: Single    Spouse name: Not on file   Number of children: Not on file   Years of education: Not on file   Highest education level: Not on file  Occupational History   Not on file  Tobacco Use   Smoking status: Every Day    Years: 62.00    Pack years: 0.00    Types: Cigarettes, Pipe   Smokeless tobacco: Never  Vaping Use   Vaping Use: Never used  Substance and Sexual Activity   Alcohol use: Not Currently    Comment: occasionally drinks beer 09/03/20   Drug use: Not Currently    Comment: previous marijuana  use   Sexual activity: Not Currently  Other Topics Concern   Not on file  Social History Narrative   moved from [place] to [place] in 2019 to live with his son    Social Determinants of Radio broadcast assistant Strain: Not on [employer] Insecurity: Not on file  Transportation Needs: Unmet Transportation Needs   Lack of Transportation (Medical): Yes   Lack of Transportation (Non-Medical): Yes  Physical Activity: Not on file  Stress: Not on file  Social Connections: Not on file  Intimate Partner Violence: Not on file     Review of Systems    General:  No chills, fever, night sweats or weight changes.  Cardiovascular:  No chest pain, dyspnea on exertion, edema, orthopnea, palpitations, paroxysmal nocturnal dyspnea. Dermatological: No rash, lesions/masses Respiratory: No cough, dyspnea Urologic: No hematuria, dysuria Abdominal:   No nausea, vomiting, diarrhea, bright red blood per rectum, melena, or hematemesis Neurologic:  No visual changes, wkns, changes in mental status. All other systems reviewed and  are otherwise negative except as noted above.  Physical Exam    VS:  There were no vitals taken for this visit. , BMI There is no height or weight on file to calculate BMI. GEN: Well nourished, well developed, in no acute distress. HEENT: normal. Neck: Supple, no JVD, carotid bruits, or masses. Cardiac: RRR, no murmurs, rubs, or gallops. No clubbing, cyanosis, edema.  Radials/DP/PT 2+ and equal bilaterally.  Respiratory:  Respirations regular and unlabored, clear to auscultation bilaterally. GI: Soft, nontender, nondistended, BS + x 4. MS: no deformity or atrophy. Skin: warm and dry, no rash. Neuro:  Strength and sensation are intact. Psych: Normal affect.  Accessory Clinical Findings    Recent Labs: 10/22/2020: TSH 1.445 10/23/2020: ALT 19 11/20/2020: B Natriuretic Peptide 232.1 11/24/2020: Magnesium 1.8 11/26/2020: BUN 17; Creatinine, Ser 1.25; Hemoglobin 8.6; Platelets 84; Potassium 3.7; Sodium 138   Recent Lipid Panel    Component Value Date/Time   CHOL 153 01/29/2020 1548   TRIG 82 01/29/2020 1548   HDL 60 01/29/2020 1548   CHOLHDL 2.6 01/29/2020 1548   LDLCALC 77 01/29/2020 1548    ECG personally reviewed by me today- *** - No acute changes  Echocardiogram 10/19/2020 IMPRESSIONS     1. Left ventricular ejection fraction, by estimation, is 65 to 70%. The  left ventricle has normal function. The left ventricle has no regional  wall motion abnormalities. Left ventricular diastolic function could not  be evaluated.   2. Right ventricular systolic function is normal. The right ventricular  size is mildly enlarged. There is normal pulmonary artery systolic  pressure. The estimated right ventricular systolic pressure is 15.1 mmHg.   3. 31 mm mosaic prosthetic valve with MG 7 mmHG @ 130 bpm. EOA 2.32 cm2.  DI 1.6. Normal prosthesis with higher gradients due to tachycardia. The  mitral valve has been repaired/replaced. No evidence of mitral valve  regurgitation. There is a  31 mm  Medtronic Mosaic bioprosthetic valve present in the mitral position.  Procedure Date: 07/14/2016.   4. The aortic valve is tricuspid. Aortic valve regurgitation is not  visualized. No aortic stenosis is present.   5. The inferior vena cava is normal in size with greater than 50%  respiratory variability, suggesting right atrial pressure of 3 mmHg.   Assessment & Plan   1.  Atypical atrial flutter-EKG today shows***.  Remains cardiac unaware.  Denies recent episodes of accelerated or irregular heartbeat.  Started on apixaban  while admitted with plan to cardiovert as outpatient. Continue*** Heart healthy low-sodium diet-salty 6 given Increase physical activity as tolerated Proceed to DCCV  Symptomatic anemia/thrombocytopenia-hemoglobin 7.9 on 11/27/2020.  Received 4 units of PRBCs.  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  Balloon enteroscopy was performed on 6/28.  Disposition: Follow-up with Dr. Gardiner Rhyme after DCCV.  Jossie Ng. [place] Cloe NP-C    11/26/2020, 1:53 PM [facility] [place] [address] Office [phone] Fax [phone]  Notice: This dictation was prepared with Dragon dictation along with smaller phrase technology. Any transcriptional errors that result from this process are unintentional and may not be corrected upon review.  I spent***minutes examining this patient, reviewing medications, and using patient centered shared decision making involving her cardiac care.  Prior to her visit I spent greater than 20 minutes reviewing her past medical history,  medications, and prior cardiac tests.

## 2020-11-26 NOTE — Care Management Important Message (Signed)
Important Message  Patient Details  Name: [name] MRN: [id number] Date of Birth: Jun 13, 1945   Medicare Important Message Given:  Yes     [name] 11/26/2020, 3:00 PM

## 2020-11-26 NOTE — Op Note (Signed)
[facility] Patient Name: [name] Procedure Date : 11/26/2020 MRN: [id number] Attending MD: Gerrit Heck , MD Date of Birth: [birth year] CSN: [id number] Age: 75 Admit Type: Inpatient Procedure:                Small bowel enteroscopy Indications:              Iron deficiency anemia, GI bleeding source not                            documented by previous UGI endoscopy, GI bleeding                            source not documented by previous colonoscopy, GI                            bleeding source not documented by previous                            enteroscopy, Arteriovenous malformation in the                            small intestine Providers:                Gerrit Heck, MD, Doristine Johns, RN, Tyrone Apple, Technician, Benetta Spar, Technician,                            Rhae Lerner, CRNA Referring MD:              Medicines:                Monitored Anesthesia Care Complications:            No immediate complications. Estimated Blood Loss:     Estimated blood loss was minimal. Procedure:                Pre-Anesthesia Assessment:                           - Prior to the procedure, a History and Physical                            was performed, and patient medications and                            allergies were reviewed. The patient's tolerance of                            previous anesthesia was also reviewed. The risks                            and benefits of the procedure and the sedation                            options and risks were discussed  with the patient.                            All questions were answered, and informed consent                            was obtained. Prior Anticoagulants: The patient has                            taken no previous anticoagulant or antiplatelet                            agents. ASA Grade Assessment: III - A patient with                            severe  systemic disease. After reviewing the risks                            and benefits, the patient was deemed in                            satisfactory condition to undergo the procedure.                           After obtaining informed consent, the endoscope was                            passed under direct vision. Throughout the                            procedure, the patient's blood pressure, pulse, and                            oxygen saturations were monitored continuously. The                            [id number]-Q180 ([id number]) Olympus enteroscope was                            introduced through the mouth and advanced to the                            distal jejunum. The small bowel enteroscopy was                            accomplished without difficulty. There was looping                            in the stomach which was mitigated with gentle                            abdominal pressure and reduction of the enteroscope  and advancement of the overtube directly into the                            small bowel. The patient tolerated the procedure                            well. Scope In: Scope Out: Findings:      The examined esophagus was normal.      Localized mild inflammation characterized by erythema was found in the       gastric antrum.      There was no evidence of significant pathology in the duodenal bulb, in       the first portion of the duodenum, in the second portion of the       duodenum, in the third portion of the duodenum and in the fourth portion       of the duodenum.      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       Coagulation for hemostasis using argon plasma was successful. Estimated       blood loss: none.      Normal mucosa was otherwise found in the remainder of the jejunum. Area       was tattooed with an injection of 2 mL of Spot (carbon black) to mark       the distal extent reached, which  was approximately 150 cm beyond the       pylorus. Impression:               - Normal esophagus.                           - Gastritis.                           - Normal duodenal bulb, first portion of the                            duodenum, second portion of the duodenum, third                            portion of the duodenum and fourth portion of the                            duodenum.                           - A single non-bleeding angioectasia in the                            jejunum. Treated with argon plasma coagulation                            (APC).                           - Normal mucosa was found in the remainder of the  jejunum. Tattoo placed at the distal extent reached.                           - No specimens collected. Recommendation:           - Return patient to hospital ward for observation                            with possible discharge tomorrow AM.                           - Advance diet as tolerated.                           - Continue present medications.                           - Ok to resume anticoagulation tomorrow.                           - Continue to monitor Hgb/Hct while inpatient.                           - If suspicion for acute rebleeding, can consider                            repeat VCE. If active bleeding noted distal to the                            tattoo, would not be amenable to repeat single                            balloon enteroscopy. Procedure Code(s):        --- Professional ---                           [phone], Small intestinal endoscopy, enteroscopy                            beyond second portion of duodenum, not including                            ileum; with control of bleeding (eg, injection,                            bipolar cautery, unipolar cautery, laser, heater                            probe, stapler, plasma coagulator)                           44799, Unlisted procedure, small  intestine Diagnosis Code(s):        --- Professional ---                           K29.70, Gastritis, unspecified, without bleeding  K55.20, Angiodysplasia of colon without hemorrhage                           D50.9, Iron deficiency anemia, unspecified                           K92.2, Gastrointestinal hemorrhage, unspecified                           K31.819, Angiodysplasia of stomach and duodenum                            without bleeding CPT copyright 2019 American Medical Association. All rights reserved. The codes documented in this report are preliminary and upon coder review may  be revised to meet current compliance requirements. Gerrit Heck, MD 11/26/2020 1:34:06 PM Number of Addenda: 0

## 2020-11-26 NOTE — Progress Notes (Signed)
HEMATOLOGY-ONCOLOGY PROGRESS NOTE  SUBJECTIVE: No bleeding, he continues to have pruritus.  He relates the truncal erythema and pruritus to a soap used in the hospital.  PHYSICAL EXAMINATION:  Vitals:   11/26/20 1408 11/26/20 1500  BP: (!) 91/51 (!) 98/52  Pulse: (!) 41 60  Resp: 19   Temp: 98.1 F (36.7 C)   SpO2: 99%    Filed Weights   11/20/20 1737 11/21/20 1000 11/26/20 1127  Weight: 144 lb (65.3 kg) 143 lb 15.4 oz (65.3 kg) 145 lb (65.8 kg)    Intake/Output from previous day: 06/27 0701 - 06/28 0700 In: 120 [P.O.:120] Out: 400 [Urine:300; Stool:100]  GENERAL: Chronically ill-appearing male, no distress SKIN: Ulcerated lesion left hand with irregular border, mild confluent erythema over the trunk  ABDOMEN: soft, left ostomy with brown stool, gauze dressing at the midline wound-the wound is open superficially NEURO: alert & oriented   LABORATORY DATA:  I have reviewed the data as listed CMP Latest Ref Rng & Units 11/26/2020 11/24/2020 11/23/2020  Glucose 70 - 99 mg/dL 105(H) 102(H) 76  BUN 8 - 23 mg/dL 17 11 9   Creatinine 0.61 - 1.24 mg/dL 1.25(H) 1.06 0.90  Sodium 135 - 145 mmol/L 138 138 135  Potassium 3.5 - 5.1 mmol/L 3.7 3.8 3.5  Chloride 98 - 111 mmol/L 103 108 104  CO2 22 - 32 mmol/L 27 24 22   Calcium 8.9 - 10.3 mg/dL 8.2(L) 8.2(L) 8.1(L)  Total Protein 6.5 - 8.1 g/dL - - -  Total Bilirubin 0.3 - 1.2 mg/dL - - -  Alkaline Phos 38 - 126 U/L - - -  AST 15 - 41 U/L - - -  ALT 0 - 44 U/L - - -    Lab Results  Component Value Date   WBC 5.0 11/26/2020   HGB 8.6 (L) 11/26/2020   HCT 26.3 (L) 11/26/2020   MCV 90.4 11/26/2020   PLT 84 (L) 11/26/2020   NEUTROABS 3.2 11/20/2020    CT CHEST ABDOMEN PELVIS W CONTRAST  Result Date: 11/25/2020 CLINICAL DATA:  Colorectal cancer, staging evaluation history of GI bleed. EXAM: CT CHEST, ABDOMEN, AND PELVIS WITH CONTRAST TECHNIQUE: Multidetector CT imaging of the chest, abdomen and pelvis was performed following the  standard protocol during bolus administration of intravenous contrast. CONTRAST:  150mL OMNIPAQUE IOHEXOL 300 MG/ML  SOLN COMPARISON:  Oct 22, 2020. FINDINGS: CT CHEST FINDINGS Cardiovascular: Mitral valve replacement. Mild cardiac enlargement. No substantial pericardial effusion. Normal caliber of the thoracic aorta. Post median sternotomy for CABG. Central pulmonary vasculature is unremarkable on venous phase. Mediastinum/Nodes: Thoracic inlet structures are normal. No mediastinal adenopathy. No axillary lymphadenopathy. Lungs/Pleura: Interval development of bilateral pleural fluid collections since previous imaging from May 24 is, small to moderate on the LEFT and small on the RIGHT. Associated basilar airspace disease. Paraseptal emphysema at the lung apices. 6 mm. Unchanged. Airways are patent proximally with filling defects in LEFT lower lobe bronchi greater than RIGHT at the periphery. Small pulmonary nodule in the RIGHT lung base (image 115/5) RIGHT lower lobe at 5 mm is unchanged. Parenchymal distortion in the RIGHT upper lobe is unchanged. Musculoskeletal: See below for full musculoskeletal details. CT ABDOMEN PELVIS FINDINGS Hepatobiliary: Scattered low-density hepatic lesions most with the appearance of cysts though some too small for definitive characterization. Tiny area of hypodensity in the RIGHT hepatic lobe (image 72/3) 5 mm. Seen on coronal images on previous imaging and grossly unchanged. Other areas without change. Portal vein is patent. No pericholecystic stranding or  biliary duct dilation. Pancreas: Normal, without mass, inflammation or ductal dilatation. Spleen: Spleen normal size and contour without focal lesion. Adrenals/Urinary Tract: Adrenal glands are normal. Low-attenuation lesions in the bilateral kidneys, largest compatible with simple cyst in the posterior LEFT kidney interpolar aspect. Other areas likely tiny cysts. No hydronephrosis. No perinephric stranding. Urinary bladder is  smooth with respect to contour. No perivesical stranding. Stomach/Bowel: Stomach moderately distended with ingested contents. No signs of acute small bowel process. Post partial colectomy and LEFT lower quadrant ostomy. Suture lines in the pelvis at the apex of the rectal stump. This is similar to the prior study. Vascular/Lymphatic: Aortic atherosclerosis. No aneurysmal dilation. Smooth contour of the IVC. RIGHT external iliac lymph node at the internal-external bifurcation in the RIGHT pelvis 11 mm (image 114/3). This was not present previously but there is decrease of the iliac nodes that were seen on the prior exam, now less than 1 cm size. (Image 104/3) 8 mm RIGHT common iliac lymph node previously 16 mm. Resolution of external iliac lymph node that was present previously with small posterior RIGHT external iliac lymph node less than a cm that is unchanged on image 113 of series 3 approximately 8 mm. Reproductive: Prostate unremarkable by CT. Other: No ascites. No peritoneal nodularity. No free air or focal fluid collection. Musculoskeletal: Spinal degenerative changes. No acute or destructive bone process. IMPRESSION: Interval resection of large colonic mass since Oct 22, 2020 with decreased size of RIGHT pelvic adenopathy with respect to lymph nodes that were present previously but with a new lymph node at the RIGHT internal/external iliac bifurcation as described. Small pulmonary nodules are unchanged. Signs of suspected aspiration pneumonia with basilar airspace disease and small effusions. Signs of pulmonary emphysema. Scattered hepatic hypodensities likely benign, suggest attention on follow-up. Aortic Atherosclerosis (ICD10-I70.0) and Emphysema (ICD10-J43.9). Electronically Signed   By: Zetta Bills M.D.   On: 11/25/2020 08:54   DG Chest Port 1 View  Result Date: 11/20/2020 CLINICAL DATA:  Shortness of breath, hypotension EXAM: PORTABLE CHEST 1 VIEW COMPARISON:  10/24/2020 FINDINGS: Cardiomegaly  status post median sternotomy. Both lungs are clear. The visualized skeletal structures are unremarkable. IMPRESSION: Cardiomegaly without acute abnormality of the lungs in AP portable projection. Electronically Signed   By: Eddie Candle M.D.   On: 11/20/2020 14:10   DG Abd Portable 1V  Result Date: 10/29/2020 CLINICAL DATA:  Abdominal pain and distension following abdominal surgery EXAM: PORTABLE ABDOMEN - 1 VIEW COMPARISON:  Portable exam 1030 hours compared to CT abdomen and pelvis 10/22/2020 FINDINGS: Ostomy RIGHT lower quadrant. Surgical drain in pelvis. Numerous loops of air-filled mildly distended small bowel. Increased stool in RIGHT colon. Findings most likely represent postoperative ileus. No acute osseous findings or urinary tract calcification. IMPRESSION: Probable postoperative ileus. Electronically Signed   By: Lavonia Dana M.D.   On: 10/29/2020 10:43    ASSESSMENT AND PLAN: 1.  Sigmoid colon adenocarcinoma -Colonoscopy 10/21/2020- partially obstructing mass found in the sigmoid colon consistent with adenocarcinoma. -CEA on 10/22/2020 was 3.9 -CTs 10/22/2020-colonic mass with pelvic lymphadenopathy including right common iliac and external iliac nodes, small pulmonary nodules -10/24/2020-sigmoid colectomy, end colostomy, tumor stuck to pelvic sidewall with rind of tissue remaining at completion of surgery -Pathology- moderately differentiated adenocarcinoma the sigmoid colon, tumor extends into pericolonic connective tissue, no lymphovascular perineural invasion, 0/14 lymph nodes, carcinoma involves an area with the specimen was disrupted at mesenteric margin. -CT chest/abdomen/pelvis with contrast 11/24/20 -interval resection of the large colonic mass, decrease in size of  right pelvic adenopathy, new node at the right internal/external iliac bifurcation, small pulmonary nodules are unchanged, scattered hepatic hypodensities likely benign. 2.  Iron deficiency anemia secondary to underlying GI  malignancy. 3.  Thrombocytopenia 4.  Atrial flutter 5.  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  COPD 7.  Hypertension 8.  Diabetes mellitus 9.  Dementia 10.  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 Martin Majestic who is going to check with some Mohs specialist to see if disease is resectable, they were holding on radiation appointments until this could be explored. -Established care with the wound center on 09/13/2019 12.  Hospital admission 11/20/2020-GI bleed  [name] has been admitted for GI bleed.  He has received 4 units PRBC so far.  His Eliquis has been discontinued.  The patient was noted to have probable AVM with active oozing in the mid small bowel and another small AVM more distally on capsule endoscopy.  Plan is for balloon enteroscopy 6/28.  His hemoglobin remains stable.  He also has chronic thrombocytopenia and platelet count overall remains stable.  The patient has stage II colon cancer with mesenteric margin and question of pelvic lymph nodes and lung nodules noted on initial CT scans.  Repeat CT scans performed earlier today showed a decrease in size of pelvic adenopathy with stable pulmonary nodules and scattered hepatic hypodensities are likely benign.  There is a new node at the right internal/external iliac bifurcation measuring 11 mm.  Recommendations: 1.  Proceed with balloon enteroscopy as scheduled 6/28 per GI. 2.  Continue to hold Eliquis. 3.  Benadryl as needed for pruritus related to the trunk rash, is the rash related to CT contrast? 4.  Outpatient follow-up at the Cancer center as scheduled 12/06/2020  Mr. Sine appears stable.  He continues to complain of pruritus.  He has persistent erythema (improved) over the trunk.  I wonder whether  the erythema and pruritus are related to the CT contrast given on 11/24/2020.  I reviewed the CT findings with [name].  There is no clear evidence of persistent/progressive colon cancer.  We discussed the indication for adjuvant systemic therapy.  I remain concerned he has a significant risk of developing recurrent colon cancer based on the questionable resection margin.  We will obtain an assay for circulating tumor DNA when he returns on 12/06/2020.  This has recently shown to be predictive of disease relapse in patients with resected colon cancer.     LOS: 5 days   Betsy Coder, DNP, AGPCNP-BC, AOCNP 11/26/20

## 2020-11-26 NOTE — Anesthesia Postprocedure Evaluation (Signed)
Anesthesia Post Note  Patient: [name]  Procedure(s) Performed: BALLOON ENTEROSCOPY HOT HEMOSTASIS (ARGON PLASMA COAGULATION/BICAP)     Patient location during evaluation: Endoscopy Anesthesia Type: MAC Level of consciousness: awake and alert Pain management: pain level controlled Vital Signs Assessment: post-procedure vital signs reviewed and stable Respiratory status: spontaneous breathing, nonlabored ventilation, respiratory function stable and patient connected to nasal cannula oxygen Cardiovascular status: stable and blood pressure returned to baseline Postop Assessment: no apparent nausea or vomiting Anesthetic complications: no   No notable events documented.  Last Vitals:  Vitals:   11/26/20 1355 11/26/20 1408  BP: (!) 96/41 (!) 91/51  Pulse: 82 (!) 41  Resp: 17 19  Temp:  36.7 C  SpO2: 100% 99%    Last Pain:  Vitals:   11/26/20 1408  TempSrc: Oral  PainSc:                  Timur Nibert COKER

## 2020-11-26 NOTE — Progress Notes (Signed)
Family Medicine Teaching Service Daily Progress Note Intern Pager: [phone]  Patient name: [name] Medical record number: [id number] Date of birth: 07/19/1945 Age: 75 y.o. Gender: male  Primary Care Provider: Pcp, No Consultants: Oncology, GI, surgery Code Status: Full  Pt Overview and Major Events to Date:  6/22-admitted for significant anemia 6/24-colonoscopy and capsule endoscopy   Assessment and Plan: [name] is a 75 y.o. male presenting with dyspnea 2/2 GI bleed. PMH is significant for anemia, A. fib, bacteremia, MV replacement, basal cell carcinoma, COPD, dementia, pre-diabetes, DVT, HTN, thrombocytopenia, colon cancer (colostomy).   Symptomatic anemia  GI bleed  s/p sigmoid colectomy and end colostomy Hgb this morning 8.6.  GI plans for single balloon enteroscopy today. - GI following, appreciate recs - General surgery following, appreciate recs - continue to hold VTE ppx - Follow-up enteroscopy biopsies  CT lung findings Lung findings on CT scan showed signs of a possible aspiration pneumonia. Patient does have a cough this morning during examination that was not noted before. Patient has had recent anesthesia with procedures that is an increased risk of aspiration. - Consider starting Augmentin - Monitor for signs of URI/pneumonia.  Adenocarcinoma of the colon Per oncology the restaging CT scan showed decrease in pelvic adenopathy and no clear evidence of metastatic disease. Will follow-up outpatient with oncology - Oncology to follow outpatient   Atrial fibrillation  Chronic. Stable. Heart rate most recently documented at 115, non-tachycardic in the room. Patient has lower blood pressures (but with good MAP), will not increase metoprolol at this time. - Continue diltiazem 120mg  QHS - Continue metoprolol 25mg  BID - Holding home Eliquis   Thrombocytopenia Stable and chronic. Platelets 84 today. - Continue to hold VTE ppx - AM CBC   FEN/GI: NPO at  midnight for GI procedure PPx: SCDs Dispo:Home with home health  tomorrow. Barriers include continued medical work-up.   Subjective:  Patient reports that he is doing okay this morning.  He does state that he has a cough and has been up in the night because of it.  Objective: Temp:  [97.4 F (36.3 C)-98.3 F (36.8 C)] 98.3 F (36.8 C) (06/28 0539) Pulse Rate:  [64-115] 115 (06/28 0539) Resp:  [18] 18 (06/28 0539) BP: (90-98)/(55-65) 90/65 (06/28 0539) SpO2:  [100 %] 100 % (06/28 0539) Physical Exam: General: Thin elderly male, NAD initially sleeping in bed Cardiovascular: Regular rate, irregular rhythm Respiratory: Coughing frequently during examination, difficult to auscultate lung sounds secondary to persistent cough with deep inhalations Abdomen: Soft, nontender, nondistended colostomy bag present Extremities: Moving all extremities equally and appropriately.  Laboratory: Recent Labs  Lab 11/24/20 0355 11/24/20 0951 11/25/20 0332 11/25/20 1007 11/25/20 1605 11/25/20 2203 11/26/20 0443  WBC 4.1  --  4.1  --   --   --  5.0  HGB 8.3*   < > 9.0*   < > 9.4* 8.7* 8.6*  HCT 24.8*   < > 27.5*   < > 28.4* 26.4* 26.3*  PLT 91*  --  89*  --   --   --  84*   < > = values in this interval not displayed.   Recent Labs  Lab 11/23/20 0349 11/24/20 0355 11/26/20 0443  NA 135 138 138  K 3.5 3.8 3.7  CL 104 108 103  CO2 22 24 27   BUN 9 11 17   CREATININE 0.90 1.06 1.25*  CALCIUM 8.1* 8.2* 8.2*  GLUCOSE 76 102* 105*    Imaging/Diagnostic Tests: No results found.  Rise Patience, DO 11/26/2020, 9:17 AM PGY-1, [place] Intern pager: [phone], text pages welcome

## 2020-11-26 NOTE — Progress Notes (Signed)
Pt refused to have his abdominal dressing changed and refused his ostomy be changed as well. Offered twice tonight. Will try again.

## 2020-11-26 NOTE — Progress Notes (Signed)
Pt off unit to endo

## 2020-11-26 NOTE — Progress Notes (Signed)
Physical Therapy Treatment Patient Details Name: [name] MRN: [id number] DOB: [birth year] Today's Date: 11/26/2020    History of Present Illness 75 y/o male presented to the ED on 6/22 for dyspnea and symptomatic anemia 2/2 GI bleed. 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 Patient underwent enteroscopy with finding of gastritis. Colonscopy performed but did not show definitive source of bleeding. PMH: bacterial endocarditis status post mitral valve replacement 07/2016, DVT, DM, hypertension, dementia, tobacco use, COPD, atrial fibrillation, sarcoidosis, and basal cell carcinoma, colon cancer    PT Comments    Pt was seen for mobility on level surface, but was unable to walk far due to severe elevation of HR.  He is not understanding PT concern for this finding, but also was up to the chair and had to be convinced to return to bed.  PT and pt were only able to walk the short trip, but he is motivated to do more.  Plan is to make progress with gait and balance as his health and vital signs permits.  Follow up as goals of acute PT dictate.   Follow Up Recommendations  Home health PT     Equipment Recommendations       Recommendations for Other Services       Precautions / Restrictions Precautions Precautions: Fall Precaution Comments: ostomy, watch HR Restrictions Weight Bearing Restrictions: No    Mobility  Bed Mobility Overal bed mobility: Modified Independent                  Transfers Overall transfer level: Modified independent Equipment used: None                Ambulation/Gait Ambulation/Gait assistance: Supervision Gait Distance (Feet): 40 Feet Assistive device: None       General Gait Details: pt is walking with trunk flexed and mild shuffle, but mostly in elevated HR.  Pt was 135 at rest, but jumped up to walk and quickly reached 158.  Sat him down, pt unaware of why this is an  Science writer [employer] (Stroke Patients Only)       Balance Overall balance assessment: Mild deficits observed, not formally tested Sitting-balance support: Feet supported Sitting balance-Leahy Scale: Good     Standing balance support: Single extremity supported Standing balance-Leahy Scale: Fair Standing balance comment: min guard and able to balance but cannot walk due to elevation of HR                            Cognition Arousal/Alertness: Awake/alert Behavior During Therapy: WFL for tasks assessed/performed Overall Cognitive Status: History of cognitive impairments - at baseline                                 General Comments: HR is quite high, pt is not able to remember information about it, repeating to him      Exercises      General Comments General comments (skin integrity, edema, etc.): pt is more balanced but less controlled with HR      Pertinent Vitals/Pain Pain Assessment: Faces Faces Pain Scale: No hurt Pain Intervention(s): Monitored during session    Home Living  Prior Function            PT Goals (current goals can now be found in the care plan section) Acute Rehab PT Goals Patient Stated Goal: return home Progress towards PT goals: PT to reassess next treatment    Frequency    Min 3X/week      PT Plan Current plan remains appropriate    Co-evaluation              AM-PAC PT "6 Clicks" Mobility   Outcome Measure  Help needed turning from your back to your side while in a flat bed without using bedrails?: None Help needed moving from lying on your back to sitting on the side of a flat bed without using bedrails?: None Help needed moving to and from a bed to a chair (including a wheelchair)?: None Help needed standing up from a chair using your arms (e.g., wheelchair or bedside chair)?: A Little Help needed to walk in  hospital room?: A Little Help needed climbing 3-5 steps with a railing? : A Little 6 Click Score: 21    End of Session Equipment Utilized During Treatment: Gait belt Activity Tolerance: Patient tolerated treatment well;Treatment limited secondary to medical complications (Comment) Patient left: in bed;in chair;with call bell/phone within reach;with bed alarm set;with chair alarm set (started in chair and convinced pt to get back to bed)   PT Visit Diagnosis: Other abnormalities of gait and mobility (R26.89)     Time: 2440-1027 PT Time Calculation (min) (ACUTE ONLY): 24 min  Charges:  $Gait Training: 8-22 mins $Therapeutic Activity: 8-22 mins                    Ramond Dial 11/26/2020, 1:52 PM  Mee Hives, PT MS Acute Rehab Dept. Number: [place] and [place]

## 2020-11-26 NOTE — Discharge Instructions (Addendum)
You were hospitalized due to severe anemia that required several blood transfusions. Gastroenterology evaluated you and completed a procedure to try and help stop the bleeding in your GI system.  If you continue to have, or began having more bleeding in your stools or dark stools and please return for further evaluation.  You have several scheduled appointments, will be very important to follow-up to make sure that your blood counts are okay.  You are also being prescribed a medication to help protect your stomach as well as an iron tablet to help your anemia.

## 2020-11-27 ENCOUNTER — Other Ambulatory Visit (HOSPITAL_COMMUNITY): Payer: Self-pay

## 2020-11-27 LAB — CBC
HCT: 24.8 % — ABNORMAL LOW (ref 39.0–52.0)
Hemoglobin: 8.1 g/dL — ABNORMAL LOW (ref 13.0–17.0)
MCH: 29.6 pg (ref 26.0–34.0)
MCHC: 32.7 g/dL (ref 30.0–36.0)
MCV: 90.5 fL (ref 80.0–100.0)
Platelets: 62 10*3/uL — ABNORMAL LOW (ref 150–400)
RBC: 2.74 MIL/uL — ABNORMAL LOW (ref 4.22–5.81)
RDW: 16.7 % — ABNORMAL HIGH (ref 11.5–15.5)
WBC: 7.3 10*3/uL (ref 4.0–10.5)
nRBC: 0.4 % — ABNORMAL HIGH (ref 0.0–0.2)

## 2020-11-27 LAB — HEMOGLOBIN AND HEMATOCRIT, BLOOD
HCT: 24.1 % — ABNORMAL LOW (ref 39.0–52.0)
HCT: 27.1 % — ABNORMAL LOW (ref 39.0–52.0)
Hemoglobin: 7.9 g/dL — ABNORMAL LOW (ref 13.0–17.0)
Hemoglobin: 8.9 g/dL — ABNORMAL LOW (ref 13.0–17.0)

## 2020-11-27 MED ORDER — FERROUS SULFATE 325 (65 FE) MG PO TABS
325.0000 mg | ORAL_TABLET | Freq: Every day | ORAL | 0 refills | Status: DC
  Filled 2020-11-27: qty 30, 30d supply, fill #0

## 2020-11-27 MED ORDER — PANTOPRAZOLE SODIUM 40 MG PO TBEC
40.0000 mg | DELAYED_RELEASE_TABLET | Freq: Every day | ORAL | 0 refills | Status: DC
  Filled 2020-11-27: qty 30, 30d supply, fill #0

## 2020-11-27 MED ORDER — METOPROLOL TARTRATE 25 MG PO TABS
25.0000 mg | ORAL_TABLET | Freq: Two times a day (BID) | ORAL | 0 refills | Status: DC
  Filled 2020-11-27: qty 60, 30d supply, fill #0

## 2020-11-27 NOTE — Discharge Summary (Signed)
[name] Discharge Summary  Patient name: [name] Medical record number: [id number] Date of birth: March 25, 1946 Age: 75 y.o. Gender: male Date of Admission: 11/20/2020  Date of Discharge: 11/27/2020 Admitting Physician: Lind Covert, MD  Primary Care Provider: Pcp, No Consultants: GI, general surgery, oncology  Indication for Hospitalization: Symptomatic anemia  Discharge Diagnoses/Problem List:  Patient Active Problem List   Diagnosis Date Noted   Angiodysplasia of intestine with hemorrhage    Melena    Anemia 11/21/2020   Symptomatic anemia 11/20/2020   Upper GI bleed    Protein-calorie malnutrition, severe 10/23/2020   Occult blood in stools    Iron deficiency anemia due to chronic blood loss    Atrial flutter ([place]) 10/17/2020   Microcytic anemia 10/17/2020   COPD (chronic obstructive pulmonary disease) ([place]) 10/17/2020   Atrial fibrillation with RVR ([place]) 10/17/2020   Basal cell carcinoma (BCC) of skin of left wrist 09/04/2020   Acute bronchitis with COPD ([place]) 01/29/2020   Skin lesions 01/29/2020   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   Hx of bacterial endocarditis 01/29/2020   Frequent falls 01/29/2020   H/O mitral valve replacement 01/29/2020    Disposition: Home with home health  Discharge Condition: Stable, improved  Discharge Exam:  General: NAD, sitting up in bed Cardiovascular: Tachycardic, irregular rhythm, 2+ peripheral pulses Respiratory: Breathing comfortably room air, no increased breathing, clear to auscultation bilaterally Abdomen: Soft, nontender, nondistended, colostomy present Extremities: Moving extremities equally and appropriately.  Brief Thomas Course:  [name] is a 75 y.o. male presenting with dyspnea 2/2 GI bleed. PMH is significant for anemia, A. fib, bacteremia, MV replacement, basal cell carcinoma, COPD, dementia, pre-diabetes, DVT, HTN, thrombocytopenia, colon cancer (colostomy).  Symptomatic  anemia  GI bleed Patient admitted with melena in colostomy and dyspnea found to have significant anemia with a hemoglobin of 4.5.  Patient was transfused a total of 4 units PRBC while admitted and VTE prophylaxis and home Eliquis was held.  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  Balloon enteroscopy was done on 6/28 and a single non-bleeding angioectasia was in the jejunum and treated with argon plasma coagulation.  Endoscopy was completed through the jejunal and area was tattooed to the distal extent that was reached.  If there is suspicion for acute rebleeding he may consider a repeat VCE.  If active bleeding noted distal to the tattoo, would not be amenable to repeat single balloon enteroscopy.  Hemoglobin remained stable upon discharge at 8.9.  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  Oncology was consulted in the [facility] with recommendations to complete the restaging CT scans.  CT scan showed decrease in size of pelvic adenopathy with stable pulmonary nodules and scattered hepatic hypodensities that are likely benign and new node at the right internal/external iliac bifurcation measuring 22mm.  Patient to follow-up with oncology outpatient.  Atrial fibrillation Due to patient's GI bleed, home Eliquis was held.  Patient's home dose of metoprolol 50 mg twice daily was decreased to 25 mg twice daily due to lower blood pressures during the day. After GI procedures, Eliquis was able to be resumed, though unsure that patient will take Eliquis after this admission.   Thrombocytopenia Patient was thrombocytopenic on admission with platelets of 69.  DVT  prophylaxis was held secondary to thrombocytopenia and active GI bleed.  Remained stable and chronic with platelets  of 84 prior to discharge.   All other chronic conditions stable during hospitalization.   Issues for follow-up Recommend GI follow-up for further monitoring.  If recurrence of bleeding, consider repeat VCE.  If active bleeding distal to the tattoo, would not be amenable to repeat single balloon enteroscopy. Follow-up with oncology with plan for assay for circulating tumor DNA. Repeat CBC to monitor Hgb and platelets at follow-up appointment with PCP. Monitor BP as blood pressures were baseline in the 90s/50s with appropriate MAPs.  Home metoprolol was decreased to 25mg  BID though patient had intermittent tachycardia, close monitoring to determine if metoprolol should be increased back to 50mg  BID.  Follow-up with cardiology, patient not agreeable to taking Eliquis further.  Home medication of metoprolol was decreased to 25 mg while in [facility] due to lower blood pressures.  May need the dose increased back to prior dose if blood pressures improve.    Significant Procedures:  Colonoscopy and capsule endoscopy Balloon enteroscopy with argon plasma coagulation of angiectasia  Significant Labs and Imaging:  Recent Labs  Lab 11/24/20 0355 11/24/20 0951 11/25/20 0332 11/25/20 1007 11/26/20 0443 11/26/20 1543 11/26/20 2155 11/27/20 0308 11/27/20 1029  WBC 4.1  --  4.1  --  5.0  --   --   --   --   HGB 8.3*   < > 9.0*   < > 8.6*   < > 8.5* 7.9* 8.9*  HCT 24.8*   < > 27.5*   < > 26.3*   < > 26.2* 24.1* 27.1*  PLT 91*  --  89*  --  84*  --   --   --   --    < > = values in this interval not displayed.   Recent Labs  Lab 11/21/20 1012 11/22/20 0355 11/23/20 0349 11/24/20 0355 11/26/20 0443  NA 143 138 135 138 138  K 4.2 3.6 3.5 3.8 3.7  CL 109 108 104 108 103  CO2  --  22 22 24 27   GLUCOSE 91 85 76 102* 105*  BUN 28* 20 9 11 17   CREATININE 0.90 0.80 0.90 1.06 1.25*  CALCIUM  --  7.8* 8.1* 8.2* 8.2*  MG  --   --   --  1.8  --       Results/Tests Pending at Time of  Discharge: None  Discharge Medications:  Allergies as of 11/27/2020       Reactions   Orange Juice [orange Oil] Hives        Medication List     STOP taking these medications    Iron Polysacch Cmplx-B12-FA 150-0.025-1 MG Caps   methocarbamol 500 MG tablet Commonly known as: ROBAXIN       TAKE these medications    acetaminophen 325 MG tablet Commonly known as: TYLENOL Take 2 tablets (650 mg total) by mouth every 6 (six) hours as needed for mild pain or fever.   Cartia XT 120 MG 24 hr capsule Generic drug: diltiazem Take 1 capsule (120 mg total) by mouth at bedtime.   Eliquis 5 MG Tabs tablet Generic drug: apixaban Take 1 tablet (5 mg total) by mouth 2 (two) times daily.   ferrous sulfate 325 (65 FE) MG tablet Take 1 tablet (325 mg total) by mouth daily.   metoprolol tartrate 25 MG tablet Commonly known as: LOPRESSOR Take 1 tablet (25 mg total) by mouth 2 (two)  times daily. What changed:  medication strength how much to take   Multi-Vitamin Daily Tabs Take 1 tablet by mouth daily.   nicotine 14 mg/24hr patch Commonly known as: NICODERM CQ - dosed in mg/24 hours Place 1 patch (14 mg total) onto the skin daily.   ondansetron 4 MG tablet Commonly known as: Zofran Take 1 tablet (4 mg total) by mouth daily as needed for nausea or vomiting.   oxyCODONE 5 MG immediate release tablet Commonly known as: Oxy IR/ROXICODONE Take 0.5-1 tablets (2.5-5 mg total) by mouth every 6 (six) hours as needed for moderate pain or severe pain.   pantoprazole 40 MG tablet Commonly known as: PROTONIX Take 1 tablet (40 mg total) by mouth daily at 6 (six) AM. Start taking on: November 28, 2020   polyethylene glycol powder 17 GM/SCOOP powder Commonly known as: GLYCOLAX/MIRALAX Dissolve 2 capfuls (34 g total) by mouth daily as needed for mild constipation.   Spiriva HandiHaler 18 MCG inhalation capsule Generic drug: tiotropium Place 1 capsule (18 mcg total) into inhaler and inhale  daily.   Ventolin HFA 108 (90 Base) MCG/ACT inhaler Generic drug: albuterol Inhale 1-2 puffs into the lungs every 6 (six) hours as needed for wheezing or shortness of breath.               Discharge Care Instructions  (From admission, onward)           Start     Ordered   11/27/20 0000  Discharge wound care:       Comments: Per wound care consult   11/27/20 1446            Discharge Instructions: Please refer to Patient Instructions section of EMR for full details.  Patient was counseled important signs and symptoms that should prompt return to medical care, changes in medications, dietary instructions, activity restrictions, and follow up appointments.   Follow-Up Appointments:  Follow-up Information     [facility].   Specialty: Home Health Services Contact information: [place] [address] [phone]         [facility]. Go on 11/29/2020.   Specialty: Family Medicine Why: Your appointment is at 11:10am, please arrive 15 minutes early. Contact information: [address] [id number] mc [place]        Donato Heinz, MD. Schedule an appointment as soon as possible for a visit in 3 day(s).   Specialties: Cardiology, Radiology Why: For [facility] follow-up and cardiac management Contact information: [address] [place] [address] [phone]         Ladell Pier, MD. Go on 12/06/2020.   Specialty: Oncology Contact information: [place] [address] [place], [place], DO 11/27/2020, 2:51 PM PGY-1, [place]

## 2020-11-27 NOTE — Progress Notes (Addendum)
Family Medicine Teaching Service Daily Progress Note Intern Pager: [phone]  Patient name: [name] Medical record number: [id number] Date of birth: 11/13/1945 Age: 75 y.o. Gender: male  Primary Care Provider: Pcp, No Consultants: GI, oncology, general surgery Code Status: Full  Pt Overview and Major Events to Date:  [name] is a 75 y.o. male presenting with dyspnea 2/2 GI bleed. PMH is significant for anemia, A. fib, bacteremia, MV replacement, basal cell carcinoma, COPD, dementia, pre-diabetes, DVT, HTN, thrombocytopenia, colon cancer (colostomy).   Symptomatic anemia  GI bleed  s/p sigmoid colectomy and end colostomy Hgb this morning 7.9, transfusion threshold is 7.  Small bowel enteroscopy showed single nonbleeding angiectasia in the jejunum which was treated with argon plasma coagulation.  Hopeful for advancement of diet as tolerated and possible discharge today. - GI following, appreciate recs - General surgery following, appreciate recs - continue to hold VTE ppx - Follow-up enteroscopy biopsies   CT lung findings Patient's cough is improving, at this time feel like it is a more of a chemical pneumonitis than an actual bacterial infection.  We will continue to monitor without administration of antibiotics until clinically appropriate. - Monitor for signs of URI/pneumonia.   Adenocarcinoma of the colon - Oncology to follow outpatient   Atrial fibrillation Patient's heart rates have been labile during admission, the last 24 hours has ranged from heart rate of 41-134 with most recent of 115.  Patient is going to refuse taking Eliquis in the outpatient setting, will need to have follow-up with cardiology and have discussed the risk of benefits of not taking his medication. Will need close outpatient follow-up. - Continue diltiazem 120mg  QHS - Continue metoprolol 25mg  BID - Holding home Eliquis, patient is refusing   Thrombocytopenia Stable and chronic. -  Continue to hold VTE ppx - AM CBC   FEN/GI: NPO at midnight for GI procedure PPx: SCDs Dispo:Home with home health  today. Barriers include finishing medical work-up and diet advancement.   Subjective:  Patient reports that he is ready to eat he is upset that he is still on full liquid diet.  Patient wants to go home today and he will not be taking Eliquis when he goes home as it is "what got me in this mess".  Objective: Temp:  [97.7 F (36.5 C)-98.3 F (36.8 C)] 98.3 F (36.8 C) (06/29 0516) Pulse Rate:  [41-134] 104 (06/29 0516) Resp:  [16-24] 16 (06/29 0516) BP: (90-111)/(41-70) 99/64 (06/29 0516) SpO2:  [92 %-100 %] 97 % (06/29 0516) Weight:  [65.8 kg] 65.8 kg (06/28 1127) Physical Exam: General: NAD, sitting up in bed Cardiovascular: Tachycardic, irregular rhythm, 2+ peripheral pulses Respiratory: Breathing comfortably room air, no increased breathing, clear to auscultation bilaterally Abdomen: Soft, nontender, nondistended, colostomy present Extremities: Moving extremities equally and appropriately.  Laboratory: Recent Labs  Lab 11/24/20 0355 11/24/20 0951 11/25/20 0332 11/25/20 1007 11/26/20 0443 11/26/20 1543 11/26/20 2155 11/27/20 0308  WBC 4.1  --  4.1  --  5.0  --   --   --   HGB 8.3*   < > 9.0*   < > 8.6* 8.6* 8.5* 7.9*  HCT 24.8*   < > 27.5*   < > 26.3* 26.3* 26.2* 24.1*  PLT 91*  --  89*  --  84*  --   --   --    < > = values in this interval not displayed.   Recent Labs  Lab 11/23/20 0349 11/24/20 0355 11/26/20 0443  NA  135 138 138  K 3.5 3.8 3.7  CL 104 108 103  CO2 22 24 27   BUN 9 11 17   CREATININE 0.90 1.06 1.25*  CALCIUM 8.1* 8.2* 8.2*  GLUCOSE 76 102* 105*     Imaging/Diagnostic Tests: No results found.   Rise Patience, DO 11/27/2020, 9:33 AM PGY-1, [place] Intern pager: [phone], text pages welcome

## 2020-11-27 NOTE — Progress Notes (Addendum)
Daily Rounding Note  11/27/2020, 12:58 PM  LOS: 6 days   SUBJECTIVE:   Chief complaint:   FOBT + dark stool.  SB AVMs   Pt really pushing to go home this afternoon.  Tolerating full liquids, diet advanced to regular this morning but has not received lunch yet. Says his stools are brown.  No abdominal pain.  No weakness when he gets up to walk around the room.  No shortness of breath.  OBJECTIVE:         Vital signs in last 24 hours:    Temp:  [97.7 F (36.5 C)-98.6 F (37 C)] 98.6 F (37 C) (06/29 0941) Pulse Rate:  [41-133] 115 (06/29 0941) Resp:  [16-24] 20 (06/29 0941) BP: (90-111)/(41-67) 97/67 (06/29 0941) SpO2:  [92 %-100 %] 99 % (06/29 0941) Last BM Date: 11/26/20 Filed Weights   11/20/20 1737 11/21/20 1000 11/26/20 1127  Weight: 65.3 kg 65.3 kg 65.8 kg   General: Alert.  Does not look acutely ill. Heart: Irregularly irregular, rate controlled. Chest: Clear bilaterally without labored breathing or cough Abdomen: Soft without tenderness.  Active bowel sounds.  No distention.  Ostomy bag is empty, ostomy itself is pink and healthy.  No visible blood. Extremities: No CCE Neuro/Psych: Alert.  Anxious.  Oriented x3.  Moves all 4 limbs without tremor or gross weakness  Intake/Output from previous day: 06/28 0701 - 06/29 0700 In: 520 [P.O.:120; I.V.:400] Out: 300 [Urine:300]  Intake/Output this shift: No intake/output data recorded.  Lab Results: Recent Labs    11/25/20 0332 11/25/20 1007 11/26/20 0443 11/26/20 1543 11/26/20 2155 11/27/20 0308 11/27/20 1029  WBC 4.1  --  5.0  --   --   --   --   HGB 9.0*   < > 8.6*   < > 8.5* 7.9* 8.9*  HCT 27.5*   < > 26.3*   < > 26.2* 24.1* 27.1*  PLT 89*  --  84*  --   --   --   --    < > = values in this interval not displayed.   BMET Recent Labs    11/26/20 0443  NA 138  K 3.7  CL 103  CO2 27  GLUCOSE 105*  BUN 17  CREATININE 1.25*  CALCIUM 8.2*    LFT No results for input(s): PROT, ALBUMIN, AST, ALT, ALKPHOS, BILITOT, BILIDIR, IBILI in the last 72 hours. PT/INR No results for input(s): LABPROT, INR in the last 72 hours. Hepatitis Panel No results for input(s): HEPBSAG, HCVAB, HEPAIGM, HEPBIGM in the last 72 hours.  Studies/Results: No results found.  ASSESMENT:      GI bleed.  Melena in setting of Eliquis.  11/22/20 Colonoscopy via ostomy: No bleeding source ID'd.   11/22/20 VCE: probable AVM with active oozing in mid small bowel, 1 other small AVM more distally.  Both out of reach of enteroscope  11/26/20 SBE: gastritis.  Non-bleeding AVM at jejunum was APCd, distal extent of exam marked w tattoo.  Unable to reach area of bleeding SB AVM seen on VCE.   If recurrent bleeding, will need dbl baloon enteroscopy at [facility].   Hgb 7.9 >> 8.9 over latest 7.5 hours.      Acute blood loss anemia, background IDA.[name]  4 PRBCs so far.   Hgb nadir 7.9 this AM, 8.9 7.5 h later.  Baselin 9 to 9.5.      Sigmoid cancer.  Resection, colostomy 10/24/20.  Lesions in  lung (may not be mets), pelvic nodes.  R iliac bifucation,     Chronic Eliquis for a flutter, RVR.  On hold.  Pt refusing the med.      Thrombocytopenia.     PLAN    Protonix 40 mg po daily.      ? Timing of discharge home?  GI signing off.  Would not disagree with discharging today if that is the plan but in the meantime, in case he stays overnight I ordered CBC at 5 PM and 5 AM  Regarding anemia he can follow-up with Dr. Benay Spice.  Has appt set for 7/8.  GI offic fup prn w Dr Loletha Carrow.      ? When will be safe to resume Eliquis?  Though w pt refusing this med, may be moot as to when to restart.      [name]  11/27/2020, 12:58 PM Phone [phone]    Attending physician's note   I have taken an interval history, reviewed the chart and examined the patient. I agree with the Advanced Practitioner's note, impression and recommendations.   S/p single balloon enteroscopy  with APC of jejunal AVM No further bleeding, per patient he had a normal appearing BM this afternoon.  He is reluctant to go back on anticoagulation  Okay to restart Eliquis in 3 days on 7/2 from GI standpoint  Follow-up with hematology with routine CBC every 3 to 4 weeks and IV iron infusion as needed  GI will sign off, available if have any questions    I have spent 25 minutes of patient care (this includes precharting, chart review, review of results, face-to-face time used for counseling as well as treatment plan and follow-up. The patient was provided an opportunity to ask questions and all were answered. The patient agreed with the plan and demonstrated an understanding of the instructions.  [name] , MD [phone]

## 2020-11-28 ENCOUNTER — Telehealth: Payer: Self-pay | Admitting: Oncology

## 2020-11-28 ENCOUNTER — Other Ambulatory Visit: Payer: Self-pay | Admitting: *Deleted

## 2020-11-28 ENCOUNTER — Encounter (HOSPITAL_COMMUNITY): Payer: Self-pay | Admitting: Gastroenterology

## 2020-11-28 ENCOUNTER — Ambulatory Visit: Payer: Medicare Other | Admitting: General Practice

## 2020-11-28 DIAGNOSIS — C187 Malignant neoplasm of sigmoid colon: Secondary | ICD-10-CM

## 2020-11-28 NOTE — Telephone Encounter (Signed)
Scheduled appt per 6/30 sch msg - called pt to let him know lab appt has been added.  Patient is aware of appt added.

## 2020-11-28 NOTE — Progress Notes (Signed)
Per Dr. Benay Spice: Ordered Guardant Reveal test for 12/06/20

## 2020-11-29 ENCOUNTER — Inpatient Hospital Stay: Payer: Medicare Other

## 2020-12-06 ENCOUNTER — Inpatient Hospital Stay: Payer: Medicare Other

## 2020-12-06 ENCOUNTER — Other Ambulatory Visit: Payer: Self-pay | Admitting: Family Medicine

## 2020-12-06 ENCOUNTER — Inpatient Hospital Stay: Payer: Medicare Other | Attending: Nurse Practitioner | Admitting: Oncology

## 2020-12-06 ENCOUNTER — Other Ambulatory Visit: Payer: Self-pay

## 2020-12-06 VITALS — BP 101/65 | HR 75 | Temp 97.8°F | Resp 20 | Ht 72.0 in | Wt 148.0 lb

## 2020-12-06 DIAGNOSIS — I4892 Unspecified atrial flutter: Secondary | ICD-10-CM | POA: Insufficient documentation

## 2020-12-06 DIAGNOSIS — F0390 Unspecified dementia without behavioral disturbance: Secondary | ICD-10-CM | POA: Diagnosis not present

## 2020-12-06 DIAGNOSIS — J449 Chronic obstructive pulmonary disease, unspecified: Secondary | ICD-10-CM | POA: Diagnosis not present

## 2020-12-06 DIAGNOSIS — C182 Malignant neoplasm of ascending colon: Secondary | ICD-10-CM | POA: Insufficient documentation

## 2020-12-06 DIAGNOSIS — C187 Malignant neoplasm of sigmoid colon: Secondary | ICD-10-CM

## 2020-12-06 DIAGNOSIS — D509 Iron deficiency anemia, unspecified: Secondary | ICD-10-CM | POA: Insufficient documentation

## 2020-12-06 DIAGNOSIS — F17210 Nicotine dependence, cigarettes, uncomplicated: Secondary | ICD-10-CM | POA: Insufficient documentation

## 2020-12-06 DIAGNOSIS — D696 Thrombocytopenia, unspecified: Secondary | ICD-10-CM | POA: Insufficient documentation

## 2020-12-06 DIAGNOSIS — Z85828 Personal history of other malignant neoplasm of skin: Secondary | ICD-10-CM | POA: Diagnosis not present

## 2020-12-06 DIAGNOSIS — Z933 Colostomy status: Secondary | ICD-10-CM | POA: Diagnosis not present

## 2020-12-06 DIAGNOSIS — E119 Type 2 diabetes mellitus without complications: Secondary | ICD-10-CM | POA: Diagnosis not present

## 2020-12-06 DIAGNOSIS — I10 Essential (primary) hypertension: Secondary | ICD-10-CM | POA: Diagnosis not present

## 2020-12-06 LAB — CBC WITH DIFFERENTIAL (CANCER CENTER ONLY)
Abs Immature Granulocytes: 0.03 10*3/uL (ref 0.00–0.07)
Basophils Absolute: 0 10*3/uL (ref 0.0–0.1)
Basophils Relative: 1 %
Eosinophils Absolute: 0.1 10*3/uL (ref 0.0–0.5)
Eosinophils Relative: 3 %
HCT: 28.6 % — ABNORMAL LOW (ref 39.0–52.0)
Hemoglobin: 8.9 g/dL — ABNORMAL LOW (ref 13.0–17.0)
Immature Granulocytes: 1 %
Lymphocytes Relative: 14 %
Lymphs Abs: 0.6 10*3/uL — ABNORMAL LOW (ref 0.7–4.0)
MCH: 29 pg (ref 26.0–34.0)
MCHC: 31.1 g/dL (ref 30.0–36.0)
MCV: 93.2 fL (ref 80.0–100.0)
Monocytes Absolute: 0.5 10*3/uL (ref 0.1–1.0)
Monocytes Relative: 12 %
Neutro Abs: 2.9 10*3/uL (ref 1.7–7.7)
Neutrophils Relative %: 69 %
Platelet Count: 120 10*3/uL — ABNORMAL LOW (ref 150–400)
RBC: 3.07 MIL/uL — ABNORMAL LOW (ref 4.22–5.81)
RDW: 16 % — ABNORMAL HIGH (ref 11.5–15.5)
WBC Count: 4.2 10*3/uL (ref 4.0–10.5)
nRBC: 0 % (ref 0.0–0.2)

## 2020-12-06 LAB — CMP (CANCER CENTER ONLY)
ALT: 10 U/L (ref 0–44)
AST: 9 U/L — ABNORMAL LOW (ref 15–41)
Albumin: 3.2 g/dL — ABNORMAL LOW (ref 3.5–5.0)
Alkaline Phosphatase: 62 U/L (ref 38–126)
Anion gap: 5 (ref 5–15)
BUN: 20 mg/dL (ref 8–23)
CO2: 29 mmol/L (ref 22–32)
Calcium: 8.9 mg/dL (ref 8.9–10.3)
Chloride: 104 mmol/L (ref 98–111)
Creatinine: 0.95 mg/dL (ref 0.61–1.24)
GFR, Estimated: >60 mL/min (ref >60)
Glucose, Bld: 116 mg/dL — ABNORMAL HIGH (ref 70–99)
Potassium: 4.3 mmol/L (ref 3.5–5.1)
Sodium: 138 mmol/L (ref 135–145)
Total Bilirubin: 0.6 mg/dL (ref 0.3–1.2)
Total Protein: 6.5 g/dL (ref 6.5–8.1)

## 2020-12-06 MED ORDER — DILTIAZEM HCL ER COATED BEADS 120 MG PO CP24: 120.0000 mg | ORAL_CAPSULE | Freq: Every day | ORAL | 1 refills | Status: DC

## 2020-12-06 MED ORDER — SPIRIVA HANDIHALER 18 MCG IN CAPS: 18.0000 ug | ORAL_CAPSULE | Freq: Every day | RESPIRATORY_TRACT | 1 refills | Status: DC

## 2020-12-06 NOTE — Telephone Encounter (Signed)
Called April and informed. Spoke with Dr. Andria Frames who states he will accept this patient as a new patient.   Mailed new patient paperwork for completion.   Routing to new patient coordinator.   Talbot Grumbling, RN

## 2020-12-06 NOTE — Progress Notes (Signed)
[facility] OFFICE PROGRESS NOTE   Diagnosis: Colon cancer  INTERVAL HISTORY:   [name] was discharged from the hospital on 11/27/2020 after admission with GI bleeding.  A small bowel endoscopy on 11/26/2020 revealed a single nonbleeding angiectasia in the jejunum, treated with argon plasma coagulation.  A tattoo was placed at the distal extent of the exam.  He denies recurrent bleeding.  He is not taking anticoagulation therapy.  The abdominal wound is healing.  Objective:  Vital signs in last 24 hours:  Blood pressure 101/65, pulse 75, temperature 97.8 F (36.6 C), temperature source Oral, resp. rate 20, height 6' (1.829 m), weight 148 lb (67.1 kg), SpO2 100 %.     Resp: Lungs clear bilaterally Cardio: Regular rate and rhythm GI: No hepatosplenomegaly, left lower quadrant colostomy with brown stool, the midline incision has almost completely healed with a few areas of superficial opening Vascular: No leg edema  Skin: Raised malignant appearing mass at the dorsum of the left hand, 4-5 cm mass at the left forearm with mild bleeding   Lab Results:  Lab Results  Component Value Date   WBC 7.3 11/27/2020   HGB 8.1 (L) 11/27/2020   HCT 24.8 (L) 11/27/2020   MCV 90.5 11/27/2020   PLT 62 (L) 11/27/2020   NEUTROABS 3.2 11/20/2020    CMP  Lab Results  Component Value Date   NA 138 11/26/2020   K 3.7 11/26/2020   CL 103 11/26/2020   CO2 27 11/26/2020   GLUCOSE 105 (H) 11/26/2020   BUN 17 11/26/2020   CREATININE 1.25 (H) 11/26/2020   CALCIUM 8.2 (L) 11/26/2020   PROT 6.0 (L) 10/23/2020   ALBUMIN 2.3 (L) 10/23/2020   AST 15 10/23/2020   ALT 19 10/23/2020   ALKPHOS 63 10/23/2020   BILITOT 1.2 10/23/2020   GFRNONAA 60 (L) 11/26/2020   GFRAA 79 01/29/2020    Lab Results  Component Value Date   CEA1 3.9 10/22/2020     Medications: I have reviewed the patient's current medications.   Assessment/Plan: Sigmoid colon adenocarcinoma -Colonoscopy  10/21/2020- partially obstructing mass found in the sigmoid colon consistent with adenocarcinoma. -CEA on 10/22/2020 was 3.9 -CTs 10/22/2020-colonic mass with pelvic lymphadenopathy including right common iliac and external iliac nodes, small pulmonary nodules -10/24/2020-sigmoid colectomy, end colostomy, tumor stuck to pelvic sidewall with rind of tissue remaining at completion of surgery -Pathology- moderately differentiated adenocarcinoma the sigmoid colon, tumor extends into pericolonic connective tissue, no lymphovascular perineural invasion, 0/14 lymph nodes, carcinoma involves an area with the specimen was disrupted at mesenteric margin -CTs 11/24/2020-interval resection of colonic mass, decreased size of previously noted right pelvic adenopathy, new noted at the right internal/external iliac bifurcation, unchanged pulmonary nodules 2.  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 3.  Thrombocytopenia 4.  Atrial flutter 5.  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  COPD 7.  Hypertension 8.  Diabetes mellitus 9.  Dementia 10.  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 Martin Majestic who is going to check with some Mohs specialist to see if disease is resectable, they were holding on radiation appointments until this could be explored. -Established care with the wound center on 09/13/2019 12.  Admission with GI bleeding 11/20/2020 Endoscopy 11/21/2020-mild prepyloric erosions, gastritis Colonoscopy 11/22/2020-no bleeding source identified Capsule endoscopy 11/22/2020-probable AVM  with active oozing in the mid small bowel, 1 other small AVM more distally-both out of reach of enteroscope 11/26/2020 balloon enteroscopy-single nonbleeding AVM in the jejunum treated with  argon plasma coagulation    Disposition: [name] appears stable.  The hemoglobin is higher compared to when he was discharged from the hospital.  He says that he will refuse further anticoagulation therapy.  He was diagnosed with stage II colon cancer when he underwent a sigmoid colectomy on 10/24/2020.  There was no clear evidence of metastatic disease on restaging CTs 11/24/2020.  We discussed the indication for adjuvant systemic therapy.  He submitted a blood sample for Guardant reveal testing today.  He will return for an office visit and further discussion during the week of 12/16/2020.  I will recommend adjuvant therapy if the circulating tumor DNA test is positive.  I recommended he proceed with treatment of the left arm/hand skin cancers.  The apparent tumor at the left forearm was bandaged today.  We recommended to use Telfa gauze to cover this area.    Betsy Coder, MD  12/06/2020  9:54 AM

## 2020-12-06 NOTE — Telephone Encounter (Signed)
Rx approved

## 2020-12-06 NOTE — Telephone Encounter (Signed)
Patient's daughter in law, April, calls nurse line requesting prescription refills for patient. Patient is not established with PCP, however, would like to start process of becoming new patient with Dr. Andria Frames. Daughter in law reports that her entire family is under the care of Dr. Andria Frames.   Patient was recently admitted under Dr. Owens Shark on 6/22. Patient was informed by hospital that patient would need to come back to the hospital for additional refills if needed. Reports that patient is doing well and would like to stay out of the hospital if possible.   Please advise.   Talbot Grumbling, RN

## 2020-12-09 ENCOUNTER — Inpatient Hospital Stay: Payer: Medicare Other | Admitting: Nurse Practitioner

## 2020-12-16 ENCOUNTER — Other Ambulatory Visit: Payer: Self-pay

## 2020-12-16 MED ORDER — PANTOPRAZOLE SODIUM 40 MG PO TBEC: 40.0000 mg | DELAYED_RELEASE_TABLET | Freq: Every day | ORAL | 0 refills | Status: DC

## 2020-12-16 MED ORDER — METOPROLOL TARTRATE 25 MG PO TABS: 25.0000 mg | ORAL_TABLET | Freq: Two times a day (BID) | ORAL | 0 refills | Status: DC

## 2020-12-16 MED ORDER — FERROUS SULFATE 325 (65 FE) MG PO TABS: 325.0000 mg | ORAL_TABLET | Freq: Every day | ORAL | 0 refills | Status: DC

## 2020-12-16 NOTE — Telephone Encounter (Signed)
Will send refills for one month.  Needs to establish care here.

## 2020-12-16 NOTE — Telephone Encounter (Signed)
[name] calls nurse line stating they have filled out the new patient packet and are waiting to be scheduled with [facility]. [name] reports he needs refills on Metoprolol, Pantoprazole, and Ferrous Sulfate. Please advise on refills.

## 2020-12-19 ENCOUNTER — Encounter: Payer: Self-pay | Admitting: Nurse Practitioner

## 2020-12-19 ENCOUNTER — Other Ambulatory Visit: Payer: Self-pay

## 2020-12-19 ENCOUNTER — Inpatient Hospital Stay (HOSPITAL_BASED_OUTPATIENT_CLINIC_OR_DEPARTMENT_OTHER): Payer: Medicare Other | Admitting: Nurse Practitioner

## 2020-12-19 ENCOUNTER — Inpatient Hospital Stay: Payer: Medicare Other

## 2020-12-19 VITALS — BP 108/66 | HR 68 | Temp 97.4°F | Resp 18 | Wt 147.8 lb

## 2020-12-19 DIAGNOSIS — C187 Malignant neoplasm of sigmoid colon: Secondary | ICD-10-CM

## 2020-12-19 DIAGNOSIS — C182 Malignant neoplasm of ascending colon: Secondary | ICD-10-CM | POA: Diagnosis not present

## 2020-12-19 LAB — CBC WITH DIFFERENTIAL (CANCER CENTER ONLY)
Abs Immature Granulocytes: 0.02 10*3/uL (ref 0.00–0.07)
Basophils Absolute: 0 10*3/uL (ref 0.0–0.1)
Basophils Relative: 1 %
Eosinophils Absolute: 0 10*3/uL (ref 0.0–0.5)
Eosinophils Relative: 1 %
HCT: 30.4 % — ABNORMAL LOW (ref 39.0–52.0)
Hemoglobin: 9.4 g/dL — ABNORMAL LOW (ref 13.0–17.0)
Immature Granulocytes: 1 %
Lymphocytes Relative: 21 %
Lymphs Abs: 0.8 10*3/uL (ref 0.7–4.0)
MCH: 27.4 pg (ref 26.0–34.0)
MCHC: 30.9 g/dL (ref 30.0–36.0)
MCV: 88.6 fL (ref 80.0–100.0)
Monocytes Absolute: 0.4 10*3/uL (ref 0.1–1.0)
Monocytes Relative: 9 %
Neutro Abs: 2.6 10*3/uL (ref 1.7–7.7)
Neutrophils Relative %: 67 %
Platelet Count: 79 10*3/uL — ABNORMAL LOW (ref 150–400)
RBC: 3.43 MIL/uL — ABNORMAL LOW (ref 4.22–5.81)
RDW: 14.9 % (ref 11.5–15.5)
WBC Count: 3.8 10*3/uL — ABNORMAL LOW (ref 4.0–10.5)
nRBC: 0 % (ref 0.0–0.2)

## 2020-12-19 LAB — SAMPLE TO BLOOD BANK

## 2020-12-19 MED ORDER — CAPECITABINE 500 MG PO TABS
ORAL_TABLET | ORAL | 0 refills | Status: DC
  Filled 2020-12-19: qty 84, fill #0

## 2020-12-19 NOTE — Progress Notes (Addendum)
[place] OFFICE PROGRESS NOTE   Diagnosis: Colon cancer  INTERVAL HISTORY:   [name] returns as scheduled.  He reports feeling well.  He has a good appetite.  He denies nausea.  Colostomy functioning normally.  Objective:  Vital signs in last 24 hours:  Blood pressure 108/66, pulse 68, temperature (!) 97.4 F (36.3 C), temperature source Temporal, resp. rate 18, weight 147 lb 12.8 oz (67 kg), SpO2 99 %.   Resp: Lungs clear bilaterally. Cardio: Irregular. GI: Left lower quadrant colostomy.  Collection bag is empty.  Midline incision with a few areas of scab, otherwise healed.  No hepatosplenomegaly. Vascular: No leg edema. Skin: Raised malignant appearing mass dorsum of the left hand.  Bandage in place at the left forearm.   Lab Results:  Lab Results  Component Value Date   WBC 3.8 (L) 12/19/2020   HGB 9.4 (L) 12/19/2020   HCT 30.4 (L) 12/19/2020   MCV 88.6 12/19/2020   PLT 79 (L) 12/19/2020   NEUTROABS 2.6 12/19/2020    Imaging:  No results found.  Medications: I have reviewed the patient's current medications.  Assessment/Plan: Sigmoid colon adenocarcinoma -Colonoscopy 10/21/2020- partially obstructing mass found in the sigmoid colon consistent with adenocarcinoma. -CEA on 10/22/2020 was 3.9 -CTs 10/22/2020-colonic mass with pelvic lymphadenopathy including right common iliac and external iliac nodes, small pulmonary nodules -10/24/2020-sigmoid colectomy, end colostomy, tumor stuck to pelvic sidewall with rind of tissue remaining at completion of surgery -Pathology- moderately differentiated adenocarcinoma the sigmoid colon, tumor extends into pericolonic connective tissue, no lymphovascular perineural invasion, 0/14 lymph nodes, carcinoma involves an area with the specimen was disrupted at mesenteric margin; mismatch repair protein (IHC) normal; MSI stable -CTs 11/24/2020-interval resection of colonic mass, decreased size of previously noted right  pelvic adenopathy, new noted at the right internal/external iliac bifurcation, unchanged pulmonary nodules -Guardant reveal 12/06/2020-ctDNA not detected -Cycle 1 adjuvant Xeloda anticipated start date 01/06/2021 2.  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 3.  Thrombocytopenia 4.  Atrial flutter 5.  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  COPD 7.  Hypertension 8.  Diabetes mellitus 9.  Dementia 10.  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 Martin Majestic who is going to check with some Mohs specialist to see if disease is resectable, they were holding on radiation appointments until this could be explored. -Established care with the wound center on 09/13/2019 12.  Admission with GI bleeding 11/20/2020 Endoscopy 11/21/2020-mild prepyloric erosions, gastritis Colonoscopy 11/22/2020-no bleeding source identified Capsule endoscopy 11/22/2020-probable AVM with active oozing in the mid small bowel, 1 other small AVM more distally-both out of reach of enteroscope 11/26/2020 balloon enteroscopy-single nonbleeding AVM in the jejunum treated with argon plasma coagulation      Disposition: [name] appears stable.  He has stage II colon cancer.  Dr. Benay Spice recommends 6 months of adjuvant Xeloda chemotherapy.  We reviewed potential toxicities associated with Xeloda including bone marrow toxicity, nausea, mouth sores, diarrhea, hand-foot syndrome, increased sensitivity to sun, rash.  [name] agrees to proceed.  Anticipate he will begin cycle 1 01/06/2021.  We will see him in follow-up on 01/20/2021.  He will contact the office in the interim with any problems.  Patient seen with Dr. Benay Spice.  Ned Card ANP/GNP-BC   12/19/2020  2:48 PM This was a shared  visit  with Ned Card.  [name] has recovered from the colon surgery.  The circulating tumor DNA result is pending.  We discussed adjuvant therapy with him again today.  The tumor does not have "high risk "features, but there is a question of a positive surgical margin.  He understands the potential benefit from adjuvant capecitabine.  He would like to proceed with a course of adjuvant capecitabine.  We reviewed potential toxicities associated with capecitabine.  I was present for greater than 50% of today's visit.  I performed medical decision making.  Julieanne Manson, MD

## 2020-12-20 ENCOUNTER — Telehealth: Payer: Self-pay

## 2020-12-20 ENCOUNTER — Telehealth: Payer: Self-pay | Admitting: Pharmacy Technician

## 2020-12-20 ENCOUNTER — Other Ambulatory Visit (HOSPITAL_COMMUNITY): Payer: Self-pay

## 2020-12-20 DIAGNOSIS — C187 Malignant neoplasm of sigmoid colon: Secondary | ICD-10-CM

## 2020-12-20 MED ORDER — CAPECITABINE 500 MG PO TABS
825.0000 mg/m2 | ORAL_TABLET | Freq: Two times a day (BID) | ORAL | 0 refills | Status: DC
  Filled 2020-12-20: qty 84, 14d supply, fill #0
  Filled 2020-12-31: qty 84, 21d supply, fill #0

## 2020-12-20 NOTE — Telephone Encounter (Signed)
Oral Oncology Patient Advocate Encounter  Prior Authorization for Xeloda has been approved under Medicare part B.  PA# [id number] Effective dates: 12/20/20 through 05/31/21  Patients co-pay is $18.32  Oral Oncology Clinic will continue to follow.   [place] Patient [place] Phone [phone] Fax [phone] 12/20/2020 11:07 AM

## 2020-12-20 NOTE — Telephone Encounter (Signed)
Oral Oncology Patient Advocate Encounter   Received notification from [facility] that prior authorization for Xeloda is required.   PA submitted on CoverMyMeds Key [id number] Status is pending   Oral Oncology Clinic will continue to follow.  [name] Patient [place] Phone [phone] Fax [phone] 12/20/2020 9:39 AM

## 2020-12-20 NOTE — Telephone Encounter (Signed)
Oral Oncology Pharmacist Encounter  Received new prescription for Xeloda (capecitabine) for the adjuvant treatment of stage II colon cancer, planned duration 6 months. Panning to start treatment with Xeloda (capecitabine) on 01/06/21.   Labs from 12/06/20 (CMP) and 12/19/20 (CBC) assessed, no relevant lab abnormalities. Prescription dose and frequency assessed.   Current medication list in Epic reviewed, one DDIs with capecitabine and pantoprazole identified: Consider discontinuation of pantoprazole and addition of Pepcid (famotidine), if needed.   Evaluated chart and no patient barriers to medication adherence identified.   Prescription has been e-scribed to the [facility] for benefits analysis and approval.  Oral Oncology Clinic will continue to follow for insurance authorization, copayment issues, initial counseling and start date.  Patient agreed to treatment on 12/19/20 per MD documentation.  Benn Moulder, PharmD Pharmacy Resident  12/20/2020 10:29 AM

## 2020-12-23 ENCOUNTER — Telehealth: Payer: Self-pay

## 2020-12-23 ENCOUNTER — Telehealth: Payer: Medicare Other

## 2020-12-23 NOTE — Telephone Encounter (Signed)
   RN Case Manager Care Management   Phone Outreach    12/23/2020 Name: [name] MRN: [id number] DOB: 01-20-1946  [name] is a 75 y.o. year old male who is a primary care patient of Pcp, No .   Telephone outreach was unsuccessful A HIPPA compliant phone message was left for the patient providing contact information and requesting a return call.   Plan:Will route chart to Care Guide to see if patient would like to reschedule phone appointment   Review of patient status, including review of consultants reports, relevant laboratory and other test results, and collaboration with appropriate care team members and the patient's provider was performed as part of comprehensive patient evaluation and provision of care management services.    Lazaro Arms RN, BSN, [facility] Care Management Coordinator [place] Phone: [phone] I Fax: [phone]

## 2020-12-24 ENCOUNTER — Other Ambulatory Visit (HOSPITAL_COMMUNITY): Payer: Self-pay

## 2020-12-24 NOTE — Telephone Encounter (Addendum)
Spoke with daughter in law, April, and she stated that the patient had a supplement insurance.  Patient said he has Medicaid. Verified he does have Medicaid but it only covers his Medicare B Premium.  [place] Patient [place] Phone [phone] Fax [phone] 12/24/2020 2:37 PM

## 2020-12-25 ENCOUNTER — Telehealth: Payer: Self-pay | Admitting: *Deleted

## 2020-12-25 LAB — GUARDANT 360

## 2020-12-25 NOTE — Chronic Care Management (AMB) (Signed)
  Care Management   Note  12/25/2020 Name: [name] MRN: [id number] DOB: 1945/08/09  [name] is a 75 y.o. year old male who is a primary care patient of Pcp, No and is actively engaged with the care management team. I reached out to [name] by phone today to assist with re-scheduling a follow up visit with the RN Case Manager   Patient is no longer a patient of [facility] in process of changing provider to [facility].   [place] Management  Direct Dial: [phone]

## 2020-12-26 ENCOUNTER — Encounter: Payer: Self-pay | Admitting: Oncology

## 2020-12-27 ENCOUNTER — Other Ambulatory Visit (HOSPITAL_COMMUNITY): Payer: Self-pay

## 2020-12-31 ENCOUNTER — Other Ambulatory Visit (HOSPITAL_COMMUNITY): Payer: Self-pay

## 2021-01-01 ENCOUNTER — Other Ambulatory Visit (HOSPITAL_COMMUNITY): Payer: Self-pay

## 2021-01-01 NOTE — Telephone Encounter (Signed)
Oral Oncology Patient Advocate Encounter  I spoke with Mr Hodgkiss on 12/31/20 to set up delivery of Xeloda.  Address verified for shipment.  Xeloda will be filled through [facility] and mailed 01/01/21 for delivery 01/02/21.    [facility] will call 7-10 days before next refill is due to complete adherence call and set up delivery of medication.     [place] Patient [place] Phone [phone] Fax [phone] 01/01/2021 11:56 AM

## 2021-01-06 ENCOUNTER — Telehealth: Payer: Self-pay | Admitting: Pharmacist

## 2021-01-06 NOTE — Telephone Encounter (Signed)
Oral Chemotherapy Pharmacist Encounter  [name] is taking his first dose of Xeloda this morning. His daughter-in-law [name] helps to manage his medication.  Patient Education I spoke with patient and his daughter-in-law [name] for overview of new oral chemotherapy medication: Xeloda (capecitabine) for the adjuvant treatment of stage II colon cancer, planned duration 6 months. Panning to start treatment with Xeloda (capecitabine) on 01/06/21.  Counseled [name] on administration, dosing, side effects, monitoring, drug-food interactions, safe handling, storage, and disposal. Patient will take 3 tablets (1,500 mg total) by mouth 2 (two) times daily after a meal. Take for 14 days, followed by 7 days off. Repeat every 21 days.  Side effects include but not limited to: diarrhea, hand-foot syndrome, mouth sores, edema, decreased wbc, fatigue, N/V.  [name] plans on picking up loperamide for [name] to have on hand.     Reviewed with patient importance of keeping a medication schedule and plan for any missed doses.  After discussion with patient no patient barriers to medication adherence identified.   [name] and [name] voiced understanding and appreciation. All questions answered. Medication handout and calendar provided.  Provided patient with [facility] phone number. Patient knows to call the office with questions or concerns. Oral Chemotherapy Navigation Clinic will continue to follow.  Darl Pikes, PharmD, BCPS, BCOP, CPP Hematology/Oncology Clinical Pharmacist Practitioner [facility]/HP/AP [facility] [phone]  01/06/2021 11:11 AM

## 2021-01-08 ENCOUNTER — Telehealth: Payer: Self-pay | Admitting: Pharmacy Technician

## 2021-01-08 ENCOUNTER — Other Ambulatory Visit (HOSPITAL_COMMUNITY): Payer: Self-pay

## 2021-01-08 NOTE — Telephone Encounter (Signed)
Oral Oncology Patient Advocate Encounter   Was successful in securing patient an $2,400 grant from Patient [facility]) to provide copayment coverage for Xeloda.  This will keep the out of pocket expense at $0.     I have spoken with the patient's daughter in law, April.    The billing information is as follows and has been shared with [place].   Member ID: [id number] Group ID: [id number] RxBin: [id number] Dates of Eligibility: 10/10/20 through 01/07/22  Fund:  [place] Patient [place] Phone [phone] Fax [phone] 01/08/2021 2:30 PM

## 2021-01-16 ENCOUNTER — Telehealth: Payer: Self-pay

## 2021-01-16 NOTE — Telephone Encounter (Signed)
TC from Pt's daughter in law [name] who helps Pt take his medication. Pt's daughter in law stated she was suppose to receive a calendar showing the dates when Pt is suppose to take his medication. Looked in previous notes from [facility] who spoke with [name] about how to take the medication. Confirmed Pt started medication on 01/06/21. Informed Pt's daughter in law that Pt is suppose to take the medication for 14 days and off for 7. Informed her that if Pt started on 01/06/21 that he will take the last dose on 01/19/21. And he will be off for 7 days starting 01/20/21 Informed her that Pt has an appointment on 01/21/21 which she can receive a calendar on how many days Pt should be on medication. [name] verbalized understanding no further problems or concerns noted.

## 2021-01-20 ENCOUNTER — Telehealth: Payer: Self-pay | Admitting: *Deleted

## 2021-01-20 ENCOUNTER — Ambulatory Visit: Payer: Medicare Other | Admitting: Family Medicine

## 2021-01-20 ENCOUNTER — Other Ambulatory Visit (HOSPITAL_COMMUNITY): Payer: Self-pay

## 2021-01-20 ENCOUNTER — Other Ambulatory Visit: Payer: Self-pay | Admitting: Oncology

## 2021-01-20 DIAGNOSIS — C187 Malignant neoplasm of sigmoid colon: Secondary | ICD-10-CM

## 2021-01-20 MED ORDER — CAPECITABINE 500 MG PO TABS
825.0000 mg/m2 | ORAL_TABLET | Freq: Two times a day (BID) | ORAL | 0 refills | Status: DC
  Filled 2021-01-20: qty 84, 21d supply, fill #0

## 2021-01-20 NOTE — Telephone Encounter (Signed)
-----   Message from [facility] sent at 01/16/2021 10:10 AM EDT ----- April Mitchell called in for patient . Says he is her step dad. Says he has been Customer service manager and someone was suppose to mail her a calender for when to stop and when to start the medication. Didn't specify what medication but said they were suppose to tell her when to give it to him and when to stop . Please call and advise.   Aprils number and home number listed:  [phone] or [phone]  I did not see that she was a contact for him but she gave the correct information for him.

## 2021-01-20 NOTE — Telephone Encounter (Signed)
Called pt to discuss medication restart and stop also made pt aware of appt for 8/23. Pt stated transportation has been arranged. Pt verbalized understanding.

## 2021-01-21 ENCOUNTER — Other Ambulatory Visit: Payer: Self-pay

## 2021-01-21 ENCOUNTER — Inpatient Hospital Stay: Payer: Medicare Other | Attending: Nurse Practitioner | Admitting: Oncology

## 2021-01-21 ENCOUNTER — Inpatient Hospital Stay: Payer: Medicare Other

## 2021-01-21 VITALS — BP 117/77 | HR 109 | Temp 97.8°F | Resp 20 | Ht 72.0 in | Wt 150.0 lb

## 2021-01-21 DIAGNOSIS — C182 Malignant neoplasm of ascending colon: Secondary | ICD-10-CM | POA: Insufficient documentation

## 2021-01-21 DIAGNOSIS — Z933 Colostomy status: Secondary | ICD-10-CM | POA: Diagnosis not present

## 2021-01-21 DIAGNOSIS — C187 Malignant neoplasm of sigmoid colon: Secondary | ICD-10-CM | POA: Diagnosis not present

## 2021-01-21 DIAGNOSIS — J449 Chronic obstructive pulmonary disease, unspecified: Secondary | ICD-10-CM | POA: Diagnosis not present

## 2021-01-21 DIAGNOSIS — E119 Type 2 diabetes mellitus without complications: Secondary | ICD-10-CM | POA: Diagnosis not present

## 2021-01-21 DIAGNOSIS — F0390 Unspecified dementia without behavioral disturbance: Secondary | ICD-10-CM | POA: Diagnosis not present

## 2021-01-21 DIAGNOSIS — D509 Iron deficiency anemia, unspecified: Secondary | ICD-10-CM | POA: Insufficient documentation

## 2021-01-21 DIAGNOSIS — F17210 Nicotine dependence, cigarettes, uncomplicated: Secondary | ICD-10-CM | POA: Insufficient documentation

## 2021-01-21 DIAGNOSIS — D696 Thrombocytopenia, unspecified: Secondary | ICD-10-CM | POA: Diagnosis not present

## 2021-01-21 DIAGNOSIS — I4892 Unspecified atrial flutter: Secondary | ICD-10-CM | POA: Diagnosis not present

## 2021-01-21 DIAGNOSIS — Z85828 Personal history of other malignant neoplasm of skin: Secondary | ICD-10-CM | POA: Insufficient documentation

## 2021-01-21 DIAGNOSIS — I10 Essential (primary) hypertension: Secondary | ICD-10-CM | POA: Diagnosis not present

## 2021-01-21 LAB — CBC WITH DIFFERENTIAL (CANCER CENTER ONLY)
Abs Immature Granulocytes: 0.03 10*3/uL (ref 0.00–0.07)
Basophils Absolute: 0 10*3/uL (ref 0.0–0.1)
Basophils Relative: 0 %
Eosinophils Absolute: 0 10*3/uL (ref 0.0–0.5)
Eosinophils Relative: 0 %
HCT: 34.7 % — ABNORMAL LOW (ref 39.0–52.0)
Hemoglobin: 10.7 g/dL — ABNORMAL LOW (ref 13.0–17.0)
Immature Granulocytes: 1 %
Lymphocytes Relative: 24 %
Lymphs Abs: 0.9 10*3/uL (ref 0.7–4.0)
MCH: 26.4 pg (ref 26.0–34.0)
MCHC: 30.8 g/dL (ref 30.0–36.0)
MCV: 85.5 fL (ref 80.0–100.0)
Monocytes Absolute: 0.4 10*3/uL (ref 0.1–1.0)
Monocytes Relative: 12 %
Neutro Abs: 2.3 10*3/uL (ref 1.7–7.7)
Neutrophils Relative %: 63 %
Platelet Count: 59 10*3/uL — ABNORMAL LOW (ref 150–400)
RBC: 4.06 MIL/uL — ABNORMAL LOW (ref 4.22–5.81)
RDW: 16.4 % — ABNORMAL HIGH (ref 11.5–15.5)
WBC Count: 3.7 10*3/uL — ABNORMAL LOW (ref 4.0–10.5)
nRBC: 0 % (ref 0.0–0.2)

## 2021-01-21 LAB — CMP (CANCER CENTER ONLY)
ALT: 8 U/L (ref 0–44)
AST: 12 U/L — ABNORMAL LOW (ref 15–41)
Albumin: 3.6 g/dL (ref 3.5–5.0)
Alkaline Phosphatase: 60 U/L (ref 38–126)
Anion gap: 6 (ref 5–15)
BUN: 20 mg/dL (ref 8–23)
CO2: 29 mmol/L (ref 22–32)
Calcium: 9.3 mg/dL (ref 8.9–10.3)
Chloride: 104 mmol/L (ref 98–111)
Creatinine: 1.07 mg/dL (ref 0.61–1.24)
GFR, Estimated: >60 mL/min (ref >60)
Glucose, Bld: 98 mg/dL (ref 70–99)
Potassium: 4.4 mmol/L (ref 3.5–5.1)
Sodium: 139 mmol/L (ref 135–145)
Total Bilirubin: 0.6 mg/dL (ref 0.3–1.2)
Total Protein: 7.6 g/dL (ref 6.5–8.1)

## 2021-01-21 NOTE — Progress Notes (Signed)
[place] OFFICE PROGRESS NOTE   Diagnosis: Colon cancer  INTERVAL HISTORY:   [name] began cycle 1 capecitabine on 01/06/2021.  No mouth sores, nausea, or hand/foot pain.  He reports "soft "stool.  He empties the colostomy approximately 3 times daily as compared to 2 times daily prior to beginning capecitabine.  Good appetite.  No other complaint.  No dyspnea.  Objective:  Vital signs in last 24 hours:  Blood pressure 117/77, pulse (!) 109, temperature 97.8 F (36.6 C), temperature source Oral, resp. rate 20, height 6' (1.829 m), weight 150 lb (68 kg), SpO2 99 %.    HEENT: No thrush or ulcers Resp: Inspiratory/expiratory rhonchi at the right upper posterior chest, no respiratory distress Cardio: Tachycardic, irregular GI: No hepatomegaly, left lower quadrant colostomy with formed brown stool, healed midline incision Vascular: No leg edema Skin: Palms without erythema  Portacath/PICC-without erythema  Lab Results:  Lab Results  Component Value Date   WBC 3.8 (L) 12/19/2020   HGB 9.4 (L) 12/19/2020   HCT 30.4 (L) 12/19/2020   MCV 88.6 12/19/2020   PLT 79 (L) 12/19/2020   NEUTROABS 2.6 12/19/2020    CMP  Lab Results  Component Value Date   NA 138 12/06/2020   K 4.3 12/06/2020   CL 104 12/06/2020   CO2 29 12/06/2020   GLUCOSE 116 (H) 12/06/2020   BUN 20 12/06/2020   CREATININE 0.95 12/06/2020   CALCIUM 8.9 12/06/2020   PROT 6.5 12/06/2020   ALBUMIN 3.2 (L) 12/06/2020   AST 9 (L) 12/06/2020   ALT 10 12/06/2020   ALKPHOS 62 12/06/2020   BILITOT 0.6 12/06/2020   GFRNONAA >60 12/06/2020   GFRAA 79 01/29/2020    Lab Results  Component Value Date   CEA1 3.9 10/22/2020   Medications: I have reviewed the patient's current medications.   Assessment/Plan: Sigmoid colon adenocarcinoma -Colonoscopy 10/21/2020- partially obstructing mass found in the sigmoid colon consistent with adenocarcinoma. -CEA on 10/22/2020 was 3.9 -CTs 10/22/2020-colonic  mass with pelvic lymphadenopathy including right common iliac and external iliac nodes, small pulmonary nodules -10/24/2020-sigmoid colectomy, end colostomy, tumor stuck to pelvic sidewall with rind of tissue remaining at completion of surgery -Pathology- moderately differentiated adenocarcinoma the sigmoid colon, tumor extends into pericolonic connective tissue, no lymphovascular perineural invasion, 0/14 lymph nodes, carcinoma involves an area with the specimen was disrupted at mesenteric margin; mismatch repair protein (IHC) normal; MSI stable -CTs 11/24/2020-interval resection of colonic mass, decreased size of previously noted right pelvic adenopathy, new noted at the right internal/external iliac bifurcation, unchanged pulmonary nodules -Guardant reveal 12/06/2020-ctDNA not detected -Cycle 1 adjuvant Xeloda anticipated 01/06/2021 2.  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 3.  Thrombocytopenia 4.  Atrial flutter 5.  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  COPD 7.  Hypertension 8.  Diabetes mellitus 9.  Dementia 10.  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 Martin Majestic who is going to check with some Mohs specialist to see if disease is resectable, they were holding on radiation appointments until this could be explored. -Established care with the wound center on 09/13/2019 12.  Admission with GI bleeding 11/20/2020 Endoscopy 11/21/2020-mild prepyloric erosions, gastritis Colonoscopy 11/22/2020-no bleeding source identified Capsule endoscopy 11/22/2020-probable AVM with active oozing in the mid small bowel, 1 other small AVM more distally-both out of  reach of enteroscope 11/26/2020 balloon enteroscopy-single nonbleeding AVM in the jejunum treated with  argon plasma coagulation        Disposition: [name] tolerated the first cycle of Xeloda well.  The platelet count is lower today.  He will return for an office visit and CBC on 01/27/2021.  The etiology of the thrombocytopenia is unclear.  This predates capecitabine.  We will review the peripheral blood smear on 01/27/2021.  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  [name] knows not to resume Xeloda until after he is seen on 01/27/2021.    Betsy Coder, MD  01/21/2021  2:32 PM

## 2021-01-22 ENCOUNTER — Other Ambulatory Visit (HOSPITAL_COMMUNITY): Payer: Self-pay

## 2021-01-27 ENCOUNTER — Other Ambulatory Visit: Payer: Self-pay

## 2021-01-27 ENCOUNTER — Inpatient Hospital Stay: Payer: Medicare Other

## 2021-01-27 ENCOUNTER — Inpatient Hospital Stay (HOSPITAL_BASED_OUTPATIENT_CLINIC_OR_DEPARTMENT_OTHER): Payer: Medicare Other | Admitting: Oncology

## 2021-01-27 VITALS — BP 108/71 | HR 60 | Temp 98.1°F | Resp 18 | Ht 74.0 in | Wt 152.0 lb

## 2021-01-27 DIAGNOSIS — C182 Malignant neoplasm of ascending colon: Secondary | ICD-10-CM | POA: Diagnosis not present

## 2021-01-27 DIAGNOSIS — C187 Malignant neoplasm of sigmoid colon: Secondary | ICD-10-CM

## 2021-01-27 LAB — CBC WITH DIFFERENTIAL (CANCER CENTER ONLY)
Abs Immature Granulocytes: 0.02 10*3/uL (ref 0.00–0.07)
Basophils Absolute: 0 10*3/uL (ref 0.0–0.1)
Basophils Relative: 0 %
Eosinophils Absolute: 0 10*3/uL (ref 0.0–0.5)
Eosinophils Relative: 0 %
HCT: 30.9 % — ABNORMAL LOW (ref 39.0–52.0)
Hemoglobin: 9.7 g/dL — ABNORMAL LOW (ref 13.0–17.0)
Immature Granulocytes: 1 %
Lymphocytes Relative: 24 %
Lymphs Abs: 0.9 10*3/uL (ref 0.7–4.0)
MCH: 26.6 pg (ref 26.0–34.0)
MCHC: 31.4 g/dL (ref 30.0–36.0)
MCV: 84.7 fL (ref 80.0–100.0)
Monocytes Absolute: 0.6 10*3/uL (ref 0.1–1.0)
Monocytes Relative: 14 %
Neutro Abs: 2.4 10*3/uL (ref 1.7–7.7)
Neutrophils Relative %: 61 %
Platelet Count: 50 10*3/uL — ABNORMAL LOW (ref 150–400)
RBC: 3.65 MIL/uL — ABNORMAL LOW (ref 4.22–5.81)
RDW: 16.3 % — ABNORMAL HIGH (ref 11.5–15.5)
WBC Count: 4 10*3/uL (ref 4.0–10.5)
nRBC: 0 % (ref 0.0–0.2)

## 2021-01-27 LAB — SAVE SMEAR(SSMR), FOR PROVIDER SLIDE REVIEW

## 2021-01-27 MED ORDER — CAPECITABINE 500 MG PO TABS: ORAL_TABLET | ORAL | 0 refills | Status: DC

## 2021-01-27 NOTE — Progress Notes (Signed)
[place] OFFICE PROGRESS NOTE   Diagnosis: Colon cancer  INTERVAL HISTORY:   [name] returns as scheduled.  No diarrhea.  He bruises easily.  No other bleeding.  No other complaint.  Objective:  Vital signs in last 24 hours:  Blood pressure 108/71, pulse 60, temperature 98.1 F (36.7 C), temperature source Oral, resp. rate 18, height 6' 2" (1.88 m), weight 152 lb (68.9 kg), SpO2 98 %.    HEENT: No thrush or ulcers Resp: Lungs clear bilaterally Cardio: Regular rate and rhythm GI: No hepatosplenomegaly, left lower quadrant colostomy-no stool Vascular: No leg edema  Skin: Ecchymoses over the forearms and dorsum of the hands, bandages in place at the left arm  Portacath/PICC-without erythema  Lab Results:  Lab Results  Component Value Date   WBC 4.0 01/27/2021   HGB 9.7 (L) 01/27/2021   HCT 30.9 (L) 01/27/2021   MCV 84.7 01/27/2021   PLT 50 (L) 01/27/2021   NEUTROABS 2.4 01/27/2021   Blood smear: The platelets are decreased in number, no platelet clumps, the platelets are small.  Numerous ovalocytes, acanthocytes, teardrops, and a few "cigar "cells.  The white cell morphology is unremarkable.  The polychromasia is not increased. CMP  Lab Results  Component Value Date   NA 139 01/21/2021   K 4.4 01/21/2021   CL 104 01/21/2021   CO2 29 01/21/2021   GLUCOSE 98 01/21/2021   BUN 20 01/21/2021   CREATININE 1.07 01/21/2021   CALCIUM 9.3 01/21/2021   PROT 7.6 01/21/2021   ALBUMIN 3.6 01/21/2021   AST 12 (L) 01/21/2021   ALT 8 01/21/2021   ALKPHOS 60 01/21/2021   BILITOT 0.6 01/21/2021   GFRNONAA >60 01/21/2021   GFRAA 79 01/29/2020    Medications: I have reviewed the patient's current medications.   Assessment/Plan: Sigmoid colon adenocarcinoma -Colonoscopy 10/21/2020- partially obstructing mass found in the sigmoid colon consistent with adenocarcinoma. -CEA on 10/22/2020 was 3.9 -CTs 10/22/2020-colonic mass with pelvic lymphadenopathy  including right common iliac and external iliac nodes, small pulmonary nodules -10/24/2020-sigmoid colectomy, end colostomy, tumor stuck to pelvic sidewall with rind of tissue remaining at completion of surgery -Pathology- moderately differentiated adenocarcinoma the sigmoid colon, tumor extends into pericolonic connective tissue, no lymphovascular perineural invasion, 0/14 lymph nodes, carcinoma involves an area with the specimen was disrupted at mesenteric margin; mismatch repair protein (IHC) normal; MSI stable -CTs 11/24/2020-interval resection of colonic mass, decreased size of previously noted right pelvic adenopathy, new noted at the right internal/external iliac bifurcation, unchanged pulmonary nodules -Guardant reveal 12/06/2020-ctDNA not detected -Cycle 1 adjuvant Xeloda anticipated 01/06/2021 -Cycle 2 adjuvant Xeloda 01/27/2021, Xeloda dose reduced secondary to thrombocytopenia 2.  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 3.  Thrombocytopenia-chronic 4.  Atrial flutter 5.  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  COPD 7.  Hypertension 8.  Diabetes mellitus 9.  Dementia 10.  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 Martin Majestic who is going to check with some Mohs specialist to see if disease is resectable, they were holding on radiation appointments until this could be explored. -Established care with the wound center on 09/13/2019 12.  Admission with GI bleeding 11/20/2020 Endoscopy 11/21/2020-mild prepyloric erosions, gastritis Colonoscopy 11/22/2020-no bleeding source identified Capsule endoscopy 11/22/2020-probable AVM with active oozing in the mid small bowel, 1 other small AVM  more distally-both out of reach of enteroscope 11/26/2020 balloon  enteroscopy-single nonbleeding AVM in the jejunum treated with argon plasma coagulation     Disposition: [name] appears stable.  He has persistent thrombocytopenia.  The thrombocytopenia predated capecitabine and was present when he was hospitalized in [place] in 2018.  The etiology of the chronic thrombocytopenia is unclear.  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  He will begin cycle 2 adjuvant capecitabine today with a dose reduction to 1000 mg twice daily.  He will call for bleeding or diarrhea.  [name] will begin a trial of ferrous sulfate for anemia.  Iron deficiency anemia is most commonly associated with thrombocytosis, but thrombocytopenia can occasionally be seen in these patients.  I recommended he discontinue the multivitamin while on capecitabine.  He will return for an office and lab visit in 2 weeks.  Betsy Coder, MD  01/27/2021  3:24 PM

## 2021-01-28 ENCOUNTER — Other Ambulatory Visit (HOSPITAL_COMMUNITY): Payer: Self-pay

## 2021-01-29 ENCOUNTER — Ambulatory Visit (INDEPENDENT_AMBULATORY_CARE_PROVIDER_SITE_OTHER): Payer: Medicare Other

## 2021-01-29 ENCOUNTER — Encounter: Payer: Self-pay | Admitting: Family Medicine

## 2021-01-29 ENCOUNTER — Other Ambulatory Visit: Payer: Self-pay

## 2021-01-29 ENCOUNTER — Ambulatory Visit (INDEPENDENT_AMBULATORY_CARE_PROVIDER_SITE_OTHER): Payer: Medicare Other | Admitting: Family Medicine

## 2021-01-29 VITALS — BP 112/60 | HR 61 | Ht 74.0 in | Wt 152.8 lb

## 2021-01-29 DIAGNOSIS — I4891 Unspecified atrial fibrillation: Secondary | ICD-10-CM

## 2021-01-29 DIAGNOSIS — D500 Iron deficiency anemia secondary to blood loss (chronic): Secondary | ICD-10-CM

## 2021-01-29 DIAGNOSIS — Z720 Tobacco use: Secondary | ICD-10-CM

## 2021-01-29 DIAGNOSIS — Z23 Encounter for immunization: Secondary | ICD-10-CM | POA: Diagnosis present

## 2021-01-29 DIAGNOSIS — Z951 Presence of aortocoronary bypass graft: Secondary | ICD-10-CM

## 2021-01-29 DIAGNOSIS — C187 Malignant neoplasm of sigmoid colon: Secondary | ICD-10-CM

## 2021-01-29 DIAGNOSIS — I480 Paroxysmal atrial fibrillation: Secondary | ICD-10-CM

## 2021-01-29 MED ORDER — FERROUS SULFATE 325 (65 FE) MG PO TABS: 325.0000 mg | ORAL_TABLET | Freq: Every day | ORAL | 3 refills | Status: DC

## 2021-01-29 MED ORDER — METOPROLOL TARTRATE 25 MG PO TABS: 25.0000 mg | ORAL_TABLET | Freq: Two times a day (BID) | ORAL | 3 refills | Status: DC

## 2021-01-29 MED ORDER — ROSUVASTATIN CALCIUM 20 MG PO TABS: 20.0000 mg | ORAL_TABLET | Freq: Every day | ORAL | 3 refills | Status: DC

## 2021-01-29 MED ORDER — PANTOPRAZOLE SODIUM 40 MG PO TBEC
40.0000 mg | DELAYED_RELEASE_TABLET | Freq: Every day | ORAL | 3 refills | Status: AC
Start: 2021-01-29 — End: ?

## 2021-01-29 NOTE — Patient Instructions (Addendum)
I refilled the three medicines that you wanted. I sent in one new cholesterol medication to help your hearrt.   Please cut down on the smoking.  It is the most important thing you could do for your health.   Please be sure you keep your appointment with Dr. Donne Hazel. Please make sure you tell Dr. Benay Spice how you are taking your cancer treatment.   See me in two months.  Sooner if problems

## 2021-01-31 ENCOUNTER — Encounter: Payer: Self-pay | Admitting: Family Medicine

## 2021-01-31 DIAGNOSIS — C189 Malignant neoplasm of colon, unspecified: Secondary | ICD-10-CM | POA: Insufficient documentation

## 2021-01-31 DIAGNOSIS — Z720 Tobacco use: Secondary | ICD-10-CM | POA: Insufficient documentation

## 2021-01-31 NOTE — Assessment & Plan Note (Signed)
Started on statin.  If he is really not going to take his eliquis, he would likely benefit from low dose aspirin.  Will address next visit.

## 2021-01-31 NOTE — Assessment & Plan Note (Signed)
Advised to quit smoking °

## 2021-01-31 NOTE — Assessment & Plan Note (Signed)
Improving with iron therapy and treatment of colon cancer.  Refilled iron.

## 2021-01-31 NOTE — Progress Notes (Signed)
    SUBJECTIVE:   CHIEF COMPLAINT / HPI:   New patient, establishing care.  I treat his son and the resemblence is amazing.  Problems: Colon cancer diagnosed in the spring 2022.  Had surgery and diverting colostomy.  On chemo and followed by Dr. Benay Spice.  (See polypharm below)  Surgeon was Dr. Donne Hazel.  Unsure when he will get his colostomy revised.   Polypharm  Brings in three meds and asks for refills.  He seems to have limited medical literacy, which makes me unclear what he is taking. It sounds like he is taking his chemo but not as prescribe (one pill twice a day.)  Not taking apixiban ("I ain't taking it, it made me bleed.)  Unclear about other meds CAD, S/P CABG.  Not on statin or antiplatelet.  No chest pain. Tobacco abuse.  Continues to smoke Recent labs and wt improving.  Seems to be recovering from the major surgery.    PERTINENT  PMH / PSH: Did not bring in current meds.  OBJECTIVE:   BP 112/60   Pulse 61   Ht '6\' 2"'$  (1.88 m)   Wt 152 lb 12.8 oz (69.3 kg)   SpO2 95%   BMI 19.62 kg/m   Lung clear -prolonged exp phase Cardiac RRR without m or g Abd, Colostomy bag.  Otherwise benign  ASSESSMENT/PLAN:   PAF (paroxysmal atrial fibrillation) (HCC) Seems to be in sinus now.  Assume PAF.  Doubt he will take eliquis.  Talked about stroke risk.  Iron deficiency anemia due to chronic blood loss Improving with iron therapy and treatment of colon cancer.  Refilled iron.  Tobacco abuse Advised to quit smoking.  Colon cancer ([place]) Followed by Benay Spice - not currently taking full dose of chemo. Followed by Donne Hazel - suspect he is becoming a candidate to have colostomy taken down. Encouraged FU with both.  Hx of CABG Started on statin.  If he is really not going to take his eliquis, he would likely benefit from low dose aspirin.  Will address next visit.     Zenia Resides, MD [place]

## 2021-01-31 NOTE — Assessment & Plan Note (Signed)
Seems to be in sinus now.  Assume PAF.  Doubt he will take eliquis.  Talked about stroke risk.

## 2021-01-31 NOTE — Assessment & Plan Note (Signed)
Followed by Benay Spice - not currently taking full dose of chemo. Followed by Donne Hazel - suspect he is becoming a candidate to have colostomy taken down. Encouraged FU with both.

## 2021-02-06 ENCOUNTER — Encounter: Payer: Self-pay | Admitting: *Deleted

## 2021-02-06 NOTE — Progress Notes (Signed)
Call from [place] at [facility]. Working on starting a new medication and need his most recent lab work faxed to [phone]. Labs from 8/29 and 8/23 faxed as requested.

## 2021-02-12 ENCOUNTER — Other Ambulatory Visit (HOSPITAL_COMMUNITY): Payer: Self-pay

## 2021-02-13 ENCOUNTER — Inpatient Hospital Stay: Payer: Medicare Other

## 2021-02-13 ENCOUNTER — Encounter: Payer: Self-pay | Admitting: Nurse Practitioner

## 2021-02-13 ENCOUNTER — Other Ambulatory Visit: Payer: Self-pay

## 2021-02-13 ENCOUNTER — Inpatient Hospital Stay: Payer: Medicare Other | Attending: Nurse Practitioner | Admitting: Nurse Practitioner

## 2021-02-13 VITALS — BP 116/79 | HR 99 | Temp 98.7°F | Resp 18 | Ht 74.0 in | Wt 153.2 lb

## 2021-02-13 DIAGNOSIS — I4892 Unspecified atrial flutter: Secondary | ICD-10-CM | POA: Diagnosis not present

## 2021-02-13 DIAGNOSIS — C182 Malignant neoplasm of ascending colon: Secondary | ICD-10-CM | POA: Diagnosis present

## 2021-02-13 DIAGNOSIS — J449 Chronic obstructive pulmonary disease, unspecified: Secondary | ICD-10-CM | POA: Diagnosis not present

## 2021-02-13 DIAGNOSIS — D509 Iron deficiency anemia, unspecified: Secondary | ICD-10-CM | POA: Insufficient documentation

## 2021-02-13 DIAGNOSIS — E119 Type 2 diabetes mellitus without complications: Secondary | ICD-10-CM | POA: Insufficient documentation

## 2021-02-13 DIAGNOSIS — C187 Malignant neoplasm of sigmoid colon: Secondary | ICD-10-CM | POA: Diagnosis not present

## 2021-02-13 DIAGNOSIS — D696 Thrombocytopenia, unspecified: Secondary | ICD-10-CM | POA: Diagnosis not present

## 2021-02-13 DIAGNOSIS — I10 Essential (primary) hypertension: Secondary | ICD-10-CM | POA: Diagnosis not present

## 2021-02-13 DIAGNOSIS — F0390 Unspecified dementia without behavioral disturbance: Secondary | ICD-10-CM | POA: Diagnosis not present

## 2021-02-13 DIAGNOSIS — F17200 Nicotine dependence, unspecified, uncomplicated: Secondary | ICD-10-CM | POA: Insufficient documentation

## 2021-02-13 LAB — CBC WITH DIFFERENTIAL (CANCER CENTER ONLY)
Abs Immature Granulocytes: 0.03 10*3/uL (ref 0.00–0.07)
Basophils Absolute: 0 10*3/uL (ref 0.0–0.1)
Basophils Relative: 0 %
Eosinophils Absolute: 0 10*3/uL (ref 0.0–0.5)
Eosinophils Relative: 0 %
HCT: 29 % — ABNORMAL LOW (ref 39.0–52.0)
Hemoglobin: 9.3 g/dL — ABNORMAL LOW (ref 13.0–17.0)
Immature Granulocytes: 1 %
Lymphocytes Relative: 25 %
Lymphs Abs: 0.8 10*3/uL (ref 0.7–4.0)
MCH: 26.1 pg (ref 26.0–34.0)
MCHC: 32.1 g/dL (ref 30.0–36.0)
MCV: 81.2 fL (ref 80.0–100.0)
Monocytes Absolute: 0.4 10*3/uL (ref 0.1–1.0)
Monocytes Relative: 12 %
Neutro Abs: 2 10*3/uL (ref 1.7–7.7)
Neutrophils Relative %: 62 %
Platelet Count: 54 10*3/uL — ABNORMAL LOW (ref 150–400)
RBC: 3.57 MIL/uL — ABNORMAL LOW (ref 4.22–5.81)
RDW: 15.3 % (ref 11.5–15.5)
WBC Count: 3.2 10*3/uL — ABNORMAL LOW (ref 4.0–10.5)
nRBC: 0 % (ref 0.0–0.2)

## 2021-02-13 LAB — CMP (CANCER CENTER ONLY)
ALT: 9 U/L (ref 0–44)
AST: 13 U/L — ABNORMAL LOW (ref 15–41)
Albumin: 3.6 g/dL (ref 3.5–5.0)
Alkaline Phosphatase: 58 U/L (ref 38–126)
Anion gap: 8 (ref 5–15)
BUN: 24 mg/dL — ABNORMAL HIGH (ref 8–23)
CO2: 27 mmol/L (ref 22–32)
Calcium: 9.1 mg/dL (ref 8.9–10.3)
Chloride: 105 mmol/L (ref 98–111)
Creatinine: 1.13 mg/dL (ref 0.61–1.24)
GFR, Estimated: >60 mL/min (ref >60)
Glucose, Bld: 87 mg/dL (ref 70–99)
Potassium: 3.9 mmol/L (ref 3.5–5.1)
Sodium: 140 mmol/L (ref 135–145)
Total Bilirubin: 0.6 mg/dL (ref 0.3–1.2)
Total Protein: 7.2 g/dL (ref 6.5–8.1)

## 2021-02-13 NOTE — Progress Notes (Signed)
[place] OFFICE PROGRESS NOTE   Diagnosis: Colon cancer  INTERVAL HISTORY:   [name] returns as scheduled.  He began cycle 2 adjuvant Xeloda 01/27/2021.  The Xeloda was dose reduced secondary to thrombocytopenia.  He reports receiving a phone call last week instructing him to stop chemotherapy.  He is unsure who he spoke with or what day he received a call.  He discontinued Xeloda based on that phone call.  He reports he was taking the Xeloda 1 pill/day.  He feels that taking more than 1 pill a day makes him irritable.  He denies nausea/vomiting.  No mouth sores.  He has occasional loose stools.  No hand or foot pain or redness.  He denies bruising/bleeding.  He reports the left arm bandage is being changed on a weekly basis  Objective:  Vital signs in last 24 hours:  Blood pressure 116/79, pulse 99, temperature 98.7 F (37.1 C), temperature source Oral, resp. rate 18, height '6\' 2"'  (1.88 m), weight 153 lb 3.2 oz (69.5 kg), SpO2 100 %.    HEENT: No thrush or ulcers. Resp: Lungs clear bilaterally. Cardio: Regular rate and rhythm. GI: No hepatosplenomegaly.  Left lower quadrant colostomy.  Collection bag is empty. Vascular: No leg edema.  Skin: Ecchymoses scattered over the right forearm.  Bandage in place over the left forearm.   Lab Results:  Lab Results  Component Value Date   WBC 3.2 (L) 02/13/2021   HGB 9.3 (L) 02/13/2021   HCT 29.0 (L) 02/13/2021   MCV 81.2 02/13/2021   PLT 54 (L) 02/13/2021   NEUTROABS 2.0 02/13/2021    Imaging:  No results found.  Medications: I have reviewed the patient's current medications.  Assessment/Plan: Sigmoid colon adenocarcinoma -Colonoscopy 10/21/2020- partially obstructing mass found in the sigmoid colon consistent with adenocarcinoma. -CEA on 10/22/2020 was 3.9 -CTs 10/22/2020-colonic mass with pelvic lymphadenopathy including right common iliac and external iliac nodes, small pulmonary nodules -10/24/2020-sigmoid  colectomy, end colostomy, tumor stuck to pelvic sidewall with rind of tissue remaining at completion of surgery -Pathology- moderately differentiated adenocarcinoma the sigmoid colon, tumor extends into pericolonic connective tissue, no lymphovascular perineural invasion, 0/14 lymph nodes, carcinoma involves an area with the specimen was disrupted at mesenteric margin; mismatch repair protein (IHC) normal; MSI stable -CTs 11/24/2020-interval resection of colonic mass, decreased size of previously noted right pelvic adenopathy, new noted at the right internal/external iliac bifurcation, unchanged pulmonary nodules -Guardant reveal 12/06/2020-ctDNA not detected -Cycle 1 adjuvant Xeloda anticipated 01/06/2021 -Cycle 2 adjuvant Xeloda 01/27/2021, Xeloda dose reduced secondary to thrombocytopenia (he took 1 pill a day for an unclear amount of time, less than 14 days) 2.  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 3.  Thrombocytopenia-chronic 4.  Atrial flutter 5.  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  COPD 7.  Hypertension 8.  Diabetes mellitus 9.  Dementia 10.  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 Martin Thomas who is going to check with some Mohs specialist to see if disease is resectable, they were holding on radiation appointments until this could be explored. -Established care with the wound center on 09/13/2019 12.  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  He has completed 2 cycles of adjuvant Xeloda.  He discontinued cycle 2 before completing a total of 14 days and has been taking less than prescribed.  We discussed the recommended dose of 2 tablets twice a day and recommended duration of 14 days.  He states he cannot take 2 tablets twice a day but does agree to take 1 tablet twice a day for 14 days.  He will begin cycle 3 on 02/17/2021.  We reviewed the CBC from today.  Platelet count is stable at 54,000.  He understands to stop Xeloda and contact the office with bruising/bleeding.  He will return for lab and follow-up on 03/04/2021.  We are available to see him sooner if needed.  At [name] request I contacted his daughter-in-law [name] and reviewed the above.  She expressed understanding.  She reports [name] is receiving treatment for the left arm skin cancer at the dermatology office on [facility], recently started a new medication.  I contacted [facility] dermatology.  He is a patient there, followed by Dr. Martin Thomas.  I requested Dr. Martin Thomas please contact Dr. Benay Thomas to discuss [name] case.    Ned Card ANP/GNP-BC   02/13/2021  2:19 PM

## 2021-02-14 ENCOUNTER — Other Ambulatory Visit: Payer: Self-pay | Admitting: Oncology

## 2021-02-14 ENCOUNTER — Other Ambulatory Visit (HOSPITAL_COMMUNITY): Payer: Self-pay

## 2021-02-14 DIAGNOSIS — C187 Malignant neoplasm of sigmoid colon: Secondary | ICD-10-CM

## 2021-02-18 ENCOUNTER — Other Ambulatory Visit: Payer: Self-pay | Admitting: Nurse Practitioner

## 2021-02-18 DIAGNOSIS — C187 Malignant neoplasm of sigmoid colon: Secondary | ICD-10-CM

## 2021-02-24 ENCOUNTER — Other Ambulatory Visit (HOSPITAL_COMMUNITY): Payer: Self-pay

## 2021-03-04 ENCOUNTER — Inpatient Hospital Stay: Payer: Medicare Other | Attending: Nurse Practitioner | Admitting: Oncology

## 2021-03-04 ENCOUNTER — Telehealth: Payer: Self-pay

## 2021-03-04 ENCOUNTER — Inpatient Hospital Stay: Payer: Medicare Other

## 2021-03-04 ENCOUNTER — Other Ambulatory Visit: Payer: Self-pay

## 2021-03-04 ENCOUNTER — Other Ambulatory Visit: Payer: Self-pay | Admitting: Family Medicine

## 2021-03-04 VITALS — BP 112/68 | HR 123 | Temp 98.1°F | Resp 18 | Wt 157.0 lb

## 2021-03-04 DIAGNOSIS — R000 Tachycardia, unspecified: Secondary | ICD-10-CM | POA: Insufficient documentation

## 2021-03-04 DIAGNOSIS — J449 Chronic obstructive pulmonary disease, unspecified: Secondary | ICD-10-CM | POA: Diagnosis not present

## 2021-03-04 DIAGNOSIS — C182 Malignant neoplasm of ascending colon: Secondary | ICD-10-CM | POA: Diagnosis not present

## 2021-03-04 DIAGNOSIS — F17210 Nicotine dependence, cigarettes, uncomplicated: Secondary | ICD-10-CM | POA: Diagnosis not present

## 2021-03-04 DIAGNOSIS — D696 Thrombocytopenia, unspecified: Secondary | ICD-10-CM | POA: Diagnosis not present

## 2021-03-04 DIAGNOSIS — E119 Type 2 diabetes mellitus without complications: Secondary | ICD-10-CM | POA: Insufficient documentation

## 2021-03-04 DIAGNOSIS — F0390 Unspecified dementia without behavioral disturbance: Secondary | ICD-10-CM | POA: Insufficient documentation

## 2021-03-04 DIAGNOSIS — C187 Malignant neoplasm of sigmoid colon: Secondary | ICD-10-CM

## 2021-03-04 DIAGNOSIS — I4892 Unspecified atrial flutter: Secondary | ICD-10-CM | POA: Insufficient documentation

## 2021-03-04 DIAGNOSIS — I10 Essential (primary) hypertension: Secondary | ICD-10-CM | POA: Insufficient documentation

## 2021-03-04 DIAGNOSIS — D509 Iron deficiency anemia, unspecified: Secondary | ICD-10-CM | POA: Diagnosis not present

## 2021-03-04 LAB — CBC WITH DIFFERENTIAL (CANCER CENTER ONLY)
Abs Immature Granulocytes: 0.01 10*3/uL (ref 0.00–0.07)
Basophils Absolute: 0 10*3/uL (ref 0.0–0.1)
Basophils Relative: 0 %
Eosinophils Absolute: 0 10*3/uL (ref 0.0–0.5)
Eosinophils Relative: 0 %
HCT: 32.2 % — ABNORMAL LOW (ref 39.0–52.0)
Hemoglobin: 10.1 g/dL — ABNORMAL LOW (ref 13.0–17.0)
Immature Granulocytes: 0 %
Lymphocytes Relative: 22 %
Lymphs Abs: 0.8 10*3/uL (ref 0.7–4.0)
MCH: 26 pg (ref 26.0–34.0)
MCHC: 31.4 g/dL (ref 30.0–36.0)
MCV: 83 fL (ref 80.0–100.0)
Monocytes Absolute: 0.4 10*3/uL (ref 0.1–1.0)
Monocytes Relative: 12 %
Neutro Abs: 2.4 10*3/uL (ref 1.7–7.7)
Neutrophils Relative %: 66 %
Platelet Count: 44 10*3/uL — ABNORMAL LOW (ref 150–400)
RBC: 3.88 MIL/uL — ABNORMAL LOW (ref 4.22–5.81)
RDW: 16.2 % — ABNORMAL HIGH (ref 11.5–15.5)
WBC Count: 3.6 10*3/uL — ABNORMAL LOW (ref 4.0–10.5)
nRBC: 0 % (ref 0.0–0.2)

## 2021-03-04 LAB — CK: Total CK: 38 U/L — ABNORMAL LOW (ref 49–397)

## 2021-03-04 LAB — CMP (CANCER CENTER ONLY)
ALT: 11 U/L (ref 0–44)
AST: 13 U/L — ABNORMAL LOW (ref 15–41)
Albumin: 3.9 g/dL (ref 3.5–5.0)
Alkaline Phosphatase: 63 U/L (ref 38–126)
Anion gap: 7 (ref 5–15)
BUN: 26 mg/dL — ABNORMAL HIGH (ref 8–23)
CO2: 29 mmol/L (ref 22–32)
Calcium: 9.4 mg/dL (ref 8.9–10.3)
Chloride: 105 mmol/L (ref 98–111)
Creatinine: 1.21 mg/dL (ref 0.61–1.24)
GFR, Estimated: >60 mL/min (ref >60)
Glucose, Bld: 97 mg/dL (ref 70–99)
Potassium: 4.5 mmol/L (ref 3.5–5.1)
Sodium: 141 mmol/L (ref 135–145)
Total Bilirubin: 0.7 mg/dL (ref 0.3–1.2)
Total Protein: 7.5 g/dL (ref 6.5–8.1)

## 2021-03-04 NOTE — Telephone Encounter (Addendum)
TC to Pt's daughter in law to ask if Pt has been taking his medication metoprolol. Pt's daughter in law stated he was taking medication. Dr. Benay Spice increased his medication to 50 mg twice a day. Return call from Pt's daughter who stated Pt is out of  the medication. Asked how long Pt has been out of the medication. Pt's daughter in law stated 1 week. Informed Pt's daughter in law to keep the same dose and call his PCP to get a refill asap. Pt's daughter in law verbalized understanding.

## 2021-03-04 NOTE — Progress Notes (Signed)
[place] OFFICE PROGRESS NOTE   Diagnosis: Colon cancer  INTERVAL HISTORY:   [name] returns as scheduled.  He completed another cycle of Xeloda on 03/02/2021.  No mouth sores, diarrhea, or hand/foot pain.  No dyspnea.  He is not taking Odomzo.  The skin cancer at the lower left forearm bleeds intermittently.  Objective:  Vital signs in last 24 hours:  Blood pressure 112/68, pulse (!) 123, temperature 98.1 F (36.7 C), temperature source Oral, resp. rate 18, weight 157 lb (71.2 kg), SpO2 99 %.    HEENT: No thrush or ulcers Resp: Lungs clear bilaterally Cardio: Regular rate and rhythm, tachycardia GI: No hepatosplenomegaly, left lower quadrant colostomy with brown stool Vascular: No leg edema  Skin: 2 separate round/raised/ulcerated lesions at the left forearm, no active bleeding  Lab Results:  Lab Results  Component Value Date   WBC 3.6 (L) 03/04/2021   HGB 10.1 (L) 03/04/2021   HCT 32.2 (L) 03/04/2021   MCV 83.0 03/04/2021   PLT 44 (L) 03/04/2021   NEUTROABS 2.4 03/04/2021    CMP  Lab Results  Component Value Date   NA 141 03/04/2021   K 4.5 03/04/2021   CL 105 03/04/2021   CO2 29 03/04/2021   GLUCOSE 97 03/04/2021   BUN 26 (H) 03/04/2021   CREATININE 1.21 03/04/2021   CALCIUM 9.4 03/04/2021   PROT 7.5 03/04/2021   ALBUMIN 3.9 03/04/2021   AST 13 (L) 03/04/2021   ALT 11 03/04/2021   ALKPHOS 63 03/04/2021   BILITOT 0.7 03/04/2021   GFRNONAA >60 03/04/2021   GFRAA 79 01/29/2020    Lab Results  Component Value Date   CEA1 3.9 10/22/2020   EKG-atrial flutter Medications: I have reviewed the patient's current medications.   Assessment/Plan: Sigmoid colon adenocarcinoma -Colonoscopy 10/21/2020- partially obstructing mass found in the sigmoid colon consistent with adenocarcinoma. -CEA on 10/22/2020 was 3.9 -CTs 10/22/2020-colonic mass with pelvic lymphadenopathy including right common iliac and external iliac nodes, small pulmonary  nodules -10/24/2020-sigmoid colectomy, end colostomy, tumor stuck to pelvic sidewall with rind of tissue remaining at completion of surgery -Pathology- moderately differentiated adenocarcinoma the sigmoid colon, tumor extends into pericolonic connective tissue, no lymphovascular perineural invasion, 0/14 lymph nodes, carcinoma involves an area with the specimen was disrupted at mesenteric margin; mismatch repair protein (IHC) normal; MSI stable -CTs 11/24/2020-interval resection of colonic mass, decreased size of previously noted right pelvic adenopathy, new noted at the right internal/external iliac bifurcation, unchanged pulmonary nodules -Guardant reveal 12/06/2020-ctDNA not detected -Cycle 1 adjuvant Xeloda anticipated 01/06/2021 -Cycle 2 adjuvant Xeloda 01/27/2021, Xeloda dose reduced secondary to thrombocytopenia (he took 1 pill a day for an unclear amount of time, less than 14 days) Cycle 3 adjuvant Xeloda 02/17/2021 2.  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 3.  Thrombocytopenia-chronic 4.  Atrial flutter 5.  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  COPD 7.  Hypertension 8.  Diabetes mellitus 9.  Dementia 10.  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 Martin Majestic who is going to check with some Mohs specialist to see if disease is resectable, they were holding on radiation appointments until this could be explored. -Established care with the wound center on 09/13/2019 12.  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 Crosson completed cycle 3 adjuvant Xeloda beginning 02/17/2021.  He tolerated the Xeloda well.  We confirmed his medical regimen with his daughter-in-law.  He is not taking Odomzo.  We will let Dr. Martin Majestic know he is not taking the [place].  Mr. Mccaffrey has chronic thrombocytopenia of unclear etiology.  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  The platelets are lower today.  He will not resume Xeloda until he returns for a repeat platelet count on 03/10/2021.  He has tachycardia.  An EKG confirms atrial flutter.  I discussed the case with cardiology.  Mr. Hagmann is not a candidate for coagulation therapy in the setting of thrombocytopenia.  We increase the metoprolol dose.  Mr. Mazzocco will return for a lab visit on 03/10/2021.  He will be scheduled for an office visit on 03/31/2021.  Betsy Coder, MD  03/04/2021  12:32 PM

## 2021-03-10 ENCOUNTER — Inpatient Hospital Stay: Payer: Medicare Other

## 2021-03-10 ENCOUNTER — Other Ambulatory Visit (HOSPITAL_COMMUNITY): Payer: Self-pay

## 2021-03-10 ENCOUNTER — Other Ambulatory Visit: Payer: Self-pay

## 2021-03-10 DIAGNOSIS — C187 Malignant neoplasm of sigmoid colon: Secondary | ICD-10-CM

## 2021-03-10 DIAGNOSIS — C182 Malignant neoplasm of ascending colon: Secondary | ICD-10-CM | POA: Diagnosis not present

## 2021-03-10 LAB — CBC WITH DIFFERENTIAL (CANCER CENTER ONLY)
Abs Immature Granulocytes: 0.01 10*3/uL (ref 0.00–0.07)
Basophils Absolute: 0 10*3/uL (ref 0.0–0.1)
Basophils Relative: 0 %
Eosinophils Absolute: 0 10*3/uL (ref 0.0–0.5)
Eosinophils Relative: 0 %
HCT: 34.2 % — ABNORMAL LOW (ref 39.0–52.0)
Hemoglobin: 10.7 g/dL — ABNORMAL LOW (ref 13.0–17.0)
Immature Granulocytes: 0 %
Lymphocytes Relative: 25 %
Lymphs Abs: 0.8 10*3/uL (ref 0.7–4.0)
MCH: 25.8 pg — ABNORMAL LOW (ref 26.0–34.0)
MCHC: 31.3 g/dL (ref 30.0–36.0)
MCV: 82.4 fL (ref 80.0–100.0)
Monocytes Absolute: 0.4 10*3/uL (ref 0.1–1.0)
Monocytes Relative: 13 %
Neutro Abs: 2.1 10*3/uL (ref 1.7–7.7)
Neutrophils Relative %: 62 %
Platelet Count: 43 10*3/uL — ABNORMAL LOW (ref 150–400)
RBC: 4.15 MIL/uL — ABNORMAL LOW (ref 4.22–5.81)
RDW: 16.4 % — ABNORMAL HIGH (ref 11.5–15.5)
WBC Count: 3.3 10*3/uL — ABNORMAL LOW (ref 4.0–10.5)
nRBC: 0 % (ref 0.0–0.2)

## 2021-03-11 ENCOUNTER — Other Ambulatory Visit: Payer: Self-pay | Admitting: Oncology

## 2021-03-11 ENCOUNTER — Other Ambulatory Visit (HOSPITAL_COMMUNITY): Payer: Self-pay

## 2021-03-11 ENCOUNTER — Telehealth: Payer: Self-pay

## 2021-03-11 ENCOUNTER — Other Ambulatory Visit: Payer: Self-pay

## 2021-03-11 DIAGNOSIS — C187 Malignant neoplasm of sigmoid colon: Secondary | ICD-10-CM

## 2021-03-11 NOTE — Telephone Encounter (Signed)
-----   Message from Shawn Pier, MD sent at 03/10/2021  9:36 PM EDT ----- Please call patient, platelets remain low, hold xeloda, schedule repeat cbc 10 days

## 2021-03-11 NOTE — Telephone Encounter (Signed)
Medication on Hold

## 2021-03-11 NOTE — Telephone Encounter (Addendum)
Relayed message to Pt.Informed about holding xeloda, and another lab appointment in 10 days. Message also given to April who also helps Pt with medication.Pt verbalized understanding.Scheduling message sent

## 2021-03-12 ENCOUNTER — Other Ambulatory Visit: Payer: Self-pay | Admitting: Oncology

## 2021-03-12 ENCOUNTER — Other Ambulatory Visit (HOSPITAL_COMMUNITY): Payer: Self-pay

## 2021-03-12 DIAGNOSIS — C187 Malignant neoplasm of sigmoid colon: Secondary | ICD-10-CM

## 2021-03-12 NOTE — Telephone Encounter (Signed)
Medication is on Hold per Dr. Benay Spice

## 2021-03-17 ENCOUNTER — Other Ambulatory Visit (HOSPITAL_COMMUNITY): Payer: Self-pay

## 2021-03-20 ENCOUNTER — Inpatient Hospital Stay: Payer: Medicare Other

## 2021-03-20 ENCOUNTER — Other Ambulatory Visit: Payer: Self-pay

## 2021-03-20 DIAGNOSIS — C182 Malignant neoplasm of ascending colon: Secondary | ICD-10-CM | POA: Diagnosis not present

## 2021-03-20 DIAGNOSIS — C187 Malignant neoplasm of sigmoid colon: Secondary | ICD-10-CM

## 2021-03-20 LAB — CBC WITH DIFFERENTIAL (CANCER CENTER ONLY)
Abs Immature Granulocytes: 0.02 10*3/uL (ref 0.00–0.07)
Basophils Absolute: 0 10*3/uL (ref 0.0–0.1)
Basophils Relative: 0 %
Eosinophils Absolute: 0 10*3/uL (ref 0.0–0.5)
Eosinophils Relative: 0 %
HCT: 35.3 % — ABNORMAL LOW (ref 39.0–52.0)
Hemoglobin: 11.1 g/dL — ABNORMAL LOW (ref 13.0–17.0)
Immature Granulocytes: 1 %
Lymphocytes Relative: 29 %
Lymphs Abs: 0.7 10*3/uL (ref 0.7–4.0)
MCH: 25.8 pg — ABNORMAL LOW (ref 26.0–34.0)
MCHC: 31.4 g/dL (ref 30.0–36.0)
MCV: 81.9 fL (ref 80.0–100.0)
Monocytes Absolute: 0.3 10*3/uL (ref 0.1–1.0)
Monocytes Relative: 13 %
Neutro Abs: 1.4 10*3/uL — ABNORMAL LOW (ref 1.7–7.7)
Neutrophils Relative %: 57 %
Platelet Count: 33 10*3/uL — ABNORMAL LOW (ref 150–400)
RBC: 4.31 MIL/uL (ref 4.22–5.81)
RDW: 16.8 % — ABNORMAL HIGH (ref 11.5–15.5)
WBC Count: 2.5 10*3/uL — ABNORMAL LOW (ref 4.0–10.5)
nRBC: 0 % (ref 0.0–0.2)

## 2021-03-21 ENCOUNTER — Telehealth: Payer: Self-pay

## 2021-03-21 NOTE — Telephone Encounter (Signed)
Called spoke with pt DIL made her aware of most recent lab work,continue to hold xeloda and follow up with appt on 03/31/2021

## 2021-03-21 NOTE — Telephone Encounter (Signed)
-----   Message from Ladell Pier, MD sent at 03/20/2021  7:06 PM EDT ----- Please call patient, platelets remain low, continue to hold xeloda, call for bleeding, f/u as scheduled

## 2021-03-21 NOTE — Telephone Encounter (Signed)
Hold on refill until 10/31

## 2021-03-25 ENCOUNTER — Other Ambulatory Visit (HOSPITAL_COMMUNITY): Payer: Self-pay

## 2021-03-31 ENCOUNTER — Inpatient Hospital Stay: Payer: Medicare Other

## 2021-03-31 ENCOUNTER — Other Ambulatory Visit: Payer: Self-pay

## 2021-03-31 ENCOUNTER — Encounter: Payer: Self-pay | Admitting: Nurse Practitioner

## 2021-03-31 ENCOUNTER — Inpatient Hospital Stay (HOSPITAL_BASED_OUTPATIENT_CLINIC_OR_DEPARTMENT_OTHER): Payer: Medicare Other | Admitting: Nurse Practitioner

## 2021-03-31 VITALS — BP 104/68 | HR 85 | Temp 98.2°F | Resp 18 | Ht 74.0 in | Wt 150.6 lb

## 2021-03-31 DIAGNOSIS — C187 Malignant neoplasm of sigmoid colon: Secondary | ICD-10-CM | POA: Diagnosis not present

## 2021-03-31 DIAGNOSIS — C182 Malignant neoplasm of ascending colon: Secondary | ICD-10-CM | POA: Diagnosis not present

## 2021-03-31 LAB — CBC WITH DIFFERENTIAL (CANCER CENTER ONLY)
Abs Immature Granulocytes: 0.02 10*3/uL (ref 0.00–0.07)
Basophils Absolute: 0 10*3/uL (ref 0.0–0.1)
Basophils Relative: 0 %
Eosinophils Absolute: 0 10*3/uL (ref 0.0–0.5)
Eosinophils Relative: 0 %
HCT: 35.5 % — ABNORMAL LOW (ref 39.0–52.0)
Hemoglobin: 11.2 g/dL — ABNORMAL LOW (ref 13.0–17.0)
Immature Granulocytes: 1 %
Lymphocytes Relative: 26 %
Lymphs Abs: 0.9 10*3/uL (ref 0.7–4.0)
MCH: 25.8 pg — ABNORMAL LOW (ref 26.0–34.0)
MCHC: 31.5 g/dL (ref 30.0–36.0)
MCV: 81.8 fL (ref 80.0–100.0)
Monocytes Absolute: 0.4 10*3/uL (ref 0.1–1.0)
Monocytes Relative: 11 %
Neutro Abs: 2.1 10*3/uL (ref 1.7–7.7)
Neutrophils Relative %: 62 %
Platelet Count: 25 10*3/uL — ABNORMAL LOW (ref 150–400)
RBC: 4.34 MIL/uL (ref 4.22–5.81)
RDW: 16.6 % — ABNORMAL HIGH (ref 11.5–15.5)
WBC Count: 3.3 10*3/uL — ABNORMAL LOW (ref 4.0–10.5)
nRBC: 0 % (ref 0.0–0.2)

## 2021-03-31 LAB — CMP (CANCER CENTER ONLY)
ALT: 15 U/L (ref 0–44)
AST: 17 U/L (ref 15–41)
Albumin: 3.9 g/dL (ref 3.5–5.0)
Alkaline Phosphatase: 68 U/L (ref 38–126)
Anion gap: 9 (ref 5–15)
BUN: 27 mg/dL — ABNORMAL HIGH (ref 8–23)
CO2: 27 mmol/L (ref 22–32)
Calcium: 9.6 mg/dL (ref 8.9–10.3)
Chloride: 105 mmol/L (ref 98–111)
Creatinine: 1.2 mg/dL (ref 0.61–1.24)
GFR, Estimated: >60 mL/min (ref >60)
Glucose, Bld: 102 mg/dL — ABNORMAL HIGH (ref 70–99)
Potassium: 4.5 mmol/L (ref 3.5–5.1)
Sodium: 141 mmol/L (ref 135–145)
Total Bilirubin: 1 mg/dL (ref 0.3–1.2)
Total Protein: 7.8 g/dL (ref 6.5–8.1)

## 2021-03-31 LAB — SAVE SMEAR(SSMR), FOR PROVIDER SLIDE REVIEW

## 2021-03-31 NOTE — Progress Notes (Signed)
[place] OFFICE PROGRESS NOTE   Diagnosis: Colon cancer  INTERVAL HISTORY:   [name] returns as scheduled.  He has completed 3 cycles of adjuvant Xeloda.  Xeloda has been on hold due to thrombocytopenia.  No bleeding except a small amount intermittently from the left forearm wound.  No nausea or vomiting.  Recent constipation.  Resolved with a laxative.  Bowels now moving regularly.  Objective:  Vital signs in last 24 hours:  Blood pressure 104/68, pulse 85, temperature 98.2 F (36.8 C), temperature source Oral, resp. rate 18, height $RemoveBe'6\' 2"'vUEuYZPlM$  (1.88 m), weight 150 lb 9.6 oz (68.3 kg), SpO2 100 %.    HEENT: No thrush or ulcers. Resp: Distant breath sounds.  No respiratory distress. Cardio: Irregular. GI: Abdomen soft and nontender.  No hepatomegaly.  Left lower quadrant colostomy with brown stool in the collection bag. Vascular: No leg edema. Skin: Left forearm bandaged.   Lab Results:  Lab Results  Component Value Date   WBC 3.3 (L) 03/31/2021   HGB 11.2 (L) 03/31/2021   HCT 35.5 (L) 03/31/2021   MCV 81.8 03/31/2021   PLT 25 (L) 03/31/2021   NEUTROABS 2.1 03/31/2021    Imaging:  No results found.  Medications: I have reviewed the patient's current medications.  Assessment/Plan: Sigmoid colon adenocarcinoma -Colonoscopy 10/21/2020- partially obstructing mass found in the sigmoid colon consistent with adenocarcinoma. -CEA on 10/22/2020 was 3.9 -CTs 10/22/2020-colonic mass with pelvic lymphadenopathy including right common iliac and external iliac nodes, small pulmonary nodules -10/24/2020-sigmoid colectomy, end colostomy, tumor stuck to pelvic sidewall with rind of tissue remaining at completion of surgery -Pathology- moderately differentiated adenocarcinoma the sigmoid colon, tumor extends into pericolonic connective tissue, no lymphovascular perineural invasion, 0/14 lymph nodes, carcinoma involves an area with the specimen was disrupted at mesenteric  margin; mismatch repair protein (IHC) normal; MSI stable -CTs 11/24/2020-interval resection of colonic mass, decreased size of previously noted right pelvic adenopathy, new noted at the right internal/external iliac bifurcation, unchanged pulmonary nodules -Guardant reveal 12/06/2020-ctDNA not detected -Cycle 1 adjuvant Xeloda anticipated 01/06/2021 -Cycle 2 adjuvant Xeloda 01/27/2021, Xeloda dose reduced secondary to thrombocytopenia (he took 1 pill a day for an unclear amount of time, less than 14 days) Cycle 3 adjuvant Xeloda 02/17/2021 2.  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 3.  Thrombocytopenia-chronic 4.  Atrial flutter 5.  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  COPD 7.  Hypertension 8.  Diabetes mellitus 9.  Dementia 10.  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 Martin Majestic who is going to check with some Mohs specialist to see if disease is resectable, they were holding on radiation appointments until this could be explored. -Established care with the wound center on 09/13/2019 12.  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  Xeloda remains on hold due to progressive severe thrombocytopenia.  Dr. Benay Spice reviewed today's lab results with him and recommends a bone marrow biopsy.  [name] agrees.  Referral made to interventional radiology.  He  understands to contact  the office with bleeding.  He will return for lab in 1 week, lab and follow-up in 2 weeks.  We are available to see him sooner if needed.  Patient seen with Dr. Benay Spice.  Ned Card ANP/GNP-BC   03/31/2021  1:37 PM  This was a shared visit with Ned Card.  He has persistent severe thrombocytopenia.  He will call for bleeding.  The etiology of the thrombocytopenia is unclear.  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  He agrees to diagnostic bone marrow biopsy.  Xeloda treatment remains on hold.  He is not taking Odomzo.  I was present for greater 50% of today's visit.  I performed medical decision making.  Julieanne Manson, MD

## 2021-04-01 ENCOUNTER — Other Ambulatory Visit (HOSPITAL_COMMUNITY): Payer: Self-pay

## 2021-04-04 NOTE — Telephone Encounter (Signed)
Medication continues with hold until next visit

## 2021-04-07 ENCOUNTER — Other Ambulatory Visit: Payer: Self-pay

## 2021-04-07 ENCOUNTER — Inpatient Hospital Stay: Payer: Medicare Other | Attending: Nurse Practitioner

## 2021-04-07 ENCOUNTER — Telehealth: Payer: Self-pay

## 2021-04-07 DIAGNOSIS — D696 Thrombocytopenia, unspecified: Secondary | ICD-10-CM | POA: Insufficient documentation

## 2021-04-07 DIAGNOSIS — I4891 Unspecified atrial fibrillation: Secondary | ICD-10-CM | POA: Diagnosis not present

## 2021-04-07 DIAGNOSIS — D509 Iron deficiency anemia, unspecified: Secondary | ICD-10-CM | POA: Insufficient documentation

## 2021-04-07 DIAGNOSIS — Z23 Encounter for immunization: Secondary | ICD-10-CM | POA: Diagnosis not present

## 2021-04-07 DIAGNOSIS — E119 Type 2 diabetes mellitus without complications: Secondary | ICD-10-CM | POA: Insufficient documentation

## 2021-04-07 DIAGNOSIS — F0390 Unspecified dementia without behavioral disturbance: Secondary | ICD-10-CM | POA: Diagnosis not present

## 2021-04-07 DIAGNOSIS — I10 Essential (primary) hypertension: Secondary | ICD-10-CM | POA: Diagnosis not present

## 2021-04-07 DIAGNOSIS — F17210 Nicotine dependence, cigarettes, uncomplicated: Secondary | ICD-10-CM | POA: Diagnosis not present

## 2021-04-07 DIAGNOSIS — C182 Malignant neoplasm of ascending colon: Secondary | ICD-10-CM | POA: Diagnosis not present

## 2021-04-07 DIAGNOSIS — Z85828 Personal history of other malignant neoplasm of skin: Secondary | ICD-10-CM | POA: Insufficient documentation

## 2021-04-07 DIAGNOSIS — C187 Malignant neoplasm of sigmoid colon: Secondary | ICD-10-CM

## 2021-04-07 LAB — CBC WITH DIFFERENTIAL (CANCER CENTER ONLY)
Abs Immature Granulocytes: 0.02 10*3/uL (ref 0.00–0.07)
Basophils Absolute: 0 10*3/uL (ref 0.0–0.1)
Basophils Relative: 0 %
Eosinophils Absolute: 0 10*3/uL (ref 0.0–0.5)
Eosinophils Relative: 0 %
HCT: 32.9 % — ABNORMAL LOW (ref 39.0–52.0)
Hemoglobin: 10.6 g/dL — ABNORMAL LOW (ref 13.0–17.0)
Immature Granulocytes: 1 %
Lymphocytes Relative: 29 %
Lymphs Abs: 0.8 10*3/uL (ref 0.7–4.0)
MCH: 26.2 pg (ref 26.0–34.0)
MCHC: 32.2 g/dL (ref 30.0–36.0)
MCV: 81.4 fL (ref 80.0–100.0)
Monocytes Absolute: 0.4 10*3/uL (ref 0.1–1.0)
Monocytes Relative: 13 %
Neutro Abs: 1.5 10*3/uL — ABNORMAL LOW (ref 1.7–7.7)
Neutrophils Relative %: 57 %
Platelet Count: 21 10*3/uL — ABNORMAL LOW (ref 150–400)
RBC: 4.04 MIL/uL — ABNORMAL LOW (ref 4.22–5.81)
RDW: 16.4 % — ABNORMAL HIGH (ref 11.5–15.5)
WBC Count: 2.7 10*3/uL — ABNORMAL LOW (ref 4.0–10.5)
nRBC: 0 % (ref 0.0–0.2)

## 2021-04-07 NOTE — Telephone Encounter (Signed)
TC from [employer] at [facility] inquiring about Mr Dicaprio taking [place]. Crystal stated that Pt informed her that Dr Benay Spice told him to not take ODOMZO informed Crystal that Dr Benay Spice did not tell Pt to stop taking ODOMZO Pt informed Dr Benay Spice at previous visit that he was not taking the medication. Pt was informed to stop taking chemo medication xeloda because of his low platelet count. Crystal verbalized understanding.

## 2021-04-08 ENCOUNTER — Telehealth: Payer: Self-pay | Admitting: *Deleted

## 2021-04-08 DIAGNOSIS — C187 Malignant neoplasm of sigmoid colon: Secondary | ICD-10-CM

## 2021-04-08 NOTE — Telephone Encounter (Addendum)
Per Dr. Benay Spice, his platelets and WBC are continuing to worsen. Needs the 11/29 BMBX moved up to asap. Contacted patient/son and they agree to sooner. Sent message to Garth Bigness in radiology to please move this up asap.  Per scheduling, it could be done sooner if they agree to going to [place] for procedure--patient/son strongly disagree to go to [place]. Made them aware he could be compromising his health by waiting too long. No openings for earlier BMBX. Will add sample to blood bank to appointment on 11/14.

## 2021-04-10 ENCOUNTER — Other Ambulatory Visit (HOSPITAL_COMMUNITY): Payer: Self-pay

## 2021-04-14 ENCOUNTER — Inpatient Hospital Stay: Payer: Medicare Other

## 2021-04-14 ENCOUNTER — Other Ambulatory Visit: Payer: Self-pay

## 2021-04-14 ENCOUNTER — Inpatient Hospital Stay (HOSPITAL_BASED_OUTPATIENT_CLINIC_OR_DEPARTMENT_OTHER): Payer: Medicare Other | Admitting: Nurse Practitioner

## 2021-04-14 ENCOUNTER — Encounter: Payer: Self-pay | Admitting: Nurse Practitioner

## 2021-04-14 ENCOUNTER — Other Ambulatory Visit (HOSPITAL_COMMUNITY): Payer: Self-pay

## 2021-04-14 VITALS — BP 123/65 | HR 90 | Temp 98.7°F | Resp 18 | Ht 74.0 in | Wt 150.2 lb

## 2021-04-14 DIAGNOSIS — Z23 Encounter for immunization: Secondary | ICD-10-CM | POA: Diagnosis not present

## 2021-04-14 DIAGNOSIS — C187 Malignant neoplasm of sigmoid colon: Secondary | ICD-10-CM

## 2021-04-14 DIAGNOSIS — D696 Thrombocytopenia, unspecified: Secondary | ICD-10-CM

## 2021-04-14 DIAGNOSIS — C182 Malignant neoplasm of ascending colon: Secondary | ICD-10-CM | POA: Diagnosis not present

## 2021-04-14 LAB — CBC WITH DIFFERENTIAL (CANCER CENTER ONLY)
Abs Immature Granulocytes: 0.01 10*3/uL (ref 0.00–0.07)
Basophils Absolute: 0 10*3/uL (ref 0.0–0.1)
Basophils Relative: 0 %
Eosinophils Absolute: 0 10*3/uL (ref 0.0–0.5)
Eosinophils Relative: 0 %
HCT: 34.8 % — ABNORMAL LOW (ref 39.0–52.0)
Hemoglobin: 11.2 g/dL — ABNORMAL LOW (ref 13.0–17.0)
Immature Granulocytes: 0 %
Lymphocytes Relative: 26 %
Lymphs Abs: 0.7 10*3/uL (ref 0.7–4.0)
MCH: 26.7 pg (ref 26.0–34.0)
MCHC: 32.2 g/dL (ref 30.0–36.0)
MCV: 82.9 fL (ref 80.0–100.0)
Monocytes Absolute: 0.3 10*3/uL (ref 0.1–1.0)
Monocytes Relative: 10 %
Neutro Abs: 1.7 10*3/uL (ref 1.7–7.7)
Neutrophils Relative %: 64 %
Platelet Count: 23 10*3/uL — ABNORMAL LOW (ref 150–400)
RBC: 4.2 MIL/uL — ABNORMAL LOW (ref 4.22–5.81)
RDW: 16.2 % — ABNORMAL HIGH (ref 11.5–15.5)
WBC Count: 2.6 10*3/uL — ABNORMAL LOW (ref 4.0–10.5)
nRBC: 0 % (ref 0.0–0.2)

## 2021-04-14 MED ORDER — INFLUENZA VAC A&B SA ADJ QUAD 0.5 ML IM PRSY
0.5000 mL | PREFILLED_SYRINGE | Freq: Once | INTRAMUSCULAR | Status: AC
  Administered 2021-04-14: 0.5 mL via INTRAMUSCULAR
  Filled 2021-04-14: qty 0.5

## 2021-04-14 NOTE — Progress Notes (Signed)
Pt is taking Xeloda as prescribe with no side effects reported.

## 2021-04-14 NOTE — Progress Notes (Signed)
[name] OFFICE PROGRESS NOTE   Diagnosis: Colon cancer, thrombocytopenia  INTERVAL HISTORY:   [name] returns as scheduled.  He has no complaints.  Says he feels well.  He denies bleeding except occasionally a small amount at the stoma site.  Objective:  Vital signs in last 24 hours:  Blood pressure 123/65, pulse 90, temperature 98.7 F (37.1 C), temperature source Oral, resp. rate 18, height '6\' 2"'  (1.88 m), weight 150 lb 3.2 oz (68.1 kg), SpO2 100 %.    HEENT: No thrush or ulcers. Resp: Expiratory rhonchi, scattered wheezes.  No respiratory distress. Cardio: Irregular. GI: Abdomen soft and nontender.  No hepatomegaly.  Left lower quadrant colostomy.  Collection bag is empty. Vascular: No leg edema. Skin: Left arm is bandaged.   Lab Results:  Lab Results  Component Value Date   WBC 2.6 (L) 04/14/2021   HGB 11.2 (L) 04/14/2021   HCT 34.8 (L) 04/14/2021   MCV 82.9 04/14/2021   PLT 23 (L) 04/14/2021   NEUTROABS 1.7 04/14/2021    Imaging:  No results found.  Medications: I have reviewed the patient's current medications.  Assessment/Plan: Sigmoid colon adenocarcinoma -Colonoscopy 10/21/2020- partially obstructing mass found in the sigmoid colon consistent with adenocarcinoma. -CEA on 10/22/2020 was 3.9 -CTs 10/22/2020-colonic mass with pelvic lymphadenopathy including right common iliac and external iliac nodes, small pulmonary nodules -10/24/2020-sigmoid colectomy, end colostomy, tumor stuck to pelvic sidewall with rind of tissue remaining at completion of surgery -Pathology- moderately differentiated adenocarcinoma the sigmoid colon, tumor extends into pericolonic connective tissue, no lymphovascular perineural invasion, 0/14 lymph nodes, carcinoma involves an area with the specimen was disrupted at mesenteric margin; mismatch repair protein (IHC) normal; MSI stable -CTs 11/24/2020-interval resection of colonic mass, decreased size of previously  noted right pelvic adenopathy, new noted at the right internal/external iliac bifurcation, unchanged pulmonary nodules -Guardant reveal 12/06/2020-ctDNA not detected -Cycle 1 adjuvant Xeloda anticipated 01/06/2021 -Cycle 2 adjuvant Xeloda 01/27/2021, Xeloda dose reduced secondary to thrombocytopenia (he took 1 pill a day for an unclear amount of time, less than 14 days) Cycle 3 adjuvant Xeloda 02/17/2021 2.  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 3.  Thrombocytopenia-chronic 4.  Atrial flutter 5.  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  COPD 7.  Hypertension 8.  Diabetes mellitus 9.  Dementia 10.  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 Martin Majestic who is going to check with some Mohs specialist to see if disease is resectable, they were holding on radiation appointments until this could be explored. -Established care with the wound center on 09/13/2019 12.  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  Xeloda has been discontinued.  Review of the CBC shows persistent severe thrombocytopenia.  Hemoglobin and white count stable.  Plan for repeat CBC in approximately 10 days.  He understands to contact the office with bleeding.  He is scheduled for a bone marrow biopsy 04/29/2021.  He will return for follow-up 05/08/2021.  Plan reviewed with Dr.  Benay Spice.    Lattie Haw  Sira Adsit ANP/GNP-BC   04/14/2021  2:47 PM

## 2021-04-14 NOTE — Progress Notes (Signed)
Patient tolerated flu vaccine with no complaints voiced.  Site clean and dry with no bruising or swelling noted at site.  Band aid applied.  VSS during treatment and at discharge. Pt left ambulatory with no s/s of distress noted.

## 2021-04-15 ENCOUNTER — Other Ambulatory Visit (HOSPITAL_COMMUNITY): Payer: Self-pay

## 2021-04-15 LAB — SAMPLE TO BLOOD BANK

## 2021-04-21 ENCOUNTER — Telehealth: Payer: Self-pay | Admitting: *Deleted

## 2021-04-21 NOTE — Telephone Encounter (Signed)
Call from son, Aaron Edelman very upset, loud and screaming "His cancer is getting worse". Reports areas are more red and blistered and wants someone to look at him before the holiday. Areas are not bleeding. He states patient does not appear in pain. States it has progressed since last visit on 11/14. Confirmed w/NP that they need to reach out to dermatologist, Dr. Martin Majestic about this issue and provided office number [phone]

## 2021-04-23 ENCOUNTER — Other Ambulatory Visit: Payer: Self-pay

## 2021-04-23 ENCOUNTER — Inpatient Hospital Stay: Payer: Medicare Other

## 2021-04-23 DIAGNOSIS — C182 Malignant neoplasm of ascending colon: Secondary | ICD-10-CM | POA: Diagnosis not present

## 2021-04-23 DIAGNOSIS — C187 Malignant neoplasm of sigmoid colon: Secondary | ICD-10-CM

## 2021-04-23 DIAGNOSIS — D696 Thrombocytopenia, unspecified: Secondary | ICD-10-CM

## 2021-04-23 LAB — CBC WITH DIFFERENTIAL (CANCER CENTER ONLY)
Abs Immature Granulocytes: 0.01 10*3/uL (ref 0.00–0.07)
Basophils Absolute: 0 10*3/uL (ref 0.0–0.1)
Basophils Relative: 0 %
Eosinophils Absolute: 0 10*3/uL (ref 0.0–0.5)
Eosinophils Relative: 0 %
HCT: 35.5 % — ABNORMAL LOW (ref 39.0–52.0)
Hemoglobin: 11.3 g/dL — ABNORMAL LOW (ref 13.0–17.0)
Immature Granulocytes: 0 %
Lymphocytes Relative: 28 %
Lymphs Abs: 0.7 10*3/uL (ref 0.7–4.0)
MCH: 25.9 pg — ABNORMAL LOW (ref 26.0–34.0)
MCHC: 31.8 g/dL (ref 30.0–36.0)
MCV: 81.4 fL (ref 80.0–100.0)
Monocytes Absolute: 0.2 10*3/uL (ref 0.1–1.0)
Monocytes Relative: 9 %
Neutro Abs: 1.6 10*3/uL — ABNORMAL LOW (ref 1.7–7.7)
Neutrophils Relative %: 63 %
Platelet Count: 30 10*3/uL — ABNORMAL LOW (ref 150–400)
RBC: 4.36 MIL/uL (ref 4.22–5.81)
RDW: 15.6 % — ABNORMAL HIGH (ref 11.5–15.5)
WBC Count: 2.6 10*3/uL — ABNORMAL LOW (ref 4.0–10.5)
nRBC: 0 % (ref 0.0–0.2)

## 2021-04-28 NOTE — H&P (Incomplete)
Chief Complaint: Patient was seen in consultation today for image guided bone marrow biopsy and aspiration at the request of [name] K  Referring Physician(s): Shawn Shark  Supervising Physician: [name]  Patient Status: [name]  History of Present Illness: [name] is a 75 y.o. male with PMH of IDA secondary to underlying GI malignancy, A. fib, COPD, dementia, DM, HTN, tobacco abuse, thrombocytopenia and sigmoid colon adenocarcinoma.  Patient had colonoscopy 10/21/2020 that found a partially obstructing mass in the sigmoid colon consistent with adenocarcinoma.  CT on 10/22/2020 confirmed colonic mass within pelvic lymphadenopathy including right common iliac and external iliac nodes and small pulmonary nodules.  Patient recent labs shows persistent severe thrombocytopenia with stable hemoglobin and white count.  Shawn Card, NP has referred patient to IR for bone marrow biopsy for persistent thrombocytopenia.  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[facility])    Hypertension    Thrombocytopenia ([place])     Past Surgical History:  Procedure Laterality Date   BALLOON ENTEROSCOPY N/A 11/26/2020   Procedure: BALLOON ENTEROSCOPY;  Surgeon: Shawn Bullion, DO;  Location: [facility];  Service: Gastroenterology;  Laterality: N/A;  Midday please   BIOPSY  10/21/2020   Procedure: BIOPSY;  Surgeon: Shawn Stabler, MD;  Location: [facility];  Service: Gastroenterology;;   BIOPSY  11/21/2020   Procedure: BIOPSY;  Surgeon: Shawn Mayer, MD;  Location: [facility];  Service: Endoscopy;;   COLONOSCOPY WITH PROPOFOL N/A 11/22/2020   Procedure: COLONOSCOPY WITH PROPOFOL;  Surgeon: Shawn Mayer, MD;  Location: [facility];  Service: Endoscopy;  Laterality: N/A;   CYSTOSCOPY W/ URETERAL STENT PLACEMENT N/A 10/24/2020   Procedure: CYSTOSCOPY WITH RETROGRADE PYELOGRAM/URETERAL STENT PLACEMENT;  Surgeon: Shawn Hughs, MD;  Location: [facility];  Service: Urology;  Laterality: N/A;   CYSTOSCOPY WITH URETHRAL DILATATION  10/24/2020   Procedure: URETHRAL DILATATION;  Surgeon: Shawn Hughs, MD;  Location: [facility];  Service: Urology;;   ENTEROSCOPY N/A 11/21/2020   Procedure: ENTEROSCOPY;  Surgeon: Shawn Mayer, MD;  Location: [facility];  Service: Endoscopy;  Laterality: N/A;   ESOPHAGOGASTRODUODENOSCOPY (EGD) WITH PROPOFOL N/A 10/21/2020   Procedure: ESOPHAGOGASTRODUODENOSCOPY (EGD) WITH PROPOFOL;  Surgeon: Shawn Stabler, MD;  Location: [facility];  Service: Gastroenterology;  Laterality: N/A;   GIVENS CAPSULE STUDY N/A 11/22/2020   Procedure: GIVENS CAPSULE STUDY;  Surgeon: Shawn Mayer, MD;  Location: [facility];  Service: Endoscopy;  Laterality: N/A;   HOT HEMOSTASIS N/A 11/26/2020   Procedure: HOT HEMOSTASIS (ARGON PLASMA COAGULATION/BICAP);  Surgeon: Shawn Bullion, DO;  Location: [facility];  Service: Gastroenterology;  Laterality: N/A;   MITRAL VALVE REPLACEMENT  07/2016   [facility] for endocarditis   PARTIAL COLECTOMY N/A 10/24/2020   Procedure: OPEN PARTIAL COLECTOMY WTH COLOSTOMY;  Surgeon: Shawn Bookbinder, MD;  Location: [facility];  Service: General;  Laterality: N/A;   SIGMOIDOSCOPY  10/21/2020   Procedure: SIGMOIDOSCOPY;  Surgeon: Shawn Stabler, MD;  Location: [place];  Service: Gastroenterology;;   SUBMUCOSAL INJECTION  10/21/2020   Procedure: SUBMUCOSAL INJECTION;  Surgeon: Shawn Stabler, MD;  Location:  [facility];  Service: Gastroenterology;;  SPOT   SUBMUCOSAL TATTOO INJECTION  11/26/2020   Procedure: SUBMUCOSAL TATTOO INJECTION;  Surgeon: Shawn Bullion, DO;  Location: [facility];  Service: Gastroenterology;;    Allergies: Orange juice  [name] oil]  Medications: Prior to Admission medications   Medication Sig Start Date End Date Taking? Authorizing Provider  acetaminophen (TYLENOL) 325 MG tablet Take 2 tablets (650 mg total) by mouth every 6 (six) hours as needed for mild pain or fever. 11/01/20   Norm Parcel, PA-C  albuterol (VENTOLIN HFA) 108 (90 Base) MCG/ACT inhaler Inhale 1-2 puffs into the lungs every 6 (six) hours as needed for wheezing or shortness of breath. 11/01/20   Hongalgi, Lenis Dickinson, MD  apixaban (ELIQUIS) 5 MG TABS tablet Take 1 tablet (5 mg total) by mouth 2 (two) times daily. Patient not taking: No sig reported 11/01/20   Modena Jansky, MD  capecitabine (XELODA) 500 MG tablet Take 2 tabs(1000 mg) in the am. And 2 tabs(1000 mg) in the evening  twice a day for 14 days on and 7 days off. 01/27/21   Ladell Pier, MD  diltiazem (CARDIZEM CD) 120 MG 24 hr capsule TAKE 1 CAPSULE(120 MG) BY MOUTH AT BEDTIME 03/04/21   Zenia Resides, MD  ferrous sulfate 325 (65 FE) MG tablet Take 1 tablet (325 mg total) by mouth daily. 01/29/21 02/28/21  Zenia Resides, MD  metoprolol tartrate (LOPRESSOR) 25 MG tablet Take 1 tablet (25 mg total) by mouth 2 (two) times daily. Patient taking differently: Take 25 mg by mouth 2 (two) times daily. Take two tablets by mouth twice daily. 01/29/21   Zenia Resides, MD  pantoprazole (PROTONIX) 40 MG tablet Take 1 tablet (40 mg total) by mouth daily at 6 (six) AM. 01/29/21   Hensel, Jamal Collin, MD  rosuvastatin (CRESTOR) 20 MG tablet Take 1 tablet (20 mg total) by mouth daily. 01/29/21   Zenia Resides, MD  SPIRIVA HANDIHALER 18 MCG inhalation capsule PLACE ONE CAPSULE INTO THE INHALER AND INHALE DAILY 03/04/21   Zenia Resides, MD     Family History  Problem Relation Age of Onset   Hypertension Other     Social History   Socioeconomic History   Marital status: Single    Spouse name: Not on file   Number of children: Not on file   Years of education: Not on file   Highest  education level: Not on file  Occupational History   Not on file  Tobacco Use   Smoking status: Every Day    Years: 62.00    Types: Cigarettes, Pipe   Smokeless tobacco: Never  Vaping Use   Vaping Use: Never used  Substance and Sexual Activity   Alcohol use: Not Currently    Comment: occasionally drinks beer 09/03/20   Drug use: Not Currently    Comment: previous marijuana use   Sexual activity: Not Currently  Other Topics Concern   Not on file  Social History Narrative   moved from [place] to [place] in 2019 to live with his son    Social Determinants of Health   Financial Resource Strain: Not on file  Food Insecurity: Not on file  Transportation Needs: Not on file  Physical Activity: Not on file  Stress: Not on file  Social Connections: Not on file    Review of Systems: A 12 point ROS discussed and pertinent positives are indicated in the HPI above.  All other systems  are negative.  Review of Systems  Vital Signs: There were no vitals taken for this visit.  Physical Exam  Imaging: No results found.  Labs:  CBC: Recent Labs    03/31/21 1303 04/07/21 1208 04/14/21 1331 04/23/21 1254  WBC 3.3* 2.7* 2.6* 2.6*  HGB 11.2* 10.6* 11.2* 11.3*  HCT 35.5* 32.9* 34.8* 35.5*  PLT 25* 21* 23* 30*    COAGS: Recent Labs    11/20/20 1652  INR 1.8*    BMP: Recent Labs    01/21/21 1400 02/13/21 1312 03/04/21 1136 03/31/21 1303  NA 139 140 141 141  K 4.4 3.9 4.5 4.5  CL 104 105 105 105  CO2 _0 GLUCOSE 98 87 97 102*  BUN 20 24* 26* 27*  CALCIUM 9.3 9.1 9.4 9.6  CREATININE 1.07 1.13 1.21 1.20  GFRNONAA >60 >60 >60 >60    LIVER FUNCTION TESTS: Recent Labs    01/21/21 1400 02/13/21 1312 03/04/21 1136 03/31/21 1303  BILITOT 0.6 0.6 0.7 1.0  AST 12* 13* 13* 17  ALT _1 ALKPHOS 60 58 63 68  PROT 7.6 7.2 7.5 7.8  ALBUMIN 3.6 3.6 3.9 3.9    TUMOR MARKERS: No results for input(s): AFPTM, CEA, CA199, CHROMGRNA in the last  8760 hours.  Assessment and Plan:  History of IDA secondary to underlying GI malignancy, A. fib, COPD, dementia, DM, HTN, tobacco abuse, thrombocytopenia and sigmoid colon adenocarcinoma.  Patient had colonoscopy 10/21/2020 that found a partially obstructing mass in the sigmoid colon consistent with adenocarcinoma.  CT on 10/22/2020 confirmed colonic mass within pelvic lymphadenopathy including right common iliac and external iliac nodes and small pulmonary nodules.  Patient recent labs shows persistent severe thrombocytopenia with stable hemoglobin and white count.  Shawn Card, NP has referred patient to IR for bone marrow biopsy for persistent thrombocytopenia.  Risks and benefits of bone marrow biopsy and aspiration was discussed with the patient and/or patient's family including, but not limited to bleeding, infection, damage to adjacent structures or low yield requiring additional tests.  All of the questions were answered and there is agreement to proceed.  Consent signed and in chart.   Thank you for this interesting consult.  I greatly enjoyed meeting [name] and look forward to participating in their care.  A copy of this report was sent to the requesting provider on this date.  Electronically Signed: Tyson Alias, NP 04/28/2021, 9:15 AM   I spent a total of 30 minutes in face to face in clinical consultation, greater than 50% of which was counseling/coordinating care for image guided bone marrow biopsy and aspiration.

## 2021-04-29 ENCOUNTER — Ambulatory Visit (HOSPITAL_COMMUNITY)
Admission: RE | Admit: 2021-04-29 | Discharge: 2021-04-29 | Disposition: A | Discharge status: Home / Self Care | Payer: Medicare Other | Source: Ambulatory Visit | Attending: Nurse Practitioner | Admitting: Nurse Practitioner

## 2021-04-29 ENCOUNTER — Ambulatory Visit (HOSPITAL_COMMUNITY): Payer: Medicare Other

## 2021-05-08 ENCOUNTER — Other Ambulatory Visit: Payer: Self-pay

## 2021-05-08 ENCOUNTER — Inpatient Hospital Stay: Payer: Medicare Other

## 2021-05-08 ENCOUNTER — Telehealth: Payer: Self-pay

## 2021-05-08 ENCOUNTER — Inpatient Hospital Stay: Payer: Medicare Other | Attending: Nurse Practitioner | Admitting: Oncology

## 2021-05-08 VITALS — BP 121/76 | HR 76 | Temp 97.7°F | Resp 16 | Wt 152.0 lb

## 2021-05-08 DIAGNOSIS — C182 Malignant neoplasm of ascending colon: Secondary | ICD-10-CM | POA: Insufficient documentation

## 2021-05-08 DIAGNOSIS — F0390 Unspecified dementia without behavioral disturbance: Secondary | ICD-10-CM | POA: Diagnosis not present

## 2021-05-08 DIAGNOSIS — J449 Chronic obstructive pulmonary disease, unspecified: Secondary | ICD-10-CM | POA: Insufficient documentation

## 2021-05-08 DIAGNOSIS — E119 Type 2 diabetes mellitus without complications: Secondary | ICD-10-CM | POA: Insufficient documentation

## 2021-05-08 DIAGNOSIS — C187 Malignant neoplasm of sigmoid colon: Secondary | ICD-10-CM

## 2021-05-08 DIAGNOSIS — R2232 Localized swelling, mass and lump, left upper limb: Secondary | ICD-10-CM | POA: Insufficient documentation

## 2021-05-08 DIAGNOSIS — Z923 Personal history of irradiation: Secondary | ICD-10-CM | POA: Diagnosis not present

## 2021-05-08 DIAGNOSIS — I4892 Unspecified atrial flutter: Secondary | ICD-10-CM | POA: Diagnosis not present

## 2021-05-08 DIAGNOSIS — F17210 Nicotine dependence, cigarettes, uncomplicated: Secondary | ICD-10-CM | POA: Diagnosis not present

## 2021-05-08 DIAGNOSIS — Z933 Colostomy status: Secondary | ICD-10-CM | POA: Insufficient documentation

## 2021-05-08 DIAGNOSIS — Z85828 Personal history of other malignant neoplasm of skin: Secondary | ICD-10-CM | POA: Insufficient documentation

## 2021-05-08 DIAGNOSIS — D696 Thrombocytopenia, unspecified: Secondary | ICD-10-CM

## 2021-05-08 DIAGNOSIS — I10 Essential (primary) hypertension: Secondary | ICD-10-CM | POA: Diagnosis not present

## 2021-05-08 DIAGNOSIS — D509 Iron deficiency anemia, unspecified: Secondary | ICD-10-CM | POA: Insufficient documentation

## 2021-05-08 LAB — CBC WITH DIFFERENTIAL (CANCER CENTER ONLY)
Abs Immature Granulocytes: 0.01 10*3/uL (ref 0.00–0.07)
Basophils Absolute: 0 10*3/uL (ref 0.0–0.1)
Basophils Relative: 0 %
Eosinophils Absolute: 0 10*3/uL (ref 0.0–0.5)
Eosinophils Relative: 0 %
HCT: 34.9 % — ABNORMAL LOW (ref 39.0–52.0)
Hemoglobin: 11.3 g/dL — ABNORMAL LOW (ref 13.0–17.0)
Immature Granulocytes: 0 %
Lymphocytes Relative: 36 %
Lymphs Abs: 0.8 10*3/uL (ref 0.7–4.0)
MCH: 27 pg (ref 26.0–34.0)
MCHC: 32.4 g/dL (ref 30.0–36.0)
MCV: 83.5 fL (ref 80.0–100.0)
Monocytes Absolute: 0.2 10*3/uL (ref 0.1–1.0)
Monocytes Relative: 9 %
Neutro Abs: 1.3 10*3/uL — ABNORMAL LOW (ref 1.7–7.7)
Neutrophils Relative %: 55 %
Platelet Count: 19 10*3/uL — ABNORMAL LOW (ref 150–400)
RBC: 4.18 MIL/uL — ABNORMAL LOW (ref 4.22–5.81)
RDW: 16.1 % — ABNORMAL HIGH (ref 11.5–15.5)
WBC Count: 2.3 10*3/uL — ABNORMAL LOW (ref 4.0–10.5)
nRBC: 0 % (ref 0.0–0.2)

## 2021-05-08 NOTE — Telephone Encounter (Signed)
Call [facility] spoke with [name] to refill Spiriva per Dr Benay Spice. She could not refill Spiriva because it to early. The medicine can't be refill until the 19 of Dec. One remaining on the refills I called April to make sure he needed Spiriva to be refill. She stated that pt is out of it. She also stated he being eating well and being taking his medication. She ask about his weight (152lbs) and labs

## 2021-05-08 NOTE — Progress Notes (Signed)
[place] OFFICE PROGRESS NOTE   Diagnosis: Colon cancer, thrombocytopenia  INTERVAL HISTORY:   [name] returns as scheduled.  He reports a decrease in the size of the left arm masses.  He is taking Odomzo.  No bleeding.  No new complaint.  He was not able to go to the scheduled bone marrow biopsy.  He is scheduled for the bone marrow biopsy on 05/23/2021.  Objective:  Vital signs in last 24 hours:  Blood pressure 121/76, pulse 76, temperature 97.7 F (36.5 C), temperature source Tympanic, resp. rate 16, weight 152 lb (68.9 kg), SpO2 100 %.    HEENT: Small ecchymosis at the left buccal mucosa, 1 petechiae at the right buccal mucosa, no active bleeding, no thrush Resp: End inspiratory rales at the right posterior base, no respiratory distress Cardio: Regular rate and rhythm GI: No hepatomegaly, left lower quadrant colostomy with brown stool Vascular: No leg edema  Skin: Small ecchymoses at the dorsum of the hands, petechiae at the lower legs.  Decreased size and thickness of the skin lesion at the left wrist, decreased size of the lesion at the left forearm   Lab Results:  Lab Results  Component Value Date   WBC 2.6 (L) 04/23/2021   HGB 11.3 (L) 04/23/2021   HCT 35.5 (L) 04/23/2021   MCV 81.4 04/23/2021   PLT 30 (L) 04/23/2021   NEUTROABS 1.6 (L) 04/23/2021    CMP  Lab Results  Component Value Date   NA 141 03/31/2021   K 4.5 03/31/2021   CL 105 03/31/2021   CO2 27 03/31/2021   GLUCOSE 102 (H) 03/31/2021   BUN 27 (H) 03/31/2021   CREATININE 1.20 03/31/2021   CALCIUM 9.6 03/31/2021   PROT 7.8 03/31/2021   ALBUMIN 3.9 03/31/2021   AST 17 03/31/2021   ALT 15 03/31/2021   ALKPHOS 68 03/31/2021   BILITOT 1.0 03/31/2021   GFRNONAA >60 03/31/2021   GFRAA 79 01/29/2020    Lab Results  Component Value Date   CEA1 3.9 10/22/2020    Medications: I have reviewed the patient's current medications.   Assessment/Plan: Sigmoid colon  adenocarcinoma -Colonoscopy 10/21/2020- partially obstructing mass found in the sigmoid colon consistent with adenocarcinoma. -CEA on 10/22/2020 was 3.9 -CTs 10/22/2020-colonic mass with pelvic lymphadenopathy including right common iliac and external iliac nodes, small pulmonary nodules -10/24/2020-sigmoid colectomy, end colostomy, tumor stuck to pelvic sidewall with rind of tissue remaining at completion of surgery -Pathology- moderately differentiated adenocarcinoma the sigmoid colon, tumor extends into pericolonic connective tissue, no lymphovascular perineural invasion, 0/14 lymph nodes, carcinoma involves an area with the specimen was disrupted at mesenteric margin; mismatch repair protein (IHC) normal; MSI stable -CTs 11/24/2020-interval resection of colonic mass, decreased size of previously noted right pelvic adenopathy, new noted at the right internal/external iliac bifurcation, unchanged pulmonary nodules -Guardant reveal 12/06/2020-ctDNA not detected -Cycle 1 adjuvant Xeloda anticipated 01/06/2021 -Cycle 2 adjuvant Xeloda 01/27/2021, Xeloda dose reduced secondary to thrombocytopenia (he took 1 pill a day for an unclear amount of time, less than 14 days) Cycle 3 adjuvant Xeloda 02/17/2021 2.  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 3.  Thrombocytopenia-chronic 4.  Atrial flutter 5.  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  COPD 7.  Hypertension 8.  Diabetes mellitus 9.  Dementia 10.  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 Jewell who is going to check with some Mohs specialist to see if disease is resectable, they were holding on radiation appointments until this could be explored. -Established care with the wound center on 09/13/2019 12.   Admission with GI bleeding 11/20/2020 Endoscopy 11/21/2020-mild prepyloric erosions, gastritis Colonoscopy 11/22/2020-no bleeding source identified Capsule endoscopy 11/22/2020-probable AVM with active oozing in the mid small bowel, 1 other small AVM more distally-both out of reach of enteroscope 11/26/2020 balloon enteroscopy-single nonbleeding AVM in the jejunum treated with argon plasma coagulation      Disposition: [name] appears stable.  He has severe thrombocytopenia.  He knows to call for spontaneous bleeding or increased bruising.  He is scheduled for bone marrow biopsy on 05/23/2021.  He will return for a CBC next week.  The plan is to begin an empiric course of prednisone if the platelet count returns lower next week.  He will continue Odomzo for the basal cell carcinomas.  He appears to be responding.  We will check the CPK when he returns next week.   , MD  05/08/2021  2:50 PM    

## 2021-05-15 ENCOUNTER — Inpatient Hospital Stay: Payer: Medicare Other

## 2021-05-15 ENCOUNTER — Other Ambulatory Visit: Payer: Self-pay

## 2021-05-15 DIAGNOSIS — C182 Malignant neoplasm of ascending colon: Secondary | ICD-10-CM | POA: Diagnosis not present

## 2021-05-15 DIAGNOSIS — D696 Thrombocytopenia, unspecified: Secondary | ICD-10-CM

## 2021-05-15 DIAGNOSIS — C187 Malignant neoplasm of sigmoid colon: Secondary | ICD-10-CM

## 2021-05-15 LAB — CBC WITH DIFFERENTIAL (CANCER CENTER ONLY)
Abs Immature Granulocytes: 0.01 10*3/uL (ref 0.00–0.07)
Basophils Absolute: 0 10*3/uL (ref 0.0–0.1)
Basophils Relative: 0 %
Eosinophils Absolute: 0 10*3/uL (ref 0.0–0.5)
Eosinophils Relative: 1 %
HCT: 34.7 % — ABNORMAL LOW (ref 39.0–52.0)
Hemoglobin: 11.3 g/dL — ABNORMAL LOW (ref 13.0–17.0)
Immature Granulocytes: 1 %
Lymphocytes Relative: 31 %
Lymphs Abs: 0.7 10*3/uL (ref 0.7–4.0)
MCH: 26.9 pg (ref 26.0–34.0)
MCHC: 32.6 g/dL (ref 30.0–36.0)
MCV: 82.6 fL (ref 80.0–100.0)
Monocytes Absolute: 0.2 10*3/uL (ref 0.1–1.0)
Monocytes Relative: 8 %
Neutro Abs: 1.3 10*3/uL — ABNORMAL LOW (ref 1.7–7.7)
Neutrophils Relative %: 59 %
Platelet Count: 16 10*3/uL — ABNORMAL LOW (ref 150–400)
RBC: 4.2 MIL/uL — ABNORMAL LOW (ref 4.22–5.81)
RDW: 16 % — ABNORMAL HIGH (ref 11.5–15.5)
WBC Count: 2.2 10*3/uL — ABNORMAL LOW (ref 4.0–10.5)
nRBC: 0 % (ref 0.0–0.2)

## 2021-05-15 LAB — SAMPLE TO BLOOD BANK

## 2021-05-15 LAB — CK: Total CK: 38 U/L — ABNORMAL LOW (ref 49–397)

## 2021-05-19 ENCOUNTER — Inpatient Hospital Stay: Payer: Medicare Other

## 2021-05-19 ENCOUNTER — Other Ambulatory Visit: Payer: Self-pay

## 2021-05-19 DIAGNOSIS — C187 Malignant neoplasm of sigmoid colon: Secondary | ICD-10-CM

## 2021-05-19 DIAGNOSIS — C182 Malignant neoplasm of ascending colon: Secondary | ICD-10-CM | POA: Diagnosis not present

## 2021-05-19 LAB — CBC WITH DIFFERENTIAL (CANCER CENTER ONLY)
Abs Immature Granulocytes: 0.01 10*3/uL (ref 0.00–0.07)
Basophils Absolute: 0 10*3/uL (ref 0.0–0.1)
Basophils Relative: 0 %
Eosinophils Absolute: 0 10*3/uL (ref 0.0–0.5)
Eosinophils Relative: 1 %
HCT: 35.1 % — ABNORMAL LOW (ref 39.0–52.0)
Hemoglobin: 11.3 g/dL — ABNORMAL LOW (ref 13.0–17.0)
Immature Granulocytes: 0 %
Lymphocytes Relative: 29 %
Lymphs Abs: 0.8 10*3/uL (ref 0.7–4.0)
MCH: 27 pg (ref 26.0–34.0)
MCHC: 32.2 g/dL (ref 30.0–36.0)
MCV: 84 fL (ref 80.0–100.0)
Monocytes Absolute: 0.3 10*3/uL (ref 0.1–1.0)
Monocytes Relative: 9 %
Neutro Abs: 1.7 10*3/uL (ref 1.7–7.7)
Neutrophils Relative %: 61 %
Platelet Count: 16 10*3/uL — ABNORMAL LOW (ref 150–400)
RBC: 4.18 MIL/uL — ABNORMAL LOW (ref 4.22–5.81)
RDW: 16.2 % — ABNORMAL HIGH (ref 11.5–15.5)
WBC Count: 2.8 10*3/uL — ABNORMAL LOW (ref 4.0–10.5)
nRBC: 0 % (ref 0.0–0.2)

## 2021-05-19 LAB — SAMPLE TO BLOOD BANK

## 2021-05-19 NOTE — Progress Notes (Signed)
Pt presented to phlebotomy department for his lab appt stating that he was short of breath. Pt spoke with MD Benay Spice in the hallway. MD Sherrill instructed pt to wait in the lobby for his CBC results. RN notified and proceeded to lobby to assess pt. Pt had already left facility. Unable to obtain VS. MD Benay Spice aware

## 2021-05-20 ENCOUNTER — Other Ambulatory Visit: Payer: Medicare Other

## 2021-05-21 ENCOUNTER — Other Ambulatory Visit: Payer: Self-pay | Admitting: Student

## 2021-05-22 ENCOUNTER — Encounter: Payer: Self-pay | Admitting: *Deleted

## 2021-05-23 ENCOUNTER — Ambulatory Visit (HOSPITAL_COMMUNITY): Payer: Medicare Other

## 2021-05-23 ENCOUNTER — Telehealth: Payer: Self-pay | Admitting: *Deleted

## 2021-05-23 DIAGNOSIS — D696 Thrombocytopenia, unspecified: Secondary | ICD-10-CM

## 2021-05-23 NOTE — Telephone Encounter (Signed)
Called patient regarding him moving his bone marrow biopsy out again to 06/19/21. Informed him Dr. Benay Spice is very concerned about this with his very low platelet and WBC counts w/bleeding and infection risk. He agrees to lab only visit on 12/27 and will keep his 06/05/21 Lab/OV to monitor his counts. Informed him to call for any bleeding or fever or s/s infection. He reports he uses [employer] for transportation. Scheduler notified.

## 2021-05-27 ENCOUNTER — Other Ambulatory Visit: Payer: Self-pay

## 2021-05-27 ENCOUNTER — Inpatient Hospital Stay: Payer: Medicare Other

## 2021-05-27 ENCOUNTER — Telehealth: Payer: Self-pay

## 2021-05-27 DIAGNOSIS — D696 Thrombocytopenia, unspecified: Secondary | ICD-10-CM

## 2021-05-27 DIAGNOSIS — C182 Malignant neoplasm of ascending colon: Secondary | ICD-10-CM | POA: Diagnosis not present

## 2021-05-27 LAB — CBC WITH DIFFERENTIAL (CANCER CENTER ONLY)
Abs Immature Granulocytes: 0.01 10*3/uL (ref 0.00–0.07)
Basophils Absolute: 0 10*3/uL (ref 0.0–0.1)
Basophils Relative: 0 %
Eosinophils Absolute: 0 10*3/uL (ref 0.0–0.5)
Eosinophils Relative: 0 %
HCT: 33.2 % — ABNORMAL LOW (ref 39.0–52.0)
Hemoglobin: 10.9 g/dL — ABNORMAL LOW (ref 13.0–17.0)
Immature Granulocytes: 0 %
Lymphocytes Relative: 28 %
Lymphs Abs: 0.7 10*3/uL (ref 0.7–4.0)
MCH: 27.3 pg (ref 26.0–34.0)
MCHC: 32.8 g/dL (ref 30.0–36.0)
MCV: 83 fL (ref 80.0–100.0)
Monocytes Absolute: 0.2 10*3/uL (ref 0.1–1.0)
Monocytes Relative: 9 %
Neutro Abs: 1.6 10*3/uL — ABNORMAL LOW (ref 1.7–7.7)
Neutrophils Relative %: 63 %
Platelet Count: 15 10*3/uL — ABNORMAL LOW (ref 150–400)
RBC: 4 MIL/uL — ABNORMAL LOW (ref 4.22–5.81)
RDW: 16.1 % — ABNORMAL HIGH (ref 11.5–15.5)
Smear Review: NORMAL
WBC Count: 2.6 10*3/uL — ABNORMAL LOW (ref 4.0–10.5)
nRBC: 0 % (ref 0.0–0.2)

## 2021-05-27 NOTE — Telephone Encounter (Signed)
TC to Pt spoke with Pt's daughter in law who stated she would relay the message. Also inquired about the Bone Marrow biopsy. Pt's daughter in law stated they had a sick family member and rescheduled appointment.

## 2021-05-27 NOTE — Telephone Encounter (Signed)
-----  Message from Ladell Pier, MD sent at 05/27/2021  2:17 PM EST ----- Please call patient, platelets are low and stable, call for bleeding, I recommend he proceed with the bone marrow biopsy as soon as possible, please see if there is a way to move the bone marrow biopsy up to within the next 1 week

## 2021-06-04 ENCOUNTER — Encounter (HOSPITAL_COMMUNITY): Payer: Self-pay

## 2021-06-04 ENCOUNTER — Inpatient Hospital Stay (HOSPITAL_COMMUNITY)
Admission: EM | Admit: 2021-06-04 | Discharge: 2021-06-10 | DRG: 315 | Disposition: A | Discharge status: Home / Self Care | Payer: Medicare Other | Attending: Family Medicine | Admitting: Family Medicine

## 2021-06-04 ENCOUNTER — Other Ambulatory Visit: Payer: Self-pay

## 2021-06-04 ENCOUNTER — Emergency Department (HOSPITAL_COMMUNITY): Payer: Medicare Other

## 2021-06-04 DIAGNOSIS — R55 Syncope and collapse: Secondary | ICD-10-CM

## 2021-06-04 DIAGNOSIS — F0390 Unspecified dementia without behavioral disturbance: Secondary | ICD-10-CM | POA: Diagnosis present

## 2021-06-04 DIAGNOSIS — Z953 Presence of xenogenic heart valve: Secondary | ICD-10-CM

## 2021-06-04 DIAGNOSIS — D696 Thrombocytopenia, unspecified: Secondary | ICD-10-CM | POA: Diagnosis present

## 2021-06-04 DIAGNOSIS — F17290 Nicotine dependence, other tobacco product, uncomplicated: Secondary | ICD-10-CM | POA: Diagnosis present

## 2021-06-04 DIAGNOSIS — I483 Typical atrial flutter: Secondary | ICD-10-CM | POA: Diagnosis present

## 2021-06-04 DIAGNOSIS — I9589 Other hypotension: Principal | ICD-10-CM | POA: Diagnosis present

## 2021-06-04 DIAGNOSIS — C187 Malignant neoplasm of sigmoid colon: Secondary | ICD-10-CM

## 2021-06-04 DIAGNOSIS — Z79899 Other long term (current) drug therapy: Secondary | ICD-10-CM

## 2021-06-04 DIAGNOSIS — Z9221 Personal history of antineoplastic chemotherapy: Secondary | ICD-10-CM

## 2021-06-04 DIAGNOSIS — E119 Type 2 diabetes mellitus without complications: Secondary | ICD-10-CM | POA: Diagnosis present

## 2021-06-04 DIAGNOSIS — D61818 Other pancytopenia: Secondary | ICD-10-CM | POA: Diagnosis present

## 2021-06-04 DIAGNOSIS — Z85038 Personal history of other malignant neoplasm of large intestine: Secondary | ICD-10-CM

## 2021-06-04 DIAGNOSIS — Z7982 Long term (current) use of aspirin: Secondary | ICD-10-CM

## 2021-06-04 DIAGNOSIS — C9200 Acute myeloblastic leukemia, not having achieved remission: Secondary | ICD-10-CM | POA: Diagnosis present

## 2021-06-04 DIAGNOSIS — Z85828 Personal history of other malignant neoplasm of skin: Secondary | ICD-10-CM

## 2021-06-04 DIAGNOSIS — I4892 Unspecified atrial flutter: Secondary | ICD-10-CM | POA: Diagnosis present

## 2021-06-04 DIAGNOSIS — Z7901 Long term (current) use of anticoagulants: Secondary | ICD-10-CM

## 2021-06-04 DIAGNOSIS — Z20822 Contact with and (suspected) exposure to covid-19: Secondary | ICD-10-CM | POA: Diagnosis present

## 2021-06-04 DIAGNOSIS — D509 Iron deficiency anemia, unspecified: Secondary | ICD-10-CM | POA: Diagnosis present

## 2021-06-04 DIAGNOSIS — I959 Hypotension, unspecified: Secondary | ICD-10-CM

## 2021-06-04 DIAGNOSIS — J449 Chronic obstructive pulmonary disease, unspecified: Secondary | ICD-10-CM | POA: Diagnosis present

## 2021-06-04 DIAGNOSIS — I480 Paroxysmal atrial fibrillation: Secondary | ICD-10-CM | POA: Diagnosis present

## 2021-06-04 DIAGNOSIS — I4430 Unspecified atrioventricular block: Secondary | ICD-10-CM | POA: Diagnosis not present

## 2021-06-04 DIAGNOSIS — Z8249 Family history of ischemic heart disease and other diseases of the circulatory system: Secondary | ICD-10-CM

## 2021-06-04 DIAGNOSIS — Z952 Presence of prosthetic heart valve: Secondary | ICD-10-CM

## 2021-06-04 DIAGNOSIS — Z86718 Personal history of other venous thrombosis and embolism: Secondary | ICD-10-CM

## 2021-06-04 DIAGNOSIS — E785 Hyperlipidemia, unspecified: Secondary | ICD-10-CM | POA: Diagnosis present

## 2021-06-04 DIAGNOSIS — Z951 Presence of aortocoronary bypass graft: Secondary | ICD-10-CM

## 2021-06-04 LAB — PROTIME-INR
INR: 1.2 (ref 0.8–1.2)
Prothrombin Time: 15.6 seconds — ABNORMAL HIGH (ref 11.4–15.2)

## 2021-06-04 LAB — CBC WITH DIFFERENTIAL/PLATELET
Abs Immature Granulocytes: 0.01 10*3/uL (ref 0.00–0.07)
Basophils Absolute: 0 10*3/uL (ref 0.0–0.1)
Basophils Relative: 0 %
Eosinophils Absolute: 0 10*3/uL (ref 0.0–0.5)
Eosinophils Relative: 0 %
HCT: 29.2 % — ABNORMAL LOW (ref 39.0–52.0)
Hemoglobin: 9.4 g/dL — ABNORMAL LOW (ref 13.0–17.0)
Immature Granulocytes: 0 %
Lymphocytes Relative: 40 %
Lymphs Abs: 0.9 10*3/uL (ref 0.7–4.0)
MCH: 27.6 pg (ref 26.0–34.0)
MCHC: 32.2 g/dL (ref 30.0–36.0)
MCV: 85.6 fL (ref 80.0–100.0)
Monocytes Absolute: 0.3 10*3/uL (ref 0.1–1.0)
Monocytes Relative: 12 %
Neutro Abs: 1.1 10*3/uL — ABNORMAL LOW (ref 1.7–7.7)
Neutrophils Relative %: 48 %
Platelets: 12 10*3/uL — CL (ref 150–400)
RBC: 3.41 MIL/uL — ABNORMAL LOW (ref 4.22–5.81)
RDW: 16.2 % — ABNORMAL HIGH (ref 11.5–15.5)
WBC: 2.3 10*3/uL — ABNORMAL LOW (ref 4.0–10.5)
nRBC: 0 % (ref 0.0–0.2)

## 2021-06-04 LAB — COMPREHENSIVE METABOLIC PANEL
ALT: 23 U/L (ref 0–44)
AST: 21 U/L (ref 15–41)
Albumin: 3.2 g/dL — ABNORMAL LOW (ref 3.5–5.0)
Alkaline Phosphatase: 69 U/L (ref 38–126)
Anion gap: 5 (ref 5–15)
BUN: 27 mg/dL — ABNORMAL HIGH (ref 8–23)
CO2: 27 mmol/L (ref 22–32)
Calcium: 8.6 mg/dL — ABNORMAL LOW (ref 8.9–10.3)
Chloride: 106 mmol/L (ref 98–111)
Creatinine, Ser: 1.25 mg/dL — ABNORMAL HIGH (ref 0.61–1.24)
GFR, Estimated: 60 mL/min — ABNORMAL LOW (ref >60)
Glucose, Bld: 87 mg/dL (ref 70–99)
Potassium: 4.2 mmol/L (ref 3.5–5.1)
Sodium: 138 mmol/L (ref 135–145)
Total Bilirubin: 0.8 mg/dL (ref 0.3–1.2)
Total Protein: 6.4 g/dL — ABNORMAL LOW (ref 6.5–8.1)

## 2021-06-04 LAB — TSH: TSH: 1.437 u[IU]/mL (ref 0.350–4.500)

## 2021-06-04 LAB — TROPONIN I (HIGH SENSITIVITY): Troponin I (High Sensitivity): 4 ng/L (ref <18)

## 2021-06-04 LAB — MAGNESIUM: Magnesium: 2.3 mg/dL (ref 1.7–2.4)

## 2021-06-04 MED ORDER — SODIUM CHLORIDE 0.9 % IV BOLUS
500.0000 mL | Freq: Once | INTRAVENOUS | Status: AC
  Administered 2021-06-04: 500 mL via INTRAVENOUS

## 2021-06-04 NOTE — ED Provider Notes (Signed)
Emergency Department Provider Note   I have reviewed the triage vital signs and the nursing notes.   HISTORY  Chief Complaint Near Syncope and Irregular Heart Beat   HPI [name] is a 76 y.o. male with past medical history reviewed below including atrial flutter, COPD, dementia, sigmoid adenocarcinoma with ostomy and thrombocytopenia presents with syncope/near syncope today which ultimately prompted EMS to be called by family.  He states he was trying to get up out of the chair and felt lightheaded and passed out.  Did not fall or hit his head but fell back into the chair.  He states has been feeling lightheaded since.  He states that EMS came out to his house and told him that his heart was out of rhythm.  Denies any active chest pain, abdominal pain, headache, numbness/weakness. Denies bleeding.  No radiation of symptoms or other modifying factors.    Past Medical History:  Diagnosis Date   Anemia    Atrial fibrillation ([place])    post operative in 2018   Atrial flutter (HCC)    Bacteremia 2018   Basal cell carcinoma    COPD (chronic obstructive pulmonary disease) (HCC)    Dementia (HCC)    Diabetes (HCC)    DVT of deep femoral vein (HCC)    Hypertension    Thrombocytopenia (HCC)     Review of Systems  Constitutional: No fever/chills Eyes: No visual changes. ENT: No sore throat. Cardiovascular: Denies chest pain. Positive near syncope.  Respiratory: Denies shortness of breath. Gastrointestinal: No abdominal pain.  No nausea, no vomiting.  No diarrhea.  No constipation. Genitourinary: Negative for dysuria. Musculoskeletal: Negative for back pain. Skin: Negative for rash. Neurological: Negative for headaches, focal weakness or numbness.  10-point ROS otherwise negative.  ____________________________________________   PHYSICAL EXAM:  VITAL SIGNS: ED Triage Vitals  Enc Vitals Group     BP 06/04/21 2053 113/68     Pulse --      Resp 06/04/21 2053 18      Temp 06/04/21 2053 (!) 97.5 F (36.4 C)     Temp Source 06/04/21 2053 Oral     SpO2 06/04/21 2047 99 %     Weight 06/04/21 2054 151 lb (68.5 kg)     Height 06/04/21 2054 6' (1.829 m)    Constitutional: Alert. Well appearing and in no acute distress. Eyes: Conjunctivae are normal. Head: Atraumatic. Nose: No congestion/rhinnorhea. Mouth/Throat: Mucous membranes are dry.  Neck: No stridor.   Cardiovascular: Normal rate, regular rhythm. Good peripheral circulation. Grossly normal heart sounds.   Respiratory: Normal respiratory effort.  No retractions. Lungs CTAB. Gastrointestinal: Soft and nontender. No distention.  Well-appearing ostomy with good output.  No gross blood or melena. Musculoskeletal: No lower extremity tenderness nor edema. No gross deformities of extremities. Neurologic:  Normal speech and language. No gross focal neurologic deficits are appreciated.  Skin:  Skin is warm, dry and intact. No rash noted.   ____________________________________________   LABS (all labs ordered are listed, but only abnormal results are displayed)  Labs Reviewed  COMPREHENSIVE METABOLIC PANEL - Abnormal; Notable for the following components:      Result Value   BUN 27 (*)    Creatinine, Ser 1.25 (*)    Calcium 8.6 (*)    Total Protein 6.4 (*)    Albumin 3.2 (*)    GFR, Estimated 60 (*)    All other components within normal limits  CBC WITH DIFFERENTIAL/PLATELET - Abnormal; Notable for the following  components:   WBC 2.3 (*)    RBC 3.41 (*)    Hemoglobin 9.4 (*)    HCT 29.2 (*)    RDW 16.2 (*)    Platelets 12 (*)    Neutro Abs 1.1 (*)    All other components within normal limits  PROTIME-INR - Abnormal; Notable for the following components:   Prothrombin Time 15.6 (*)    All other components within normal limits  RESP PANEL BY RT-PCR (FLU A&B, COVID) ARPGX2  MAGNESIUM  TSH  URINALYSIS, ROUTINE W REFLEX MICROSCOPIC  TROPONIN I (HIGH SENSITIVITY)  TROPONIN I (HIGH  SENSITIVITY)   ____________________________________________  EKG   EKG Interpretation  Date/Time:  Wednesday June 04 2021 20:52:39 EST Ventricular Rate:  57 PR Interval:    QRS Duration: 101 QT Interval:  470 QTC Calculation: 458 R Axis:   78 Text Interpretation: Atrial flutter with predominant 4:1 AV block RSR' in V1 or V2, right VCD or RVH  Nonspecific ST changes Confirmed by Nanda Quinton [phone]) on 06/04/2021 9:04:51 PM        ____________________________________________  RADIOLOGY  DG Chest Portable 1 View  Result Date: 06/04/2021 CLINICAL DATA:  Dizziness, near syncope EXAM: PORTABLE CHEST 1 VIEW COMPARISON:  11/20/2020 FINDINGS: Single frontal view of the chest demonstrates postsurgical changes from CABG. Cardiac silhouette is stable. Mild central vascular congestion without airspace disease, effusion, or pneumothorax. No acute bony abnormalities. IMPRESSION: 1. Mild central vascular congestion without overt edema. Electronically Signed   By: Randa Ngo M.D.   On: 06/04/2021 21:35    ____________________________________________   PROCEDURES  Procedure(s) performed:   Procedures  None  ____________________________________________   INITIAL IMPRESSION / ASSESSMENT AND PLAN / ED COURSE  Pertinent labs & imaging results that were available during my care of the patient were reviewed by me and considered in my medical decision making (see chart for details).   This patient is Presenting for Evaluation of syncope, which does require a range of treatment options, and is a complaint that involves a high risk of morbidity and mortality.  The Differential Diagnoses include cardiogenic syncope, dehydration, ACS, PE, anemia, sepsis, CVA.  Medical Decision Making: Summary:  Patient presents emergency department with near syncope/syncope at home and continued lightheadedness.  Blood pressure soft initially but improving with fluids.  No fever.  Patient being followed  for thrombocytopenia but no clear bleeding here.  He is not anticoagulated.  His atrial flutter compared to prior EKGs and seems similar to prior.  He is not a candidate for cardioversion as he is rate controlled here and not anticoagulated.  Patient has no focal neurologic deficits to strongly suspect stroke or intracranial hemorrhage/masses/metastasis. Hold on neuro imaging for now.     Reevaluation with update and discussion with patient.  His platelets are significantly low and he is continuing to feel lightheaded.  Concern for underlying cardiogenic syncope although dehydration suspected.  Plan for admit    Consult complete with the Hospitalist  Discussed patient's case with [facility] to request admission. Patient and family (if present) updated with plan. Care transferred to [facility] service.  I reviewed all nursing notes, vitals, pertinent old records, EKGs, labs, imaging (as available).       I decided to review pertinent External Data, and in summary patient actively followed by oncology.  No active treatment for his adenocarcinoma.  They are following his thrombocytopenia which does appear worse today in comparison to prior values.  There is discussion in the chart regarding bone  marrow biopsy and possible steroid burst although unclear if this is taken place from documentation provided. .   Clinical Laboratory Tests Ordered, included CBC showing platelets of 12.  WBC count of 2.3.  Neutrophils of 1.1.  Mild elevated creatinine 1.25 compared to baseline of around 1.  Magnesium is normal.  Troponin is normal.  INR normal.  TSH normal.  Radiologic Tests Ordered, included chest x-ray which was independently viewed by me.  No infiltrate, pneumothorax, pulmonary edema.  Cardiac Monitor Tracing which shows atrial flutter (rate controlled).   ____________________________________________  FINAL CLINICAL IMPRESSION(S) / ED DIAGNOSES  Final diagnoses:  Near syncope  Typical atrial flutter (HCC)   Thrombocytopenia (HCC)     MEDICATIONS GIVEN DURING THIS VISIT:  Medications  sodium chloride 0.9 % bolus 500 mL (500 mLs Intravenous New Bag/Given 06/04/21 2140)    Note:  This document was prepared using Dragon voice recognition software and may include unintentional dictation errors.  Nanda Quinton, MD, [facility]    Lysandra Loughmiller, [facility], MD 06/04/21 2351

## 2021-06-04 NOTE — ED Triage Notes (Signed)
Pt BIB EMS after getting dizzy while getting up from bed. Upon Ems arrival they noticed that pt is in a 4:1 flutter with no cardiac hx, besides a procedure in 2019.While on route EMS started a 250 bolus of fluids. Pt denies any pain.   VSS w/ EMS besides the irregular heart rhythm.

## 2021-06-04 NOTE — ED Notes (Signed)
Pt ambulated with no assistance. Pt stats stayed about 94%. Pt denied any dizziness.

## 2021-06-05 ENCOUNTER — Inpatient Hospital Stay: Payer: Medicare Other

## 2021-06-05 ENCOUNTER — Encounter (HOSPITAL_COMMUNITY): Payer: Self-pay | Admitting: Internal Medicine

## 2021-06-05 ENCOUNTER — Telehealth: Payer: Self-pay | Admitting: Oncology

## 2021-06-05 ENCOUNTER — Observation Stay (HOSPITAL_COMMUNITY): Payer: Medicare Other

## 2021-06-05 ENCOUNTER — Inpatient Hospital Stay: Payer: Medicare Other | Admitting: Oncology

## 2021-06-05 ENCOUNTER — Telehealth: Payer: Self-pay | Admitting: *Deleted

## 2021-06-05 DIAGNOSIS — D696 Thrombocytopenia, unspecified: Secondary | ICD-10-CM | POA: Diagnosis present

## 2021-06-05 DIAGNOSIS — R55 Syncope and collapse: Secondary | ICD-10-CM | POA: Diagnosis not present

## 2021-06-05 DIAGNOSIS — I484 Atypical atrial flutter: Secondary | ICD-10-CM

## 2021-06-05 DIAGNOSIS — N179 Acute kidney failure, unspecified: Secondary | ICD-10-CM

## 2021-06-05 DIAGNOSIS — Z952 Presence of prosthetic heart valve: Secondary | ICD-10-CM

## 2021-06-05 DIAGNOSIS — Z951 Presence of aortocoronary bypass graft: Secondary | ICD-10-CM

## 2021-06-05 LAB — BASIC METABOLIC PANEL
Anion gap: 4 — ABNORMAL LOW (ref 5–15)
BUN: 23 mg/dL (ref 8–23)
CO2: 24 mmol/L (ref 22–32)
Calcium: 8.4 mg/dL — ABNORMAL LOW (ref 8.9–10.3)
Chloride: 108 mmol/L (ref 98–111)
Creatinine, Ser: 1.1 mg/dL (ref 0.61–1.24)
GFR, Estimated: >60 mL/min (ref >60)
Glucose, Bld: 82 mg/dL (ref 70–99)
Potassium: 5 mmol/L (ref 3.5–5.1)
Sodium: 136 mmol/L (ref 135–145)

## 2021-06-05 LAB — CBC WITH DIFFERENTIAL/PLATELET
Abs Immature Granulocytes: 0 10*3/uL (ref 0.00–0.07)
Basophils Absolute: 0 10*3/uL (ref 0.0–0.1)
Basophils Relative: 0 %
Eosinophils Absolute: 0 10*3/uL (ref 0.0–0.5)
Eosinophils Relative: 1 %
HCT: 29.2 % — ABNORMAL LOW (ref 39.0–52.0)
Hemoglobin: 9.5 g/dL — ABNORMAL LOW (ref 13.0–17.0)
Immature Granulocytes: 0 %
Lymphocytes Relative: 46 %
Lymphs Abs: 0.8 10*3/uL (ref 0.7–4.0)
MCH: 27.9 pg (ref 26.0–34.0)
MCHC: 32.5 g/dL (ref 30.0–36.0)
MCV: 85.9 fL (ref 80.0–100.0)
Monocytes Absolute: 0.2 10*3/uL (ref 0.1–1.0)
Monocytes Relative: 10 %
Neutro Abs: 0.7 10*3/uL — ABNORMAL LOW (ref 1.7–7.7)
Neutrophils Relative %: 43 %
Platelets: 13 10*3/uL — CL (ref 150–400)
RBC: 3.4 MIL/uL — ABNORMAL LOW (ref 4.22–5.81)
RDW: 16.3 % — ABNORMAL HIGH (ref 11.5–15.5)
Smear Review: DECREASED
WBC: 1.6 10*3/uL — ABNORMAL LOW (ref 4.0–10.5)
nRBC: 0 % (ref 0.0–0.2)

## 2021-06-05 LAB — TROPONIN I (HIGH SENSITIVITY): Troponin I (High Sensitivity): 4 ng/L (ref <18)

## 2021-06-05 LAB — RESP PANEL BY RT-PCR (FLU A&B, COVID) ARPGX2
Influenza A by PCR: NEGATIVE
Influenza B by PCR: NEGATIVE
SARS Coronavirus 2 by RT PCR: NEGATIVE

## 2021-06-05 MED ORDER — ALBUTEROL SULFATE (2.5 MG/3ML) 0.083% IN NEBU: 3.0000 mL | INHALATION_SOLUTION | Freq: Four times a day (QID) | RESPIRATORY_TRACT | Status: DC | PRN

## 2021-06-05 MED ORDER — CAPECITABINE 500 MG PO TABS
500.0000 mg | ORAL_TABLET | Freq: Every day | ORAL | Status: DC
Start: 2021-06-05 — End: 2021-06-06

## 2021-06-05 MED ORDER — PANTOPRAZOLE SODIUM 40 MG PO TBEC
40.0000 mg | DELAYED_RELEASE_TABLET | Freq: Every day | ORAL | Status: DC
  Administered 2021-06-05 – 2021-06-10 (×6): 40 mg via ORAL
  Filled 2021-06-05 (×6): qty 1

## 2021-06-05 MED ORDER — IPRATROPIUM-ALBUTEROL 0.5-2.5 (3) MG/3ML IN SOLN
3.0000 mL | Freq: Four times a day (QID) | RESPIRATORY_TRACT | Status: DC
  Administered 2021-06-06 (×3): 3 mL via RESPIRATORY_TRACT
  Filled 2021-06-05 (×4): qty 3

## 2021-06-05 MED ORDER — DILTIAZEM HCL ER COATED BEADS 120 MG PO CP24
120.0000 mg | ORAL_CAPSULE | Freq: Every day | ORAL | Status: DC
  Administered 2021-06-05: 120 mg via ORAL
  Filled 2021-06-05: qty 1

## 2021-06-05 MED ORDER — UMECLIDINIUM BROMIDE 62.5 MCG/ACT IN AEPB
1.0000 | INHALATION_SPRAY | Freq: Every day | RESPIRATORY_TRACT | Status: DC
  Administered 2021-06-05 – 2021-06-09 (×5): 1 via RESPIRATORY_TRACT
  Filled 2021-06-05: qty 7

## 2021-06-05 MED ORDER — ACETAMINOPHEN 650 MG RE SUPP: 650.0000 mg | Freq: Four times a day (QID) | RECTAL | Status: DC | PRN

## 2021-06-05 MED ORDER — ACETAMINOPHEN 325 MG PO TABS
650.0000 mg | ORAL_TABLET | Freq: Four times a day (QID) | ORAL | Status: DC | PRN
  Administered 2021-06-09: 650 mg via ORAL
  Filled 2021-06-05: qty 2

## 2021-06-05 MED ORDER — METOPROLOL TARTRATE 25 MG PO TABS
25.0000 mg | ORAL_TABLET | Freq: Two times a day (BID) | ORAL | Status: DC
  Administered 2021-06-05: 25 mg via ORAL
  Filled 2021-06-05: qty 1

## 2021-06-05 MED ORDER — FERROUS SULFATE 325 (65 FE) MG PO TABS
325.0000 mg | ORAL_TABLET | Freq: Every day | ORAL | Status: DC
  Administered 2021-06-05 – 2021-06-10 (×6): 325 mg via ORAL
  Filled 2021-06-05 (×6): qty 1

## 2021-06-05 MED ORDER — TIOTROPIUM BROMIDE MONOHYDRATE 18 MCG IN CAPS
18.0000 ug | ORAL_CAPSULE | Freq: Every day | RESPIRATORY_TRACT | Status: DC
  Filled 2021-06-05: qty 5

## 2021-06-05 NOTE — Progress Notes (Signed)
Family Medicine Teaching Service Daily Progress Note Intern Pager: [phone]  Patient name: [name] Medical record number: [id number] Date of birth: 10/12/1945 Age: 76 y.o. Gender: male  Primary Care Provider: Zenia Resides, MD Consultants: None Code Status: Full  Pt Overview and Major Events to Date:  06/04/21 - Patient admitted  Assessment and Plan:  RAYVEN HENDRICKSON is a 76 y.o. 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   Pre-Syncope Pulse bradycardic at 55-57, BP stable 100-120's/60's, I: 500 IVF. Patient improved today, able to stand and walk freely without issue. No complaints of presyncope.  -Orthostatic BP -Cardiac monitoring -PT eval  Pancytopenia   Colon Cancer Adenocarcinoma WBC: 1.6 low, Hgb 9.5 low, PLT 13 crit low. [place]arrow biops sched 06/19/21, hx of avm in mid small bowel. Hx of CRC s/p chemotherapy, & sigmoid-colecectomy. Reached out to oncology provider, patient continues to put off biopsy, provider wanting patient to receive biopsy tomorrow, as he is unsure why patient's cell lines are low.   -Bone marrow biopsy   AKI   CKD  Cr 1.10 this morning, improved from 1.25 on admission. GFR > 60. Baseline Cr 0.95-1.07, s/p 500 cc bolus.  -Continue to monitor   PAF/ A. Flutter No anticoagulation 2/2 severe thrombocytopenia, rate controlled a flutter on EKG -D/c Cardizem 24 hr 120 mg daily -Metoprolol tartarte 25 mg BID   COPD -Incruse ellipta 62.5 -Albuterol Neb prn   FEN/GI: Regular Diet PPx: SCD's, avoiding anticoagulation 2/2 sever thrombocytopenia Dispo: pending clinical improvement . Barriers include need for bone marrow biopsy.   Subjective:  Patient reports he feels well today, back to baseline, no sense of presyncope.   Objective: Temp:  [97.5 F (36.4 C)] 97.5 F (36.4 C) (01/04  2053) Pulse Rate:  [55-57] 55 (01/05 0245) Resp:  [10-18] 13 (01/05 0245) BP: (105-122)/(62-68) 105/62 (01/05 0245) SpO2:  [91 %-99 %] 93 % (01/05 0245) Weight:  [68.5 kg] 68.5 kg (01/04 2054) Physical Exam: General: Frail, elderly, speech hard to understand 2/2 to stutter Cardiovascular: RRR, [place] Respiratory: CTABL Abdomen: Soft, NTTP, non-distended Extremities: Moving all extremities independently, able to stand/walk without signs of distress  Laboratory: Recent Labs  Lab 06/04/21 2137 06/05/21 0620  WBC 2.3* 1.6*  HGB 9.4* 9.5*  HCT 29.2* 29.2*  PLT 12* 13*   Recent Labs  Lab 06/04/21 2137  NA 138  K 4.2  CL 106  CO2 27  BUN 27*  CREATININE 1.25*  CALCIUM 8.6*  PROT 6.4*  BILITOT 0.8  ALKPHOS 69  ALT 23  AST 21  GLUCOSE 87      Imaging/Diagnostic Tests:   Holley Bouche, MD 06/05/2021, 7:50 AM PGY-1, [place] Intern pager: [phone], text pages welcome

## 2021-06-05 NOTE — Evaluation (Signed)
Physical Therapy Evaluation Patient Details Name: [name] MRN: [id number] DOB: June 16, 1945 Today's Date: 06/05/2021  History of Present Illness  Pt is a 76 y/o male admitted 1/4 secondary to dizziness and near syncope. MRI negative. PMH includes dementia, COPD, Colon cancer with chronic thrombocytopenia, CABG, s/p MVR, and a fib.  Clinical Impression  Pt admitted secondary to problem above with deficits below. Pt asymptomatic throughout transfers and ambulation requiring min guard A For safety. BP in standing at 95/55 and after ambulation 91/63. Anticipate pt will progress well and will not require follow up PT. Will continue to follow acutely.        Recommendations for follow up therapy are one component of a multi-disciplinary discharge planning process, led by the attending physician.  Recommendations may be updated based on patient status, additional functional criteria and insurance authorization.  Follow Up Recommendations No PT follow up    Assistance Recommended at Discharge Frequent or constant Supervision/Assistance  Patient can return home with the following  Assistance with cooking/housework;Direct supervision/assist for financial management;Direct supervision/assist for medications management    Equipment Recommendations None recommended by PT  Recommendations for Other Services       Functional Status Assessment Patient has had a recent decline in their functional status and demonstrates the ability to make significant improvements in function in a reasonable and predictable amount of time.     Precautions / Restrictions Precautions Precautions: Fall Restrictions Weight Bearing Restrictions: No      Mobility  Bed Mobility Overal bed mobility: Needs Assistance Bed Mobility: Supine to Sit;Sit to Supine     Supine to sit: Supervision Sit to supine: Supervision   General bed mobility comments: supervision for safety    Transfers Overall transfer level:  Needs assistance Equipment used: None Transfers: Sit to/from Stand Sit to Stand: Min guard           General transfer comment: Min guard for safety to stand from stretcher    Ambulation/Gait Ambulation/Gait assistance: Min guard Gait Distance (Feet): 30 Feet Assistive device: None Gait Pattern/deviations: Step-through pattern;Decreased stride length Gait velocity: Decreased     General Gait Details: Min guard for safety. No overt LOB noted. Asymptomatic. BP after short distance ambulation in room at 91/63. RN notified  Stairs            Wheelchair Mobility    Modified Rankin (Stroke Patients Only)       Balance Overall balance assessment: Mild deficits observed, not formally tested                                           Pertinent Vitals/Pain Pain Assessment: No/denies pain    Home Living Family/patient expects to be discharged to:: Private residence Living Arrangements: Children Available Help at Discharge: Family;Available 24 hours/day Type of Home: Mobile home Home Access: Stairs to enter Entrance Stairs-Rails: Right;Can reach both;Left Entrance Stairs-Number of Steps: 3   Home Layout: One level Home Equipment: Cane - single point      Prior Function Prior Level of Function : Independent/Modified Independent                     Hand Dominance        Extremity/Trunk Assessment   Upper Extremity Assessment Upper Extremity Assessment: Generalized weakness    Lower Extremity Assessment Lower Extremity Assessment: Generalized weakness    Cervical /  Trunk Assessment Cervical / Trunk Assessment: Kyphotic  Communication   Communication: Expressive difficulties (stuttered speech)  Cognition Arousal/Alertness: Awake/alert Behavior During Therapy: WFL for tasks assessed/performed Overall Cognitive Status: History of cognitive impairments - at baseline                                 General Comments:  Dementia at baseline        General Comments General comments (skin integrity, edema, etc.): BP in standing at 95/55 and after ambulation 91/63.    Exercises     Assessment/Plan    PT Assessment Patient needs continued PT services  PT Problem List Decreased strength;Decreased balance;Decreased activity tolerance;Decreased mobility;Decreased cognition;Decreased knowledge of precautions       PT Treatment Interventions DME instruction;Gait training;Functional mobility training;Stair training;Therapeutic activities;Therapeutic exercise;Cognitive remediation;Balance training    PT Goals (Current goals can be found in the Care Plan section)  Acute Rehab PT Goals Patient Stated Goal: to go home PT Goal Formulation: With patient Time For Goal Achievement: 06/19/21 Potential to Achieve Goals: Good    Frequency Min 3X/week     Co-evaluation               AM-PAC PT "6 Clicks" Mobility  Outcome Measure Help needed turning from your back to your side while in a flat bed without using bedrails?: None Help needed moving from lying on your back to sitting on the side of a flat bed without using bedrails?: None Help needed moving to and from a bed to a chair (including a wheelchair)?: A Little Help needed standing up from a chair using your arms (e.g., wheelchair or bedside chair)?: A Little Help needed to walk in hospital room?: A Little Help needed climbing 3-5 steps with a railing? : A Little 6 Click Score: 20    End of Session Equipment Utilized During Treatment: Gait belt Activity Tolerance: Patient tolerated treatment well Patient left: in bed;with call bell/phone within reach (on stretcher in ED) Nurse Communication: Mobility status PT Visit Diagnosis: Other abnormalities of gait and mobility (R26.89);Muscle weakness (generalized) (M62.81)    Time: 2761-4709 PT Time Calculation (min) (ACUTE ONLY): 14 min   Charges:   PT Evaluation $PT Eval Low Complexity: 1 Low           Lou Miner, DPT  Acute Rehabilitation Services  Pager: [phone] Office: [phone]   Rudean Hitt 06/05/2021, 11:25 AM

## 2021-06-05 NOTE — Telephone Encounter (Signed)
Oncology Discharge Planning Admission Note  [facility] Address: [address], [place], [place] [address] Hours of Operation:  8am - 5pm, Monday - Friday  Clinic Contact Information:  [phone]) [phone]  Oncology Care Team: Medical Oncologist:  Dr. Betsy Coder  Contacted Marissa Calamity to inform that the oncology provider Dr. Benay Spice is aware of this hospital admission dated 06/04/21 to ER, and the cancer center will follow Merrilee Jansky inpatient care to assist with discharge planning as indicated by the oncologist.  We will reach out to you closer to discharge date to arrange your follow up care. Informed him that if he is not discharged home today MD will try to have bone marrow biopsy done as inpatient. Will call him w/discharge appointments when d/c date is known.   Disclaimer:  This [place] note does not imply a formal consult request has been made by the admitting attending for this admission or there will be an inpatient consult completed by oncology.  Please request oncology consults as per standard process as indicated.

## 2021-06-05 NOTE — Progress Notes (Signed)
FPTS Brief Progress Note  S: Mr. Hessling is laying in bed comfortably.  Michela Pitcher he is doing well, has no complaint at this time.  He denies any chest pain, palpitations or shortness of breath.   O: BP (!) 93/56    Pulse (!) 57    Temp 97.8 F (36.6 C) (Oral)    Resp 16    Ht 6' (1.829 m)    Wt 68.5 kg    SpO2 99%    BMI 20.48 kg/m    General:Awake, well appearing, NAD HEENT: Atraumatic, MMM CV: RRR, no murmurs, normal S1/S2 Pulm: CTAB, good WOB on RA, no crackles or wheezing Abd: Soft, no distension, no tenderness Skin: dry, warm Ext: No BLE edema, +2 Pedal and radial pulse.    A/P: Presyncope BP is 93/56.  HR 57.  Patient is currently on Metroprolol for A. Fib -We hold p.m. metoprolol for soft BP and bradycardia. -Continue plan per day team. - Orders reviewed. Labs for AM ordered, which was adjusted as needed.    Alen Bleacher, MD 06/05/2021, 9:58 PM PGY-1, [place] Night Resident  Please page [phone] with questions.

## 2021-06-05 NOTE — Progress Notes (Signed)
Chart reviewed-Dr. Andria Frames is patient's PCP-have discussed with family practice teaching service-they will assume primary role while the patient is hospitalized.  [facility] will sign off.

## 2021-06-05 NOTE — Telephone Encounter (Signed)
Patient left voicemail regarding being in the hospital and not being able to come in. Appts were cancelled for today and attempted to call back to reschedule. No answer, voicemail was left for him to return phone call when able to reschedule

## 2021-06-05 NOTE — Care Management Obs Status (Signed)
[place] NOTIFICATION   Patient Details  Name: [name] MRN: [id number] Date of Birth: 26-Mar-1946   Medicare Observation Status Notification Given:       Laurena Slimmer, RN 06/05/2021, 6:53 PM

## 2021-06-05 NOTE — H&P (Signed)
History and Physical    JUWAN VENCES HRC:[id number] DOB: June 07, 1945 DOA: 06/04/2021  PCP: Zenia Resides, MD  Patient coming from: Home.  Chief Complaint: Dizziness.  HPI: [name] is a 76 y.o. 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  Patient states he was walking from the living hall to the kitchen when he felt dizzy.  Did not pass out.  Denies any chest pain or palpitation.  ED Course: In the ER monitor shows atrial flutter rate controlled.  Platelets are around 12 and is being followed by patient's oncologist.  At this time patient be admitted for further observation given the cardiac history.  COVID test is negative.  Review of Systems: As per HPI, rest all negative.   Past Medical History:  Diagnosis Date   Anemia    Atrial fibrillation ([place])    post operative in 2018   Atrial flutter ([place])    Bacteremia 2018   Basal cell carcinoma    COPD (chronic obstructive pulmonary disease) (HCC)    Dementia (HCC)    Diabetes (HCC)    DVT of deep femoral vein (HCC)    Hypertension    Thrombocytopenia ([place])     Past Surgical History:  Procedure Laterality Date   BALLOON ENTEROSCOPY N/A 11/26/2020   Procedure: BALLOON ENTEROSCOPY;  Surgeon: Lavena Bullion, DO;  Location: [facility];  Service: Gastroenterology;  Laterality: N/A;  Midday please   BIOPSY  10/21/2020   Procedure: BIOPSY;  Surgeon: Doran Stabler, MD;  Location: [place];  Service: Gastroenterology;;   BIOPSY  11/21/2020   Procedure: BIOPSY;  Surgeon: Gatha Mayer, MD;  Location: [place];  Service: Endoscopy;;   COLONOSCOPY WITH PROPOFOL N/A 11/22/2020   Procedure: COLONOSCOPY WITH PROPOFOL;  Surgeon: Gatha Mayer, MD;  Location: [place];  Service: Endoscopy;  Laterality: N/A;   CYSTOSCOPY W/ URETERAL STENT  PLACEMENT N/A 10/24/2020   Procedure: CYSTOSCOPY WITH RETROGRADE PYELOGRAM/URETERAL STENT PLACEMENT;  Surgeon: Ardis Hughs, MD;  Location: [place];  Service: Urology;  Laterality: N/A;   CYSTOSCOPY WITH URETHRAL DILATATION  10/24/2020   Procedure: URETHRAL DILATATION;  Surgeon: Ardis Hughs, MD;  Location: [place];  Service: Urology;;   ENTEROSCOPY N/A 11/21/2020   Procedure: ENTEROSCOPY;  Surgeon: Gatha Mayer, MD;  Location: [place];  Service: Endoscopy;  Laterality: N/A;   ESOPHAGOGASTRODUODENOSCOPY (EGD) WITH PROPOFOL N/A 10/21/2020   Procedure: ESOPHAGOGASTRODUODENOSCOPY (EGD) WITH PROPOFOL;  Surgeon: Doran Stabler, MD;  Location: [place];  Service: Gastroenterology;  Laterality: N/A;   GIVENS CAPSULE STUDY N/A 11/22/2020   Procedure: GIVENS CAPSULE STUDY;  Surgeon: Gatha Mayer, MD;  Location: [place];  Service: Endoscopy;  Laterality: N/A;   HOT HEMOSTASIS N/A 11/26/2020   Procedure: HOT HEMOSTASIS (ARGON PLASMA COAGULATION/BICAP);  Surgeon: Lavena Bullion, DO;  Location: [facility];  Service: Gastroenterology;  Laterality: N/A;   MITRAL VALVE REPLACEMENT  07/2016   [facility] for endocarditis   PARTIAL COLECTOMY N/A 10/24/2020   Procedure: OPEN PARTIAL COLECTOMY WTH COLOSTOMY;  Surgeon: Rolm Bookbinder, MD;  Location: [place];  Service: General;  Laterality: N/A;   SIGMOIDOSCOPY  10/21/2020   Procedure: SIGMOIDOSCOPY;  Surgeon: Doran Stabler, MD;  Location: [place];  Service: Gastroenterology;;   SUBMUCOSAL INJECTION  10/21/2020   Procedure: SUBMUCOSAL INJECTION;  Surgeon: Nelida Meuse  III, MD;  Location: [place];  Service: Gastroenterology;;  SPOT   SUBMUCOSAL TATTOO INJECTION  11/26/2020   Procedure: SUBMUCOSAL TATTOO INJECTION;  Surgeon: Lavena Bullion, DO;  Location: [place];  Service: Gastroenterology;;     reports that he has been smoking cigarettes and pipe. He has never used smokeless tobacco. He reports that he  does not currently use alcohol. He reports that he does not currently use drugs.  Allergies  Allergen Reactions   Orange Juice [Orange Oil] Hives    Family History  Problem Relation Age of Onset   Hypertension Other     Prior to Admission medications   Medication Sig Start Date End Date Taking? Authorizing Provider  acetaminophen (TYLENOL) 325 MG tablet Take 2 tablets (650 mg total) by mouth every 6 (six) hours as needed for mild pain or fever. 11/01/20  Yes Norm Parcel, PA-C  albuterol (VENTOLIN HFA) 108 (90 Base) MCG/ACT inhaler Inhale 1-2 puffs into the lungs every 6 (six) hours as needed for wheezing or shortness of breath. 11/01/20  Yes Hongalgi, Lenis Dickinson, MD  aspirin EC 325 MG tablet Take 325 mg by mouth every 4 (four) hours as needed for moderate pain.   Yes [provider]  capecitabine (XELODA) 500 MG tablet Take 2 tabs(1000 mg) in the am. And 2 tabs(1000 mg) in the evening  twice a day for 14 days on and 7 days off. Patient taking differently: Take 500 mg by mouth daily. 01/27/21  Yes Ladell Pier, MD  diltiazem (CARDIZEM CD) 120 MG 24 hr capsule TAKE 1 CAPSULE(120 MG) BY MOUTH AT BEDTIME Patient taking differently: Take 120 mg by mouth daily. 03/04/21  Yes Hensel, Jamal Collin, MD  ferrous sulfate 325 (65 FE) MG tablet Take 1 tablet (325 mg total) by mouth daily. 01/29/21 06/04/21 Yes Hensel, Jamal Collin, MD  metoprolol tartrate (LOPRESSOR) 25 MG tablet Take 1 tablet (25 mg total) by mouth 2 (two) times daily. 01/29/21  Yes Hensel, Jamal Collin, MD  pantoprazole (PROTONIX) 40 MG tablet Take 1 tablet (40 mg total) by mouth daily at 6 (six) AM. Patient taking differently: Take 40 mg by mouth daily. 01/29/21  Yes Hensel, Jamal Collin, MD  SPIRIVA HANDIHALER 18 MCG inhalation capsule PLACE ONE CAPSULE INTO THE INHALER AND INHALE DAILY Patient taking differently: Place 18 mcg into inhaler and inhale daily. 03/04/21  Yes Hensel, Jamal Collin, MD  apixaban (ELIQUIS) 5 MG TABS tablet Take 1  tablet (5 mg total) by mouth 2 (two) times daily. Patient not taking: Reported on 12/06/2020 11/01/20   Modena Jansky, MD    Physical Exam: Constitutional: Moderately built and nourished. Vitals:   06/04/21 2054 06/04/21 2200 06/05/21 0100 06/05/21 0130  BP:  107/65 105/63 106/64  Pulse:  (!) 57 (!) 56 (!) 56  Resp:  12 14 10   Temp:      TempSrc:      SpO2:  96% 93% 91%  Weight: 68.5 kg     Height: 6' (1.829 m)      Eyes: Anicteric no pallor. ENMT: No discharge from the ears eyes nose and mouth. Neck: No mass felt.  No neck rigidity. Respiratory: No rhonchi or crepitations. Cardiovascular: S1-S2 heard. Abdomen: Soft nontender bowel sound present. Musculoskeletal: No edema.  Petechial changes in the lower extremity. Skin: Petechial changes in lower extremity. Neurologic: Alert awake oriented time place and person.  Moves all extremities. Psychiatric: Appears normal.  Normal affect.   Labs on Admission: I have  personally reviewed following labs and imaging studies  CBC: Recent Labs  Lab 06/04/21 2137  WBC 2.3*  NEUTROABS 1.1*  HGB 9.4*  HCT 29.2*  MCV 85.6  PLT 12*   Basic Metabolic Panel: Recent Labs  Lab 06/04/21 2137  NA 138  K 4.2  CL 106  CO2 27  GLUCOSE 87  BUN 27*  CREATININE 1.25*  CALCIUM 8.6*  MG 2.3   GFR: Estimated Creatinine Clearance: 48.7 mL/min (A) (by C-G formula based on SCr of 1.25 mg/dL (H)). Liver Function Tests: Recent Labs  Lab 06/04/21 2137  AST 21  ALT 23  ALKPHOS 69  BILITOT 0.8  PROT 6.4*  ALBUMIN 3.2*   No results for input(s): LIPASE, AMYLASE in the last 168 hours. No results for input(s): AMMONIA in the last 168 hours. Coagulation Profile: Recent Labs  Lab 06/04/21 2137  INR 1.2   Cardiac Enzymes: No results for input(s): CKTOTAL, CKMB, CKMBINDEX, TROPONINI in the last 168 hours. BNP (last 3 results) No results for input(s): PROBNP in the last 8760 hours. HbA1C: No results for input(s): HGBA1C in the last 72  hours. CBG: No results for input(s): GLUCAP in the last 168 hours. Lipid Profile: No results for input(s): CHOL, HDL, LDLCALC, TRIG, CHOLHDL, LDLDIRECT in the last 72 hours. Thyroid Function Tests: Recent Labs    06/04/21 2137  TSH 1.437   Anemia Panel: No results for input(s): VITAMINB12, FOLATE, FERRITIN, TIBC, IRON, RETICCTPCT in the last 72 hours. Urine analysis:    Component Value Date/Time   COLORURINE YELLOW 10/28/2020 1050   APPEARANCEUR HAZY (A) 10/28/2020 1050   LABSPEC 1.023 10/28/2020 1050   PHURINE 5.0 10/28/2020 1050   GLUCOSEU NEGATIVE 10/28/2020 1050   HGBUR MODERATE (A) 10/28/2020 1050   BILIRUBINUR NEGATIVE 10/28/2020 1050   KETONESUR 20 (A) 10/28/2020 1050   PROTEINUR 30 (A) 10/28/2020 1050   NITRITE NEGATIVE 10/28/2020 1050   LEUKOCYTESUR LARGE (A) 10/28/2020 1050   Sepsis Labs: @LABRCNTIP (procalcitonin:4,lacticidven:4) )No results found for this or any previous visit (from the past 240 hour(s)).   Radiological Exams on Admission: DG Chest Portable 1 View  Result Date: 06/04/2021 CLINICAL DATA:  Dizziness, near syncope EXAM: PORTABLE CHEST 1 VIEW COMPARISON:  11/20/2020 FINDINGS: Single frontal view of the chest demonstrates postsurgical changes from CABG. Cardiac silhouette is stable. Mild central vascular congestion without airspace disease, effusion, or pneumothorax. No acute bony abnormalities. IMPRESSION: 1. Mild central vascular congestion without overt edema. Electronically Signed   By: Randa Ngo M.D.   On: 06/04/2021 21:35    EKG: Independently reviewed.  Atrial flutter rate controlled.  Assessment/Plan Principal Problem:   Near syncope Active Problems:   Hx of CABG   H/O mitral valve replacement   Atrial flutter (HCC)   PAF (paroxysmal atrial fibrillation) (HCC)   Thrombocytopenia (HCC)    Near syncope -patient felt dizzy on walking.  Will check orthostatics.  Continue to monitor in telemetry. A flutter rate is controlled on Cardizem  and beta-blockers.  Not on anticoagulation secondary to severe thrombocytopenia. Severe thrombocytopenia being followed by Dr. Betsy Coder patient's oncologist.  No signs of any bleeding. Chronic anemia follow CBC. History of mitral valve replacement bioprosthetic secondary to endocarditis and history of CABG. History of colon cancer being followed by oncologist.   DVT prophylaxis: SCDs.  Avoiding anticoagulation secondary to severe thrombocytopenia. Code Status: Full code. Family Communication: Discussed with patient. Disposition Plan: Home. Consults called: Physical therapy. Admission status: Observation.   Rise Patience MD [facility]  Pager [phone].  If 7PM-7AM, please contact night-coverage [email or web address] Password [email or web address]  06/05/2021, 1:54 AM

## 2021-06-06 DIAGNOSIS — Z951 Presence of aortocoronary bypass graft: Secondary | ICD-10-CM | POA: Diagnosis not present

## 2021-06-06 DIAGNOSIS — D696 Thrombocytopenia, unspecified: Secondary | ICD-10-CM | POA: Diagnosis not present

## 2021-06-06 DIAGNOSIS — R55 Syncope and collapse: Secondary | ICD-10-CM | POA: Diagnosis not present

## 2021-06-06 DIAGNOSIS — I484 Atypical atrial flutter: Secondary | ICD-10-CM | POA: Diagnosis not present

## 2021-06-06 LAB — CBC
HCT: 30.8 % — ABNORMAL LOW (ref 39.0–52.0)
HCT: 31.5 % — ABNORMAL LOW (ref 39.0–52.0)
Hemoglobin: 10.1 g/dL — ABNORMAL LOW (ref 13.0–17.0)
Hemoglobin: 10.1 g/dL — ABNORMAL LOW (ref 13.0–17.0)
MCH: 27.4 pg (ref 26.0–34.0)
MCH: 27.3 pg (ref 26.0–34.0)
MCHC: 32.8 g/dL (ref 30.0–36.0)
MCHC: 32.1 g/dL (ref 30.0–36.0)
MCV: 83.5 fL (ref 80.0–100.0)
MCV: 85.1 fL (ref 80.0–100.0)
Platelets: 13 10*3/uL — CL (ref 150–400)
Platelets: 11 10*3/uL — CL (ref 150–400)
RBC: 3.69 MIL/uL — ABNORMAL LOW (ref 4.22–5.81)
RBC: 3.7 MIL/uL — ABNORMAL LOW (ref 4.22–5.81)
RDW: 16 % — ABNORMAL HIGH (ref 11.5–15.5)
RDW: 16.3 % — ABNORMAL HIGH (ref 11.5–15.5)
WBC: 2.7 10*3/uL — ABNORMAL LOW (ref 4.0–10.5)
WBC: 2.7 10*3/uL — ABNORMAL LOW (ref 4.0–10.5)
nRBC: 0 % (ref 0.0–0.2)
nRBC: 0 % (ref 0.0–0.2)

## 2021-06-06 LAB — DIFFERENTIAL
Abs Immature Granulocytes: 0.02 10*3/uL (ref 0.00–0.07)
Basophils Absolute: 0 10*3/uL (ref 0.0–0.1)
Basophils Relative: 0 %
Eosinophils Absolute: 0 10*3/uL (ref 0.0–0.5)
Eosinophils Relative: 1 %
Immature Granulocytes: 1 %
Lymphocytes Relative: 33 %
Lymphs Abs: 0.9 10*3/uL (ref 0.7–4.0)
Monocytes Absolute: 0.3 10*3/uL (ref 0.1–1.0)
Monocytes Relative: 12 %
Neutro Abs: 1.4 10*3/uL — ABNORMAL LOW (ref 1.7–7.7)
Neutrophils Relative %: 53 %

## 2021-06-06 LAB — FERRITIN: Ferritin: 39 ng/mL (ref 24–336)

## 2021-06-06 MED ORDER — METOPROLOL TARTRATE 25 MG PO TABS: 25.0000 mg | ORAL_TABLET | Freq: Two times a day (BID) | ORAL | Status: DC | PRN

## 2021-06-06 NOTE — Progress Notes (Signed)
Went to round on patient at the start of shift.  Patient was sleeping comfortably when I entered the room and so I did not wake him.  His vitals on the monitor were within normal limits at the time and he was in no distress.  BP 117/69 (BP Location: Right Arm)    Pulse 80    Temp 98.2 F (36.8 C) (Oral)    Resp 20    Ht 6' (1.829 m)    Wt 68.5 kg    SpO2 100%    BMI 20.48 kg/m   General: Sleeping comfortably Respiratory: No respiratory distress on room air  A/P Hematology is on board and plans for bone marrow biopsy next week due to his thrombocytopenia.  We will monitor his platelet count on CBC tomorrow morning and if platelets are less than 10,000 we will replace.  Remainder per daytime progress note

## 2021-06-06 NOTE — Progress Notes (Signed)
IP PROGRESS NOTE  Subjective:   [name] is well-known to me with a history of colon cancer, basal cell carcinoma, and thrombocytopenia.  He was admitted 06/04/2021 with a presyncope event.  He has no complaint today.  He denies bleeding and fever. He notes the basal cell carcinomas of the left forearm are smaller.  He continues Odomzo. He has developed progressive thrombocytopenia and anemia/leukopenia over the past several months.  He has been scheduled for bone marrow biopsy, but this was canceled secondary to family emergency.  The bone marrow biopsy is rescheduled for later this month. Objective: Vital signs in last 24 hours: Blood pressure 105/68, pulse 72, temperature 98.1 F (36.7 C), temperature source Oral, resp. rate (!) 7, height 6' (1.829 m), weight 151 lb (68.5 kg), SpO2 96 %.  Intake/Output from previous day: 01/05 0701 - 01/06 0700 In: -  Out: 900 [Urine:900]  Physical Exam:  HEENT: No thrush or bleeding Lungs: Bilateral end inspiratory rhonchi, no respiratory distress Cardiac: Regular rhythm Abdomen: No hepatosplenomegaly, left abdomen colostomy with brown stool Extremities: No leg edema Skin: Scattered ecchymoses over the extremities, petechiae of the lower legs.  Scabbing and areas of previous large basal cell carcinomas at the left forearm and wrist   Lab Results: Recent Labs    06/05/21 0620 06/06/21 0500  WBC 1.6* 2.7*  HGB 9.5* 10.1*  HCT 29.2* 30.8*  PLT 13* 13*  Blood smear 06/05/2021-markedly decreased platelets, decreased white cells with the majority being mature neutrophils and lymphocytes, no blasts.  Numerous ovalocytes, acanthocytes, and a few "cigar "cells.  No nucleated red cells.  BMET Recent Labs    06/04/21 2137 06/05/21 0620  NA 138 136  K 4.2 5.0  CL 106 108  CO2 27 24  GLUCOSE 87 82  BUN 27* 23  CREATININE 1.25* 1.10  CALCIUM 8.6* 8.4*    Lab Results  Component Value Date   CEA1 3.9 10/22/2020    Studies/Results: MR  BRAIN WO CONTRAST  Result Date: 06/05/2021 CLINICAL DATA:  76 year old male with neurologic deficit. EXAM: MRI HEAD WITHOUT CONTRAST TECHNIQUE: Multiplanar, multiecho pulse sequences of the brain and surrounding structures were obtained without intravenous contrast. COMPARISON:  None. FINDINGS: Brain: Cerebral volume is within normal limits for age. No restricted diffusion to suggest acute infarction. No midline shift, mass effect, evidence of mass lesion, ventriculomegaly, extra-axial collection or acute intracranial hemorrhage. Cervicomedullary junction and pituitary are within normal limits. Scattered bilateral cerebral white matter T2 and FLAIR hyperintensity is moderately advanced, Patchy and confluent in the periatrial white matter. And there are scattered chronic microhemorrhages in the brain, although less than typical of amyloid angiopathy. Still, the chronic blood products are fairly numerous in the posterior circulation including the cerebellum. No cortical encephalomalacia identified. Deep gray matter nuclei and brainstem are within normal limits. Vascular: Major intracranial vascular flow voids are preserved. Skull and upper cervical spine: Negative visible cervical spine. Normal visible bone marrow signal. Sinuses/Orbits: Negative orbits. Moderate bilateral maxillary sinus mucosal thickening. Some bubbly opacity but no sinus fluid levels. Other: Mastoids are clear. Visible internal auditory structures appear normal. Small volume retained secretions in the nasopharynx. Negative visible scalp and face. IMPRESSION: 1. No acute intracranial abnormality. 2. Moderately advanced signal changes in the cerebral white matter with fairly widely scattered chronic micro-hemorrhages, although NOT rising to a level typical of amyloid angiopathy. Electronically Signed   By: Genevie Ann M.D.   On: 06/05/2021 06:39   DG Chest Portable 1 View  Result Date:  06/04/2021 CLINICAL DATA:  Dizziness, near syncope EXAM: PORTABLE  CHEST 1 VIEW COMPARISON:  11/20/2020 FINDINGS: Single frontal view of the chest demonstrates postsurgical changes from CABG. Cardiac silhouette is stable. Mild central vascular congestion without airspace disease, effusion, or pneumothorax. No acute bony abnormalities. IMPRESSION: 1. Mild central vascular congestion without overt edema. Electronically Signed   By: Randa Ngo M.D.   On: 06/04/2021 21:35    Medications: I have reviewed the patient's current medications.  Assessment/Plan: Sigmoid colon adenocarcinoma -Colonoscopy 10/21/2020- partially obstructing mass found in the sigmoid colon consistent with adenocarcinoma. -CEA on 10/22/2020 was 3.9 -CTs 10/22/2020-colonic mass with pelvic lymphadenopathy including right common iliac and external iliac nodes, small pulmonary nodules -10/24/2020-sigmoid colectomy, end colostomy, tumor stuck to pelvic sidewall with rind of tissue remaining at completion of surgery -Pathology- moderately differentiated adenocarcinoma the sigmoid colon, tumor extends into pericolonic connective tissue, no lymphovascular perineural invasion, 0/14 lymph nodes, carcinoma involves an area with the specimen was disrupted at mesenteric margin; mismatch repair protein (IHC) normal; MSI stable -CTs 11/24/2020-interval resection of colonic mass, decreased size of previously noted right pelvic adenopathy, new noted at the right internal/external iliac bifurcation, unchanged pulmonary nodules -Guardant reveal 12/06/2020-ctDNA not detected -Cycle 1 adjuvant Xeloda anticipated 01/06/2021 -Cycle 2 adjuvant Xeloda 01/27/2021, Xeloda dose reduced secondary to thrombocytopenia (he took 1 pill a day for an unclear amount of time, less than 14 days) Cycle 3 adjuvant Xeloda 02/17/2021 2.  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 3.  Thrombocytopenia-chronic 4.  Atrial flutter 5.  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  COPD 7.   Hypertension 8.  Diabetes mellitus 9.  Dementia 10.  Tobacco dependence 11. Left wrist basal cell carcinoma -Skin biopsy 08/16/2020 - Basal cell carcinoma, nodular and infiltrative patterns, peripheral and deep margins involved -Seen by radiation oncology 09/03/2020 -Established care with the wound center on 09/13/2019 -Placed on Sonidegib by dermatology, marked clinical improvement 12.  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  Admission 06/05/2021 with presyncope   [name] has developed progressive pancytopenia over the past several months.  The etiology of the pancytopenia is unclear.  He has a history of chronic thrombocytopenia, but this has progressed significantly over recent months.  He now has progressive anemia and leukopenia.  The peripheral blood smear is concerning for a bone marrow process.  The differential diagnosis includes a lymphoproliferative disorder and myelodysplasia.  I doubt the hematologic findings are related to Sonidegib as the progressive thrombocytopenia predates the start of this agent. I recommend proceeding with a diagnostic bone marrow biopsy as soon as possible.  He has experienced dramatic improvement in the left arm basal cell carcinomas while on Sonidegib  Recommendations: Hold  Sonidegib Check ferritin Transfuse 1 unit of a pheresis platelets for a count of less than 10,000 or bleeding Proceed with diagnostic bone marrow biopsy as soon as possible Management of atrial flutter per the medical service Blood and urine cultures, broad-spectrum intravenous antibiotics for a fever spike Daily CBC  with differential 8.   Please call hematology/oncology as needed over the weekend.  I will check on him 06/09/2021    LOS: 0 days   Betsy Coder, MD   06/06/2021,  8:20 AM

## 2021-06-06 NOTE — ED Notes (Signed)
Pt is in A-flutter

## 2021-06-06 NOTE — Progress Notes (Signed)
Family Medicine Teaching Service Daily Progress Note Intern Pager: [phone]  Patient name: [name] Medical record number: [id number] Date of birth: 05-22-1946 Age: 76 y.o. Gender: male  Primary Care Provider: Zenia Resides, MD Consultants: Heme/onc Code Status: Full  Pt Overview and Major Events to Date:  06/04/2021-admitted  Assessment and Plan: [name] is a 76 y.o. 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   Presyncope Patient with no complaints of syncope or presyncope in the past 24 hours.  Blood pressure over the last 24 hours ranged from 105-128/59-78.  He did receive a 500 cc bolus yesterday for low pressures.  Pancytopenia   colon cancer - adenocarcinoma Hematology/oncology are on board with regard to the patient's pancytopenia.  There was a thought that it may be due to his Sonidegib and this medicine was held.  IR plans to do a bone marrow biopsy on Monday or Tuesday of next week.  AM platelet level 12,000. -Continue to monitor platelet level -Hematology/oncology following, appreciate recommendations -Holding Sonidegib -We will transfuse 1 unit of platelets for count less than 10,000 -Plan for bone marrow biopsy early next week as above -We will monitor for fevers and start broad-spectrum antibiotics as well as blood and urine cultures if the patient develops any -Daily CBC  A flutter Most recent EKG with atrial flutter with 4-1 block.  Patient has had some bouts of hypotension as documented above.  His metoprolol is being held.  FEN/GI: Regular diet PPx: SCDs only due to severe thrombocytopenia Dispo: Home pending clinical improvement.  Barriers include bone marrow biopsy early next week.  Subjective:  Patient states he is hopeful to go home as soon as possible. He denies any further bouts of  dizziness.  I explained to him with his low platelet count we need to do a work-up to ensure that we can find an answer for why this is occurring.  He was somewhat dismissive of this stating that he wants to go home soon as possible.  He had no other complaints or concerns.  Objective: Temp:  [98 F (36.7 C)-98.2 F (36.8 C)] 98 F (36.7 C) (01/06 2214) Pulse Rate:  [44-96] 96 (01/06 2214) Resp:  [7-24] 15 (01/06 2214) BP: (77-128)/(45-78) 128/78 (01/06 2214) SpO2:  [92 %-100 %] 100 % (01/06 2214) Physical Exam: General: Elderly male lying in bed Cardiovascular: S1, S2 present, no murmurs Respiratory: Clear bilaterally Abdomen: No abdominal discomfort to palpation  Laboratory: Recent Labs  Lab 06/04/21 2137 06/05/21 0620 06/06/21 0500  WBC 2.3* 1.6* 2.7*   2.7*  HGB 9.4* 9.5* 10.1*   10.1*  HCT 29.2* 29.2* 31.5*   30.8*  PLT 12* 13* 11*   13*   Recent Labs  Lab 06/04/21 2137 06/05/21 0620  NA 138 136  K 4.2 5.0  CL 106 108  CO2 27 24  BUN 27* 23  CREATININE 1.25* 1.10  CALCIUM 8.6* 8.4*  PROT 6.4*  --   BILITOT 0.8  --   ALKPHOS 69  --   ALT 23  --   AST 21  --   GLUCOSE 87 [address], DO 06/06/2021, 10:21 PM PGY-3, [place] Intern pager: [phone], text pages welcome

## 2021-06-06 NOTE — Progress Notes (Signed)
Pt refused nebulizer after medication was opened and nebulizer was set up. Pt is clear/diminished breath sounds and resting comfortably. RT will cont to monitor.

## 2021-06-06 NOTE — Progress Notes (Signed)
Family Medicine Teaching Service Daily Progress Note Intern Pager: [phone]  Patient name: [name] Medical record number: [id number] Date of birth: 06/07/1945 Age: 76 y.o. Gender: male  Primary Care Provider: Zenia Resides, MD Consultants: Heme Onc Code Status: Full  Pt Overview and Major Events to Date:  06/04/21 - Patient admitted  Assessment and Plan:  [name] is a 76 y.o. 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    Pre-Syncope Patient with no complaints of syncope or presyncope. BP's stable, with pressures ranging from 90-100's/40-60's. Patient with notable diastolic hypotension overnight -500 cc bolus  Pancytopenia   Colon Cancer Adenocarcinoma Heme onc following and concerned about pancytopenia. Hem Oncy thinking pancytopenia may be 2/2 Sonidegib use. Recommending holding medicine. IR to attempt bone marrow biopsy on Monday 1/9 or Tuesday 1/10. -Continue to trend -Oncology following, appreciate recs -Hold Sonidegib -Check Ferritin -Transfuse 1 unit of pheresis platelets for count less than 10,000 -Bone marrow biopsy -Bcx, Ucx, broad spectrum abx if patient spikes fever -Daily CBC w/ diff    PAF/ A. Flutter Rate controlled, 3:1 A. Flutter on last EKG. Patient blood pressures soft, will hold metoprolol and give prn for HR > 110. -d/c Metoprolol tartate 58m BID -Metoprolol 25 mg prn for HR > 110,   COPD -Incruse ellipta 62.5 -Albuterol orn  FEN/GI: Regular Diet PPx: SCD's, avoiding anticoagulation 2/2 severe thrombocytopenia Dispo:Home pending clinical improvement . Barriers include patient needing bone marrow biopsy.   Subjective:  NAEON, patient comfortable, no signs of bleeding.   Objective: Temp:  [97.8 F (36.6 C)] 97.8 F (36.6 C) (01/05 1550) Pulse Rate:  [45-72] 59 (01/06  0445) Resp:  [10-23] 13 (01/06 0445) BP: (91-132)/(45-73) 106/63 (01/06 0445) SpO2:  [94 %-100 %] 96 % (01/06 0445) Physical Exam: General: Frail, elderly, NAD, white male Cardiovascular: RRR, NVailRespiratory: CTABL Abdomen: Soft, NTTP, non-distended Extremities: Moving all extremities independently  Laboratory: Recent Labs  Lab 06/04/21 2137 06/05/21 0620  WBC 2.3* 1.6*  HGB 9.4* 9.5*  HCT 29.2* 29.2*  PLT 12* 13*   Recent Labs  Lab 06/04/21 2137 06/05/21 0620  NA 138 136  K 4.2 5.0  CL 106 108  CO2 27 24  BUN 27* 23  CREATININE 1.25* 1.10  CALCIUM 8.6* 8.4*  PROT 6.4*  --   BILITOT 0.8  --   ALKPHOS 69  --   ALT 23  --   AST 21  --   GLUCOSE 87 82      Imaging/Diagnostic Tests:   SHolley Bouche MD 06/06/2021, 6:17 AM PGY-1, [place] pager: [phone] text pages welcome

## 2021-06-07 DIAGNOSIS — I959 Hypotension, unspecified: Secondary | ICD-10-CM | POA: Diagnosis not present

## 2021-06-07 DIAGNOSIS — D696 Thrombocytopenia, unspecified: Secondary | ICD-10-CM | POA: Diagnosis not present

## 2021-06-07 DIAGNOSIS — R55 Syncope and collapse: Secondary | ICD-10-CM | POA: Diagnosis not present

## 2021-06-07 DIAGNOSIS — I484 Atypical atrial flutter: Secondary | ICD-10-CM | POA: Diagnosis not present

## 2021-06-07 LAB — URINALYSIS, ROUTINE W REFLEX MICROSCOPIC
Bilirubin Urine: NEGATIVE
Glucose, UA: NEGATIVE mg/dL
Ketones, ur: NEGATIVE mg/dL
Leukocytes,Ua: NEGATIVE
Nitrite: NEGATIVE
Protein, ur: NEGATIVE mg/dL
Specific Gravity, Urine: 1.02 (ref 1.005–1.030)
pH: 6 (ref 5.0–8.0)

## 2021-06-07 LAB — URINALYSIS, MICROSCOPIC (REFLEX)

## 2021-06-07 LAB — BASIC METABOLIC PANEL
Anion gap: 5 (ref 5–15)
BUN: 29 mg/dL — ABNORMAL HIGH (ref 8–23)
CO2: 26 mmol/L (ref 22–32)
Calcium: 9.2 mg/dL (ref 8.9–10.3)
Chloride: 104 mmol/L (ref 98–111)
Creatinine, Ser: 1.2 mg/dL (ref 0.61–1.24)
GFR, Estimated: >60 mL/min (ref >60)
Glucose, Bld: 82 mg/dL (ref 70–99)
Potassium: 4.3 mmol/L (ref 3.5–5.1)
Sodium: 135 mmol/L (ref 135–145)

## 2021-06-07 LAB — CBC WITH DIFFERENTIAL/PLATELET
Abs Immature Granulocytes: 0.01 10*3/uL (ref 0.00–0.07)
Basophils Absolute: 0 10*3/uL (ref 0.0–0.1)
Basophils Relative: 0 %
Eosinophils Absolute: 0 10*3/uL (ref 0.0–0.5)
Eosinophils Relative: 0 %
HCT: 32.2 % — ABNORMAL LOW (ref 39.0–52.0)
Hemoglobin: 10.8 g/dL — ABNORMAL LOW (ref 13.0–17.0)
Immature Granulocytes: 0 %
Lymphocytes Relative: 39 %
Lymphs Abs: 1 10*3/uL (ref 0.7–4.0)
MCH: 27.7 pg (ref 26.0–34.0)
MCHC: 33.5 g/dL (ref 30.0–36.0)
MCV: 82.6 fL (ref 80.0–100.0)
Monocytes Absolute: 0.3 10*3/uL (ref 0.1–1.0)
Monocytes Relative: 11 %
Neutro Abs: 1.2 10*3/uL — ABNORMAL LOW (ref 1.7–7.7)
Neutrophils Relative %: 50 %
Platelets: 12 10*3/uL — CL (ref 150–400)
RBC: 3.9 MIL/uL — ABNORMAL LOW (ref 4.22–5.81)
RDW: 16 % — ABNORMAL HIGH (ref 11.5–15.5)
WBC: 2.5 10*3/uL — ABNORMAL LOW (ref 4.0–10.5)
nRBC: 0 % (ref 0.0–0.2)

## 2021-06-07 MED ORDER — NICOTINE 14 MG/24HR TD PT24
14.0000 mg | MEDICATED_PATCH | Freq: Every day | TRANSDERMAL | Status: DC
  Administered 2021-06-07 – 2021-06-10 (×4): 14 mg via TRANSDERMAL
  Filled 2021-06-07 (×4): qty 1

## 2021-06-07 MED ORDER — METOPROLOL TARTRATE 25 MG PO TABS
25.0000 mg | ORAL_TABLET | Freq: Two times a day (BID) | ORAL | Status: DC | PRN
  Administered 2021-06-07: 25 mg via ORAL
  Filled 2021-06-07: qty 1

## 2021-06-07 NOTE — Progress Notes (Signed)
°   06/07/21 0813  Assess: MEWS Score  Level of Consciousness Alert  Assess: MEWS Score  MEWS Temp 0  MEWS Systolic 0  MEWS Pulse 2  MEWS RR 0  MEWS LOC 0  MEWS Score 2  MEWS Score Color Yellow  Assess: if the MEWS score is Yellow or Red  Were vital signs taken at a resting state? Yes  Focused Assessment Change from prior assessment (see assessment flowsheet)  Early Detection of Sepsis Score *See Row Information* Low  MEWS guidelines implemented *See Row Information* Yes  Take Vital Signs  Increase Vital Sign Frequency  Yellow: Q 2hr X 2 then Q 4hr X 2, if remains yellow, continue Q 4hrs  Escalate  MEWS: Escalate Yellow: discuss with charge nurse/RN and consider discussing with provider and RRT  Notify: Charge Nurse/RN  Name of Charge Nurse/RN Notified brooke rn  Date Charge Nurse/RN Notified 06/07/21  Time Charge Nurse/RN Notified [phone]

## 2021-06-07 NOTE — Progress Notes (Signed)
FPTS Brief Progress Note  S: Patient was resting comfortably, sleeping in bed. Did not wake patient.    O: BP 90/62 (BP Location: Left Arm)    Pulse (!) 110    Temp 99.1 F (37.3 C) (Oral)    Resp 18    Ht 6' (1.829 m)    Wt 68.5 kg    SpO2 100%    BMI 20.48 kg/m   General: sleeping comfortably  CV: tachycardic on telemetry to 119 Respiratory: breathing comfortably during sleep  A/P: Atrial flutter in the setting of hypotension - Orders reviewed. Labs for AM ordered, which was adjusted as needed.  - Patient's blood pressures remain on the lower side and given recent hypotensive episodes and presyncope, hesitate to restart tonight. Patient is stable at this time and we will continue to closely monitor heart rates. Consideration to consult with cardiology if worsening or if persistent with lower blood pressures during the day.   Rise Patience, DO 06/08/2021, 1:37 AM PGY-2, [facility] Night Resident  Please page [phone] with questions.

## 2021-06-08 DIAGNOSIS — R55 Syncope and collapse: Secondary | ICD-10-CM | POA: Diagnosis not present

## 2021-06-08 DIAGNOSIS — D696 Thrombocytopenia, unspecified: Secondary | ICD-10-CM | POA: Diagnosis not present

## 2021-06-08 DIAGNOSIS — I959 Hypotension, unspecified: Secondary | ICD-10-CM | POA: Diagnosis not present

## 2021-06-08 DIAGNOSIS — I484 Atypical atrial flutter: Secondary | ICD-10-CM | POA: Diagnosis not present

## 2021-06-08 LAB — BASIC METABOLIC PANEL
Anion gap: 6 (ref 5–15)
BUN: 32 mg/dL — ABNORMAL HIGH (ref 8–23)
CO2: 27 mmol/L (ref 22–32)
Calcium: 9 mg/dL (ref 8.9–10.3)
Chloride: 105 mmol/L (ref 98–111)
Creatinine, Ser: 1.27 mg/dL — ABNORMAL HIGH (ref 0.61–1.24)
GFR, Estimated: 59 mL/min — ABNORMAL LOW (ref >60)
Glucose, Bld: 102 mg/dL — ABNORMAL HIGH (ref 70–99)
Potassium: 4.7 mmol/L (ref 3.5–5.1)
Sodium: 138 mmol/L (ref 135–145)

## 2021-06-08 LAB — CBC WITH DIFFERENTIAL/PLATELET
Abs Immature Granulocytes: 0.02 10*3/uL (ref 0.00–0.07)
Basophils Absolute: 0 10*3/uL (ref 0.0–0.1)
Basophils Relative: 0 %
Eosinophils Absolute: 0 10*3/uL (ref 0.0–0.5)
Eosinophils Relative: 0 %
HCT: 33 % — ABNORMAL LOW (ref 39.0–52.0)
Hemoglobin: 10.8 g/dL — ABNORMAL LOW (ref 13.0–17.0)
Immature Granulocytes: 1 %
Lymphocytes Relative: 23 %
Lymphs Abs: 0.9 10*3/uL (ref 0.7–4.0)
MCH: 27.6 pg (ref 26.0–34.0)
MCHC: 32.7 g/dL (ref 30.0–36.0)
MCV: 84.2 fL (ref 80.0–100.0)
Monocytes Absolute: 0.5 10*3/uL (ref 0.1–1.0)
Monocytes Relative: 13 %
Neutro Abs: 2.5 10*3/uL (ref 1.7–7.7)
Neutrophils Relative %: 63 %
Platelets: 12 10*3/uL — CL (ref 150–400)
RBC: 3.92 MIL/uL — ABNORMAL LOW (ref 4.22–5.81)
RDW: 16.3 % — ABNORMAL HIGH (ref 11.5–15.5)
WBC: 3.9 10*3/uL — ABNORMAL LOW (ref 4.0–10.5)
nRBC: 0 % (ref 0.0–0.2)

## 2021-06-08 MED ORDER — DIGOXIN 0.25 MG/ML IJ SOLN
0.2500 mg | Freq: Once | INTRAMUSCULAR | Status: AC
  Administered 2021-06-08 – 2021-06-09 (×2): 0.25 mg via INTRAVENOUS
  Filled 2021-06-08 (×3): qty 1

## 2021-06-08 MED ORDER — SODIUM CHLORIDE 0.9 % IV SOLN: INTRAVENOUS | Status: DC

## 2021-06-08 MED ORDER — DIGOXIN 125 MCG PO TABS
0.1250 mg | ORAL_TABLET | Freq: Every day | ORAL | Status: DC
  Administered 2021-06-09 – 2021-06-10 (×2): 0.125 mg via ORAL
  Filled 2021-06-08 (×2): qty 1

## 2021-06-08 MED ORDER — DIGOXIN 0.25 MG/ML IJ SOLN
0.2500 mg | Freq: Once | INTRAMUSCULAR | Status: AC
  Administered 2021-06-09: 0.25 mg via INTRAVENOUS
  Filled 2021-06-08 (×2): qty 1

## 2021-06-08 NOTE — Progress Notes (Signed)
Patient notified RN that had felt dizzy while standing at sink.  Dr. Caron Presume notified.  New order received for orthostatics on the patient.   Dr Caron Presume notified of orthostatics, no new orders received.

## 2021-06-08 NOTE — Progress Notes (Signed)
Saw patient at the start of night shift.  Patient had no concerns or complaints.  He was in no distress.  He did mention that he felt dizzy when standing at the sink brushing his teeth earlier today.  He has not had any further bouts of dizziness since then.  He denies any shortness of breath or chest pain.  BP 124/88 (BP Location: Left Arm)    Pulse (!) 101    Temp 97.9 F (36.6 C) (Oral)    Resp 18    Ht 6' (1.829 m)    Wt 68.5 kg    SpO2 98%    BMI 20.48 kg/m  General: Alert and oriented in no apparent distress Heart: S1, S2 present Lungs: CTA bilaterally, no wheezing Skin: Warm and dry  A/P No concerns or complaints tonight.  We will continue to monitor his platelet level and will give additional platelets if less than 10,000.  Additional changes from today include cardiology being consulted.  Remainder per daytime progress note

## 2021-06-08 NOTE — Consult Note (Signed)
Cardiology Consultation:   Patient ID: [name] MRN: [id number]; DOB: September 20, 1945  Admit date: 06/04/2021 Date of Consult: 06/08/2021  PCP:  Shawn Resides, MD   [facility] Providers Cardiologist:  Shawn Heinz, MD }     Patient Profile:   [name] is a 76 y.o. male with a hx of atrial flutter  who is being seen 06/08/2021 for the evaluation of presyncope  at the request of Dr Andria Frames.  History of Present Illness:   [name] is a 76 y.o. gentleman who is admitted for evaluation of dizziness.  He has a history of colon cancer, chronic thrombocytopenia, atrial fibrillation.  He has a history of mitral valve disease and is status post mitral valve replacement (bioprosthesis) due to endocartditis  This was done at [facility] in 2018  (MVR Medtronic Mosaic 31 mm porcine, 07/14/16)  Post op had afib requiring cardioversion and amiodaron/metoprolol  No anticoagulation  The pt went to the emergency room on 06/04/21 complaining of dizziness.  By report from record, the pt was walking from the living room to the kitchen at home and got dizzy.  He did not pass out.  In the emergency room telemetry showed atrial flutter, rates controlled.  Blood pressures 100s to 120s /60s.  Pulse in the 50s.  At home, patient maintained on Cardizem 120, metoprolol 25 twice daily, Eliquis. On 1/5 comfortable in bed  BP 90s   HR 50   (? Pulse vs telemetry) On 1/7  BP 77 to 128/    Today HR 110s to 120s   BP 90 to 107  With standing BP dropped into the 70s     Pt is a poor historian  He denies dizziness  No CP   Breathing is OK   Pt says he had heart surgery here 1 year ago    Heme/onc also seeing patient    Plan for BM Bx tomorrow   Past Medical History:  Diagnosis Date   Anemia    Atrial fibrillation ([place])    post operative in 2018   Atrial flutter ([place])    Bacteremia 2018   Basal cell carcinoma    COPD (chronic obstructive pulmonary disease) (HCC)    Dementia (HCC)     Diabetes ([place])    DVT of deep femoral vein (HCC)    Hypertension    Thrombocytopenia ([place])     Past Surgical History:  Procedure Laterality Date   BALLOON ENTEROSCOPY N/A 11/26/2020   Procedure: BALLOON ENTEROSCOPY;  Surgeon: Lavena Bullion, DO;  [facility]: [facility];  Service: Gastroenterology;  Laterality: N/A;  Midday please   BIOPSY  10/21/2020   Procedure: BIOPSY;  Surgeon: Doran Stabler, MD;  Location: [place];  Service: Gastroenterology;;   BIOPSY  11/21/2020   Procedure: BIOPSY;  Surgeon: Gatha Mayer, MD;  Location: [place];  Service: Endoscopy;;   COLONOSCOPY WITH PROPOFOL N/A 11/22/2020   Procedure: COLONOSCOPY WITH PROPOFOL;  Surgeon: Gatha Mayer, MD;  Location: [place];  Service: Endoscopy;  Laterality: N/A;   CYSTOSCOPY W/ URETERAL STENT PLACEMENT N/A 10/24/2020   Procedure: CYSTOSCOPY WITH RETROGRADE PYELOGRAM/URETERAL STENT PLACEMENT;  Surgeon: Ardis Hughs, MD;  Location: [place];  Service: Urology;  Laterality: N/A;   CYSTOSCOPY WITH URETHRAL DILATATION  10/24/2020   Procedure: URETHRAL DILATATION;  Surgeon: Ardis Hughs, MD;  Location: [place];  Service: Urology;;   ENTEROSCOPY N/A 11/21/2020   Procedure: ENTEROSCOPY;  Surgeon: Gatha Mayer, MD;  Location: [facility];  Service: Endoscopy;  Laterality: N/A;   ESOPHAGOGASTRODUODENOSCOPY (EGD) WITH PROPOFOL N/A 10/21/2020   Procedure: ESOPHAGOGASTRODUODENOSCOPY (EGD) WITH PROPOFOL;  Surgeon: Doran Stabler, MD;  Location: [place];  Service: Gastroenterology;  Laterality: N/A;   GIVENS CAPSULE STUDY N/A 11/22/2020   Procedure: GIVENS CAPSULE STUDY;  Surgeon: Gatha Mayer, MD;  Location: [place];  Service: Endoscopy;  Laterality: N/A;   HOT HEMOSTASIS N/A 11/26/2020   Procedure: HOT HEMOSTASIS (ARGON PLASMA COAGULATION/BICAP);  Surgeon: Lavena Bullion, DO;  Location: [facility] ENDOSCOPY;  Service: Gastroenterology;  Laterality: N/A;   MITRAL VALVE REPLACEMENT  07/2016    [facility] for endocarditis   PARTIAL COLECTOMY N/A 10/24/2020   Procedure: OPEN PARTIAL COLECTOMY WTH COLOSTOMY;  Surgeon: Rolm Bookbinder, MD;  Location: [place];  Service: General;  Laterality: N/A;   SIGMOIDOSCOPY  10/21/2020   Procedure: SIGMOIDOSCOPY;  Surgeon: Doran Stabler, MD;  Location: [place];  Service: Gastroenterology;;   SUBMUCOSAL INJECTION  10/21/2020   Procedure: SUBMUCOSAL INJECTION;  Surgeon: Doran Stabler, MD;  Location: [place];  Service: Gastroenterology;;  SPOT   SUBMUCOSAL TATTOO INJECTION  11/26/2020   Procedure: SUBMUCOSAL TATTOO INJECTION;  Surgeon: Lavena Bullion, DO;  Location: [facility];  Service: Gastroenterology;;       Inpatient Medications: Scheduled Meds:  ferrous sulfate  325 mg Oral Daily   nicotine  14 mg Transdermal Daily   pantoprazole  40 mg Oral Daily   umeclidinium bromide  1 puff Inhalation Daily   Continuous Infusions:  PRN Meds: acetaminophen **OR** acetaminophen, albuterol, metoprolol tartrate  Allergies:    Allergies  Allergen Reactions   Orange Juice [Orange Oil] Hives    Social History:   Social History   Socioeconomic History   Marital status: Single    Spouse name: Not on file   Number of children: Not on file   Years of education: Not on file   Highest education level: Not on file  Occupational History   Not on file  Tobacco Use   Smoking status: Every Day    Years: 62.00    Types: Cigarettes, Pipe   Smokeless tobacco: Never  Vaping Use   Vaping Use: Never used  Substance and Sexual Activity   Alcohol use: Not Currently    Comment: occasionally drinks beer 09/03/20   Drug use: Not Currently    Comment: previous marijuana use   Sexual activity: Not Currently  Other Topics Concern   Not on file  Social History Narrative   moved from [place] to [place] in 2019 to live with his son    Social Determinants of Radio broadcast assistant Strain: Not on file  Food Insecurity:  Not on file  Transportation Needs: Not on file  Physical Activity: Not on file  Stress: Not on file  Social Connections: Not on file  Intimate Partner Violence: Not on file    Family History:    Family History  Problem Relation Age of Onset   Hypertension Other      ROS:  Please see the history of present illness.   All other ROS reviewed and negative.     Physical Exam/Data:   Vitals:   06/08/21 0004 06/08/21 0529 06/08/21 0814 06/08/21 0835  BP: 90/62 98/65 (!) 92/51   Pulse:  (!) 101 (!) 103   Resp: 18 18 (!) 21   Temp: 99.1 F (37.3 C) 99.1 F (37.3 C) 97.9 F (36.6 C)  TempSrc: Oral Oral Oral   SpO2:  100% 98% 98%  Weight:      Height:        Intake/Output Summary (Last 24 hours) at 06/08/2021 1602 Last data filed at 06/08/2021 0100 Gross per 24 hour  Intake 240 ml  Output 375 ml  Net -135 ml   Last 3 Weights 06/04/2021 05/08/2021 04/14/2021  Weight (lbs) 151 lb 152 lb 150 lb 3.2 oz  Weight (kg) 68.493 kg 68.947 kg 68.13 kg     Body mass index is 20.48 kg/m.   Orthostatic vital signs: Laying: BP 96/69, pulse 104.  Sitting: 109/74 pulse 117.  Standing: Blood pressure 77/65 pulse 123. General:  Thin, frail 76 yo  in nAD  HEENT: normal Neck: no JVD Vascular: No carotid bruits; Distal pulses 2+ bilaterally Cardiac:  RRR   tachy S1, S2  No signif murmurs   Lungs: Rhonchi bilaterally  Abd: soft, nontender, no hepatomegaly  Ext: no LE  edema  Feet warm   Musculoskeletal:  No deformities, Skin: warm and dry Bruises  Neuro:  CNs 2-12 intact, no focal abnormalities noted   EKG:  The EKG was personally reviewed and demonstrates:  06/04/21   Atrial flutter with variable conduction   rSR' V1. Telemetry:  Telemetry was personally reviewed and demonstrates:  Afib/flutter  Currenty 120s    Relevant CV Studies: Echo 09/2020 1. Left ventricular ejection fraction, by estimation, is 65 to 70%. The left ventricle has normal function. The left ventricle has no regional  wall motion abnormalities. Left ventricular diastolic function could not be evaluated. 2. Right ventricular systolic function is normal. The right ventricular size is mildly enlarged. There is normal pulmonary artery systolic pressure. The estimated right ventricular systolic pressure is 80.1 mmHg. 3. 31 mm mosaic prosthetic valve with MG 7 mmHG @ 130 bpm. EOA 2.32 cm2. DI 1.6. Normal prosthesis with higher gradients due to tachycardia. The mitral valve has been repaired/replaced. No evidence of mitral valve regurgitation. There is a 31 mm Medtronic Mosaic bioprosthetic valve present in the mitral position. Procedure Date: 07/14/2016. 4. The aortic valve is tricuspid. Aortic valve regurgitation is not visualized. No aortic stenosis is present. 5. The inferior vena cava is normal in size with greater than 50% respiratory variability, suggesting right atrial pressure of 3 mmHg.  Laboratory Data:  High Sensitivity Troponin:   Recent Labs  Lab 06/04/21 2137 06/05/21 0130  TROPONINIHS 4 4     Chemistry Recent Labs  Lab 06/04/21 2137 06/05/21 0620 06/07/21 0553 06/08/21 0240  NA 138 136 135 138  K 4.2 5.0 4.3 4.7  CL 106 108 104 105  CO2 27 24 26 27   GLUCOSE 87 82 82 102*  BUN 27* 23 29* 32*  CREATININE 1.25* 1.10 1.20 1.27*  CALCIUM 8.6* 8.4* 9.2 9.0  MG 2.3  --   --   --   GFRNONAA 60* >60 >60 59*  ANIONGAP 5 4* 5 6    Recent Labs  Lab 06/04/21 2137  PROT 6.4*  ALBUMIN 3.2*  AST 21  ALT 23  ALKPHOS 69  BILITOT 0.8   Lipids No results for input(s): CHOL, TRIG, HDL, LABVLDL, LDLCALC, CHOLHDL in the last 168 hours.  Hematology Recent Labs  Lab 06/06/21 0500 06/07/21 0553 06/08/21 0240  WBC 2.7*   2.7* 2.5* 3.9*  RBC 3.70*   3.69* 3.90* 3.92*  HGB 10.1*   10.1* 10.8* 10.8*  HCT 31.5*   30.8* 32.2* 33.0*  MCV 85.1  83.5 82.6 84.2  MCH 27.3   27.4 27.7 27.6  MCHC 32.1   32.8 33.5 32.7  RDW 16.3*   16.0* 16.0* 16.3*  PLT 11*   13* 12* 12*   Thyroid  Recent  Labs  Lab 06/04/21 2137  TSH 1.437    BNPNo results for input(s): BNP, PROBNP in the last 168 hours.  DDimer No results for input(s): DDIMER in the last 168 hours.   Radiology/Studies:  MR BRAIN WO CONTRAST  Result Date: 06/05/2021 CLINICAL DATA:  76 year old male with neurologic deficit. EXAM: MRI HEAD WITHOUT CONTRAST TECHNIQUE: Multiplanar, multiecho pulse sequences of the brain and surrounding structures were obtained without intravenous contrast. COMPARISON:  None. FINDINGS: Brain: Cerebral volume is within normal limits for age. No restricted diffusion to suggest acute infarction. No midline shift, mass effect, evidence of mass lesion, ventriculomegaly, extra-axial collection or acute intracranial hemorrhage. Cervicomedullary junction and pituitary are within normal limits. Scattered bilateral cerebral white matter T2 and FLAIR hyperintensity is moderately advanced, Patchy and confluent in the periatrial white matter. And there are scattered chronic microhemorrhages in the brain, although less than typical of amyloid angiopathy. Still, the chronic blood products are fairly numerous in the posterior circulation including the cerebellum. No cortical encephalomalacia identified. Deep gray matter nuclei and brainstem are within normal limits. Vascular: Major intracranial vascular flow voids are preserved. Skull and upper cervical spine: Negative visible cervical spine. Normal visible bone marrow signal. Sinuses/Orbits: Negative orbits. Moderate bilateral maxillary sinus mucosal thickening. Some bubbly opacity but no sinus fluid levels. Other: Mastoids are clear. Visible internal auditory structures appear normal. Small volume retained secretions in the nasopharynx. Negative visible scalp and face. IMPRESSION: 1. No acute intracranial abnormality. 2. Moderately advanced signal changes in the cerebral white matter with fairly widely scattered chronic micro-hemorrhages, although NOT rising to a level  typical of amyloid angiopathy. Electronically Signed   By: Genevie Ann M.D.   On: 06/05/2021 06:39   DG Chest Portable 1 View  Result Date: 06/04/2021 CLINICAL DATA:  Dizziness, near syncope EXAM: PORTABLE CHEST 1 VIEW COMPARISON:  11/20/2020 FINDINGS: Single frontal view of the chest demonstrates postsurgical changes from CABG. Cardiac silhouette is stable. Mild central vascular congestion without airspace disease, effusion, or pneumothorax. No acute bony abnormalities. IMPRESSION: 1. Mild central vascular congestion without overt edema. Electronically Signed   By: Randa Ngo M.D.   On: 06/04/2021 21:35     Assessment and Plan:   Hypotension   Pt's BP marginal supine, drops with standing     He remains tachycardic with atrial flutter    UA on admit concentrated with SG of 1.02  His urine today appears dark  Labs today show increase in BUN / Cr. I would recomm trial of IV hydration Reassess tomorrow  2  Atrial flutter     Rates uncontrolled   Pt not on anticoagulation with severe thrombocytopenia      BP limits rate control Keep on metoprolol 25 bid Give 0.25 mg digoxin now and then in 4 hours   Then 0.125 in am   I would try this before trying other agents    Keep on anticoag with thrombocytopenai  3  s/p MVR    Echo shows valve function good  4  Pancytopenia    BM bx tomorrow       For questions or updates, please contact [place] Please consult [email or web address] for contact info under    Signed, Dorris Carnes, MD  06/08/2021 4:02 PM

## 2021-06-08 NOTE — Care Management (Signed)
°  Transition of Care [facility]) Screening Note   Patient Details  Name: [name] Date of Birth: Mar 24, 1946   Transition of Care [facility]) CM/SW Contact:    Bethena Roys, RN Phone Number: 06/08/2021, 9:58 AM    Transition of Care Department [facility]) has reviewed patient and no TOC needs have been identified at this time. We will continue to monitor patient advancement through interdisciplinary progression rounds. If new patient transition needs arise, please place a TOC consult.

## 2021-06-08 NOTE — Progress Notes (Signed)
Family Medicine Teaching Service Daily Progress Note Intern Pager: [phone]  Patient name: [name] Medical record number: [id number] Date of birth: [birth year] Age: 76 y.o. Gender: male  Primary Care Provider: Zenia Resides, MD Consultants: Heme/onc Code Status: Full  Pt Overview and Major Events to Date:  06/04/2021-admitted  Assessment and Plan: [name] is a 76 y.o. 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   Presyncope Patient denies any syncope or presyncopal events over the last 24 hours.  He reports that his blood pressures usually run low and his pulse is usually high.  Blood pressures over the last 24 hours have ranged from 90-107/51-72.  Currently has metoprolol 25 mg as needed for heart rate greater than 110.  Parameters blood pressure parameters are in place. - Continue to monitor blood pressure - Continue to monitor heart rate - Monitor for signs and symptoms of presyncope/syncope  Pancytopenia   colon cancer - adenocarcinoma Patient follows with hematology/oncology outpatient.  They have been consulted for this hospitalization.  Feel that it may be related to sonidegib and so this medication is being held.  Interventional radiology has been consulted for bone marrow biopsy scheduled for early this week.  Platelets this morning stable at 12. - Continue to monitor platelet level with daily CBCs - Continue to hold sonidegib - Hematology/oncology consulted, appreciate their assistance - Transfuse 1 unit of platelets if platelet count is less than 10,000 - Monitor for signs and symptoms of infection and if these occur start   A flutter EKG on arrival showing 1-4 AV block.  Continues to have episodes of hypotension.  Receives metoprolol as needed with blood pressure and heart rate parameters.  FEN/GI:  Regular diet PPx: SCDs only due to severe thrombocytopenia Dispo: Home pending clinical improvement.  Barriers include bone marrow biopsy early next week.  Subjective:  Patient reports that he is doing well this morning and has not no episodes of syncope.  He would like to be home but understands he needs this bone marrow biopsy.  Wants to know what time it has been scheduled for on Monday.  Has no other complaints at this time.  Objective: Temp:  [97.8 F (36.6 C)-99.6 F (37.6 C)] 99.1 F (37.3 C) (01/08 0529) Pulse Rate:  [101-122] 101 (01/08 0529) Resp:  [16-20] 18 (01/08 0529) BP: (90-107)/(62-72) 98/65 (01/08 0529) SpO2:  [95 %-100 %] 100 % (01/08 0529) Physical Exam: General: Pleasant, resting comfortably in bed, no acute distress Cardiac: Mildly tachycardic in the low 100s, no murmurs appreciated Respiratory: Normal work of breathing, speaking in full sentences Abdomen: Soft, nontender MSK: No gross abnormalities  Laboratory: Recent Labs  Lab 06/06/21 0500 06/07/21 0553 06/08/21 0240  WBC 2.7*   2.7* 2.5* 3.9*  HGB 10.1*   10.1* 10.8* 10.8*  HCT 31.5*   30.8* 32.2* 33.0*  PLT 11*   13* 12* 12*    Recent Labs  Lab 06/04/21 2137 06/05/21 0620 06/07/21 0553 06/08/21 0240  NA 138 136 135 138  K 4.2 5.0 4.3 4.7  CL 106 108 104 105  CO2 '27 24 26 27  ' BUN 27* 23 29* 32*  CREATININE 1.25* 1.10 1.20 1.27*  CALCIUM 8.6* 8.4* 9.2 9.0  PROT 6.4*  --   --   --   BILITOT 0.8  --   --   --   [name]  69  --   --   --   ALT 23  --   --   --   AST 21  --   --   --   GLUCOSE 87 82 82 102*       [name], MD 06/08/2021, 8:00 AM PGY-3, [place] [facility] Intern pager: [phone], text pages welcome

## 2021-06-09 DIAGNOSIS — I484 Atypical atrial flutter: Secondary | ICD-10-CM | POA: Diagnosis not present

## 2021-06-09 DIAGNOSIS — D696 Thrombocytopenia, unspecified: Secondary | ICD-10-CM | POA: Diagnosis not present

## 2021-06-09 DIAGNOSIS — R55 Syncope and collapse: Secondary | ICD-10-CM | POA: Diagnosis not present

## 2021-06-09 DIAGNOSIS — I959 Hypotension, unspecified: Secondary | ICD-10-CM | POA: Diagnosis not present

## 2021-06-09 LAB — CBC
HCT: 30.9 % — ABNORMAL LOW (ref 39.0–52.0)
Hemoglobin: 10 g/dL — ABNORMAL LOW (ref 13.0–17.0)
MCH: 27.2 pg (ref 26.0–34.0)
MCHC: 32.4 g/dL (ref 30.0–36.0)
MCV: 84 fL (ref 80.0–100.0)
Platelets: 11 10*3/uL — CL (ref 150–400)
RBC: 3.68 MIL/uL — ABNORMAL LOW (ref 4.22–5.81)
RDW: 16.3 % — ABNORMAL HIGH (ref 11.5–15.5)
WBC: 3.2 10*3/uL — ABNORMAL LOW (ref 4.0–10.5)
nRBC: 0 % (ref 0.0–0.2)

## 2021-06-09 LAB — BASIC METABOLIC PANEL
Anion gap: 6 (ref 5–15)
BUN: 30 mg/dL — ABNORMAL HIGH (ref 8–23)
CO2: 26 mmol/L (ref 22–32)
Calcium: 8.8 mg/dL — ABNORMAL LOW (ref 8.9–10.3)
Chloride: 107 mmol/L (ref 98–111)
Creatinine, Ser: 1.13 mg/dL (ref 0.61–1.24)
GFR, Estimated: >60 mL/min (ref >60)
Glucose, Bld: 104 mg/dL — ABNORMAL HIGH (ref 70–99)
Potassium: 4.4 mmol/L (ref 3.5–5.1)
Sodium: 139 mmol/L (ref 135–145)

## 2021-06-09 MED ORDER — ROSUVASTATIN CALCIUM 20 MG PO TABS
20.0000 mg | ORAL_TABLET | Freq: Every day | ORAL | Status: DC
  Administered 2021-06-09: 20 mg via ORAL
  Filled 2021-06-09: qty 1

## 2021-06-09 NOTE — Progress Notes (Signed)
Went to round on patient at the start of night shift.  Patient had no concerns or complaints.  BP 114/66 (BP Location: Left Arm)    Pulse (!) 115    Temp 98.6 F (37 C) (Oral)    Resp (!) 21    Ht 6' (1.829 m)    Wt 68.5 kg    SpO2 96%    BMI 20.48 kg/m   General: Elderly male lying in bed in no distress Cardiac: Tachycardic rate, S1, S2 present Respiratory: Clear to auscultation bilaterally  A/P No concerns or complaints tonight.  Plan for bone marrow biopsy tomorrow.  Remainder per daytime progress note

## 2021-06-09 NOTE — Progress Notes (Signed)
PT Cancellation Note  Patient Details Name: [name] MRN: [id number] DOB: May 09, 1946   Cancelled Treatment:    Reason Eval/Treat Not Completed: Medical issues which prohibited therapy.  Despite platelet infusion being orderd, has level of 11.  Hold PT for today and ck values moving forward.   Ramond Dial 06/09/2021, 11:36 AM  Mee Hives, PT PhD Acute Rehab Dept. Number: [place]

## 2021-06-09 NOTE — Consult Note (Signed)
Chief Complaint: Patient was seen in consultation today for Bone marrow biopsy Chief Complaint  Patient presents with   Near Syncope   Irregular Heart Beat   at the request of Dr Benay Spice   Supervising Physician: Sandi Mariscal  Patient Status: [facility] - In-pt  History of Present Illness: [name] is a 76 y.o. male   Pt of Dr Benay Spice Chronic Thrombocytopenia Hx Colon Cancer; Mitral valve replacement Admitted with Pre syncopal episode 06/04/21  Pancytopenia Was scheduled for Bone marrow bx as Op--- but cancelled for family emergency Now IP for syncope work up and pancytopenia  Scheduled for bone marrow bx in am  Dr Benay Spice note 1/6: [name] has developed progressive pancytopenia over the past several months.  The etiology of the pancytopenia is unclear.  He has a history of chronic thrombocytopenia, but this has progressed significantly over recent months.  He now has progressive anemia and leukopenia.  The peripheral blood smear is concerning for a bone marrow process.  The differential diagnosis includes a lymphoproliferative disorder and myelodysplasia.  Past Medical History:  Diagnosis Date   Anemia    Atrial fibrillation ([place])    post operative in 2018   Atrial flutter ([place])    Bacteremia 2018   Basal cell carcinoma    COPD (chronic obstructive pulmonary disease) (HCC)    Dementia (HCC)    Diabetes (HCC)    DVT of deep femoral vein (HCC)    Hypertension    Thrombocytopenia ([place])     Past Surgical History:  Procedure Laterality Date   BALLOON ENTEROSCOPY N/A 11/26/2020   Procedure: BALLOON ENTEROSCOPY;  Surgeon: Lavena Bullion, DO;  Location: [facility];  Service: Gastroenterology;  Laterality: N/A;  Midday please   BIOPSY  10/21/2020   Procedure: BIOPSY;  Surgeon: Doran Stabler, MD;  Location: [place];  Service: Gastroenterology;;   BIOPSY  11/21/2020   Procedure: BIOPSY;  Surgeon: Gatha Mayer, MD;  Location: [place];  Service:  Endoscopy;;   COLONOSCOPY WITH PROPOFOL N/A 11/22/2020   Procedure: COLONOSCOPY WITH PROPOFOL;  Surgeon: Gatha Mayer, MD;  Location: [place];  Service: Endoscopy;  Laterality: N/A;   CYSTOSCOPY W/ URETERAL STENT PLACEMENT N/A 10/24/2020   Procedure: CYSTOSCOPY WITH RETROGRADE PYELOGRAM/URETERAL STENT PLACEMENT;  Surgeon: Ardis Hughs, MD;  Location: [place];  Service: Urology;  Laterality: N/A;   CYSTOSCOPY WITH URETHRAL DILATATION  10/24/2020   Procedure: URETHRAL DILATATION;  Surgeon: Ardis Hughs, MD;  Location: [place];  Service: Urology;;   ENTEROSCOPY N/A 11/21/2020   Procedure: ENTEROSCOPY;  Surgeon: Gatha Mayer, MD;  Location: [place];  Service: Endoscopy;  Laterality: N/A;   ESOPHAGOGASTRODUODENOSCOPY (EGD) WITH PROPOFOL N/A 10/21/2020   Procedure: ESOPHAGOGASTRODUODENOSCOPY (EGD) WITH PROPOFOL;  Surgeon: Doran Stabler, MD;  Location: [place];  Service: Gastroenterology;  Laterality: N/A;   GIVENS CAPSULE STUDY N/A 11/22/2020   Procedure: GIVENS CAPSULE STUDY;  Surgeon: Gatha Mayer, MD;  Location: [place];  Service: Endoscopy;  Laterality: N/A;   HOT HEMOSTASIS N/A 11/26/2020   Procedure: HOT HEMOSTASIS (ARGON PLASMA COAGULATION/BICAP);  Surgeon: Lavena Bullion, DO;  Location: [facility] ENDOSCOPY;  Service: Gastroenterology;  Laterality: N/A;   MITRAL VALVE REPLACEMENT  07/2016   [facility] for endocarditis   PARTIAL COLECTOMY N/A 10/24/2020   Procedure: OPEN PARTIAL COLECTOMY WTH COLOSTOMY;  Surgeon: Rolm Bookbinder, MD;  Location: [place];  Service: General;  Laterality: N/A;   SIGMOIDOSCOPY  10/21/2020   Procedure: SIGMOIDOSCOPY;  Surgeon: Doran Stabler, MD;  Location: [place];  Service: Gastroenterology;;   [place] INJECTION  10/21/2020   Procedure: SUBMUCOSAL INJECTION;  Surgeon: Doran Stabler, MD;  Location: [facility] END[facility]COPY;  Service: Gastroenterology;;  SPOT   SUBMUCOSAL TATTOO INJECTION  11/26/2020   Procedure: SUBMUCOSAL  TATTOO INJECTION;  Surgeon: Lavena Bullion, DO;  Location: [facility];  Service: Gastroenterology;;    Allergies: Orange juice Haig Prophet oil]  Medications: Prior to Admission medications   Medication Sig Start Date End Date Taking? Authorizing Provider  acetaminophen (TYLENOL) 325 MG tablet Take 2 tablets (650 mg total) by mouth every 6 (six) hours as needed for mild pain or fever. 11/01/20  Yes Norm Parcel, PA-C  albuterol (VENTOLIN HFA) 108 (90 Base) MCG/ACT inhaler Inhale 1-2 puffs into the lungs every 6 (six) hours as needed for wheezing or shortness of breath. 11/01/20  Yes Hongalgi, Lenis Dickinson, MD  aspirin EC 325 MG tablet Take 325 mg by mouth every 4 (four) hours as needed for moderate pain.   Yes [provider]  capecitabine (XELODA) 500 MG tablet Take 2 tabs(1000 mg) in the am. And 2 tabs(1000 mg) in the evening  twice a day for 14 days on and 7 days off. Patient taking differently: Take 500 mg by mouth daily. 01/27/21  Yes Ladell Pier, MD  diltiazem (CARDIZEM CD) 120 MG 24 hr capsule TAKE 1 CAPSULE(120 MG) BY MOUTH AT BEDTIME Patient taking differently: Take 120 mg by mouth daily. 03/04/21  Yes Hensel, Jamal Collin, MD  ferrous sulfate 325 (65 FE) MG tablet Take 1 tablet (325 mg total) by mouth daily. 01/29/21 06/04/21 Yes Hensel, Jamal Collin, MD  metoprolol tartrate (LOPRESSOR) 25 MG tablet Take 1 tablet (25 mg total) by mouth 2 (two) times daily. 01/29/21  Yes Hensel, Jamal Collin, MD  pantoprazole (PROTONIX) 40 MG tablet Take 1 tablet (40 mg total) by mouth daily at 6 (six) AM. Patient taking differently: Take 40 mg by mouth daily. 01/29/21  Yes Hensel, Jamal Collin, MD  SPIRIVA HANDIHALER 18 MCG inhalation capsule PLACE ONE CAPSULE INTO THE INHALER AND INHALE DAILY Patient taking differently: Place 18 mcg into inhaler and inhale daily. 03/04/21  Yes Hensel, Jamal Collin, MD  apixaban (ELIQUIS) 5 MG TABS tablet Take 1 tablet (5 mg total) by mouth 2 (two) times daily. Patient not  taking: Reported on 12/06/2020 11/01/20   Modena Jansky, MD     Family History  Problem Relation Age of Onset   Hypertension Other     Social History   Socioeconomic History   Marital status: Single    Spouse name: Not on file   Number of children: Not on file   Years of education: Not on file   Highest education level: Not on file  Occupational History   Not on file  Tobacco Use   Smoking status: Every Day    Years: 62.00    Types: Cigarettes, Pipe   Smokeless tobacco: Never  Vaping Use   Vaping Use: Never used  Substance and Sexual Activity   Alcohol use: Not Currently    Comment: occasionally drinks beer 09/03/20   Drug use: Not Currently    Comment: previous marijuana use   Sexual activity: Not Currently  Other Topics Concern   Not on file  Social History Narrative   moved from [place] to [place] in 2019 to live with his son    Social Determinants of Radio broadcast assistant Strain:  Not on file  Food Insecurity: Not on file  Transportation Needs: Not on file  Physical Activity: Not on file  Stress: Not on file  Social Connections: Not on file     Review of Systems: A 12 point ROS discussed and pertinent positives are indicated in the HPI above.  All other systems are negative.  Review of Systems  Constitutional:  Positive for activity change and fatigue.  Respiratory:  Negative for cough.   Gastrointestinal:  Negative for abdominal pain.  Neurological:  Positive for weakness.  Psychiatric/Behavioral:  Negative for behavioral problems and confusion.    Vital Signs: BP 103/62 (BP Location: Left Arm)    Pulse 69    Temp 97.7 F (36.5 C) (Oral)    Resp 20    Ht 6' (1.829 m)    Wt 151 lb (68.5 kg)    SpO2 97%    BMI 20.48 kg/m   Physical Exam Vitals reviewed.  HENT:     Mouth/Throat:     Mouth: Mucous membranes are moist.  Cardiovascular:     Rate and Rhythm: Normal rate and regular rhythm.  Pulmonary:     Breath sounds: Normal breath  sounds.  Abdominal:     Palpations: Abdomen is soft.  Skin:    General: Skin is warm.  Neurological:     Mental Status: He is alert and oriented to person, place, and time.  Psychiatric:        Behavior: Behavior normal.    Imaging: MR BRAIN WO CONTRAST  Result Date: 06/05/2021 CLINICAL DATA:  76 year old male with neurologic deficit. EXAM: MRI HEAD WITHOUT CONTRAST TECHNIQUE: Multiplanar, multiecho pulse sequences of the brain and surrounding structures were obtained without intravenous contrast. COMPARISON:  None. FINDINGS: Brain: Cerebral volume is within normal limits for age. No restricted diffusion to suggest acute infarction. No midline shift, mass effect, evidence of mass lesion, ventriculomegaly, extra-axial collection or acute intracranial hemorrhage. Cervicomedullary junction and pituitary are within normal limits. Scattered bilateral cerebral white matter T2 and FLAIR hyperintensity is moderately advanced, Patchy and confluent in the periatrial white matter. And there are scattered chronic microhemorrhages in the brain, although less than typical of amyloid angiopathy. Still, the chronic blood products are fairly numerous in the posterior circulation including the cerebellum. No cortical encephalomalacia identified. Deep gray matter nuclei and brainstem are within normal limits. Vascular: Major intracranial vascular flow voids are preserved. Skull and upper cervical spine: Negative visible cervical spine. Normal visible bone marrow signal. Sinuses/Orbits: Negative orbits. Moderate bilateral maxillary sinus mucosal thickening. Some bubbly opacity but no sinus fluid levels. Other: Mastoids are clear. Visible internal auditory structures appear normal. Small volume retained secretions in the nasopharynx. Negative visible scalp and face. IMPRESSION: 1. No acute intracranial abnormality. 2. Moderately advanced signal changes in the cerebral white matter with fairly widely scattered chronic  micro-hemorrhages, although NOT rising to a level typical of amyloid angiopathy. Electronically Signed   By: Genevie Ann M.D.   On: 06/05/2021 06:39   DG Chest Portable 1 View  Result Date: 06/04/2021 CLINICAL DATA:  Dizziness, near syncope EXAM: PORTABLE CHEST 1 VIEW COMPARISON:  11/20/2020 FINDINGS: Single frontal view of the chest demonstrates postsurgical changes from CABG. Cardiac silhouette is stable. Mild central vascular congestion without airspace disease, effusion, or pneumothorax. No acute bony abnormalities. IMPRESSION: 1. Mild central vascular congestion without overt edema. Electronically Signed   By: Randa Ngo M.D.   On: 06/04/2021 21:35    Labs:  CBC: Recent Labs  06/06/21 0500 06/07/21 0553 06/08/21 0240 06/09/21 0528  WBC 2.7*   2.7* 2.5* 3.9* 3.2*  HGB 10.1*   10.1* 10.8* 10.8* 10.0*  HCT 31.5*   30.8* 32.2* 33.0* 30.9*  PLT 11*   13* 12* 12* 11*    COAGS: Recent Labs    11/20/20 1652 06/04/21 2137  INR 1.8* 1.2    BMP: Recent Labs    06/05/21 0620 06/07/21 0553 06/08/21 0240 06/09/21 0528  NA 136 135 138 139  K 5.0 4.3 4.7 4.4  CL 108 104 105 107  CO2 '24 26 27 26  ' GLUCOSE 82 82 102* 104*  BUN 23 29* 32* 30*  CALCIUM 8.4* 9.2 9.0 8.8*  CREATININE 1.10 1.20 1.27* 1.13  GFRNONAA >60 >60 59* >60    LIVER FUNCTION TESTS: Recent Labs    02/13/21 1312 03/04/21 1136 03/31/21 1303 06/04/21 2137  BILITOT 0.6 0.7 1.0 0.8  AST 13* 13* 17 21  ALT '9 11 15 23  ' ALKPHOS 58 63 68 69  PROT 7.2 7.5 7.8 6.4*  ALBUMIN 3.6 3.9 3.9 3.2*    TUMOR MARKERS: No results for input(s): AFPTM, CEA, CA199, CHROMGRNA in the last 8760 hours.  Assessment and Plan:  Hx Colon cancer Chronic thrombocytopenia Onset pancytopenia Scheduled for Bone marrow biopsy per Oncology Risks and benefits of Bone marrow biopsy was discussed with the patient and/or patient's family including, but not limited to bleeding, infection, damage to adjacent structures or low yield  requiring additional tests.  All of the questions were answered and there is agreement to proceed.  Consent signed and in chart.   Thank you for this interesting consult.  I greatly enjoyed meeting WILLIES LAVIOLETTE and look forward to participating in their care.  A copy of this report was sent to the requesting provider on this date.  Electronically Signed: Lavonia Drafts, PA-C 06/09/2021, 1:35 PM   I spent a total of 20 Minutes    in face to face in clinical consultation, greater than 50% of which was counseling/coordinating care for Bone marrow biopsy

## 2021-06-09 NOTE — Progress Notes (Addendum)
IP PROGRESS NOTE  Subjective:   No complaints today.  Denies bleeding.  Objective: Vital signs in last 24 hours: Blood pressure 103/62, pulse 69, temperature 97.7 F (36.5 C), temperature source Oral, resp. rate 20, height 6' (1.829 m), weight 68.5 kg, SpO2 97 %.  Intake/Output from previous day: 01/08 0701 - 01/09 0700 In: 435.9 [I.V.:435.9] Out: 350 [Urine:350]  Physical Exam:  HEENT: No thrush or bleeding Lungs: Bilateral end inspiratory rhonchi, no respiratory distress Cardiac: Regular rhythm Abdomen: No hepatosplenomegaly, left abdomen colostomy with brown stool Extremities: No leg edema Skin: Scattered ecchymoses over the extremities, petechiae of the lower legs.  Scabbing and areas of previous large basal cell carcinomas at the left forearm and wrist   Lab Results: Recent Labs    06/08/21 0240 06/09/21 0528  WBC 3.9* 3.2*  HGB 10.8* 10.0*  HCT 33.0* 30.9*  PLT 12* 11*  Blood smear 06/05/2021-markedly decreased platelets, decreased white cells with the majority being mature neutrophils and lymphocytes, no blasts.  Numerous ovalocytes, acanthocytes, and a few "cigar "cells.  No nucleated red cells.  BMET Recent Labs    06/08/21 0240 06/09/21 0528  NA 138 139  K 4.7 4.4  CL 105 107  CO2 27 26  GLUCOSE 102* 104*  BUN 32* 30*  CREATININE 1.27* 1.13  CALCIUM 9.0 8.8*    Lab Results  Component Value Date   CEA1 3.9 10/22/2020    Studies/Results: No results found.  Medications: I have reviewed the patient's current medications.  Assessment/Plan: Sigmoid colon adenocarcinoma -Colonoscopy 10/21/2020- partially obstructing mass found in the sigmoid colon consistent with adenocarcinoma. -CEA on 10/22/2020 was 3.9 -CTs 10/22/2020-colonic mass with pelvic lymphadenopathy including right common iliac and external iliac nodes, small pulmonary nodules -10/24/2020-sigmoid colectomy, end colostomy, tumor stuck to pelvic sidewall with rind of tissue remaining at  completion of surgery -Pathology- moderately differentiated adenocarcinoma the sigmoid colon, tumor extends into pericolonic connective tissue, no lymphovascular perineural invasion, 0/14 lymph nodes, carcinoma involves an area with the specimen was disrupted at mesenteric margin; mismatch repair protein (IHC) normal; MSI stable -CTs 11/24/2020-interval resection of colonic mass, decreased size of previously noted right pelvic adenopathy, new noted at the right internal/external iliac bifurcation, unchanged pulmonary nodules -Guardant reveal 12/06/2020-ctDNA not detected -Cycle 1 adjuvant Xeloda anticipated 01/06/2021 -Cycle 2 adjuvant Xeloda 01/27/2021, Xeloda dose reduced secondary to thrombocytopenia (he took 1 pill a day for an unclear amount of time, less than 14 days) Cycle 3 adjuvant Xeloda 02/17/2021 2.  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 3.  Thrombocytopenia-chronic 4.  Atrial flutter 5.  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  COPD 7.  Hypertension 8.  Diabetes mellitus 9.  Dementia 10.  Tobacco dependence 11. Left wrist basal cell carcinoma -Skin biopsy 08/16/2020 - Basal cell carcinoma, nodular and infiltrative patterns, peripheral and deep margins involved -Seen by radiation oncology 09/03/2020 -Established care with the wound center on 09/13/2019 -Placed on Sonidegib by dermatology, marked clinical improvement 12.  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  Admission 06/05/2021 with presyncope   [name] has developed progressive pancytopenia over the past several  months.  The etiology of the pancytopenia is unclear.  He has a history  of chronic thrombocytopenia, but this has progressed significantly over recent months.  He now has progressive anemia and leukopenia.  The peripheral blood smear is concerning for a bone marrow process.  The differential diagnosis includes a lymphoproliferative disorder and myelodysplasia.  I doubt the hematologic findings are related to Sonidegib as the progressive thrombocytopenia predates the start of this agent.  However, his white blood cell count has slowly improved since stopping the sonidegib.  IR planning for diagnostic bone marrow biopsy on 06/10/2021.  He has experienced dramatic improvement in the left arm basal cell carcinomas while on Sonidegib  Recommendations: Hold  Sonidegib Transfuse 1 unit of a pheresis platelets for a count of less than 10,000 or bleeding Proceed with diagnostic bone marrow biopsy 06/10/2021 Management of atrial flutter per the medical service Daily CBC with differential  Shawn Bussing, NP   06/09/2021, 2:33 PM [name] was interviewed and examined.  He appears stable.  He has persistent severe thrombocytopenia of unclear etiology.  He is scheduled undergo a diagnostic bone marrow biopsy tomorrow.  Neutropenia has improved since hospital admission.  I recommend continuing holding the sonidegib.  He can be discharged after the bone marrow biopsy from a hematologic standpoint.  Outpatient follow-up will be scheduled at the Cancer center.  I was present for greater than 50% of today's visit.  I performed medical decision making.

## 2021-06-09 NOTE — Progress Notes (Signed)
Progress Note  Patient Name: [name] Date of Encounter: 06/09/2021  Primary Cardiologist: Donato Heinz, MD   Subjective   Patient seen examined his bedside.  He offers no complaints at this time.  He is aware of upcoming procedure his bone marrow biopsy but is not sure if this today is tomorrow.  Inpatient Medications    Scheduled Meds:  digoxin  0.125 mg Oral Daily   ferrous sulfate  325 mg Oral Daily   nicotine  14 mg Transdermal Daily   pantoprazole  40 mg Oral Daily   umeclidinium bromide  1 puff Inhalation Daily   Continuous Infusions:  sodium chloride 75 mL/hr at 06/09/21 0016   PRN Meds: acetaminophen **OR** acetaminophen, albuterol, metoprolol tartrate   Vital Signs    Vitals:   06/08/21 1942 06/09/21 0420 06/09/21 0747 06/09/21 0820  BP: 124/88 100/65 97/75   Pulse: (!) 101 (!) 102 (!) 120   Resp: _0 Temp: 97.9 F (36.6 C) (!) 97.5 F (36.4 C) 97.9 F (36.6 C)   TempSrc: Oral Oral Oral   SpO2: 98% 96%  97%  Weight:      Height:        Intake/Output Summary (Last 24 hours) at 06/09/2021 0910 Last data filed at 06/09/2021 0605 Gross per 24 hour  Intake 435.92 ml  Output 350 ml  Net 85.92 ml   Filed Weights   06/04/21 2054  Weight: 68.5 kg    Telemetry    Atrial flutter with control reticular rate- Personally Reviewed  ECG    None today- Personally Reviewed  Physical Exam   General: Comfortable, lying in bed Head: Atraumatic, normal size  Eyes: PEERLA, EOMI  Neck: Supple, normal JVD Cardiac: Normal S1, S2; RRR; no murmurs, rubs, or gallops Lungs: Clear to auscultation bilaterally Abd: Soft, nontender, no hepatomegaly  Ext: warm, no edema Musculoskeletal: No deformities, BUE and BLE strength normal and equal Skin: Warm and dry, no rashes   Neuro: Alert and oriented to person, place, time, and situation, CNII-XII grossly intact, no focal deficits  Psych: Normal mood and affect   Labs    Chemistry Recent Labs   Lab 06/04/21 2137 06/05/21 0620 06/07/21 0553 06/08/21 0240 06/09/21 0528  NA 138   < > 135 138 139  K 4.2   < > 4.3 4.7 4.4  CL 106   < > 104 105 107  CO2 27   < > _1 GLUCOSE 87   < > 82 102* 104*  BUN 27*   < > 29* 32* 30*  CREATININE 1.25*   < > 1.20 1.27* 1.13  CALCIUM 8.6*   < > 9.2 9.0 8.8*  PROT 6.4*  --   --   --   --   ALBUMIN 3.2*  --   --   --   --   AST 21  --   --   --   --   ALT 23  --   --   --   --   ALKPHOS 69  --   --   --   --   BILITOT 0.8  --   --   --   --   GFRNONAA 60*   < > >60 59* >60  ANIONGAP 5   < > _2 < > = values in this interval not displayed.     Hematology Recent Labs  Lab 06/07/21 [phone] 06/08/21  0240 06/09/21 0528  WBC 2.5* 3.9* 3.2*  RBC 3.90* 3.92* 3.68*  HGB 10.8* 10.8* 10.0*  HCT 32.2* 33.0* 30.9*  MCV 82.6 84.2 84.0  MCH 27.7 27.6 27.2  MCHC 33.5 32.7 32.4  RDW 16.0* 16.3* 16.3*  PLT 12* 12* 11*    Cardiac EnzymesNo results for input(s): TROPONINI in the last 168 hours. No results for input(s): TROPIPOC in the last 168 hours.   BNPNo results for input(s): BNP, PROBNP in the last 168 hours.   DDimer No results for input(s): DDIMER in the last 168 hours.   Radiology    No results found.  Cardiac Studies   Echo 09/2020 1. Left ventricular ejection fraction, by estimation, is 65 to 70%. The left ventricle has normal function. The left ventricle has no regional wall motion abnormalities. Left ventricular diastolic function could not be evaluated. 2. Right ventricular systolic function is normal. The right ventricular size is mildly enlarged. There is normal pulmonary artery systolic pressure. The estimated right ventricular systolic pressure is 01.7 mmHg. 3. 31 mm mosaic prosthetic valve with MG 7 mmHG @ 130 bpm. EOA 2.32 cm2. DI 1.6. Normal prosthesis with higher gradients due to tachycardia. The mitral valve has been repaired/replaced. No evidence of mitral valve regurgitation. There is a 31 mm  Medtronic Mosaic bioprosthetic valve present in the mitral position. Procedure Date: 07/14/2016. 4. The aortic valve is tricuspid. Aortic valve regurgitation is not visualized. No aortic stenosis is present. 5. The inferior vena cava is normal in size with greater than 50% respiratory variability, suggesting right atrial pressure of 3 mmHg.    Patient Profile     76 y.o. male with atrial flutter, currently rate control however not on anticoagulation due to pancytopenia.  Assessment & Plan    Hypotension-still hypotensive.  This morning asymptomatic.  We will continue to monitor the patient.  If blood pressure continues to drop on standing despite IV hydration we will then recommend low-dose midodrine.  Atrial flutter -rate is controlled however still in flutter.  Unfortunately we are unable to anticoagulate this patient given his significant pancytopenia.  Continue his current digoxin dose.  Did not tolerate his beta-blocker.  If needed we can increase the digoxin to 250 mcg.  S/p mitral vale repair-recent echo mitral valve prosthesis appears to be well-seated.  Pancytopenia -plan for bone marrow biopsy today by the primary team.    For questions or updates, please contact [facility] Please consult [email or web address] for contact info under Cardiology/STEMI.      Signed, Jamesina Gaugh, DO  06/09/2021, 9:10 AM

## 2021-06-09 NOTE — Progress Notes (Addendum)
Family Medicine Teaching Service Daily Progress Note Intern Pager: [phone]  Patient name: [name] Medical record number: [id number] Date of birth: [birth year] Age: 76 y.o. Gender: male  Primary Care Provider: Zenia Resides, MD Consultants: Heme/onc Code Status: Full  Pt Overview and Major Events to Date:  06/04/2021-admitted  Assessment and Plan:  [name] is a 76 y.o. 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    Presyncope No events overnight. Patient BP well controlled but as low as 88/62, ranged from 100-124/88-65. -mIVF NS at 75 ml/hr  -Continue to monitor BP -Continue to monitor HR -Monitor for signs and symptoms of presyncope/syncope  Pancytopenia   colon cancer - adenocarcinoma Bone marrow biopsy today or tomorrow. Holding sonidegib for possible etiology of thrombocytopenia/pancytopenia. Plt this morning at 11 (11, 12, 12), WBC 3.2 (3.9, 2.5, 2.7), Hgb 10.0 (10.8, 10.8, 10.1) -Daily CBC, continue to monitor PLT, Hgb, WBC levels -Continue to hold sonidegib -Heme/onc following, appreciate their assistance -Transfuse 1 uints Plt for count less than 10,000 -Monitor for signs and symptoms of infection, if noted start broad spectrum abx   A. Flutter Metoprolol as needed for HR > 110, or for elevated BP. S/p digoxin (.25 mg x 2, 0.125 mg x 1). HR well controlled, ranging from 101-109 bpm.  -Digoxin 0.125 mcg daily  HLD Started on Crestor 20 mg, home med  FEN/GI: Regular Diet PPx: SCDs only 2/2 severe thrombocytopenia Dispo:Home pending clinical improvement . Barriers include bone marrow biopsy today.   Subjective:  NAEON, patient feeling well, with no concerns for bleeding, fatigue, or infection.  Objective: Temp:  [97.5 F (36.4 C)-98.8 F (37.1 C)] 97.5 F (36.4 C) (01/09 0420) Pulse Rate:   [101-103] 102 (01/09 0420) Resp:  [18-21] 20 (01/09 0420) BP: (88-124)/(51-88) 100/65 (01/09 0420) SpO2:  [96 %-98 %] 96 % (01/09 0420) Physical Exam: General: Frail, elderly, NAD, white male Cardiovascular: Distant heart sounds Respiratory: CTABL Abdomen: Soft, NTTP, non-distended Extremities: Moving all extremities independently  Laboratory: Recent Labs  Lab 06/07/21 0553 06/08/21 0240 06/09/21 0528  WBC 2.5* 3.9* 3.2*  HGB 10.8* 10.8* 10.0*  HCT 32.2* 33.0* 30.9*  PLT 12* 12* 11*   Recent Labs  Lab 06/04/21 2137 06/05/21 0620 06/07/21 0553 06/08/21 0240 06/09/21 0528  NA 138   < > 135 138 139  K 4.2   < > 4.3 4.7 4.4  CL 106   < > 104 105 107  CO2 27   < > '26 27 26  ' BUN 27*   < > 29* 32* 30*  CREATININE 1.25*   < > 1.20 1.27* 1.13  CALCIUM 8.6*   < > 9.2 9.0 8.8*  PROT 6.4*  --   --   --   --   BILITOT 0.8  --   --   --   --   ALKPHOS 69  --   --   --   --   ALT 23  --   --   --   --   AST 21  --   --   --   --   GLUCOSE 87   < > 82 102* 104*   < > = values in this interval not displayed.      Imaging/Diagnostic Tests:   Holley Bouche, MD 06/09/2021, 7:35 AM PGY-1, [place] Intern pager: [phone],  text pages welcome

## 2021-06-10 ENCOUNTER — Other Ambulatory Visit (HOSPITAL_COMMUNITY): Payer: Self-pay

## 2021-06-10 ENCOUNTER — Inpatient Hospital Stay (HOSPITAL_COMMUNITY): Payer: Medicare Other

## 2021-06-10 DIAGNOSIS — E785 Hyperlipidemia, unspecified: Secondary | ICD-10-CM | POA: Diagnosis present

## 2021-06-10 DIAGNOSIS — C9200 Acute myeloblastic leukemia, not having achieved remission: Secondary | ICD-10-CM | POA: Diagnosis present

## 2021-06-10 DIAGNOSIS — I4430 Unspecified atrioventricular block: Secondary | ICD-10-CM | POA: Diagnosis not present

## 2021-06-10 DIAGNOSIS — E119 Type 2 diabetes mellitus without complications: Secondary | ICD-10-CM | POA: Diagnosis present

## 2021-06-10 DIAGNOSIS — Z7982 Long term (current) use of aspirin: Secondary | ICD-10-CM | POA: Diagnosis not present

## 2021-06-10 DIAGNOSIS — Z8249 Family history of ischemic heart disease and other diseases of the circulatory system: Secondary | ICD-10-CM | POA: Diagnosis not present

## 2021-06-10 DIAGNOSIS — Z79899 Other long term (current) drug therapy: Secondary | ICD-10-CM | POA: Diagnosis not present

## 2021-06-10 DIAGNOSIS — Z7901 Long term (current) use of anticoagulants: Secondary | ICD-10-CM | POA: Diagnosis not present

## 2021-06-10 DIAGNOSIS — I480 Paroxysmal atrial fibrillation: Secondary | ICD-10-CM | POA: Diagnosis present

## 2021-06-10 DIAGNOSIS — Z951 Presence of aortocoronary bypass graft: Secondary | ICD-10-CM | POA: Diagnosis not present

## 2021-06-10 DIAGNOSIS — Z85828 Personal history of other malignant neoplasm of skin: Secondary | ICD-10-CM | POA: Diagnosis not present

## 2021-06-10 DIAGNOSIS — D509 Iron deficiency anemia, unspecified: Secondary | ICD-10-CM | POA: Diagnosis present

## 2021-06-10 DIAGNOSIS — I484 Atypical atrial flutter: Secondary | ICD-10-CM | POA: Diagnosis not present

## 2021-06-10 DIAGNOSIS — Z9221 Personal history of antineoplastic chemotherapy: Secondary | ICD-10-CM | POA: Diagnosis not present

## 2021-06-10 DIAGNOSIS — D61818 Other pancytopenia: Secondary | ICD-10-CM | POA: Diagnosis present

## 2021-06-10 DIAGNOSIS — Z86718 Personal history of other venous thrombosis and embolism: Secondary | ICD-10-CM | POA: Diagnosis not present

## 2021-06-10 DIAGNOSIS — I483 Typical atrial flutter: Secondary | ICD-10-CM | POA: Diagnosis present

## 2021-06-10 DIAGNOSIS — C187 Malignant neoplasm of sigmoid colon: Secondary | ICD-10-CM

## 2021-06-10 DIAGNOSIS — F17290 Nicotine dependence, other tobacco product, uncomplicated: Secondary | ICD-10-CM | POA: Diagnosis present

## 2021-06-10 DIAGNOSIS — R55 Syncope and collapse: Secondary | ICD-10-CM | POA: Diagnosis present

## 2021-06-10 DIAGNOSIS — Z20822 Contact with and (suspected) exposure to covid-19: Secondary | ICD-10-CM | POA: Diagnosis present

## 2021-06-10 DIAGNOSIS — Z953 Presence of xenogenic heart valve: Secondary | ICD-10-CM | POA: Diagnosis not present

## 2021-06-10 DIAGNOSIS — J449 Chronic obstructive pulmonary disease, unspecified: Secondary | ICD-10-CM | POA: Diagnosis present

## 2021-06-10 DIAGNOSIS — Z85038 Personal history of other malignant neoplasm of large intestine: Secondary | ICD-10-CM | POA: Diagnosis not present

## 2021-06-10 DIAGNOSIS — I959 Hypotension, unspecified: Secondary | ICD-10-CM | POA: Diagnosis not present

## 2021-06-10 DIAGNOSIS — I9589 Other hypotension: Secondary | ICD-10-CM | POA: Diagnosis present

## 2021-06-10 DIAGNOSIS — F0390 Unspecified dementia without behavioral disturbance: Secondary | ICD-10-CM | POA: Diagnosis present

## 2021-06-10 LAB — CBC WITH DIFFERENTIAL/PLATELET
Abs Immature Granulocytes: 0.02 10*3/uL (ref 0.00–0.07)
Basophils Absolute: 0 10*3/uL (ref 0.0–0.1)
Basophils Relative: 0 %
Eosinophils Absolute: 0 10*3/uL (ref 0.0–0.5)
Eosinophils Relative: 0 %
HCT: 29.4 % — ABNORMAL LOW (ref 39.0–52.0)
Hemoglobin: 9.8 g/dL — ABNORMAL LOW (ref 13.0–17.0)
Immature Granulocytes: 1 %
Lymphocytes Relative: 15 %
Lymphs Abs: 0.6 10*3/uL — ABNORMAL LOW (ref 0.7–4.0)
MCH: 28.2 pg (ref 26.0–34.0)
MCHC: 33.3 g/dL (ref 30.0–36.0)
MCV: 84.5 fL (ref 80.0–100.0)
Monocytes Absolute: 0.5 10*3/uL (ref 0.1–1.0)
Monocytes Relative: 13 %
Neutro Abs: 2.8 10*3/uL (ref 1.7–7.7)
Neutrophils Relative %: 71 %
Platelets: 10 10*3/uL — CL (ref 150–400)
RBC: 3.48 MIL/uL — ABNORMAL LOW (ref 4.22–5.81)
RDW: 16.2 % — ABNORMAL HIGH (ref 11.5–15.5)
WBC: 3.9 10*3/uL — ABNORMAL LOW (ref 4.0–10.5)
nRBC: 0 % (ref 0.0–0.2)

## 2021-06-10 MED ORDER — FENTANYL CITRATE (PF) 100 MCG/2ML IJ SOLN
INTRAMUSCULAR | Status: AC | PRN
  Administered 2021-06-10 (×2): 25 ug via INTRAVENOUS

## 2021-06-10 MED ORDER — MIDAZOLAM HCL 2 MG/2ML IJ SOLN
INTRAMUSCULAR | Status: AC | PRN
Start: 2021-06-10 — End: 2021-06-10
  Administered 2021-06-10: .5 mg via INTRAVENOUS
  Administered 2021-06-10: 1 mg via INTRAVENOUS

## 2021-06-10 MED ORDER — ROSUVASTATIN CALCIUM 20 MG PO TABS
20.0000 mg | ORAL_TABLET | Freq: Every day | ORAL | 0 refills | Status: DC
  Filled 2021-06-10: qty 30, 30d supply, fill #0

## 2021-06-10 MED ORDER — DIGOXIN 125 MCG PO TABS
0.1250 mg | ORAL_TABLET | Freq: Every day | ORAL | 0 refills | Status: DC
  Filled 2021-06-10: qty 30, 30d supply, fill #0

## 2021-06-10 MED ORDER — MIDAZOLAM HCL 2 MG/2ML IJ SOLN
INTRAMUSCULAR | Status: AC
  Filled 2021-06-10: qty 2

## 2021-06-10 MED ORDER — LIDOCAINE-EPINEPHRINE 1 %-1:100000 IJ SOLN
INTRAMUSCULAR | Status: AC
  Filled 2021-06-10: qty 1

## 2021-06-10 MED ORDER — FENTANYL CITRATE (PF) 100 MCG/2ML IJ SOLN
INTRAMUSCULAR | Status: AC
  Filled 2021-06-10: qty 2

## 2021-06-10 NOTE — Progress Notes (Signed)
Progress Note  Patient Name: [name] Date of Encounter: 06/10/2021  Primary Cardiologist: Donato Heinz, MD   Subjective   Patient seen examined his bedside.  He offers no complaints at this time.  He is aware of his bone marrow biopsy today.  Inpatient Medications    Scheduled Meds:  digoxin  0.125 mg Oral Daily   ferrous sulfate  325 mg Oral Daily   nicotine  14 mg Transdermal Daily   pantoprazole  40 mg Oral Daily   rosuvastatin  20 mg Oral q1800   umeclidinium bromide  1 puff Inhalation Daily   Continuous Infusions:  sodium chloride 75 mL/hr at 06/10/21 0148   PRN Meds: acetaminophen **OR** acetaminophen, albuterol, metoprolol tartrate   Vital Signs    Vitals:   06/10/21 0830 06/10/21 0835 06/10/21 0840 06/10/21 0855  BP: (!) 108/59 105/65 109/71 (!) 104/57  Pulse: 91 85 83 98  Resp: '20 18 18 15  ' Temp:      TempSrc:      SpO2: 97% 99% 100% 97%  Weight:      Height:        Intake/Output Summary (Last 24 hours) at 06/10/2021 0901 Last data filed at 06/09/2021 1732 Gross per 24 hour  Intake 240 ml  Output 500 ml  Net -260 ml   Filed Weights   06/04/21 2054  Weight: 68.5 kg    Telemetry    Atrial flutter with control reticular rate- Personally Reviewed  ECG    None today- Personally Reviewed  Physical Exam   General: Comfortable, lying in bed Head: Atraumatic, normal size  Eyes: PEERLA, EOMI  Neck: Supple, normal JVD Cardiac: Normal S1, S2; RRR; no murmurs, rubs, or gallops Lungs: Clear to auscultation bilaterally Abd: Soft, nontender, no hepatomegaly  Ext: warm, no edema Musculoskeletal: No deformities, BUE and BLE strength normal and equal Skin: Warm and dry, no rashes   Neuro: Alert and oriented to person, place, time, and situation, CNII-XII grossly intact, no focal deficits  Psych: Normal mood and affect   Labs    Chemistry Recent Labs  Lab 06/04/21 2137 06/05/21 0620 06/07/21 0553 06/08/21 0240 06/09/21 0528   NA 138   < > 135 138 139  K 4.2   < > 4.3 4.7 4.4  CL 106   < > 104 105 107  CO2 27   < > '26 27 26  ' GLUCOSE 87   < > 82 102* 104*  BUN 27*   < > 29* 32* 30*  CREATININE 1.25*   < > 1.20 1.27* 1.13  CALCIUM 8.6*   < > 9.2 9.0 8.8*  PROT 6.4*  --   --   --   --   ALBUMIN 3.2*  --   --   --   --   AST 21  --   --   --   --   ALT 23  --   --   --   --   ALKPHOS 69  --   --   --   --   BILITOT 0.8  --   --   --   --   GFRNONAA 60*   < > >60 59* >60  ANIONGAP 5   < > '5 6 6   ' < > = values in this interval not displayed.     Hematology Recent Labs  Lab 06/08/21 0240 06/09/21 0528 06/10/21 0020  WBC 3.9* 3.2* 3.9*  RBC 3.92* 3.68* 3.48*  HGB 10.8* 10.0* 9.8*  HCT 33.0* 30.9* 29.4*  MCV 84.2 84.0 84.5  MCH 27.6 27.2 28.2  MCHC 32.7 32.4 33.3  RDW 16.3* 16.3* 16.2*  PLT 12* 11* 10*    Cardiac EnzymesNo results for input(s): TROPONINI in the last 168 hours. No results for input(s): TROPIPOC in the last 168 hours.   BNPNo results for input(s): BNP, PROBNP in the last 168 hours.   DDimer No results for input(s): DDIMER in the last 168 hours.   Radiology    No results found.  Cardiac Studies   Echo 09/2020 1. Left ventricular ejection fraction, by estimation, is 65 to 70%. The left ventricle has normal function. The left ventricle has no regional wall motion abnormalities. Left ventricular diastolic function could not be evaluated. 2. Right ventricular systolic function is normal. The right ventricular size is mildly enlarged. There is normal pulmonary artery systolic pressure. The estimated right ventricular systolic pressure is 69.4 mmHg. 3. 31 mm mosaic prosthetic valve with MG 7 mmHG @ 130 bpm. EOA 2.32 cm2. DI 1.6. Normal prosthesis with higher gradients due to tachycardia. The mitral valve has been repaired/replaced. No evidence of mitral valve regurgitation. There is a 31 mm Medtronic Mosaic bioprosthetic valve present in the mitral position. Procedure Date:  07/14/2016. 4. The aortic valve is tricuspid. Aortic valve regurgitation is not visualized. No aortic stenosis is present. 5. The inferior vena cava is normal in size with greater than 50% respiratory variability, suggesting right atrial pressure of 3 mmHg.    Patient Profile     76 y.o. male with atrial flutter, currently rate control however not on anticoagulation due to pancytopenia.  Assessment & Plan    Hypotension-still hypotensive.  Asymptomatic-  We will continue to monitor the patient.  I if becomes symptomatic would recommend using midodrine.  Atrial flutter -rate is controlled however still in flutter.  Unfortunately we are unable to anticoagulate this patient given his significant pancytopenia.  Continue his current digoxin dose.  Did not tolerate his beta-blocker.  If needed we can increase the digoxin to 250 mcg.  S/p mitral vale repair-recent echo mitral valve prosthesis appears to be well-seated.  Pancytopenia -plan for bone marrow biopsy today by the primary team.     For questions or updates, please contact [place] Please consult [email or web address] for contact info under Cardiology/STEMI.      Signed, Berniece Salines, DO  06/10/2021, 9:01 AM

## 2021-06-10 NOTE — Procedures (Signed)
Pre-procedure Diagnosis: Pancytopenia Post-procedure Diagnosis: Same  Technically successful CT guided bone marrow aspiration and biopsy of left iliac crest.   Complications: None Immediate  EBL: None  Signed: Sandi Mariscal Pager: [phone] 06/10/2021, 9:19 AM

## 2021-06-10 NOTE — Discharge Instructions (Addendum)
Dear [name],  Thank you for letting us participate in your care. You came in for nearly fainting (pre-syncope) and were found to have abnormally low platelets, red blood cells, and white blood cells (Pancytopenia). You underwent a bone marrow biopsy to investigate the cause of these abnormally low cell lines.   POST-HOSPITAL & CARE INSTRUCTIONS Changes we made: We stopped your Sonidegib, as it may be causing your abnormally low cell lines. Go to your follow up appointments PCP, Oncology (listed below) Seek medical attention if: You began to feel ill, develop a fever, cough, issues with urination (pain, mal odor, increased urinary frequency) or an infection You feel weak, tired, fatigued, pale, dizzy, a rash that looks like pinpoint purplish-red spots on skin or mucous membranes You start bleeding, develop a bruise, have any trauma or accident You feel light headed, dizzy, or that you may pass out   DOCTOR'S APPOINTMENT   Future Appointments  Date Time Provider [place]  06/13/2021  1:15 PM [facility] PHLEBOTOMIST [facility] None  06/13/2021  1:45 PM Owens Shark, NP [facility] None  06/16/2021 11:10 AM Zenia Resides, MD [facility]  06/19/2021  7:00 AM [facility] RAD ROOM [facility] None     Take care and be well!  [facility]  [address], [place] [address] [phone]

## 2021-06-10 NOTE — Progress Notes (Signed)
PT Cancellation Note  Patient Details Name: [name] MRN: [id number] DOB: September 14, 1945   Cancelled Treatment:    Reason Eval/Treat Not Completed: (P) Medical issues which prohibited therapy PT cancelled yesterday due to low platelet count. Platelet count lower today and awaiting guidance from physician on how to proceed. PT will follow back when status is clarified.  Leslieanne Cobarrubias B. Migdalia Dk PT, DPT Acute Rehabilitation Services Pager [phone] Office [phone]    [place] 06/10/2021, 12:19 PM

## 2021-06-10 NOTE — Discharge Summary (Signed)
[facility] Discharge Summary  Thomas name: [name] Medical record number: [id number] Date of birth: 11/01/1945 Age: 76 y.o. Gender: male Date of Admission: 06/04/2021  Date of Discharge: 06/10/21 Admitting Physician: Rise Patience, MD  Primary Care Provider: Zenia Resides, MD Consultants: Cardiology, Oncology  Indication for Hospitalization: Shawn  Discharge Diagnoses/Problem List:  Shawn Pancytopenia Paroxysmal Atrial Fib   Atrial Flutter   Disposition: Home  Discharge Condition: Improved  Discharge Exam:  Temp:  [97.7 F (36.5 C)-99.5 F (37.5 C)] 98.7 F (37.1 C) (01/10 0415) Pulse Rate:  [38-120] 38 (01/10 0415) Resp:  [20-23] 21 (01/10 0415) BP: (76-116)/(54-75) 107/58 (01/10 0415) SpO2:  [91 %-97 %] 91 % (01/10 0415) Physical Exam: General: Elderly, frail, NAD, White male Cardiovascular: CTABL Respiratory: S1/S2, NRMG Abdomen: Soft, NTTP, non-distended Extremities: Moving all extremities independently  Brief Hospital Course:  [name] is a 76 y.o. 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 His hospital course is below.  [name] presented after episode of Shawn when he was walking in his home. Thomas BP was appreciated to be 119/66 on admission. Orthostatic vitals were taken and were not observed to be abnormal. Thomas was able to walk around room without symptoms of Shawn the following day. By discharge, Thomas had no more complaints of syncope/Shawn.  Pancytopenia WBC: 1.6 low, Hgb 9.5 low, PLT 13 crit low on admission. Thomas has a history of chronic thrombocytopenia but PLT were critically low on admission. Thomas had bone marrow biopsy sched for 06/19/21,  but after reaching out to Thomas's oncologist, he  felt the biopsy should happen while Thomas was inpatient. Thomas had bone marrow biopsy on 06/10/21.  Oncology was worried that pancytopenia was coming from [place]. This med was held for hospital stay. Thomas PLT level dropped as low as 10K, with the transfusion threshold determined to be anything lower than 10 K. Thomas did not receive a platelet transfusion while admitted.   PAF   A. Flutter Thomas presented in A. Flutter with a 4-1 block, rate controlled in the 50-60's. Thomas Cardizem and metoprolol were held for his low heart rate. Patent was started on digoxin for rate control.   All other conditions were chronic and stable  Issues for follow up: 1) Holding Sonidegib, oncology to consider restart 2) Started on digoxin, consider taking a level 3) Metoprolol and cardizem held during admission, consider restart in outpatient setting 4) Follow up bone marrow biopsy results   Significant Procedures:  Bone marrow biopsy  Significant Labs and Imaging:  Recent Labs  Lab 06/08/21 0240 06/09/21 0528 06/10/21 0020  WBC 3.9* 3.2* 3.9*  HGB 10.8* 10.0* 9.8*  HCT 33.0* 30.9* 29.4*  PLT 12* 11* 10*   Recent Labs  Lab 06/04/21 2137 06/05/21 0620 06/07/21 0553 06/08/21 0240 06/09/21 0528  NA 138 136 135 138 139  K 4.2 5.0 4.3 4.7 4.4  CL 106 108 104 105 107  CO2 '27 24 26 27 26  ' GLUCOSE 87 82 82 102* 104*  BUN 27* 23 29* 32* 30*  CREATININE 1.25* 1.10 1.20 1.27* 1.13  CALCIUM 8.6* 8.4* 9.2 9.0 8.8*  MG 2.3  --   --   --   --   ALKPHOS 69  --   --   --   --   AST 21  --   --   --   --  ALT 23  --   --   --   --   ALBUMIN 3.2*  --   --   --   --       Results/Tests Pending at Time of Discharge:  Bone marrow biopsy results  Discharge Medications:  Allergies as of 06/10/2021       Reactions   Orange Juice [orange Oil] Hives        Medication List     STOP taking these medications    aspirin EC 325 MG tablet   capecitabine 500 MG tablet Commonly known  as: XELODA   diltiazem 120 MG 24 hr capsule Commonly known as: CARDIZEM CD   Eliquis 5 MG Tabs tablet Generic drug: apixaban   ferrous sulfate 325 (65 FE) MG tablet   metoprolol tartrate 25 MG tablet Commonly known as: LOPRESSOR       TAKE these medications    acetaminophen 325 MG tablet Commonly known as: TYLENOL Take 2 tablets (650 mg total) by mouth every 6 (six) hours as needed for mild pain or fever.   digoxin 0.125 MG tablet Commonly known as: LANOXIN Take 1 tablet (0.125 mg total) by mouth daily. Start taking on: June 11, 2021   pantoprazole 40 MG tablet Commonly known as: PROTONIX Take 1 tablet (40 mg total) by mouth daily at 6 (six) AM. What changed: when to take this   rosuvastatin 20 MG tablet Commonly known as: CRESTOR Take 1 tablet (20 mg total) by mouth daily at 6 PM.   Spiriva HandiHaler 18 MCG inhalation capsule Generic drug: tiotropium PLACE ONE CAPSULE INTO THE INHALER AND INHALE DAILY What changed: See the new instructions.   Ventolin HFA 108 (90 Base) MCG/ACT inhaler Generic drug: albuterol Inhale 1-2 puffs into the lungs every 6 (six) hours as needed for wheezing or shortness of breath.        Discharge Instructions: Please refer to Thomas Instructions section of EMR for full details.  Thomas was counseled important signs and symptoms that should prompt return to medical care, changes in medications, dietary instructions, activity restrictions, and follow up appointments.   Follow-Up Appointments:   Holley Bouche, MD 06/10/2021, 2:04 PM PGY-1, [place]

## 2021-06-10 NOTE — Progress Notes (Signed)
Physical Therapy Treatment Patient Details Name: [name] MRN: [id number] DOB: 1946/03/15 Today's Date: 06/10/2021   History of Present Illness Pt is a 77 y/o male admitted 1/4 secondary to dizziness and near syncope. MRI negative. PMH includes dementia, COPD, Colon cancer with chronic thrombocytopenia, CABG, s/p MVR, and a fib.    PT Comments    Pt platelet count at 10, but when inquired MD reports he is discharging home this afternoon, so it would be good for him to work with therapy. Pt supine in bed on entry, very complimentary of staff on 6E, but also reports he is ready to go home. Pt is able to ambulate in the hallway with supervision. HR sustained at about 125 bpm, and BP in standing 127/73. Pt reports feeling much better, no dizziness or lightheadedness with standing.  Pt and son report having everything they need at home.    Recommendations for follow up therapy are one component of a multi-disciplinary discharge planning process, led by the attending physician.  Recommendations may be updated based on patient status, additional functional criteria and insurance authorization.  Follow Up Recommendations  No PT follow up     Assistance Recommended at Discharge Frequent or constant Supervision/Assistance  Patient can return home with the following Assistance with cooking/housework;Direct supervision/assist for financial management;Direct supervision/assist for medications management   Equipment Recommendations  None recommended by PT       Precautions / Restrictions Precautions Precautions: Fall Restrictions Weight Bearing Restrictions: No     Mobility  Bed Mobility Overal bed mobility: Needs Assistance Bed Mobility: Supine to Sit;Sit to Supine     Supine to sit: Supervision Sit to supine: Supervision   General bed mobility comments: supervision for safety    Transfers Overall transfer level: Needs assistance Equipment used: None Transfers: Sit to/from  Stand Sit to Stand: Min guard           General transfer comment: Min guard for safety to stand from stretcher    Ambulation/Gait Ambulation/Gait assistance: Min guard   Assistive device: None Gait Pattern/deviations: Step-through pattern;Decreased stride length Gait velocity: Decreased Gait velocity interpretation: <1.31 ft/sec, indicative of household ambulator   General Gait Details: Min guard for safety.no dizziness or lightheadedness reported, mild instability but no overt LoB          Balance Overall balance assessment: Mild deficits observed, not formally tested                                          Cognition Arousal/Alertness: Awake/alert Behavior During Therapy: WFL for tasks assessed/performed Overall Cognitive Status: History of cognitive impairments - at baseline                                 General Comments: Dementia at baseline           General Comments General comments (skin integrity, edema, etc.): BP with standing and mobility 123/73, HR in 120s with ambulation, SpO2 on RA 96%O2      Pertinent Vitals/Pain Pain Assessment: No/denies pain     PT Goals (current goals can now be found in the care plan section) Acute Rehab PT Goals Patient Stated Goal: to go home PT Goal Formulation: With patient Time For Goal Achievement: 06/19/21 Potential to Achieve Goals: Good Progress towards PT goals: Progressing toward goals  Frequency    Min 3X/week      PT Plan Current plan remains appropriate       AM-PAC PT "6 Clicks" Mobility   Outcome Measure  Help needed turning from your back to your side while in a flat bed without using bedrails?: None Help needed moving from lying on your back to sitting on the side of a flat bed without using bedrails?: None Help needed moving to and from a bed to a chair (including a wheelchair)?: A Little Help needed standing up from a chair using your arms (e.g.,  wheelchair or bedside chair)?: A Little Help needed to walk in hospital room?: A Little Help needed climbing 3-5 steps with a railing? : A Little 6 Click Score: 20    End of Session Equipment Utilized During Treatment: Gait belt Activity Tolerance: Patient tolerated treatment well Patient left: in bed;with call bell/phone within reach;with family/visitor present Nurse Communication: Mobility status PT Visit Diagnosis: Other abnormalities of gait and mobility (R26.89);Muscle weakness (generalized) (M62.81)     Time: 1497-0263 PT Time Calculation (min) (ACUTE ONLY): 22 min  Charges:  $Therapeutic Exercise: 8-22 mins                     [place] Corella B. Migdalia Dk PT, DPT Acute Rehabilitation Services Pager [phone] Office [phone]    [place] 06/10/2021, 2:55 PM

## 2021-06-10 NOTE — TOC Transition Note (Signed)
Transition of Care [facility]) - CM/SW Discharge Note   Patient Details  Name: [name] MRN: [id number] Date of Birth: [birth year]  Transition of Care [facility]) CM/SW Contact:  Milas Gain, [place] Phone Number: 06/10/2021, 5:30 PM   Clinical Narrative:     Patient will DC to: Home  Anticipated DC date: 06/10/2021  Family notified: Patient declined  Transport by: [facility]    CSW signing off.         Patient Goals and CMS Choice        Discharge Placement                       Discharge Plan and Services                                     Social Determinants of Health (SDOH) Interventions     Readmission Risk Interventions No flowsheet data found.

## 2021-06-10 NOTE — Hospital Course (Addendum)
[name] is a 76 y.o. 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 His hospital course is below.  Presyncope Patient presented after episode of presyncope when he was walking in his home. Patient BP was appreciated to be 119/66 on admission. Orthostatic vitals were taken and were not observed to be abnormal. Patient was able to walk around room without symptoms of presyncope the following day. By discharge, patient had no more complaints of syncope/presyncope.  Pancytopenia WBC: 1.6 low, Hgb 9.5 low, PLT 13 crit low on admission. Patient has a history of chronic thrombocytopenia but PLT were critically low on admission. Patient had bone marrow biopsy sched for 06/19/21,  but after reaching out to patient's oncologist, he felt the biopsy should happen while patient was inpatient. Patient had bone marrow biopsy on 06/10/21.  Oncology was worried that pancytopenia was coming from [place]. This med was held for hospital stay. Patient PLT level dropped as low as 10K, with the transfusion threshold determined to be anything lower than 10 K. Patient did not receive a platelet transfusion while admitted.   PAF   A. Flutter Patient presented in A. Flutter with a 4-1 block, rate controlled in the 50-60's. Patient Cardizem and metoprolol were held for his low heart rate. Patent was started on digoxin for rate control.   All other conditions were chronic and stable  Issues for follow up: 1) Holding Sonidegib, oncology to consider restart 2) Started on digoxin, consider taking a level 3) Metoprolol and cardizem held during admission, consider restart in outpatient setting 4) Follow up bone marrow biopsy results

## 2021-06-10 NOTE — Progress Notes (Addendum)
Brief oncology note: ° °Call placed to pathology to request rush on bone marrow biopsy result for this patient.  Outpatient follow-up has been scheduled later this week to discuss the biopsy results. ° °Future Appointments  °Date Time Provider Department Center  °06/13/2021  1:15 PM [facility] PHLEBOTOMIST [facility] None  °06/13/2021  1:45 PM Thomas, Lisa K, NP [facility] None  °06/16/2021 11:10 AM Hensel, William A, MD [facility]  °06/19/2021  7:00 AM [facility] RAD ROOM [facility] None  °  ° ° , DNP, AGPCNP-BC, AOCNP ° °

## 2021-06-12 ENCOUNTER — Telehealth: Payer: Self-pay

## 2021-06-12 ENCOUNTER — Other Ambulatory Visit: Payer: Self-pay | Admitting: Nurse Practitioner

## 2021-06-12 DIAGNOSIS — D696 Thrombocytopenia, unspecified: Secondary | ICD-10-CM

## 2021-06-12 LAB — SURGICAL PATHOLOGY

## 2021-06-12 NOTE — Telephone Encounter (Signed)
Forward to [name]: he could not come in today for lab. he schedule for lab on 06/13/21 at 1045 and his appointment change to 1115

## 2021-06-12 NOTE — Telephone Encounter (Signed)
-----   Message from Owens Shark, NP sent at 06/12/2021 10:33 AM EST ----- Please call him-we would like for him to come in today for labs.  Also need to change his appointment tomorrow to 1115.

## 2021-06-13 ENCOUNTER — Encounter: Payer: Self-pay | Admitting: Nurse Practitioner

## 2021-06-13 ENCOUNTER — Inpatient Hospital Stay (HOSPITAL_BASED_OUTPATIENT_CLINIC_OR_DEPARTMENT_OTHER): Payer: Medicare Other | Admitting: Nurse Practitioner

## 2021-06-13 ENCOUNTER — Other Ambulatory Visit: Payer: Self-pay

## 2021-06-13 ENCOUNTER — Inpatient Hospital Stay: Payer: Medicare Other | Attending: Nurse Practitioner

## 2021-06-13 ENCOUNTER — Other Ambulatory Visit: Payer: Commercial Managed Care - HMO

## 2021-06-13 ENCOUNTER — Ambulatory Visit: Payer: Commercial Managed Care - HMO | Admitting: Nurse Practitioner

## 2021-06-13 VITALS — BP 105/60 | HR 98 | Temp 97.8°F | Resp 18 | Ht 72.0 in | Wt 151.2 lb

## 2021-06-13 DIAGNOSIS — C187 Malignant neoplasm of sigmoid colon: Secondary | ICD-10-CM | POA: Diagnosis not present

## 2021-06-13 DIAGNOSIS — J449 Chronic obstructive pulmonary disease, unspecified: Secondary | ICD-10-CM | POA: Diagnosis not present

## 2021-06-13 DIAGNOSIS — D509 Iron deficiency anemia, unspecified: Secondary | ICD-10-CM | POA: Insufficient documentation

## 2021-06-13 DIAGNOSIS — D696 Thrombocytopenia, unspecified: Secondary | ICD-10-CM | POA: Diagnosis not present

## 2021-06-13 DIAGNOSIS — C9200 Acute myeloblastic leukemia, not having achieved remission: Secondary | ICD-10-CM | POA: Diagnosis not present

## 2021-06-13 DIAGNOSIS — E118 Type 2 diabetes mellitus with unspecified complications: Secondary | ICD-10-CM | POA: Diagnosis not present

## 2021-06-13 DIAGNOSIS — I4892 Unspecified atrial flutter: Secondary | ICD-10-CM | POA: Diagnosis not present

## 2021-06-13 DIAGNOSIS — Z5189 Encounter for other specified aftercare: Secondary | ICD-10-CM | POA: Insufficient documentation

## 2021-06-13 DIAGNOSIS — I10 Essential (primary) hypertension: Secondary | ICD-10-CM | POA: Diagnosis not present

## 2021-06-13 DIAGNOSIS — F0390 Unspecified dementia without behavioral disturbance: Secondary | ICD-10-CM | POA: Diagnosis not present

## 2021-06-13 DIAGNOSIS — Z5111 Encounter for antineoplastic chemotherapy: Secondary | ICD-10-CM | POA: Diagnosis not present

## 2021-06-13 DIAGNOSIS — F17210 Nicotine dependence, cigarettes, uncomplicated: Secondary | ICD-10-CM | POA: Diagnosis not present

## 2021-06-13 LAB — CBC WITH DIFFERENTIAL (CANCER CENTER ONLY)
Abs Immature Granulocytes: 0.01 10*3/uL (ref 0.00–0.07)
Basophils Absolute: 0 10*3/uL (ref 0.0–0.1)
Basophils Relative: 0 %
Eosinophils Absolute: 0 10*3/uL (ref 0.0–0.5)
Eosinophils Relative: 1 %
HCT: 31 % — ABNORMAL LOW (ref 39.0–52.0)
Hemoglobin: 10 g/dL — ABNORMAL LOW (ref 13.0–17.0)
Immature Granulocytes: 1 %
Lymphocytes Relative: 30 %
Lymphs Abs: 0.4 10*3/uL — ABNORMAL LOW (ref 0.7–4.0)
MCH: 27.2 pg (ref 26.0–34.0)
MCHC: 32.3 g/dL (ref 30.0–36.0)
MCV: 84.2 fL (ref 80.0–100.0)
Monocytes Absolute: 0.2 10*3/uL (ref 0.1–1.0)
Monocytes Relative: 12 %
Neutro Abs: 0.8 10*3/uL — ABNORMAL LOW (ref 1.7–7.7)
Neutrophils Relative %: 56 %
Platelet Count: 17 10*3/uL — ABNORMAL LOW (ref 150–400)
RBC: 3.68 MIL/uL — ABNORMAL LOW (ref 4.22–5.81)
RDW: 16.1 % — ABNORMAL HIGH (ref 11.5–15.5)
WBC Count: 1.4 10*3/uL — ABNORMAL LOW (ref 4.0–10.5)
nRBC: 0 % (ref 0.0–0.2)

## 2021-06-13 LAB — SAMPLE TO BLOOD BANK

## 2021-06-13 NOTE — Progress Notes (Signed)
[facility] OFFICE PROGRESS NOTE   Diagnosis: Colon cancer, thrombocytopenia  INTERVAL HISTORY:   [name] returns for follow-up.  He underwent a bone marrow biopsy 06/10/2021.   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    He denies bleeding.  No fever or chills.  He denies pain.  No nausea or vomiting.  No diarrhea.  Objective:  Vital signs in last 24 hours:  Blood pressure 105/60, pulse 98, temperature 97.8 F (36.6 C), temperature source Oral, resp. rate 18, height 6' (1.829 m), weight 151 lb 3.2 oz (68.6 kg), SpO2 98 %.    HEENT: No thrush or ulcers. Resp: Bilateral rhonchi.  No respiratory distress. Cardio: Regular rate and rhythm. GI: No hepatomegaly.  Left lower quadrant colostomy.  Brown stool in the collection bag. Vascular: No leg edema. Skin: Small ecchymoses/petechiae at the lower legs.  Decreased size and thickness of the skin lesion at the left wrist and of the left forearm.  Small ecchymosis at bone marrow biopsy site.   Lab Results:  Lab Results  Component Value Date   WBC 1.4 (L) 06/13/2021   HGB 10.0 (L) 06/13/2021   HCT 31.0 (L) 06/13/2021   MCV 84.2 06/13/2021   PLT 17 (L) 06/13/2021   NEUTROABS 0.8 (L) 06/13/2021    Imaging:  No results found.  Medications: I have reviewed the patient's current medications.  Assessment/Plan: Sigmoid colon adenocarcinoma -Colonoscopy 10/21/2020- partially obstructing mass found in the sigmoid colon consistent with adenocarcinoma. -CEA on 10/22/2020 was 3.9 -CTs 10/22/2020-colonic mass with pelvic lymphadenopathy including right common iliac and external iliac nodes, small pulmonary nodules -10/24/2020-sigmoid colectomy, end colostomy, tumor stuck to pelvic sidewall with rind of tissue remaining at completion of surgery -Pathology- moderately differentiated adenocarcinoma the sigmoid colon, tumor extends into pericolonic connective tissue,  no lymphovascular perineural invasion, 0/14 lymph nodes, carcinoma involves an area with the specimen was disrupted at mesenteric margin; mismatch repair protein (IHC) normal; MSI stable -CTs 11/24/2020-interval resection of colonic mass, decreased size of previously noted right pelvic adenopathy, new noted at the right internal/external iliac bifurcation, unchanged pulmonary nodules -Guardant reveal 12/06/2020-ctDNA not detected -Cycle 1 adjuvant Xeloda anticipated 01/06/2021 -Cycle 2 adjuvant Xeloda 01/27/2021, Xeloda dose reduced secondary to thrombocytopenia (he took 1 pill a day for an unclear amount of time, less than 14 days) Cycle 3 adjuvant Xeloda 02/17/2021 2.  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 3.  Thrombocytopenia-chronic 4.  Atrial flutter 5.  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  COPD 7.  Hypertension 8.  Diabetes mellitus 9.  Dementia 10.  Tobacco dependence 11. Left wrist basal cell carcinoma -Skin biopsy 08/16/2020 - Basal cell carcinoma, nodular and infiltrative patterns, peripheral and deep margins involved -Seen by radiation oncology 09/03/2020 -Established care with the wound center on 09/13/2019 -Placed on Sonidegib by dermatology, marked clinical improvement 12.  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  Admission 06/05/2021 with presyncope 14.  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  The recent bone marrow biopsy showed changes of myelodysplasia evolving into acute myeloid  leukemia.  Dr. Benay Spice reviewed  the diagnosis, prognosis and treatment options with [name] at today's visit.  We discussed contacting the leukemia service at [facility] for admission/induction therapy.  [name] declines this option.  We also discussed treatment with 5-azacytidine versus a supportive care approach with a hospice referral.  He would like to try 5-azacytidine.  We reviewed potential side effects including bone marrow toxicity, nausea, constipation or diarrhea.  He agrees to proceed.  He was provided with printed information on 5-azacytidine as well.  We will make arrangements for a chemotherapy education class.  Tentative plan is to begin cycle 1 on 06/16/2021.  We will see him in follow-up on 06/26/2021.  Patient seen with Dr. Benay Spice.    Ned Card ANP/GNP-BC   06/13/2021  12:40 PM  This was a shared visit with Ned Card.  [name] was interviewed and examined.  We discussed the bone marrow biopsy findings with [name].  I discussed the case with the hematopathologist.  There is evidence of a myelodysplasia evolving into AML.  Cytogenetics and a leukemia FISH panel are pending.  We discussed the poor prognosis and treatment options with [name].  He declines a referral to the leukemia service at [facility].  I recommend treatment with 5-azacytidine.  I do not feel he is a candidate for combined treatment with 5-azacytidine and venetoclax.  We discussed potential toxicities associated with 5-azacytidine including the chance of hematologic toxicity with infection and bleeding.  He agrees to proceed.  The plan is to begin treatment with 5-azacytidine on 06/16/2021.  A chemotherapy plan was entered today.  He will attend a chemotherapy teaching class.  We will consult with the Cancer center pharmacy to be sure it is okay to cover administer sondegib and 5-azacytidine.  I was present for greater than 50% of today's visit.  I performed medical decision making.  Julieanne Manson, MD

## 2021-06-13 NOTE — Progress Notes (Signed)
Pharmacist Chemotherapy Monitoring - Initial Assessment    Anticipated start date: 06/16/21   The following has been reviewed per standard work regarding the patient's treatment regimen: The patient's diagnosis, treatment plan and drug doses, and organ/hematologic function Lab orders and baseline tests specific to treatment regimen  The treatment plan start date, drug sequencing, and pre-medications Prior authorization status  Patient's documented medication list, including drug-drug interaction screen and prescriptions for anti-emetics and supportive care specific to the treatment regimen The drug concentrations, fluid compatibility, administration routes, and timing of the medications to be used The patient's access for treatment and lifetime cumulative dose history, if applicable  The patient's medication allergies and previous infusion related reactions, if applicable   Changes made to treatment plan:  N/A  Follow up needed:  N/A   [name], [facility], 06/13/2021  3:56 PM

## 2021-06-13 NOTE — Progress Notes (Signed)
START ON PATHWAY REGIMEN - MDS     A cycle is every 28 days:     Azacitidine   **Always confirm dose/schedule in your pharmacy ordering system**  Patient Characteristics: Higher-Risk (IPSS-R Score > 3.5), First Line, Not a Transplant Candidate WHO Disease Classification: MDS-MLD Bone Marrow Blasts (percent): > 10% Cytogenetic Category: Unknown Platelets (x 10^9/L): < 50 Absolute Neutrophil Count (x 10^9/L): ? 0.8 Line of Therapy: First Line IPSS-R Risk Category: Unknown IPSS-R Risk Score: Unknown Check here if patient's risk score was calculated prior to the International Prognostic Scoring System-Revised (IPSS-R): false Hemoglobin (g/dl): ? 10 Patient Characteristics: Not a Transplant Candidate Intent of Therapy: Non-Curative / Palliative Intent, Discussed with Patient

## 2021-06-15 ENCOUNTER — Other Ambulatory Visit: Payer: Self-pay | Admitting: Oncology

## 2021-06-16 ENCOUNTER — Inpatient Hospital Stay: Payer: Medicare Other

## 2021-06-16 ENCOUNTER — Encounter (HOSPITAL_COMMUNITY): Payer: Self-pay

## 2021-06-16 ENCOUNTER — Inpatient Hospital Stay: Payer: Commercial Managed Care - HMO | Admitting: Family Medicine

## 2021-06-16 ENCOUNTER — Other Ambulatory Visit: Payer: Self-pay

## 2021-06-16 VITALS — BP 127/79 | HR 98 | Temp 98.7°F | Resp 18 | Ht 72.0 in | Wt 151.0 lb

## 2021-06-16 DIAGNOSIS — D696 Thrombocytopenia, unspecified: Secondary | ICD-10-CM

## 2021-06-16 DIAGNOSIS — C9200 Acute myeloblastic leukemia, not having achieved remission: Secondary | ICD-10-CM

## 2021-06-16 LAB — CBC WITH DIFFERENTIAL (CANCER CENTER ONLY)
Abs Immature Granulocytes: 0.01 10*3/uL (ref 0.00–0.07)
Basophils Absolute: 0 10*3/uL (ref 0.0–0.1)
Basophils Relative: 0 %
Eosinophils Absolute: 0 10*3/uL (ref 0.0–0.5)
Eosinophils Relative: 0 %
HCT: 31.3 % — ABNORMAL LOW (ref 39.0–52.0)
Hemoglobin: 10.3 g/dL — ABNORMAL LOW (ref 13.0–17.0)
Immature Granulocytes: 1 %
Lymphocytes Relative: 36 %
Lymphs Abs: 0.6 10*3/uL — ABNORMAL LOW (ref 0.7–4.0)
MCH: 27.6 pg (ref 26.0–34.0)
MCHC: 32.9 g/dL (ref 30.0–36.0)
MCV: 83.9 fL (ref 80.0–100.0)
Monocytes Absolute: 0.1 10*3/uL (ref 0.1–1.0)
Monocytes Relative: 9 %
Neutro Abs: 0.9 10*3/uL — ABNORMAL LOW (ref 1.7–7.7)
Neutrophils Relative %: 54 %
Platelet Count: 17 10*3/uL — ABNORMAL LOW (ref 150–400)
RBC: 3.73 MIL/uL — ABNORMAL LOW (ref 4.22–5.81)
RDW: 16.1 % — ABNORMAL HIGH (ref 11.5–15.5)
WBC Count: 1.6 10*3/uL — ABNORMAL LOW (ref 4.0–10.5)
nRBC: 0 % (ref 0.0–0.2)

## 2021-06-16 LAB — CMP (CANCER CENTER ONLY)
ALT: 23 U/L (ref 0–44)
AST: 18 U/L (ref 15–41)
Albumin: 3.9 g/dL (ref 3.5–5.0)
Alkaline Phosphatase: 71 U/L (ref 38–126)
Anion gap: 7 (ref 5–15)
BUN: 23 mg/dL (ref 8–23)
CO2: 28 mmol/L (ref 22–32)
Calcium: 9.2 mg/dL (ref 8.9–10.3)
Chloride: 104 mmol/L (ref 98–111)
Creatinine: 0.97 mg/dL (ref 0.61–1.24)
GFR, Estimated: >60 mL/min (ref >60)
Glucose, Bld: 96 mg/dL (ref 70–99)
Potassium: 4.3 mmol/L (ref 3.5–5.1)
Sodium: 139 mmol/L (ref 135–145)
Total Bilirubin: 1 mg/dL (ref 0.3–1.2)
Total Protein: 7.3 g/dL (ref 6.5–8.1)

## 2021-06-16 LAB — LACTATE DEHYDROGENASE: LDH: 160 U/L (ref 98–192)

## 2021-06-16 MED ORDER — AZACITIDINE CHEMO SQ INJECTION
50.0000 mg/m2 | Freq: Once | INTRAMUSCULAR | Status: AC
  Administered 2021-06-16: 92.5 mg via SUBCUTANEOUS
  Filled 2021-06-16: qty 3.7

## 2021-06-16 MED ORDER — ONDANSETRON HCL 8 MG PO TABS
8.0000 mg | ORAL_TABLET | Freq: Once | ORAL | Status: AC
  Administered 2021-06-16: 8 mg via ORAL
  Filled 2021-06-16: qty 1

## 2021-06-16 NOTE — Progress Notes (Signed)
Verbal order per Dr. Benay Spice: okay to treat with labs out of parameters.

## 2021-06-16 NOTE — Patient Instructions (Signed)
[facility]   Discharge Instructions: Thank you for choosing [facility] to provide your oncology and hematology care.   If you have a lab appointment with the [facility], please go directly to the [facility] and check in at the registration area.   Wear comfortable clothing and clothing appropriate for easy access to any Portacath or PICC line.   We strive to give you quality time with your provider. You may need to reschedule your appointment if you arrive late (15 or more minutes).  Arriving late affects you and other patients whose appointments are after yours.  Also, if you miss three or more appointments without notifying the office, you may be dismissed from the clinic at the providers discretion.      For prescription refill requests, have your pharmacy contact our office and allow 72 hours for refills to be completed.    Today you received the following chemotherapy and/or immunotherapy agents Azacitidine (VIDAZA).      To help prevent nausea and vomiting after your treatment, we encourage you to take your nausea medication as directed.  BELOW ARE SYMPTOMS THAT SHOULD BE REPORTED IMMEDIATELY: *FEVER GREATER THAN 100.4 F (38 C) OR HIGHER *CHILLS OR SWEATING *NAUSEA AND VOMITING THAT IS NOT CONTROLLED WITH YOUR NAUSEA MEDICATION *UNUSUAL SHORTNESS OF BREATH *UNUSUAL BRUISING OR BLEEDING *URINARY PROBLEMS (pain or burning when urinating, or frequent urination) *BOWEL PROBLEMS (unusual diarrhea, constipation, pain near the anus) TENDERNESS IN MOUTH AND THROAT WITH OR WITHOUT PRESENCE OF ULCERS (sore throat, sores in mouth, or a toothache) UNUSUAL RASH, SWELLING OR PAIN  UNUSUAL VAGINAL DISCHARGE OR ITCHING   Items with * indicate a potential emergency and should be followed up as soon as possible or go to the Emergency Department if any problems should occur.  Please show the CHEMOTHERAPY ALERT CARD or IMMUNOTHERAPY ALERT CARD at  check-in to the Emergency Department and triage nurse.  Should you have questions after your visit or need to cancel or reschedule your appointment, please contact [facility]  Dept: [phone]  and follow the prompts.  Office hours are 8:00 a.m. to 4:30 p.m. Monday - Friday. Please note that voicemails left after 4:00 p.m. may not be returned until the following business day.  We are closed weekends and major holidays. You have access to a nurse at all times for urgent questions. Please call the main number to the clinic Dept: [phone] and follow the prompts.   For any non-urgent questions, you may also contact your provider using MyChart. We now offer e-Visits for anyone 28 and older to request care online for non-urgent symptoms. For details visit [email or web address].   Also download the MyChart app! Go to the app store, search "MyChart", open the app, select [facility], and log in with your MyChart username and password.  Due to Covid, a mask is required upon entering the hospital/clinic. If you do not have a mask, one will be given to you upon arrival. For doctor visits, patients may have 1 support person aged 43 or older with them. For treatment visits, patients cannot have anyone with them due to current Covid guidelines and our immunocompromised population.   Azacitidine suspension for injection (subcutaneous use) What is this medication? AZACITIDINE (ay [place]) is a chemotherapy drug. This medicine reduces the growth of cancer cells and can suppress the immune system. It is used for treating myelodysplastic syndrome or some types of leukemia. This  medicine may be used for other purposes; ask your health care provider or pharmacist if you have questions. COMMON BRAND NAME(S): Vidaza What should I tell my care team before I take this medication? They need to know if you have any of these conditions: kidney disease liver disease liver tumors an  unusual or allergic reaction to azacitidine, mannitol, other medicines, foods, dyes, or preservatives pregnant or trying to get pregnant breast-feeding How should I use this medication? This medicine is for injection under the skin. It is administered in a hospital or clinic by a specially trained health care professional. Talk to your pediatrician regarding the use of this medicine in children. While this drug may be prescribed for selected conditions, precautions do apply. Overdosage: If you think you have taken too much of this medicine contact a poison control center or emergency room at once. NOTE: This medicine is only for you. Do not share this medicine with others. What if I miss a dose? It is important not to miss your dose. Call your doctor or health care professional if you are unable to keep an appointment. What may interact with this medication? Interactions have not been studied. Give your health care provider a list of all the medicines, herbs, non-prescription drugs, or dietary supplements you use. Also tell them if you smoke, drink alcohol, or use illegal drugs. Some items may interact with your medicine. This list may not describe all possible interactions. Give your health care provider a list of all the medicines, herbs, non-prescription drugs, or dietary supplements you use. Also tell them if you smoke, drink alcohol, or use illegal drugs. Some items may interact with your medicine. What should I watch for while using this medication? Visit your doctor for checks on your progress. This drug may make you feel generally unwell. This is not uncommon, as chemotherapy can affect healthy cells as well as cancer cells. Report any side effects. Continue your course of treatment even though you feel ill unless your doctor tells you to stop. In some cases, you may be given additional medicines to help with side effects. Follow all directions for their use. Call your doctor or health care  professional for advice if you get a fever, chills or sore throat, or other symptoms of a cold or flu. Do not treat yourself. This drug decreases your body's ability to fight infections. Try to avoid being around people who are sick. This medicine may increase your risk to bruise or bleed. Call your doctor or health care professional if you notice any unusual bleeding. You may need blood work done while you are taking this medicine. Do not become pregnant while taking this medicine and for 6 months after the last dose. Women should inform their doctor if they wish to become pregnant or think they might be pregnant. Men should not father a child while taking this medicine and for 3 months after the last dose. There is a potential for serious side effects to an unborn child. Talk to your health care professional or pharmacist for more information. Do not breast-feed an infant while taking this medicine and for 1 week after the last dose. This medicine may interfere with the ability to have a child. Talk with your doctor or health care professional if you are concerned about your fertility. What side effects may I notice from receiving this medication? Side effects that you should report to your doctor or health care professional as soon as possible: allergic reactions like skin rash,  itching or hives, swelling of the face, lips, or tongue low blood counts - this medicine may decrease the number of white blood cells, red blood cells and platelets. You may be at increased risk for infections and bleeding. signs of infection - fever or chills, cough, sore throat, pain passing urine signs of decreased platelets or bleeding - bruising, pinpoint red spots on the skin, black, tarry stools, blood in the urine signs of decreased red blood cells - unusually weak or tired, fainting spells, lightheadedness signs and symptoms of kidney injury like trouble passing urine or change in the amount of urine signs and  symptoms of liver injury like dark yellow or brown urine; general ill feeling or flu-like symptoms; light-colored stools; loss of appetite; nausea; right upper belly pain; unusually weak or tired; yellowing of the eyes or skin Side effects that usually do not require medical attention (report to your doctor or health care professional if they continue or are bothersome): constipation diarrhea nausea, vomiting pain or redness at the injection site unusually weak or tired This list may not describe all possible side effects. Call your doctor for medical advice about side effects. You may report side effects to FDA at 1-800-FDA-1088. Where should I keep my medication? This drug is given in a hospital or clinic and will not be stored at home. NOTE: This sheet is a summary. It may not cover all possible information. If you have questions about this medicine, talk to your doctor, pharmacist, or health care provider.  2022 Elsevier/Gold Standard (2016-06-17 00:00:00)

## 2021-06-16 NOTE — Progress Notes (Signed)
Patient presents for treatment. RN assessment completed along with the following:  Labs/vitals reviewed - Yes, and okay to treat with current abnormal labs per Dr. Benay Spice    Weight within 10% of previous measurement - Yes Informed consent completed and reflects current therapy/intent - Yes, on date 06/16/2021             Provider progress note reviewed - Patient not seen by provider today. Most recent note dated 06/13/2021 reviewed. Treatment/Antibody/Supportive plan reviewed - Yes, and there are no adjustments needed for today's treatment. S&H and other orders reviewed - Yes, and there are no additional orders identified. Previous treatment date reviewed - Yes, and the appropriate amount of time has elapsed between treatments. Clinic Hand Off Received from - No  Patient to proceed with treatment.

## 2021-06-17 ENCOUNTER — Inpatient Hospital Stay: Payer: Medicare Other

## 2021-06-17 ENCOUNTER — Telehealth: Payer: Self-pay

## 2021-06-17 VITALS — BP 105/70 | HR 100 | Temp 98.0°F | Resp 20

## 2021-06-17 DIAGNOSIS — C9200 Acute myeloblastic leukemia, not having achieved remission: Secondary | ICD-10-CM

## 2021-06-17 DIAGNOSIS — D696 Thrombocytopenia, unspecified: Secondary | ICD-10-CM

## 2021-06-17 MED ORDER — ONDANSETRON HCL 8 MG PO TABS
8.0000 mg | ORAL_TABLET | Freq: Once | ORAL | Status: AC
  Administered 2021-06-17: 8 mg via ORAL
  Filled 2021-06-17: qty 1

## 2021-06-17 MED ORDER — AZACITIDINE CHEMO SQ INJECTION
50.0000 mg/m2 | Freq: Once | INTRAMUSCULAR | Status: AC
  Administered 2021-06-17: 92.5 mg via SUBCUTANEOUS
  Filled 2021-06-17: qty 3.7

## 2021-06-17 NOTE — Progress Notes (Signed)
Patient presents for D2C1 of Vidaza injection. Reports feelings well and denies any concerns or side effects from D1C1. Denies nausea or vomiting. Knows to call the clinic with any concerns.

## 2021-06-17 NOTE — Progress Notes (Signed)
Patient presents for treatment. RN assessment completed along with the following:   Labs/vitals reviewed - Yes, and okay to treat with current abnormal labs per Dr. Benay Spice    Weight within 10% of previous measurement - Yes Informed consent completed and reflects current therapy/intent - Yes, on date 06/16/2021             Provider progress note reviewed - Patient not seen by provider today. Most recent note dated 06/13/2021 reviewed. Treatment/Antibody/Supportive plan reviewed - Yes, and there are no adjustments needed for today's treatment. S&H and other orders reviewed - Yes, and there are no additional orders identified. Previous treatment date reviewed - Yes, and the appropriate amount of time has elapsed between treatments. Clinic Hand Off Received from - No   Patient to proceed with treatment.

## 2021-06-17 NOTE — Telephone Encounter (Signed)
Called and spoke with the patient regarding his voicemail. Patient want to go over his appointment time for Wednesday and Thursday. Patient was inform of his time. Patient voiced understanding had no further questions or concern

## 2021-06-17 NOTE — Patient Instructions (Signed)
[facility]   Discharge Instructions: Thank you for choosing [facility] to provide your oncology and hematology care.   If you have a lab appointment with the [facility], please go directly to the [facility] and check in at the registration area.   Wear comfortable clothing and clothing appropriate for easy access to any Portacath or PICC line.   We strive to give you quality time with your provider. You may need to reschedule your appointment if you arrive late (15 or more minutes).  Arriving late affects you and other patients whose appointments are after yours.  Also, if you miss three or more appointments without notifying the office, you may be dismissed from the clinic at the providers discretion.      For prescription refill requests, have your pharmacy contact our office and allow 72 hours for refills to be completed.    Today you received the following chemotherapy and/or immunotherapy agents Azacitidine (VIDAZA).      To help prevent nausea and vomiting after your treatment, we encourage you to take your nausea medication as directed.  BELOW ARE SYMPTOMS THAT SHOULD BE REPORTED IMMEDIATELY: *FEVER GREATER THAN 100.4 F (38 C) OR HIGHER *CHILLS OR SWEATING *NAUSEA AND VOMITING THAT IS NOT CONTROLLED WITH YOUR NAUSEA MEDICATION *UNUSUAL SHORTNESS OF BREATH *UNUSUAL BRUISING OR BLEEDING *URINARY PROBLEMS (pain or burning when urinating, or frequent urination) *BOWEL PROBLEMS (unusual diarrhea, constipation, pain near the anus) TENDERNESS IN MOUTH AND THROAT WITH OR WITHOUT PRESENCE OF ULCERS (sore throat, sores in mouth, or a toothache) UNUSUAL RASH, SWELLING OR PAIN  UNUSUAL VAGINAL DISCHARGE OR ITCHING   Items with * indicate a potential emergency and should be followed up as soon as possible or go to the Emergency Department if any problems should occur.  Please show the CHEMOTHERAPY ALERT CARD or IMMUNOTHERAPY ALERT CARD at  check-in to the Emergency Department and triage nurse.  Should you have questions after your visit or need to cancel or reschedule your appointment, please contact [facility]  Dept: [phone]  and follow the prompts.  Office hours are 8:00 a.m. to 4:30 p.m. Monday - Friday. Please note that voicemails left after 4:00 p.m. may not be returned until the following business day.  We are closed weekends and major holidays. You have access to a nurse at all times for urgent questions. Please call the main number to the clinic Dept: [phone] and follow the prompts.   For any non-urgent questions, you may also contact your provider using MyChart. We now offer e-Visits for anyone 47 and older to request care online for non-urgent symptoms. For details visit [email or web address].   Also download the MyChart app! Go to the app store, search "MyChart", open the app, select , and log in with your MyChart username and password.  Due to Covid, a mask is required upon entering the hospital/clinic. If you do not have a mask, one will be given to you upon arrival. For doctor visits, patients may have 1 support person aged 53 or older with them. For treatment visits, patients cannot have anyone with them due to current Covid guidelines and our immunocompromised population.   Azacitidine suspension for injection (subcutaneous use) What is this medication? AZACITIDINE (ay [place]) is a chemotherapy drug. This medicine reduces the growth of cancer cells and can suppress the immune system. It is used for treating myelodysplastic syndrome or some types of leukemia. This  medicine may be used for other purposes; ask your health care provider or pharmacist if you have questions. COMMON BRAND NAME(S): Vidaza What should I tell my care team before I take this medication? They need to know if you have any of these conditions: kidney disease liver disease liver tumors an  unusual or allergic reaction to azacitidine, mannitol, other medicines, foods, dyes, or preservatives pregnant or trying to get pregnant breast-feeding How should I use this medication? This medicine is for injection under the skin. It is administered in a hospital or clinic by a specially trained health care professional. Talk to your pediatrician regarding the use of this medicine in children. While this drug may be prescribed for selected conditions, precautions do apply. Overdosage: If you think you have taken too much of this medicine contact a poison control center or emergency room at once. NOTE: This medicine is only for you. Do not share this medicine with others. What if I miss a dose? It is important not to miss your dose. Call your doctor or health care professional if you are unable to keep an appointment. What may interact with this medication? Interactions have not been studied. Give your health care provider a list of all the medicines, herbs, non-prescription drugs, or dietary supplements you use. Also tell them if you smoke, drink alcohol, or use illegal drugs. Some items may interact with your medicine. This list may not describe all possible interactions. Give your health care provider a list of all the medicines, herbs, non-prescription drugs, or dietary supplements you use. Also tell them if you smoke, drink alcohol, or use illegal drugs. Some items may interact with your medicine. What should I watch for while using this medication? Visit your doctor for checks on your progress. This drug may make you feel generally unwell. This is not uncommon, as chemotherapy can affect healthy cells as well as cancer cells. Report any side effects. Continue your course of treatment even though you feel ill unless your doctor tells you to stop. In some cases, you may be given additional medicines to help with side effects. Follow all directions for their use. Call your doctor or health care  professional for advice if you get a fever, chills or sore throat, or other symptoms of a cold or flu. Do not treat yourself. This drug decreases your body's ability to fight infections. Try to avoid being around people who are sick. This medicine may increase your risk to bruise or bleed. Call your doctor or health care professional if you notice any unusual bleeding. You may need blood work done while you are taking this medicine. Do not become pregnant while taking this medicine and for 6 months after the last dose. Women should inform their doctor if they wish to become pregnant or think they might be pregnant. Men should not father a child while taking this medicine and for 3 months after the last dose. There is a potential for serious side effects to an unborn child. Talk to your health care professional or pharmacist for more information. Do not breast-feed an infant while taking this medicine and for 1 week after the last dose. This medicine may interfere with the ability to have a child. Talk with your doctor or health care professional if you are concerned about your fertility. What side effects may I notice from receiving this medication? Side effects that you should report to your doctor or health care professional as soon as possible: allergic reactions like skin rash,  itching or hives, swelling of the face, lips, or tongue low blood counts - this medicine may decrease the number of white blood cells, red blood cells and platelets. You may be at increased risk for infections and bleeding. signs of infection - fever or chills, cough, sore throat, pain passing urine signs of decreased platelets or bleeding - bruising, pinpoint red spots on the skin, black, tarry stools, blood in the urine signs of decreased red blood cells - unusually weak or tired, fainting spells, lightheadedness signs and symptoms of kidney injury like trouble passing urine or change in the amount of urine signs and  symptoms of liver injury like dark yellow or brown urine; general ill feeling or flu-like symptoms; light-colored stools; loss of appetite; nausea; right upper belly pain; unusually weak or tired; yellowing of the eyes or skin Side effects that usually do not require medical attention (report to your doctor or health care professional if they continue or are bothersome): constipation diarrhea nausea, vomiting pain or redness at the injection site unusually weak or tired This list may not describe all possible side effects. Call your doctor for medical advice about side effects. You may report side effects to FDA at 1-800-FDA-1088. Where should I keep my medication? This drug is given in a hospital or clinic and will not be stored at home. NOTE: This sheet is a summary. It may not cover all possible information. If you have questions about this medicine, talk to your doctor, pharmacist, or health care provider.  2022 Elsevier/Gold Standard (2016-06-17 00:00:00)

## 2021-06-18 ENCOUNTER — Encounter (HOSPITAL_COMMUNITY): Payer: Self-pay | Admitting: Family Medicine

## 2021-06-18 ENCOUNTER — Inpatient Hospital Stay: Payer: Medicare Other

## 2021-06-18 ENCOUNTER — Other Ambulatory Visit: Payer: Self-pay

## 2021-06-18 VITALS — BP 113/64 | HR 78 | Temp 97.8°F | Resp 20

## 2021-06-18 DIAGNOSIS — C9200 Acute myeloblastic leukemia, not having achieved remission: Secondary | ICD-10-CM | POA: Diagnosis not present

## 2021-06-18 DIAGNOSIS — D696 Thrombocytopenia, unspecified: Secondary | ICD-10-CM

## 2021-06-18 MED ORDER — ONDANSETRON HCL 8 MG PO TABS
8.0000 mg | ORAL_TABLET | Freq: Once | ORAL | Status: AC
  Administered 2021-06-18: 8 mg via ORAL
  Filled 2021-06-18: qty 1

## 2021-06-18 MED ORDER — AZACITIDINE CHEMO SQ INJECTION
50.0000 mg/m2 | Freq: Once | INTRAMUSCULAR | Status: AC
  Administered 2021-06-18: 92.5 mg via SUBCUTANEOUS
  Filled 2021-06-18: qty 3.7

## 2021-06-18 NOTE — Progress Notes (Signed)
Patient presents for treatment. RN assessment completed along with the following:  Labs/vitals reviewed - Yes, and Ok per Dr Benay Spice to treat with current abnormal labs    Weight within 10% of previous measurement - Yes Informed consent completed and reflects current therapy/intent - Yes, on date 06/16/21             Provider progress note reviewed - Patient not seen by provider today. Most recent note dated 06/13/21 reviewed. Treatment/Antibody/Supportive plan reviewed - Yes, and there are no adjustments needed for today's treatment. S&H and other orders reviewed - Yes, and there are no additional orders identified. Previous treatment date reviewed - Yes, and the appropriate amount of time has elapsed between treatments. Clinic Hand Off Received from - none  Patient to proceed with treatment.

## 2021-06-18 NOTE — Patient Instructions (Signed)
[name]   Discharge Instructions: Thank you for choosing [facility] to provide your oncology and hematology care.   If you have a lab appointment with the [facility], please go directly to the [facility] and check in at the registration area.   Wear comfortable clothing and clothing appropriate for easy access to any Portacath or PICC line.   We strive to give you quality time with your provider. You may need to reschedule your appointment if you arrive late (15 or more minutes).  Arriving late affects you and other patients whose appointments are after yours.  Also, if you miss three or more appointments without notifying the office, you may be dismissed from the clinic at the providers discretion.      For prescription refill requests, have your pharmacy contact our office and allow 72 hours for refills to be completed.    Today you received the following chemotherapy and/or immunotherapy agents AzacitidineAzacitidine suspension for injection (subcutaneous use) What is this medication? AZACITIDINE (ay [place]) is a chemotherapy drug. This medicine reduces the growth of cancer cells and can suppress the immune system. It is used for treating myelodysplastic syndrome or some types of leukemia. This medicine may be used for other purposes; ask your health care provider or pharmacist if you have questions. COMMON BRAND NAME(S): Vidaza What should I tell my care team before I take this medication? They need to know if you have any of these conditions: kidney disease liver disease liver tumors an unusual or allergic reaction to azacitidine, mannitol, other medicines, foods, dyes, or preservatives pregnant or trying to get pregnant breast-feeding How should I use this medication? This medicine is for injection under the skin. It is administered in a hospital or clinic by a specially trained health care professional. Talk to your  pediatrician regarding the use of this medicine in children. While this drug may be prescribed for selected conditions, precautions do apply. Overdosage: If you think you have taken too much of this medicine contact a poison control center or emergency room at once. NOTE: This medicine is only for you. Do not share this medicine with others. What if I miss a dose? It is important not to miss your dose. Call your doctor or health care professional if you are unable to keep an appointment. What may interact with this medication? Interactions have not been studied. Give your health care provider a list of all the medicines, herbs, non-prescription drugs, or dietary supplements you use. Also tell them if you smoke, drink alcohol, or use illegal drugs. Some items may interact with your medicine. This list may not describe all possible interactions. Give your health care provider a list of all the medicines, herbs, non-prescription drugs, or dietary supplements you use. Also tell them if you smoke, drink alcohol, or use illegal drugs. Some items may interact with your medicine. What should I watch for while using this medication? Visit your doctor for checks on your progress. This drug may make you feel generally unwell. This is not uncommon, as chemotherapy can affect healthy cells as well as cancer cells. Report any side effects. Continue your course of treatment even though you feel ill unless your doctor tells you to stop. In some cases, you may be given additional medicines to help with side effects. Follow all directions for their use. Call your doctor or health care professional for advice if you get a fever, chills or sore throat,  or other symptoms of a cold or flu. Do not treat yourself. This drug decreases your body's ability to fight infections. Try to avoid being around people who are sick. This medicine may increase your risk to bruise or bleed. Call your doctor or health care professional if you  notice any unusual bleeding. You may need blood work done while you are taking this medicine. Do not become pregnant while taking this medicine and for 6 months after the last dose. Women should inform their doctor if they wish to become pregnant or think they might be pregnant. Men should not father a child while taking this medicine and for 3 months after the last dose. There is a potential for serious side effects to an unborn child. Talk to your health care professional or pharmacist for more information. Do not breast-feed an infant while taking this medicine and for 1 week after the last dose. This medicine may interfere with the ability to have a child. Talk with your doctor or health care professional if you are concerned about your fertility. What side effects may I notice from receiving this medication? Side effects that you should report to your doctor or health care professional as soon as possible: allergic reactions like skin rash, itching or hives, swelling of the face, lips, or tongue low blood counts - this medicine may decrease the number of white blood cells, red blood cells and platelets. You may be at increased risk for infections and bleeding. signs of infection - fever or chills, cough, sore throat, pain passing urine signs of decreased platelets or bleeding - bruising, pinpoint red spots on the skin, black, tarry stools, blood in the urine signs of decreased red blood cells - unusually weak or tired, fainting spells, lightheadedness signs and symptoms of kidney injury like trouble passing urine or change in the amount of urine signs and symptoms of liver injury like dark yellow or brown urine; general ill feeling or flu-like symptoms; light-colored stools; loss of appetite; nausea; right upper belly pain; unusually weak or tired; yellowing of the eyes or skin Side effects that usually do not require medical attention (report to your doctor or health care professional if they  continue or are bothersome): constipation diarrhea nausea, vomiting pain or redness at the injection site unusually weak or tired This list may not describe all possible side effects. Call your doctor for medical advice about side effects. You may report side effects to FDA at 1-800-FDA-1088. Where should I keep my medication? This drug is given in a hospital or clinic and will not be stored at home. NOTE: This sheet is a summary. It may not cover all possible information. If you have questions about this medicine, talk to your doctor, pharmacist, or health care provider.  2022 Elsevier/Gold Standard (2016-06-17 00:00:00)       To help prevent nausea and vomiting after your treatment, we encourage you to take your nausea medication as directed.  BELOW ARE SYMPTOMS THAT SHOULD BE REPORTED IMMEDIATELY: *FEVER GREATER THAN 100.4 F (38 C) OR HIGHER *CHILLS OR SWEATING *NAUSEA AND VOMITING THAT IS NOT CONTROLLED WITH YOUR NAUSEA MEDICATION *UNUSUAL SHORTNESS OF BREATH *UNUSUAL BRUISING OR BLEEDING *URINARY PROBLEMS (pain or burning when urinating, or frequent urination) *BOWEL PROBLEMS (unusual diarrhea, constipation, pain near the anus) TENDERNESS IN MOUTH AND THROAT WITH OR WITHOUT PRESENCE OF ULCERS (sore throat, sores in mouth, or a toothache) UNUSUAL RASH, SWELLING OR PAIN  UNUSUAL VAGINAL DISCHARGE OR ITCHING   Items with * indicate  a potential emergency and should be followed up as soon as possible or go to the Emergency Department if any problems should occur.  Please show the CHEMOTHERAPY ALERT CARD or IMMUNOTHERAPY ALERT CARD at check-in to the Emergency Department and triage nurse.  Should you have questions after your visit or need to cancel or reschedule your appointment, please contact [facility]  Dept: [phone]  and follow the prompts.  Office hours are 8:00 a.m. to 4:30 p.m. Monday - Friday. Please note that voicemails left after 4:00 p.m.  may not be returned until the following business day.  We are closed weekends and major holidays. You have access to a nurse at all times for urgent questions. Please call the main number to the clinic Dept: [phone] and follow the prompts.   For any non-urgent questions, you may also contact your provider using MyChart. We now offer e-Visits for anyone 15 and older to request care online for non-urgent symptoms. For details visit [email or web address].   Also download the MyChart app! Go to the app store, search "MyChart", open the app, select [facility], and log in with your MyChart username and password.  Due to Covid, a mask is required upon entering the hospital/clinic. If you do not have a mask, one will be given to you upon arrival. For doctor visits, patients may have 1 support person aged 38 or older with them. For treatment visits, patients cannot have anyone with them due to current Covid guidelines and our immunocompromised population.

## 2021-06-19 ENCOUNTER — Ambulatory Visit (HOSPITAL_COMMUNITY): Admission: RE | Admit: 2021-06-19 | Payer: Commercial Managed Care - HMO | Source: Ambulatory Visit

## 2021-06-19 ENCOUNTER — Ambulatory Visit (HOSPITAL_COMMUNITY): Payer: Commercial Managed Care - HMO

## 2021-06-19 ENCOUNTER — Inpatient Hospital Stay: Payer: Medicare Other

## 2021-06-19 VITALS — BP 130/87 | HR 100 | Temp 98.0°F | Resp 20 | Wt 152.5 lb

## 2021-06-19 DIAGNOSIS — D696 Thrombocytopenia, unspecified: Secondary | ICD-10-CM

## 2021-06-19 DIAGNOSIS — C9200 Acute myeloblastic leukemia, not having achieved remission: Secondary | ICD-10-CM

## 2021-06-19 LAB — CBC WITH DIFFERENTIAL (CANCER CENTER ONLY)
Abs Immature Granulocytes: 0.01 10*3/uL (ref 0.00–0.07)
Basophils Absolute: 0 10*3/uL (ref 0.0–0.1)
Basophils Relative: 1 %
Eosinophils Absolute: 0 10*3/uL (ref 0.0–0.5)
Eosinophils Relative: 1 %
HCT: 32.1 % — ABNORMAL LOW (ref 39.0–52.0)
Hemoglobin: 10.4 g/dL — ABNORMAL LOW (ref 13.0–17.0)
Immature Granulocytes: 1 %
Lymphocytes Relative: 40 %
Lymphs Abs: 0.8 10*3/uL (ref 0.7–4.0)
MCH: 27.2 pg (ref 26.0–34.0)
MCHC: 32.4 g/dL (ref 30.0–36.0)
MCV: 83.8 fL (ref 80.0–100.0)
Monocytes Absolute: 0.1 10*3/uL (ref 0.1–1.0)
Monocytes Relative: 6 %
Neutro Abs: 1 10*3/uL — ABNORMAL LOW (ref 1.7–7.7)
Neutrophils Relative %: 51 %
Platelet Count: 16 10*3/uL — ABNORMAL LOW (ref 150–400)
RBC: 3.83 MIL/uL — ABNORMAL LOW (ref 4.22–5.81)
RDW: 16.1 % — ABNORMAL HIGH (ref 11.5–15.5)
WBC Count: 1.9 10*3/uL — ABNORMAL LOW (ref 4.0–10.5)
nRBC: 0 % (ref 0.0–0.2)

## 2021-06-19 LAB — SAMPLE TO BLOOD BANK

## 2021-06-19 MED ORDER — AZACITIDINE CHEMO SQ INJECTION
50.0000 mg/m2 | Freq: Once | INTRAMUSCULAR | Status: AC
  Administered 2021-06-19: 92.5 mg via SUBCUTANEOUS
  Filled 2021-06-19: qty 3.7

## 2021-06-19 MED ORDER — ONDANSETRON HCL 8 MG PO TABS
8.0000 mg | ORAL_TABLET | Freq: Once | ORAL | Status: AC
  Administered 2021-06-19: 8 mg via ORAL
  Filled 2021-06-19: qty 1

## 2021-06-19 NOTE — Patient Instructions (Signed)
[facility]   Discharge Instructions: Thank you for choosing [facility] to provide your oncology and hematology care.   If you have a lab appointment with the [facility], please go directly to the [facility] and check in at the registration area.   Wear comfortable clothing and clothing appropriate for easy access to any Portacath or PICC line.   We strive to give you quality time with your provider. You may need to reschedule your appointment if you arrive late (15 or more minutes).  Arriving late affects you and other patients whose appointments are after yours.  Also, if you miss three or more appointments without notifying the office, you may be dismissed from the clinic at the providers discretion.      For prescription refill requests, have your pharmacy contact our office and allow 72 hours for refills to be completed.    Today you received the following chemotherapy and/or immunotherapy agents Azacitidine  Azacitidine suspension for injection (subcutaneous use) What is this medication? AZACITIDINE (ay [place]) is a chemotherapy drug. This medicine reduces the growth of cancer cells and can suppress the immune system. It is used for treating myelodysplastic syndrome or some types of leukemia. This medicine may be used for other purposes; ask your health care provider or pharmacist if you have questions. COMMON BRAND NAME(S): Vidaza What should I tell my care team before I take this medication? They need to know if you have any of these conditions: kidney disease liver disease liver tumors an unusual or allergic reaction to azacitidine, mannitol, other medicines, foods, dyes, or preservatives pregnant or trying to get pregnant breast-feeding How should I use this medication? This medicine is for injection under the skin. It is administered in a hospital or clinic by a specially trained health care professional. Talk to your  pediatrician regarding the use of this medicine in children. While this drug may be prescribed for selected conditions, precautions do apply. Overdosage: If you think you have taken too much of this medicine contact a poison control center or emergency room at once. NOTE: This medicine is only for you. Do not share this medicine with others. What if I miss a dose? It is important not to miss your dose. Call your doctor or health care professional if you are unable to keep an appointment. What may interact with this medication? Interactions have not been studied. Give your health care provider a list of all the medicines, herbs, non-prescription drugs, or dietary supplements you use. Also tell them if you smoke, drink alcohol, or use illegal drugs. Some items may interact with your medicine. This list may not describe all possible interactions. Give your health care provider a list of all the medicines, herbs, non-prescription drugs, or dietary supplements you use. Also tell them if you smoke, drink alcohol, or use illegal drugs. Some items may interact with your medicine. What should I watch for while using this medication? Visit your doctor for checks on your progress. This drug may make you feel generally unwell. This is not uncommon, as chemotherapy can affect healthy cells as well as cancer cells. Report any side effects. Continue your course of treatment even though you feel ill unless your doctor tells you to stop. In some cases, you may be given additional medicines to help with side effects. Follow all directions for their use. Call your doctor or health care professional for advice if you get a fever, chills or  sore throat, or other symptoms of a cold or flu. Do not treat yourself. This drug decreases your body's ability to fight infections. Try to avoid being around people who are sick. This medicine may increase your risk to bruise or bleed. Call your doctor or health care professional if you  notice any unusual bleeding. You may need blood work done while you are taking this medicine. Do not become pregnant while taking this medicine and for 6 months after the last dose. Women should inform their doctor if they wish to become pregnant or think they might be pregnant. Men should not father a child while taking this medicine and for 3 months after the last dose. There is a potential for serious side effects to an unborn child. Talk to your health care professional or pharmacist for more information. Do not breast-feed an infant while taking this medicine and for 1 week after the last dose. This medicine may interfere with the ability to have a child. Talk with your doctor or health care professional if you are concerned about your fertility. What side effects may I notice from receiving this medication? Side effects that you should report to your doctor or health care professional as soon as possible: allergic reactions like skin rash, itching or hives, swelling of the face, lips, or tongue low blood counts - this medicine may decrease the number of white blood cells, red blood cells and platelets. You may be at increased risk for infections and bleeding. signs of infection - fever or chills, cough, sore throat, pain passing urine signs of decreased platelets or bleeding - bruising, pinpoint red spots on the skin, black, tarry stools, blood in the urine signs of decreased red blood cells - unusually weak or tired, fainting spells, lightheadedness signs and symptoms of kidney injury like trouble passing urine or change in the amount of urine signs and symptoms of liver injury like dark yellow or brown urine; general ill feeling or flu-like symptoms; light-colored stools; loss of appetite; nausea; right upper belly pain; unusually weak or tired; yellowing of the eyes or skin Side effects that usually do not require medical attention (report to your doctor or health care professional if they  continue or are bothersome): constipation diarrhea nausea, vomiting pain or redness at the injection site unusually weak or tired This list may not describe all possible side effects. Call your doctor for medical advice about side effects. You may report side effects to FDA at 1-800-FDA-1088. Where should I keep my medication? This drug is given in a hospital or clinic and will not be stored at home. NOTE: This sheet is a summary. It may not cover all possible information. If you have questions about this medicine, talk to your doctor, pharmacist, or health care provider.  2022 Elsevier/Gold Standard (2016-06-17 00:00:00)       To help prevent nausea and vomiting after your treatment, we encourage you to take your nausea medication as directed.  BELOW ARE SYMPTOMS THAT SHOULD BE REPORTED IMMEDIATELY: *FEVER GREATER THAN 100.4 F (38 C) OR HIGHER *CHILLS OR SWEATING *NAUSEA AND VOMITING THAT IS NOT CONTROLLED WITH YOUR NAUSEA MEDICATION *UNUSUAL SHORTNESS OF BREATH *UNUSUAL BRUISING OR BLEEDING *URINARY PROBLEMS (pain or burning when urinating, or frequent urination) *BOWEL PROBLEMS (unusual diarrhea, constipation, pain near the anus) TENDERNESS IN MOUTH AND THROAT WITH OR WITHOUT PRESENCE OF ULCERS (sore throat, sores in mouth, or a toothache) UNUSUAL RASH, SWELLING OR PAIN  UNUSUAL VAGINAL DISCHARGE OR ITCHING   Items with *  indicate a potential emergency and should be followed up as soon as possible or go to the Emergency Department if any problems should occur.  Please show the CHEMOTHERAPY ALERT CARD or IMMUNOTHERAPY ALERT CARD at check-in to the Emergency Department and triage nurse.  Should you have questions after your visit or need to cancel or reschedule your appointment, please contact [facility]  Dept: [phone]  and follow the prompts.  Office hours are 8:00 a.m. to 4:30 p.m. Monday - Friday. Please note that voicemails left after 4:00 p.m.  may not be returned until the following business day.  We are closed weekends and major holidays. You have access to a nurse at all times for urgent questions. Please call the main number to the clinic Dept: [phone] and follow the prompts.   For any non-urgent questions, you may also contact your provider using MyChart. We now offer e-Visits for anyone 28 and older to request care online for non-urgent symptoms. For details visit [email or web address].   Also download the MyChart app! Go to the app store, search "MyChart", open the app, select [facility], and log in with your MyChart username and password.  Due to Covid, a mask is required upon entering the hospital/clinic. If you do not have a mask, one will be given to you upon arrival. For doctor visits, patients may have 1 support person aged 78 or older with them. For treatment visits, patients cannot have anyone with them due to current Covid guidelines and our immunocompromised population.

## 2021-06-19 NOTE — Progress Notes (Signed)
Patient presents for treatment. RN assessment completed along with the following:  Labs/vitals reviewed - Yes and per Dr. Benay Spice, ok to treat with platelets 16.  Weight within 10% of previous measurement - Yes Informed consent completed and reflects current therapy/intent - Yes, on date 06/16/21             Provider progress note reviewed - Patient not seen by provider today. Most recent note dated 06/13/21 reviewed. Treatment/Antibody/Supportive plan reviewed - Yes, and there are no adjustments needed for today's treatment. S&H and other orders reviewed - Yes, and there are no additional orders identified. Previous treatment date reviewed - Yes, and the appropriate amount of time has elapsed between treatments.  Patient to proceed with treatment.

## 2021-06-20 ENCOUNTER — Encounter: Payer: Self-pay | Admitting: Oncology

## 2021-06-20 ENCOUNTER — Inpatient Hospital Stay: Payer: Medicare Other

## 2021-06-20 ENCOUNTER — Other Ambulatory Visit: Payer: Self-pay

## 2021-06-20 ENCOUNTER — Encounter: Payer: Self-pay | Admitting: *Deleted

## 2021-06-20 VITALS — BP 122/73 | HR 100 | Resp 20

## 2021-06-20 DIAGNOSIS — C9200 Acute myeloblastic leukemia, not having achieved remission: Secondary | ICD-10-CM | POA: Diagnosis not present

## 2021-06-20 DIAGNOSIS — D696 Thrombocytopenia, unspecified: Secondary | ICD-10-CM

## 2021-06-20 MED ORDER — ONDANSETRON HCL 8 MG PO TABS
8.0000 mg | ORAL_TABLET | Freq: Once | ORAL | Status: AC
  Administered 2021-06-20: 8 mg via ORAL
  Filled 2021-06-20: qty 1

## 2021-06-20 MED ORDER — AZACITIDINE CHEMO SQ INJECTION
50.0000 mg/m2 | Freq: Once | INTRAMUSCULAR | Status: AC
  Administered 2021-06-20: 92.5 mg via SUBCUTANEOUS
  Filled 2021-06-20: qty 3.7

## 2021-06-20 NOTE — Progress Notes (Signed)
Patient presents for treatment. RN assessment completed along with the following:  Labs/vitals reviewed - Yes, and per Dr Benay Spice, ok to treat with platelets 16    Weight within 10% of previous measurement - Yes Informed consent completed and reflects current therapy/intent - Yes, on date 06/16/21             Provider progress note reviewed - Patient not seen by provider today. Most recent note dated 06/13/21 reviewed. Treatment/Antibody/Supportive plan reviewed - Yes, and there are no adjustments needed for today's treatment. S&H and other orders reviewed - Yes, and there are no additional orders identified. Previous treatment date reviewed - Yes, and the appropriate amount of time has elapsed between treatments. Clinic Hand Off Received from - none   Patient to proceed with treatment.

## 2021-06-20 NOTE — Progress Notes (Signed)
Called [facility] at [phone] on 1/19 and again this morning to request p2p with Dr. Benay Spice for 06/20/21 re: Ziextenzo that is due on 06/21/21. Was informed on 1/19 that "Mickel Baas" will call today. Representative today will sent another email today to medical director team to follow up. Was informed the case was turned over to [employer]. Provided direct # for return call.

## 2021-06-20 NOTE — Patient Instructions (Signed)
[facility]   Discharge Instructions: Thank you for choosing [facility] to provide your oncology and hematology care.   If you have a lab appointment with the [facility], please go directly to the [facility] and check in at the registration area.   Wear comfortable clothing and clothing appropriate for easy access to any Portacath or PICC line.   We strive to give you quality time with your provider. You may need to reschedule your appointment if you arrive late (15 or more minutes).  Arriving late affects you and other patients whose appointments are after yours.  Also, if you miss three or more appointments without notifying the office, you may be dismissed from the clinic at the providers discretion.      For prescription refill requests, have your pharmacy contact our office and allow 72 hours for refills to be completed.    Today you received the following chemotherapy and/or immunotherapy agents Vidaza       To help prevent nausea and vomiting after your treatment, we encourage you to take your nausea medication as directed.  BELOW ARE SYMPTOMS THAT SHOULD BE REPORTED IMMEDIATELY: *FEVER GREATER THAN 100.4 F (38 C) OR HIGHER *CHILLS OR SWEATING *NAUSEA AND VOMITING THAT IS NOT CONTROLLED WITH YOUR NAUSEA MEDICATION *UNUSUAL SHORTNESS OF BREATH *UNUSUAL BRUISING OR BLEEDING *URINARY PROBLEMS (pain or burning when urinating, or frequent urination) *BOWEL PROBLEMS (unusual diarrhea, constipation, pain near the anus) TENDERNESS IN MOUTH AND THROAT WITH OR WITHOUT PRESENCE OF ULCERS (sore throat, sores in mouth, or a toothache) UNUSUAL RASH, SWELLING OR PAIN  UNUSUAL VAGINAL DISCHARGE OR ITCHING   Items with * indicate a potential emergency and should be followed up as soon as possible or go to the Emergency Department if any problems should occur.  Please show the CHEMOTHERAPY ALERT CARD or IMMUNOTHERAPY ALERT CARD at check-in to the  Emergency Department and triage nurse.  Should you have questions after your visit or need to cancel or reschedule your appointment, please contact [facility]  Dept: [phone]  and follow the prompts.  Office hours are 8:00 a.m. to 4:30 p.m. Monday - Friday. Please note that voicemails left after 4:00 p.m. may not be returned until the following business day.  We are closed weekends and major holidays. You have access to a nurse at all times for urgent questions. Please call the main number to the clinic Dept: [phone] and follow the prompts.   For any non-urgent questions, you may also contact your provider using MyChart. We now offer e-Visits for anyone 46 and older to request care online for non-urgent symptoms. For details visit [email or web address].   Also download the MyChart app! Go to the app store, search "MyChart", open the app, select [facility], and log in with your MyChart username and password.  Due to Covid, a mask is required upon entering the hospital/clinic. If you do not have a mask, one will be given to you upon arrival. For doctor visits, patients may have 1 support person aged 47 or older with them. For treatment visits, patients cannot have anyone with them due to current Covid guidelines and our immunocompromised population.   Azacitidine suspension for injection (subcutaneous use) What is this medication? AZACITIDINE (ay [place]) is a chemotherapy drug. This medicine reduces the growth of cancer cells and can suppress the immune system. It is used for treating myelodysplastic syndrome or some types of leukemia. This  medicine may be used for other purposes; ask your health care provider or pharmacist if you have questions. COMMON BRAND NAME(S): Vidaza What should I tell my care team before I take this medication? They need to know if you have any of these conditions: kidney disease liver disease liver tumors an unusual or  allergic reaction to azacitidine, mannitol, other medicines, foods, dyes, or preservatives pregnant or trying to get pregnant breast-feeding How should I use this medication? This medicine is for injection under the skin. It is administered in a hospital or clinic by a specially trained health care professional. Talk to your pediatrician regarding the use of this medicine in children. While this drug may be prescribed for selected conditions, precautions do apply. Overdosage: If you think you have taken too much of this medicine contact a poison control center or emergency room at once. NOTE: This medicine is only for you. Do not share this medicine with others. What if I miss a dose? It is important not to miss your dose. Call your doctor or health care professional if you are unable to keep an appointment. What may interact with this medication? Interactions have not been studied. Give your health care provider a list of all the medicines, herbs, non-prescription drugs, or dietary supplements you use. Also tell them if you smoke, drink alcohol, or use illegal drugs. Some items may interact with your medicine. This list may not describe all possible interactions. Give your health care provider a list of all the medicines, herbs, non-prescription drugs, or dietary supplements you use. Also tell them if you smoke, drink alcohol, or use illegal drugs. Some items may interact with your medicine. What should I watch for while using this medication? Visit your doctor for checks on your progress. This drug may make you feel generally unwell. This is not uncommon, as chemotherapy can affect healthy cells as well as cancer cells. Report any side effects. Continue your course of treatment even though you feel ill unless your doctor tells you to stop. In some cases, you may be given additional medicines to help with side effects. Follow all directions for their use. Call your doctor or health care  professional for advice if you get a fever, chills or sore throat, or other symptoms of a cold or flu. Do not treat yourself. This drug decreases your body's ability to fight infections. Try to avoid being around people who are sick. This medicine may increase your risk to bruise or bleed. Call your doctor or health care professional if you notice any unusual bleeding. You may need blood work done while you are taking this medicine. Do not become pregnant while taking this medicine and for 6 months after the last dose. Women should inform their doctor if they wish to become pregnant or think they might be pregnant. Men should not father a child while taking this medicine and for 3 months after the last dose. There is a potential for serious side effects to an unborn child. Talk to your health care professional or pharmacist for more information. Do not breast-feed an infant while taking this medicine and for 1 week after the last dose. This medicine may interfere with the ability to have a child. Talk with your doctor or health care professional if you are concerned about your fertility. What side effects may I notice from receiving this medication? Side effects that you should report to your doctor or health care professional as soon as possible: allergic reactions like skin rash,  itching or hives, swelling of the face, lips, or tongue low blood counts - this medicine may decrease the number of white blood cells, red blood cells and platelets. You may be at increased risk for infections and bleeding. signs of infection - fever or chills, cough, sore throat, pain passing urine signs of decreased platelets or bleeding - bruising, pinpoint red spots on the skin, black, tarry stools, blood in the urine signs of decreased red blood cells - unusually weak or tired, fainting spells, lightheadedness signs and symptoms of kidney injury like trouble passing urine or change in the amount of urine signs and  symptoms of liver injury like dark yellow or brown urine; general ill feeling or flu-like symptoms; light-colored stools; loss of appetite; nausea; right upper belly pain; unusually weak or tired; yellowing of the eyes or skin Side effects that usually do not require medical attention (report to your doctor or health care professional if they continue or are bothersome): constipation diarrhea nausea, vomiting pain or redness at the injection site unusually weak or tired This list may not describe all possible side effects. Call your doctor for medical advice about side effects. You may report side effects to FDA at 1-800-FDA-1088. Where should I keep my medication? This drug is given in a hospital or clinic and will not be stored at home. NOTE: This sheet is a summary. It may not cover all possible information. If you have questions about this medicine, talk to your doctor, pharmacist, or health care provider.  2022 Elsevier/Gold Standard (2016-06-17 00:00:00)

## 2021-06-21 ENCOUNTER — Inpatient Hospital Stay: Payer: Medicare Other

## 2021-06-23 ENCOUNTER — Inpatient Hospital Stay: Payer: Medicare Other

## 2021-06-23 ENCOUNTER — Other Ambulatory Visit: Payer: Self-pay

## 2021-06-23 VITALS — BP 120/92 | HR 98 | Temp 98.7°F | Resp 20

## 2021-06-23 DIAGNOSIS — C9200 Acute myeloblastic leukemia, not having achieved remission: Secondary | ICD-10-CM

## 2021-06-23 DIAGNOSIS — D696 Thrombocytopenia, unspecified: Secondary | ICD-10-CM

## 2021-06-23 LAB — CBC WITH DIFFERENTIAL (CANCER CENTER ONLY)
Abs Immature Granulocytes: 0.01 10*3/uL (ref 0.00–0.07)
Basophils Absolute: 0 10*3/uL (ref 0.0–0.1)
Basophils Relative: 0 %
Eosinophils Absolute: 0 10*3/uL (ref 0.0–0.5)
Eosinophils Relative: 0 %
HCT: 31.9 % — ABNORMAL LOW (ref 39.0–52.0)
Hemoglobin: 10.3 g/dL — ABNORMAL LOW (ref 13.0–17.0)
Immature Granulocytes: 1 %
Lymphocytes Relative: 43 %
Lymphs Abs: 0.7 10*3/uL (ref 0.7–4.0)
MCH: 27.2 pg (ref 26.0–34.0)
MCHC: 32.3 g/dL (ref 30.0–36.0)
MCV: 84.2 fL (ref 80.0–100.0)
Monocytes Absolute: 0.1 10*3/uL (ref 0.1–1.0)
Monocytes Relative: 5 %
Neutro Abs: 0.9 10*3/uL — ABNORMAL LOW (ref 1.7–7.7)
Neutrophils Relative %: 51 %
Platelet Count: 12 10*3/uL — ABNORMAL LOW (ref 150–400)
RBC: 3.79 MIL/uL — ABNORMAL LOW (ref 4.22–5.81)
RDW: 16.1 % — ABNORMAL HIGH (ref 11.5–15.5)
WBC Count: 1.7 10*3/uL — ABNORMAL LOW (ref 4.0–10.5)
nRBC: 0 % (ref 0.0–0.2)

## 2021-06-23 LAB — CMP (CANCER CENTER ONLY)
ALT: 34 U/L (ref 0–44)
AST: 31 U/L (ref 15–41)
Albumin: 4 g/dL (ref 3.5–5.0)
Alkaline Phosphatase: 67 U/L (ref 38–126)
Anion gap: 8 (ref 5–15)
BUN: 30 mg/dL — ABNORMAL HIGH (ref 8–23)
CO2: 29 mmol/L (ref 22–32)
Calcium: 9.6 mg/dL (ref 8.9–10.3)
Chloride: 103 mmol/L (ref 98–111)
Creatinine: 1.14 mg/dL (ref 0.61–1.24)
GFR, Estimated: >60 mL/min (ref >60)
Glucose, Bld: 93 mg/dL (ref 70–99)
Potassium: 4.3 mmol/L (ref 3.5–5.1)
Sodium: 140 mmol/L (ref 135–145)
Total Bilirubin: 1.1 mg/dL (ref 0.3–1.2)
Total Protein: 7.8 g/dL (ref 6.5–8.1)

## 2021-06-23 LAB — SAMPLE TO BLOOD BANK

## 2021-06-23 MED ORDER — PEGFILGRASTIM-BMEZ 6 MG/0.6ML ~~LOC~~ SOSY
6.0000 mg | PREFILLED_SYRINGE | Freq: Once | SUBCUTANEOUS | Status: AC
  Administered 2021-06-23: 6 mg via SUBCUTANEOUS
  Filled 2021-06-23: qty 0.6

## 2021-06-23 NOTE — Patient Instructions (Signed)

## 2021-06-26 ENCOUNTER — Inpatient Hospital Stay (HOSPITAL_BASED_OUTPATIENT_CLINIC_OR_DEPARTMENT_OTHER): Payer: Medicare Other | Admitting: Nurse Practitioner

## 2021-06-26 ENCOUNTER — Encounter: Payer: Self-pay | Admitting: Nurse Practitioner

## 2021-06-26 ENCOUNTER — Inpatient Hospital Stay: Payer: Medicare Other

## 2021-06-26 ENCOUNTER — Other Ambulatory Visit: Payer: Self-pay

## 2021-06-26 VITALS — BP 111/84 | HR 99 | Temp 98.1°F | Resp 20 | Ht 72.0 in | Wt 150.2 lb

## 2021-06-26 DIAGNOSIS — C9200 Acute myeloblastic leukemia, not having achieved remission: Secondary | ICD-10-CM | POA: Diagnosis not present

## 2021-06-26 LAB — CBC WITH DIFFERENTIAL (CANCER CENTER ONLY)
Abs Immature Granulocytes: 0.02 10*3/uL (ref 0.00–0.07)
Basophils Absolute: 0 10*3/uL (ref 0.0–0.1)
Basophils Relative: 0 %
Eosinophils Absolute: 0 10*3/uL (ref 0.0–0.5)
Eosinophils Relative: 0 %
HCT: 32.5 % — ABNORMAL LOW (ref 39.0–52.0)
Hemoglobin: 10.6 g/dL — ABNORMAL LOW (ref 13.0–17.0)
Immature Granulocytes: 0 %
Lymphocytes Relative: 17 %
Lymphs Abs: 0.8 10*3/uL (ref 0.7–4.0)
MCH: 27.8 pg (ref 26.0–34.0)
MCHC: 32.6 g/dL (ref 30.0–36.0)
MCV: 85.3 fL (ref 80.0–100.0)
Monocytes Absolute: 0.5 10*3/uL (ref 0.1–1.0)
Monocytes Relative: 11 %
Neutro Abs: 3.4 10*3/uL (ref 1.7–7.7)
Neutrophils Relative %: 72 %
Platelet Count: 7 10*3/uL — CL (ref 150–400)
RBC: 3.81 MIL/uL — ABNORMAL LOW (ref 4.22–5.81)
RDW: 16.6 % — ABNORMAL HIGH (ref 11.5–15.5)
Smear Review: DECREASED
WBC Count: 4.7 10*3/uL (ref 4.0–10.5)
nRBC: 0 % (ref 0.0–0.2)

## 2021-06-26 LAB — CMP (CANCER CENTER ONLY)
ALT: 25 U/L (ref 0–44)
AST: 19 U/L (ref 15–41)
Albumin: 4 g/dL (ref 3.5–5.0)
Alkaline Phosphatase: 86 U/L (ref 38–126)
Anion gap: 8 (ref 5–15)
BUN: 21 mg/dL (ref 8–23)
CO2: 28 mmol/L (ref 22–32)
Calcium: 9.5 mg/dL (ref 8.9–10.3)
Chloride: 103 mmol/L (ref 98–111)
Creatinine: 1.1 mg/dL (ref 0.61–1.24)
GFR, Estimated: >60 mL/min (ref >60)
Glucose, Bld: 94 mg/dL (ref 70–99)
Potassium: 4.3 mmol/L (ref 3.5–5.1)
Sodium: 139 mmol/L (ref 135–145)
Total Bilirubin: 1.5 mg/dL — ABNORMAL HIGH (ref 0.3–1.2)
Total Protein: 7.6 g/dL (ref 6.5–8.1)

## 2021-06-26 LAB — SAMPLE TO BLOOD BANK

## 2021-06-26 LAB — LACTATE DEHYDROGENASE: LDH: 218 U/L — ABNORMAL HIGH (ref 98–192)

## 2021-06-26 LAB — ABO/RH: ABO/RH(D): AB POS

## 2021-06-26 NOTE — Progress Notes (Signed)
°[facility] °OFFICE PROGRESS NOTE ° ° °Diagnosis: AML ° °INTERVAL HISTORY:  ° °[name] returns as scheduled.  He completed cycle one 5-azacytidine beginning 06/16/2021.  He denies bleeding.  No fever.  No nausea or vomiting.  No diarrhea.  No rash. ° °Objective: ° °Vital signs in last 24 hours: ° °Blood pressure 111/84, pulse 99, temperature 98.1 °F (36.7 °C), temperature source Oral, resp. rate 20, height 6' (1.829 m), weight 150 lb 3.2 oz (68.1 kg), SpO2 97 %. °  ° °HEENT: No thrush or ulcers.  No blood noted in the oral cavity. °Resp: Scattered rhonchi.  No respiratory distress. °Cardio: Regular with occasional premature beat. °GI: Abdomen soft and nontender.  No hepatomegaly.  Left lower quadrant colostomy. °Vascular: No leg edema. °Skin: Resolving ecchymoses right abdominal wall.  Small ecchymoses at the lower legs.  Skin lesions at the left wrist and forearm continue to appear improved. ° ° °Lab Results: ° °Lab Results  °Component Value Date  ° WBC 4.7 06/26/2021  ° HGB 10.6 (L) 06/26/2021  ° HCT 32.5 (L) 06/26/2021  ° MCV 85.3 06/26/2021  ° PLT 7 (LL) 06/26/2021  ° NEUTROABS 3.4 06/26/2021  ° ° °Imaging: ° °No results found. ° °Medications: I have reviewed the patient's current medications. ° °Assessment/Plan: °Sigmoid colon adenocarcinoma °-Colonoscopy 10/21/2020- partially obstructing mass found in the sigmoid colon consistent with adenocarcinoma. °-CEA on 10/22/2020 was 3.9 °-CTs 10/22/2020-colonic mass with pelvic lymphadenopathy including right common iliac and external iliac nodes, small pulmonary nodules °-10/24/2020-sigmoid colectomy, end colostomy, tumor stuck to pelvic sidewall with rind of tissue remaining at completion of surgery °-Pathology- moderately differentiated adenocarcinoma the sigmoid colon, tumor extends into pericolonic connective tissue, no lymphovascular perineural invasion, 0/14 lymph nodes, carcinoma involves an area with the specimen was disrupted at mesenteric  margin; mismatch repair protein (IHC) normal; MSI stable °-CTs 11/24/2020-interval resection of colonic mass, decreased size of previously noted right pelvic adenopathy, new noted at the right internal/external iliac bifurcation, unchanged pulmonary nodules °-Guardant reveal 12/06/2020-ctDNA not detected °-Cycle 1 adjuvant Xeloda anticipated 01/06/2021 °-Cycle 2 adjuvant Xeloda 01/27/2021, Xeloda dose reduced secondary to thrombocytopenia (he took 1 pill a day for an unclear amount of time, less than 14 days) °Cycle 3 adjuvant Xeloda 02/17/2021 °2.  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 °3.  Thrombocytopenia-chronic °4.  Atrial flutter °5.  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  COPD °7.  Hypertension °8.  Diabetes mellitus °9.  Dementia °10.  Tobacco dependence °11. Left wrist basal cell carcinoma °-Skin biopsy 08/16/2020 - Basal cell carcinoma, nodular and infiltrative patterns, peripheral and deep margins involved °-Seen by radiation oncology 09/03/2020 °-Established care with the wound center on 09/13/2019 °-Placed on Sonidegib by dermatology, marked clinical improvement °12.  Admission with GI bleeding 11/20/2020 °Endoscopy 11/21/2020-mild prepyloric erosions, gastritis °Colonoscopy 11/22/2020-no bleeding source identified °Capsule endoscopy 11/22/2020-probable AVM with active oozing in the mid small bowel, 1 other small AVM more distally-both out of reach of enteroscope °11/26/2020 balloon enteroscopy-single nonbleeding AVM in the jejunum treated with argon plasma coagulation °  °13.  Admission 06/05/2021 with presyncope °14.  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 Mccune appears unchanged.  He has completed 1 cycle of 5-azacytidine.  Review of the CBC from today shows progressive thrombocytopenia.  We are making arrangements for  a platelet transfusion.  We discussed the potential risks of a platelet   transfusion including a transfusion reaction, infection.  He agrees to proceed.  He understands the need for close monitoring of his blood counts.  He will return for routine labs on a Monday Thursday schedule beginning 06/30/2021. ° °We will see him in follow-up prior to cycle two 5-azacytidine on 07/14/2021.  He will contact the office in the interim with any problems.  We discussed fever, chills, other signs of infection, bleeding. ° °Patient seen with Dr. Sherrill. ° ° ° °  ANP/GNP-BC  ° °06/26/2021  °2:49 PM ° °This was a shared visit with  .  [name] was interviewed and examined.  He is now at day 11 following cycle one 5-azacytidine.  He tolerated the 5-azacytidine well.  He has developed progressive thrombocytopenia.  He will receive a platelet transfusion tomorrow.  He will call for bleeding. ° °We will continue close monitoring of the CBC. ° °I was present for greater than 50% of today's visit.  I performed medical decision making. ° °Brad Sherrill, MD ° ° ° ° ° °

## 2021-06-27 ENCOUNTER — Inpatient Hospital Stay: Payer: Medicare Other

## 2021-06-27 ENCOUNTER — Telehealth: Payer: Self-pay

## 2021-06-27 ENCOUNTER — Other Ambulatory Visit: Payer: Self-pay | Admitting: Oncology

## 2021-06-27 DIAGNOSIS — C9200 Acute myeloblastic leukemia, not having achieved remission: Secondary | ICD-10-CM | POA: Diagnosis not present

## 2021-06-27 MED ORDER — SODIUM CHLORIDE 0.9% IV SOLUTION
250.0000 mL | Freq: Once | INTRAVENOUS | Status: AC
  Administered 2021-06-27: 250 mL via INTRAVENOUS

## 2021-06-27 NOTE — Telephone Encounter (Signed)
Transition Care Management Unsuccessful Follow-up Telephone Call  Date of discharge and from where:  06/10/2021  [facility]  Attempts:  1st Attempt  Reason for unsuccessful TCM follow-up call:  No answer/busy  Tomasa Rand, RN, BSN, CEN [facility] Coordinator [phone]

## 2021-06-27 NOTE — Patient Instructions (Signed)
Essential Thrombocythemia Essential thrombocythemia is a condition in which a person has too many platelets (thrombocytes) in the blood. Platelets are parts of blood that stick together and form a clot (thrombus) to help the body stop bleeding after an injury. This condition may also be called primary or essential thrombocytosis. Essential thrombocythemia happens when abnormal cells in the bone marrow (megakaryocytes) make too many platelets. What are the causes? The cause of this condition is not known. What are the signs or symptoms? This condition may not cause any symptoms. If you have symptoms, they may include: Blood clots. Weakness. Headache. Itching. Sweating or fever. Dizziness or confusion. Tingling or burning in your hands or feet. Symptoms may also include bleeding and an enlarged spleen. How is this diagnosed? This condition may be diagnosed based on: A physical exam. Your symptoms. Your medical history. Blood tests. A procedure to collect a sample of your bone marrow (bone marrow aspiration) for testing. How is this treated? If you do not have symptoms, you may not need treatment. Your health care provider may monitor your condition with regular blood tests. If you have symptoms, or if your platelet count is very high, you may be treated with: Aspirin or other medicines to thin your blood and help prevent blood clots. Medicines to reduce the number of platelets in your blood. A procedure to remove some platelets from your blood (plateletpheresis). During this procedure: Your health care provider will place an IV into one of your veins. The IV will be used to draw your blood into a machine that separates out the extra platelets. The blood with reduced platelets will be returned to your body. Follow these instructions at home: Medicines Take over-the-counter and prescription medicines only as told by your health care provider. If you are taking blood thinners: Talk with  your health care provider before you take any medicines that contain aspirin or NSAIDs, such as ibuprofen. These medicines increase your risk for dangerous bleeding. Take your medicine exactly as told, at the same time every day. Avoid activities that could cause injury or bruising, and follow instructions about how to prevent falls. Wear a medical alert bracelet or carry a card that lists what medicines you take. Tell all health care providers, including dental health care providers, about any medicines you are taking to prevent blood clots. General instructions Do not use any products that contain nicotine or tobacco. These products include cigarettes, chewing tobacco, and vaping devices, such as e-cigarettes. If you need help quitting, ask your health care provider. Ask your health care provider about managing or preventing high cholesterol, high blood pressure, and diabetes. These conditions can make essential thrombocythemia worse. Keep all follow-up visits. This is important. Contact a health care provider if: You have severe pain, and medicines do not help. You have problems taking your medicines to prevent blood clots. You have new pain, warmth, swelling, or redness in an arm or leg. This could mean you have a blood clot. You faint. You have unusual bruises. You have bloody or tarry stools. You have pink or bloody urine. Your menstrual periods are heavier than normal, if applicable. You have nosebleeds, bleeding gums, or other bleeding. Get help right away if:  You have chest pain. You have trouble breathing. You have any symptoms of a stroke. "BE FAST" is an easy way to remember the main warning signs of a stroke: B - Balance. Signs are dizziness, sudden trouble walking, or loss of balance. E - Eyes. Signs are trouble  seeing or a sudden change in vision. F - Face. Signs are sudden weakness or numbness of the face, or the face or eyelid drooping on one side. A - Arm. Signs are  weakness or numbness in an arm. This happens suddenly and usually on one side of the body. S - Speech. Signs are sudden trouble speaking, slurred speech, or trouble understanding what people say. T - Time. Time to call emergency services. Write down what time symptoms started. You have other signs of a stroke, such as: A sudden, severe headache with no known cause. Nausea or vomiting. Seizure. These symptoms may represent a serious problem that is an emergency. Do not wait to see if the symptoms will go away. Get medical help right away. Call your local emergency services (911 in the [place] Do not drive yourself to the hospital. Summary Essential thrombocythemia happens when abnormal cells in the bone marrow make too many platelets. If you have symptoms, or if your platelet count is very high, you may need treatment. Treatment can vary and may include medicines to thin the blood and prevent blood clots. Ask your health care provider about how to manage or prevent high cholesterol, high blood pressure, and diabetes. All of these can make essential thrombocythemia worse. Get help right away if you have any symptoms of stroke. This information is not intended to replace advice given to you by your health care provider. Make sure you discuss any questions you have with your health care provider. Document Revised: 11/18/2020 Document Reviewed: 11/18/2020 Elsevier Patient Education  [employer].

## 2021-06-30 ENCOUNTER — Other Ambulatory Visit: Payer: Self-pay

## 2021-06-30 ENCOUNTER — Inpatient Hospital Stay: Payer: Medicare Other

## 2021-06-30 ENCOUNTER — Telehealth: Payer: Self-pay | Admitting: *Deleted

## 2021-06-30 ENCOUNTER — Encounter: Payer: Self-pay | Admitting: *Deleted

## 2021-06-30 VITALS — BP 100/55 | HR 60 | Temp 98.1°F | Resp 18

## 2021-06-30 DIAGNOSIS — D696 Thrombocytopenia, unspecified: Secondary | ICD-10-CM

## 2021-06-30 DIAGNOSIS — C9200 Acute myeloblastic leukemia, not having achieved remission: Secondary | ICD-10-CM

## 2021-06-30 LAB — PREPARE PLATELET PHERESIS: Unit division: 0

## 2021-06-30 LAB — BPAM PLATELET PHERESIS
Blood Product Expiration Date: 202301292359
ISSUE DATE / TIME: 202301270737
Unit Type and Rh: 5100

## 2021-06-30 LAB — CBC WITH DIFFERENTIAL (CANCER CENTER ONLY)
Abs Immature Granulocytes: 0.06 10*3/uL (ref 0.00–0.07)
Basophils Absolute: 0 10*3/uL (ref 0.0–0.1)
Basophils Relative: 0 %
Eosinophils Absolute: 0 10*3/uL (ref 0.0–0.5)
Eosinophils Relative: 0 %
HCT: 30.3 % — ABNORMAL LOW (ref 39.0–52.0)
Hemoglobin: 9.8 g/dL — ABNORMAL LOW (ref 13.0–17.0)
Immature Granulocytes: 2 %
Lymphocytes Relative: 16 %
Lymphs Abs: 0.6 10*3/uL — ABNORMAL LOW (ref 0.7–4.0)
MCH: 28 pg (ref 26.0–34.0)
MCHC: 32.3 g/dL (ref 30.0–36.0)
MCV: 86.6 fL (ref 80.0–100.0)
Monocytes Absolute: 0.2 10*3/uL (ref 0.1–1.0)
Monocytes Relative: 5 %
Neutro Abs: 3.1 10*3/uL (ref 1.7–7.7)
Neutrophils Relative %: 77 %
Platelet Count: 7 10*3/uL — CL (ref 150–400)
RBC: 3.5 MIL/uL — ABNORMAL LOW (ref 4.22–5.81)
RDW: 17.5 % — ABNORMAL HIGH (ref 11.5–15.5)
Smear Review: NORMAL
WBC Count: 4 10*3/uL (ref 4.0–10.5)
nRBC: 0 % (ref 0.0–0.2)

## 2021-06-30 MED ORDER — SODIUM CHLORIDE 0.9% IV SOLUTION
250.0000 mL | Freq: Once | INTRAVENOUS | Status: AC
  Administered 2021-06-30: 250 mL via INTRAVENOUS

## 2021-06-30 NOTE — Progress Notes (Signed)
CRITICAL VALUE STICKER  CRITICAL VALUE: Platelet 7,000  RECEIVER (on-site recipient of call):Tauno Falotico,RN  DATE & TIME NOTIFIED: 06/30/21 @ 1125  MESSENGER (representative from lab):Phyllis  MD NOTIFIED: Benay Spice  TIME OF NOTIFICATION:1130  RESPONSE: Transfuse platelet today.

## 2021-06-30 NOTE — Patient Instructions (Signed)
Blood Transfusion, Adult °A blood transfusion is a procedure in which you receive blood through an IV tube. You may need this procedure because of: °A bleeding disorder. °An illness. °An injury. °A surgery. °The blood may come from someone else (a donor). You may also be able to donate blood for yourself. The blood given in a transfusion is made up of different types of cells. You may get: °Red blood cells. These carry oxygen to the cells in the body. °White blood cells. These help you fight infections. °Platelets. These help your blood to clot. °Plasma. This is the liquid part of your blood. It carries proteins and other substances through the body. °If you have a clotting disorder, you may also get other types of blood products. °Tell your doctor about: °Any blood disorders you have. °Any reactions you have had during a blood transfusion in the past. °Any allergies you have. °All medicines you are taking, including vitamins, herbs, eye drops, creams, and over-the-counter medicines. °Any surgeries you have had. °Any medical conditions you have. This includes any recent fever or cold symptoms. °Whether you are pregnant or may be pregnant. °What are the risks? °Generally, this is a safe procedure. However, problems may occur. °The most common problems include: °A mild allergic reaction. This includes red, swollen areas of skin (hives) and itching. °Fever or chills. This may be the body's response to new blood cells received. This may happen during or up to 4 hours after the transfusion. °More serious problems may include: °Too much fluid in the lungs. This may cause breathing problems. °A serious allergic reaction. This includes breathing trouble or swelling around the face and lips. °Lung injury. This causes breathing trouble and low oxygen in the blood. This can happen within hours of the transfusion or days later. °Too much iron. This can happen after getting many blood transfusions over a period of time. °An  infection or virus passed through the blood. This is rare. Donated blood is carefully tested before it is given. °Your body's defense system (immune system) trying to attack the new blood cells. This is rare. Symptoms may include fever, chills, nausea, low blood pressure, and low back or chest pain. °Donated cells attacking healthy tissues. This is rare. °What happens before the procedure? °Medicines °Ask your doctor about: °Changing or stopping your normal medicines. This is important. °Taking aspirin and ibuprofen. Do not take these medicines unless your doctor tells you to take them. °Taking over-the-counter medicines, vitamins, herbs, and supplements. °General instructions °Follow instructions from your doctor about what you cannot eat or drink. °You will have a blood test to find out your blood type. The test also finds out what type of blood your body will accept and matches it to the donor type. °If you are going to have a planned surgery, you may be able to donate your own blood. This may be done in case you need a transfusion. °You will have your temperature, blood pressure, and pulse checked. °You may receive medicine to help prevent an allergic reaction. This may be done if you have had a reaction to a transfusion before. This medicine may be given to you by mouth or through an IV tube. °This procedure lasts about 1-4 hours. Plan for the time you need. °What happens during the procedure? ° °An IV tube will be put into one of your veins. °The bag of donated blood will be attached to your IV tube. Then, the blood will enter through your vein. °Your temperature,   blood pressure, and pulse will be checked often. This is done to find early signs of a transfusion reaction. °Tell your nurse right away if you have any of these symptoms: °Shortness of breath or trouble breathing. °Chest or back pain. °Fever or chills. °Red, swollen areas of skin or itching. °If you have any signs or symptoms of a reaction, your  transfusion will be stopped. You may also be given medicine. °When the transfusion is finished, your IV tube will be taken out. °Pressure may be put on the IV site for a few minutes. °A bandage (dressing) will be put on the IV site. °The procedure may vary among doctors and hospitals. °What happens after the procedure? °You will be monitored until you leave the hospital or clinic. This includes checking your temperature, blood pressure, pulse, breathing rate, and blood oxygen level. °Your blood may be tested to see how you are responding to the transfusion. °You may be warmed with fluids or blankets. This is done to keep the temperature of your body normal. °If you have your procedure in an outpatient setting, you will be told whom to contact to report any reactions. °Where to find more information °To learn more, visit the [employer]: redcross.org °Summary °A blood transfusion is a procedure in which you are given blood through an IV tube. °The blood may come from someone else (a donor). You may also be able to donate blood for yourself. °The blood you are given is made up of different blood cells. You may receive red blood cells, platelets, plasma, or white blood cells. °Your temperature, blood pressure, and pulse will be checked often. °After the procedure, your blood may be tested to see how you are responding. °This information is not intended to replace advice given to you by your health care provider. Make sure you discuss any questions you have with your health care provider. °Document Revised: 11/10/2018 Document Reviewed: 11/10/2018 °Elsevier Patient Education © 2022 Elsevier Inc. ° °

## 2021-06-30 NOTE — Telephone Encounter (Signed)
Transition Care Management Unsuccessful Follow-up Telephone Call  Date of discharge and from where: [facility] 06/10/2021  Attempts:  2nd Attempt  Reason for unsuccessful TCM follow-up call:  No answer/busy  Jacqlyn Larsen [facility], BSN RN Case Manager [phone]

## 2021-07-01 LAB — BPAM PLATELET PHERESIS
Blood Product Expiration Date: 202301302359
ISSUE DATE / TIME: 202301301152
Unit Type and Rh: 5100

## 2021-07-01 LAB — PREPARE PLATELET PHERESIS: Unit division: 0

## 2021-07-02 ENCOUNTER — Telehealth: Payer: Self-pay | Admitting: Oncology

## 2021-07-03 ENCOUNTER — Inpatient Hospital Stay: Payer: Medicare Other | Attending: Nurse Practitioner

## 2021-07-03 ENCOUNTER — Other Ambulatory Visit: Payer: Self-pay

## 2021-07-03 ENCOUNTER — Inpatient Hospital Stay: Payer: Medicare Other

## 2021-07-03 ENCOUNTER — Other Ambulatory Visit: Payer: Self-pay | Admitting: *Deleted

## 2021-07-03 DIAGNOSIS — D61810 Antineoplastic chemotherapy induced pancytopenia: Secondary | ICD-10-CM | POA: Insufficient documentation

## 2021-07-03 DIAGNOSIS — I10 Essential (primary) hypertension: Secondary | ICD-10-CM | POA: Insufficient documentation

## 2021-07-03 DIAGNOSIS — E119 Type 2 diabetes mellitus without complications: Secondary | ICD-10-CM | POA: Insufficient documentation

## 2021-07-03 DIAGNOSIS — J449 Chronic obstructive pulmonary disease, unspecified: Secondary | ICD-10-CM | POA: Diagnosis not present

## 2021-07-03 DIAGNOSIS — D696 Thrombocytopenia, unspecified: Secondary | ICD-10-CM | POA: Diagnosis not present

## 2021-07-03 DIAGNOSIS — D509 Iron deficiency anemia, unspecified: Secondary | ICD-10-CM | POA: Diagnosis not present

## 2021-07-03 DIAGNOSIS — F17210 Nicotine dependence, cigarettes, uncomplicated: Secondary | ICD-10-CM | POA: Insufficient documentation

## 2021-07-03 DIAGNOSIS — C9200 Acute myeloblastic leukemia, not having achieved remission: Secondary | ICD-10-CM

## 2021-07-03 DIAGNOSIS — D469 Myelodysplastic syndrome, unspecified: Secondary | ICD-10-CM | POA: Insufficient documentation

## 2021-07-03 DIAGNOSIS — Z85828 Personal history of other malignant neoplasm of skin: Secondary | ICD-10-CM | POA: Diagnosis not present

## 2021-07-03 DIAGNOSIS — Z5111 Encounter for antineoplastic chemotherapy: Secondary | ICD-10-CM | POA: Diagnosis not present

## 2021-07-03 DIAGNOSIS — Z5189 Encounter for other specified aftercare: Secondary | ICD-10-CM | POA: Diagnosis not present

## 2021-07-03 DIAGNOSIS — F0390 Unspecified dementia without behavioral disturbance: Secondary | ICD-10-CM | POA: Diagnosis not present

## 2021-07-03 DIAGNOSIS — I4892 Unspecified atrial flutter: Secondary | ICD-10-CM | POA: Diagnosis not present

## 2021-07-03 LAB — CBC WITH DIFFERENTIAL (CANCER CENTER ONLY)
Abs Immature Granulocytes: 0.05 10*3/uL (ref 0.00–0.07)
Basophils Absolute: 0 10*3/uL (ref 0.0–0.1)
Basophils Relative: 0 %
Eosinophils Absolute: 0 10*3/uL (ref 0.0–0.5)
Eosinophils Relative: 0 %
HCT: 26.7 % — ABNORMAL LOW (ref 39.0–52.0)
Hemoglobin: 8.6 g/dL — ABNORMAL LOW (ref 13.0–17.0)
Immature Granulocytes: 2 %
Lymphocytes Relative: 19 %
Lymphs Abs: 0.6 10*3/uL — ABNORMAL LOW (ref 0.7–4.0)
MCH: 28 pg (ref 26.0–34.0)
MCHC: 32.2 g/dL (ref 30.0–36.0)
MCV: 87 fL (ref 80.0–100.0)
Monocytes Absolute: 0.1 10*3/uL (ref 0.1–1.0)
Monocytes Relative: 3 %
Neutro Abs: 2.4 10*3/uL (ref 1.7–7.7)
Neutrophils Relative %: 76 %
Platelet Count: 8 10*3/uL — CL (ref 150–400)
RBC: 3.07 MIL/uL — ABNORMAL LOW (ref 4.22–5.81)
RDW: 17.8 % — ABNORMAL HIGH (ref 11.5–15.5)
WBC Count: 3.2 10*3/uL — ABNORMAL LOW (ref 4.0–10.5)
nRBC: 0 % (ref 0.0–0.2)

## 2021-07-03 MED ORDER — SODIUM CHLORIDE 0.9% IV SOLUTION
250.0000 mL | Freq: Once | INTRAVENOUS | Status: AC
  Administered 2021-07-03: 250 mL via INTRAVENOUS

## 2021-07-03 NOTE — Patient Instructions (Signed)
Platelet Transfusion ?A platelet transfusion is a procedure in which a person receives donated platelets through an IV. Platelets are parts of blood that stick together and form a clot to help the body stop bleeding after an injury. If you have too few platelets, your blood may have trouble clotting. This may cause you to bleed and bruise very easily. ?You may need a platelet transfusion if you have a condition that causes a low number of platelets (thrombocytopenia). A platelet transfusion may be used to stop or prevent excessive bleeding. ?Tell a health care provider about: ?Any reactions you have had during previous transfusions. ?Any allergies you have. ?All medicines you are taking, including vitamins, herbs, eye drops, creams, and over-the-counter medicines. ?Any bleeding problems you have. ?Any surgeries you have had. ?Any medical conditions you have. ?Whether you are pregnant or may be pregnant. ?What are the risks? ?Generally, this is a safe procedure. However, problems may occur, including: ?Fever. ?Infection. ?Allergic reaction to the donated (donor) platelets. ?Your body's disease-fighting system (immune system) attacking the donor platelets (hemolytic reaction). This is rare. ?A rare reaction that causes lung damage (transfusion-related acute lung injury). ?What happens before the procedure? ?Medicines ?Ask your health care provider about: ?Changing or stopping your regular medicines. This is especially important if you are taking diabetes medicines or blood thinners. ?Taking medicines such as aspirin and ibuprofen. These medicines can thin your blood. Do not take these medicines unless your health care provider tells you to take them. ?Taking over-the-counter medicines, vitamins, herbs, and supplements. ?General instructions ?You will have a blood test to determine your blood type. Your blood type determines what kind of platelets you will be given. ?Follow instructions from your health care provider  about eating or drinking restrictions. ?If you have had an allergic reaction to a transfusion in the past, you may be given medicine to help prevent a reaction. ?Your temperature, blood pressure, pulse, and breathing will be monitored. ?What happens during the procedure? ? ?An IV will be inserted into one of your veins. ?For your safety, two health care providers will verify your identity along with the donor platelets about to be infused. ?A bag of donor platelets will be connected to your IV. The platelets will flow into your bloodstream. This usually takes 30-60 minutes. ?Your temperature, blood pressure, pulse, and breathing will be monitored during the transfusion. This helps detect early signs of any reaction. ?You will also be monitored for other symptoms that may indicate a reaction, including chills, hives, or itching. ?If you have signs of a reaction at any time, your transfusion will be stopped, and you may be given medicine to help manage the reaction. ?When your transfusion is complete, your IV will be removed. ?Pressure may be applied to the IV site for a few minutes to stop any bleeding. ?The IV site will be covered with a bandage (dressing). ?The procedure may vary among health care providers and hospitals. ?What can I expect after the procedure? ?Your blood pressure, temperature, pulse, and breathing will be monitored until you leave the hospital or clinic. ?You may have some bruising and soreness at your IV site. ?Follow these instructions at home: ?Medicines ?Take over-the-counter and prescription medicines only as told by your health care provider. ?Talk with your health care provider before you take any medicines that contain aspirin or NSAIDs, such as ibuprofen. These medicines increase your risk for dangerous bleeding. ?IV site care ?Check your IV site every day for signs of infection. Check for: ?  Redness, swelling, or pain. ?Fluid or blood. If fluid or blood drains from your IV site, use your  hands to press down firmly on a bandage covering the area for a minute or two. Doing this should stop the bleeding. ?Warmth. ?Pus or a bad smell. ?General instructions ?Change or remove your dressing as told by your health care provider. ?Return to your normal activities as told by your health care provider. Ask your health care provider what activities are safe for you. ?Do not take baths, swim, or use a hot tub until your health care provider approves. Ask your health care provider if you may take showers. ?Keep all follow-up visits. This is important. ?Contact a health care provider if: ?You have a headache that does not go away with medicine. ?You have hives, rash, or itchy skin. ?You have nausea or vomiting. ?You feel unusually tired or weak. ?You have signs of infection at your IV site. ?Get help right away if: ?You have a fever or chills. ?You urinate less often than usual. ?Your urine is darker colored than normal. ?You have any of the following: ?Trouble breathing. ?Pain in your back, abdomen, or chest. ?Cool, clammy skin. ?A fast heartbeat. ?Summary ?Platelets are tiny pieces of blood cells that clump together to form a blood clot when you have an injury. If you have too few platelets, your blood may have trouble clotting. ?A platelet transfusion is a procedure in which you receive donated platelets through an IV. ?A platelet transfusion may be used to stop or prevent excessive bleeding. ?After the procedure, check your IV site every day for signs of infection. ?This information is not intended to replace advice given to you by your health care provider. Make sure you discuss any questions you have with your health care provider. ?Document Revised: 11/21/2020 Document Reviewed: 11/21/2020 ?Elsevier Patient Education ? 2022 Elsevier Inc. ? ?

## 2021-07-03 NOTE — Progress Notes (Addendum)
CRITICAL VALUE STICKER  CRITICAL VALUE:platelet 8,000   RECEIVER (on-site recipient of call):Sheryll Dymek,RN  DATE & TIME NOTIFIED: 07/03/21 @ 0849  MESSENGER (representative from lab):Otila Kluver  MD NOTIFIED: Dr. Benay Spice  TIME OF NOTIFICATION:0850  RESPONSE: Transfuse 1 unit platelets today.  Blood bank notified to prepare platelets today.

## 2021-07-04 ENCOUNTER — Other Ambulatory Visit: Payer: Self-pay

## 2021-07-04 LAB — BPAM PLATELET PHERESIS
Blood Product Expiration Date: 202302022359
ISSUE DATE / TIME: 202302020921
Unit Type and Rh: 5100

## 2021-07-04 LAB — PREPARE PLATELET PHERESIS: Unit division: 0

## 2021-07-04 MED ORDER — ROSUVASTATIN CALCIUM 20 MG PO TABS: 20.0000 mg | ORAL_TABLET | Freq: Every day | ORAL | 3 refills | Status: DC

## 2021-07-04 MED ORDER — DILTIAZEM HCL ER COATED BEADS 120 MG PO CP24: 120.0000 mg | ORAL_CAPSULE | Freq: Every day | ORAL | 3 refills | Status: DC

## 2021-07-07 ENCOUNTER — Inpatient Hospital Stay: Payer: Medicare Other

## 2021-07-07 ENCOUNTER — Other Ambulatory Visit: Payer: Self-pay

## 2021-07-07 ENCOUNTER — Other Ambulatory Visit: Payer: Self-pay | Admitting: *Deleted

## 2021-07-07 DIAGNOSIS — D696 Thrombocytopenia, unspecified: Secondary | ICD-10-CM

## 2021-07-07 DIAGNOSIS — Z5111 Encounter for antineoplastic chemotherapy: Secondary | ICD-10-CM | POA: Diagnosis not present

## 2021-07-07 DIAGNOSIS — C9200 Acute myeloblastic leukemia, not having achieved remission: Secondary | ICD-10-CM

## 2021-07-07 LAB — CBC WITH DIFFERENTIAL (CANCER CENTER ONLY)
Abs Immature Granulocytes: 0.04 10*3/uL (ref 0.00–0.07)
Basophils Absolute: 0 10*3/uL (ref 0.0–0.1)
Basophils Relative: 0 %
Eosinophils Absolute: 0 10*3/uL (ref 0.0–0.5)
Eosinophils Relative: 0 %
HCT: 24.7 % — ABNORMAL LOW (ref 39.0–52.0)
Hemoglobin: 8.2 g/dL — ABNORMAL LOW (ref 13.0–17.0)
Immature Granulocytes: 2 %
Lymphocytes Relative: 23 %
Lymphs Abs: 0.5 10*3/uL — ABNORMAL LOW (ref 0.7–4.0)
MCH: 28.7 pg (ref 26.0–34.0)
MCHC: 33.2 g/dL (ref 30.0–36.0)
MCV: 86.4 fL (ref 80.0–100.0)
Monocytes Absolute: 0.1 10*3/uL (ref 0.1–1.0)
Monocytes Relative: 5 %
Neutro Abs: 1.6 10*3/uL — ABNORMAL LOW (ref 1.7–7.7)
Neutrophils Relative %: 70 %
Platelet Count: 11 10*3/uL — ABNORMAL LOW (ref 150–400)
RBC: 2.86 MIL/uL — ABNORMAL LOW (ref 4.22–5.81)
RDW: 17.7 % — ABNORMAL HIGH (ref 11.5–15.5)
WBC Count: 2.3 10*3/uL — ABNORMAL LOW (ref 4.0–10.5)
nRBC: 0 % (ref 0.0–0.2)

## 2021-07-07 LAB — SAMPLE TO BLOOD BANK

## 2021-07-07 MED ORDER — SODIUM CHLORIDE 0.9% IV SOLUTION
250.0000 mL | Freq: Once | INTRAVENOUS | Status: AC
  Administered 2021-07-07: 250 mL via INTRAVENOUS

## 2021-07-07 NOTE — Progress Notes (Signed)
Blood bank had difficulty seeing prepare platelet order. Re-entered. Will be ready in 5 minutes. Called [facility] for stat pick up.

## 2021-07-07 NOTE — Progress Notes (Signed)
Placed orders for type and screen on 07/10/21 since Hgb today 8.2. No blood transfusion today. Will receive platelets only.

## 2021-07-07 NOTE — Patient Instructions (Signed)
Blood Transfusion, Adult, Care After This sheet gives you information about how to care for yourself after your procedure. Your doctor may also give you more specific instructions. If you have problems or questions, contact your doctor. What can I expect after the procedure? After the procedure, it is common to have: Bruising and soreness at the IV site. A headache. Follow these instructions at home: Insertion site care   Follow instructions from your doctor about how to take care of your insertion site. This is where an IV tube was put into your vein. Make sure you: Wash your hands with soap and water before and after you change your bandage (dressing). If you cannot use soap and water, use hand sanitizer. Change your bandage as told by your doctor. Check your insertion site every day for signs of infection. Check for: Redness, swelling, or pain. Bleeding from the site. Warmth. Pus or a bad smell. General instructions Take over-the-counter and prescription medicines only as told by your doctor. Rest as told by your doctor. Go back to your normal activities as told by your doctor. Keep all follow-up visits as told by your doctor. This is important. Contact a doctor if: You have itching or red, swollen areas of skin (hives). You feel worried or nervous (anxious). You feel weak after doing your normal activities. You have redness, swelling, warmth, or pain around the insertion site. You have blood coming from the insertion site, and the blood does not stop with pressure. You have pus or a bad smell coming from the insertion site. Get help right away if: You have signs of a serious reaction. This may be coming from an allergy or the body's defense system (immune system). Signs include: Trouble breathing or shortness of breath. Swelling of the face or feeling warm (flushed). Fever or chills. Head, chest, or back pain. Dark pee (urine) or blood in the pee. Widespread rash. Fast  heartbeat. Feeling dizzy or light-headed. You may receive your blood transfusion in an outpatient setting. If so, you will be told whom to contact to report any reactions. These symptoms may be an emergency. Do not wait to see if the symptoms will go away. Get medical help right away. Call your local emergency services (911 in the [place] Do not drive yourself to the hospital. Summary Bruising and soreness at the IV site are common. Check your insertion site every day for signs of infection. Rest as told by your doctor. Go back to your normal activities as told by your doctor. Get help right away if you have signs of a serious reaction. This information is not intended to replace advice given to you by your health care provider. Make sure you discuss any questions you have with your health care provider. Document Revised: 09/12/2020 Document Reviewed: 11/10/2018 Elsevier Patient Education  2022 Elsevier Inc.  

## 2021-07-08 LAB — BPAM PLATELET PHERESIS
Blood Product Expiration Date: 202302092359
ISSUE DATE / TIME: 202302061000
Unit Type and Rh: 5100

## 2021-07-08 LAB — PREPARE PLATELET PHERESIS: Unit division: 0

## 2021-07-09 ENCOUNTER — Other Ambulatory Visit: Payer: Self-pay

## 2021-07-09 DIAGNOSIS — C187 Malignant neoplasm of sigmoid colon: Secondary | ICD-10-CM

## 2021-07-10 ENCOUNTER — Other Ambulatory Visit: Payer: Self-pay

## 2021-07-10 ENCOUNTER — Encounter: Payer: Self-pay | Admitting: *Deleted

## 2021-07-10 ENCOUNTER — Inpatient Hospital Stay: Payer: Medicare Other

## 2021-07-10 DIAGNOSIS — Z5111 Encounter for antineoplastic chemotherapy: Secondary | ICD-10-CM | POA: Diagnosis not present

## 2021-07-10 DIAGNOSIS — C187 Malignant neoplasm of sigmoid colon: Secondary | ICD-10-CM

## 2021-07-10 DIAGNOSIS — C9200 Acute myeloblastic leukemia, not having achieved remission: Secondary | ICD-10-CM

## 2021-07-10 LAB — CBC WITH DIFFERENTIAL (CANCER CENTER ONLY)
Abs Immature Granulocytes: 0.03 10*3/uL (ref 0.00–0.07)
Basophils Absolute: 0 10*3/uL (ref 0.0–0.1)
Basophils Relative: 0 %
Eosinophils Absolute: 0 10*3/uL (ref 0.0–0.5)
Eosinophils Relative: 1 %
HCT: 25.1 % — ABNORMAL LOW (ref 39.0–52.0)
Hemoglobin: 8.1 g/dL — ABNORMAL LOW (ref 13.0–17.0)
Immature Granulocytes: 2 %
Lymphocytes Relative: 24 %
Lymphs Abs: 0.4 10*3/uL — ABNORMAL LOW (ref 0.7–4.0)
MCH: 27.8 pg (ref 26.0–34.0)
MCHC: 32.3 g/dL (ref 30.0–36.0)
MCV: 86.3 fL (ref 80.0–100.0)
Monocytes Absolute: 0.1 10*3/uL (ref 0.1–1.0)
Monocytes Relative: 4 %
Neutro Abs: 1.2 10*3/uL — ABNORMAL LOW (ref 1.7–7.7)
Neutrophils Relative %: 69 %
Platelet Count: 20 10*3/uL — ABNORMAL LOW (ref 150–400)
RBC: 2.91 MIL/uL — ABNORMAL LOW (ref 4.22–5.81)
RDW: 17.9 % — ABNORMAL HIGH (ref 11.5–15.5)
Smear Review: NORMAL
WBC Count: 1.7 10*3/uL — ABNORMAL LOW (ref 4.0–10.5)
nRBC: 0 % (ref 0.0–0.2)

## 2021-07-10 LAB — SAMPLE TO BLOOD BANK

## 2021-07-10 NOTE — Progress Notes (Signed)
MD reviewed CBC result ([place] pending). No need for platelets (20,000 today) wr blood (Hgb 8.1) today. F/U as scheduled on 07/14/21. Transport home being called.

## 2021-07-10 NOTE — Progress Notes (Signed)
Per Merceda Elks, RN (Dr. Gearldine Shown RN), no platelet transfusion today.  Patient made aware and verbalized understanding.  Printout of next appointments given to patient.

## 2021-07-11 ENCOUNTER — Encounter: Payer: Self-pay | Admitting: Oncology

## 2021-07-11 NOTE — Telephone Encounter (Signed)
left a message to let patient his ride for 2/2 will be there at 7:00 a.m to make sure he

## 2021-07-13 ENCOUNTER — Other Ambulatory Visit: Payer: Self-pay | Admitting: Oncology

## 2021-07-14 ENCOUNTER — Inpatient Hospital Stay (HOSPITAL_BASED_OUTPATIENT_CLINIC_OR_DEPARTMENT_OTHER): Payer: Medicare Other | Admitting: Nurse Practitioner

## 2021-07-14 ENCOUNTER — Encounter: Payer: Self-pay | Admitting: Nurse Practitioner

## 2021-07-14 ENCOUNTER — Inpatient Hospital Stay: Payer: Medicare Other

## 2021-07-14 ENCOUNTER — Other Ambulatory Visit: Payer: Self-pay

## 2021-07-14 VITALS — BP 122/73 | HR 71 | Temp 97.8°F | Resp 19 | Ht 72.0 in | Wt 149.6 lb

## 2021-07-14 DIAGNOSIS — C187 Malignant neoplasm of sigmoid colon: Secondary | ICD-10-CM

## 2021-07-14 DIAGNOSIS — C9200 Acute myeloblastic leukemia, not having achieved remission: Secondary | ICD-10-CM

## 2021-07-14 DIAGNOSIS — Z5111 Encounter for antineoplastic chemotherapy: Secondary | ICD-10-CM | POA: Diagnosis not present

## 2021-07-14 DIAGNOSIS — D696 Thrombocytopenia, unspecified: Secondary | ICD-10-CM

## 2021-07-14 LAB — CMP (CANCER CENTER ONLY)
ALT: 33 U/L (ref 0–44)
AST: 21 U/L (ref 15–41)
Albumin: 3.6 g/dL (ref 3.5–5.0)
Alkaline Phosphatase: 75 U/L (ref 38–126)
Anion gap: 7 (ref 5–15)
BUN: 26 mg/dL — ABNORMAL HIGH (ref 8–23)
CO2: 26 mmol/L (ref 22–32)
Calcium: 9 mg/dL (ref 8.9–10.3)
Chloride: 104 mmol/L (ref 98–111)
Creatinine: 0.87 mg/dL (ref 0.61–1.24)
GFR, Estimated: >60 mL/min (ref >60)
Glucose, Bld: 86 mg/dL (ref 70–99)
Potassium: 4.5 mmol/L (ref 3.5–5.1)
Sodium: 137 mmol/L (ref 135–145)
Total Bilirubin: 1 mg/dL (ref 0.3–1.2)
Total Protein: 7.2 g/dL (ref 6.5–8.1)

## 2021-07-14 LAB — CBC WITH DIFFERENTIAL (CANCER CENTER ONLY)
Abs Immature Granulocytes: 0.02 10*3/uL (ref 0.00–0.07)
Basophils Absolute: 0 10*3/uL (ref 0.0–0.1)
Basophils Relative: 0 %
Eosinophils Absolute: 0 10*3/uL (ref 0.0–0.5)
Eosinophils Relative: 1 %
HCT: 24.9 % — ABNORMAL LOW (ref 39.0–52.0)
Hemoglobin: 8.1 g/dL — ABNORMAL LOW (ref 13.0–17.0)
Immature Granulocytes: 2 %
Lymphocytes Relative: 32 %
Lymphs Abs: 0.4 10*3/uL — ABNORMAL LOW (ref 0.7–4.0)
MCH: 28.2 pg (ref 26.0–34.0)
MCHC: 32.5 g/dL (ref 30.0–36.0)
MCV: 86.8 fL (ref 80.0–100.0)
Monocytes Absolute: 0.1 10*3/uL (ref 0.1–1.0)
Monocytes Relative: 5 %
Neutro Abs: 0.8 10*3/uL — ABNORMAL LOW (ref 1.7–7.7)
Neutrophils Relative %: 60 %
Platelet Count: 17 10*3/uL — ABNORMAL LOW (ref 150–400)
RBC: 2.87 MIL/uL — ABNORMAL LOW (ref 4.22–5.81)
RDW: 17.7 % — ABNORMAL HIGH (ref 11.5–15.5)
WBC Count: 1.3 10*3/uL — ABNORMAL LOW (ref 4.0–10.5)
nRBC: 0 % (ref 0.0–0.2)

## 2021-07-14 LAB — SAMPLE TO BLOOD BANK

## 2021-07-14 LAB — LACTATE DEHYDROGENASE: LDH: 130 U/L (ref 98–192)

## 2021-07-14 MED ORDER — AZACITIDINE CHEMO SQ INJECTION
50.0000 mg/m2 | Freq: Once | INTRAMUSCULAR | Status: AC
  Administered 2021-07-14: 92.5 mg via SUBCUTANEOUS
  Filled 2021-07-14: qty 3.7

## 2021-07-14 MED ORDER — ONDANSETRON HCL 8 MG PO TABS
8.0000 mg | ORAL_TABLET | Freq: Once | ORAL | Status: AC
  Administered 2021-07-14: 8 mg via ORAL
  Filled 2021-07-14: qty 1

## 2021-07-14 NOTE — Progress Notes (Signed)
Patient presents for treatment. RN assessment completed along with the following:  Labs/vitals reviewed - Yes, and within treatment parameters.   Weight within 10% of previous measurement - Yes Informed consent completed and reflects current therapy/intent - Yes, on date 06/16/21             Provider progress note reviewed - Today's provider note is not yet available. I reviewed the most recent oncology provider progress note in chart dated 06/26/21. Treatment/Antibody/Supportive plan reviewed - Yes, and there are no adjustments needed for today's treatment. S&H and other orders reviewed - Yes, and there are no additional orders identified. Previous treatment date reviewed - Yes, and the appropriate amount of time has elapsed between treatments. Clinic Hand Off Received from - Leander Rams, NP  Patient to proceed with treatment.

## 2021-07-14 NOTE — Progress Notes (Signed)
[facility] OFFICE PROGRESS NOTE   Diagnosis: AML  INTERVAL HISTORY:   Mr. Buckwalter returns as scheduled.  He completed cycle one 5-azacytidine beginning 06/16/2021.  He denies bleeding.  No fever.  No nausea or vomiting.  No rash.  He has a good appetite.  He needs colostomy supplies.  Objective:  Vital signs in last 24 hours:  Blood pressure 122/73, pulse 71, temperature 97.8 F (36.6 C), temperature source Oral, resp. rate 19, height 6' (1.829 m), weight 149 lb 9.6 oz (67.9 kg), SpO2 98 %.    HEENT: No thrush or ulcers.  No bleeding in the mouth. Resp: Lungs clear bilaterally. Cardio: Regular rate and rhythm. GI: Abdomen soft and nontender.  No hepatomegaly.  Soft brown stool in the colostomy collection bag. Vascular: No leg edema. Skin: Tiny bruises/petechiae scattered over the lower legs.  Ecchymosis scattered over the forearms.  Skin lesions at the left wrist and forearm show continued improvement.   Lab Results:  Lab Results  Component Value Date   WBC 1.7 (L) 07/10/2021   HGB 8.1 (L) 07/10/2021   HCT 25.1 (L) 07/10/2021   MCV 86.3 07/10/2021   PLT 20 (L) 07/10/2021   NEUTROABS 1.2 (L) 07/10/2021    Imaging:  No results found.  Medications: I have reviewed the patient's current medications.  Assessment/Plan: Sigmoid colon adenocarcinoma -Colonoscopy 10/21/2020- partially obstructing mass found in the sigmoid colon consistent with adenocarcinoma. -CEA on 10/22/2020 was 3.9 -CTs 10/22/2020-colonic mass with pelvic lymphadenopathy including right common iliac and external iliac nodes, small pulmonary nodules -10/24/2020-sigmoid colectomy, end colostomy, tumor stuck to pelvic sidewall with rind of tissue remaining at completion of surgery -Pathology- moderately differentiated adenocarcinoma the sigmoid colon, tumor extends into pericolonic connective tissue, no lymphovascular perineural invasion, 0/14 lymph nodes, carcinoma involves an area with the  specimen was disrupted at mesenteric margin; mismatch repair protein (IHC) normal; MSI stable -CTs 11/24/2020-interval resection of colonic mass, decreased size of previously noted right pelvic adenopathy, new noted at the right internal/external iliac bifurcation, unchanged pulmonary nodules -Guardant reveal 12/06/2020-ctDNA not detected -Cycle 1 adjuvant Xeloda anticipated 01/06/2021 -Cycle 2 adjuvant Xeloda 01/27/2021, Xeloda dose reduced secondary to thrombocytopenia (he took 1 pill a day for an unclear amount of time, less than 14 days) Cycle 3 adjuvant Xeloda 02/17/2021 2.  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 3.  Thrombocytopenia-chronic 4.  Atrial flutter 5.  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  COPD 7.  Hypertension 8.  Diabetes mellitus 9.  Dementia 10.  Tobacco dependence 11. Left wrist basal cell carcinoma -Skin biopsy 08/16/2020 - Basal cell carcinoma, nodular and infiltrative patterns, peripheral and deep margins involved -Seen by radiation oncology 09/03/2020 -Established care with the wound center on 09/13/2019 -Placed on Sonidegib by dermatology, marked clinical improvement 12.  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  Admission 06/05/2021 with presyncope 14.  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 Mcclaren appears stable.  He has completed 1 cycle  of 5-azacytidine.  He tolerated well.  He required platelet  transfusion support.  Plan to proceed with cycle two 5-azacytidine today as scheduled.  He will receive white cell growth factor support on 07/21/2021.  CBC and chemistry panel reviewed.  Labs adequate to proceed with treatment.  He understands to contact the office with fever and/or bleeding.  We will continue labs on a Monday Thursday schedule, platelet and red cell transfusion support as needed.  We will see him in follow-up in 2 weeks.  We are available to see him sooner if needed.  Patient seen with Dr. Benay Spice.    Ned Card ANP/GNP-BC   07/14/2021  11:34 AM This was a shared visit with Ned Card.  Mr. Bhola was interviewed and examined.  He appears stable.  He tolerated the first cycle of 5-azacytidine well.  He is stable from a hematologic standpoint.  He will begin cycle 2 today.  He will again receive G-CSF support.  We will monitor his CBC frequently and provide Red cell and platelet transfusion support as needed.  He continues Odomzo for treatment of the basal cell carcinomas.  I was present greater than 50% of today's visit.  I performed medical decision making.   Julieanne Manson, MD

## 2021-07-14 NOTE — Patient Instructions (Signed)
[facility]  Discharge Instructions: Thank you for choosing [facility] to provide your oncology and hematology care.   If you have a lab appointment with the [facility], please go directly to the [facility] and check in at the registration area.   Wear comfortable clothing and clothing appropriate for easy access to any Portacath or PICC line.   We strive to give you quality time with your provider. You may need to reschedule your appointment if you arrive late (15 or more minutes).  Arriving late affects you and other patients whose appointments are after yours.  Also, if you miss three or more appointments without notifying the office, you may be dismissed from the clinic at the providers discretion.      For prescription refill requests, have your pharmacy contact our office and allow 72 hours for refills to be completed.    Today you received the following chemotherapy and/or immunotherapy agents: Vidaza  Azacitidine suspension for injection (subcutaneous use) What is this medication? AZACITIDINE (ay Penryn) is a chemotherapy drug. This medicine reduces the growth of cancer cells and can suppress the immune system. It is used for treating myelodysplastic syndrome or some types of leukemia. This medicine may be used for other purposes; ask your health care provider or pharmacist if you have questions. COMMON BRAND NAME(S): Vidaza What should I tell my care team before I take this medication? They need to know if you have any of these conditions: kidney disease liver disease liver tumors an unusual or allergic reaction to azacitidine, mannitol, other medicines, foods, dyes, or preservatives pregnant or trying to get pregnant breast-feeding How should I use this medication? This medicine is for injection under the skin. It is administered in a hospital or clinic by a specially trained health care professional. Talk to your  pediatrician regarding the use of this medicine in children. While this drug may be prescribed for selected conditions, precautions do apply. Overdosage: If you think you have taken too much of this medicine contact a poison control center or emergency room at once. NOTE: This medicine is only for you. Do not share this medicine with others. What if I miss a dose? It is important not to miss your dose. Call your doctor or health care professional if you are unable to keep an appointment. What may interact with this medication? Interactions have not been studied. Give your health care provider a list of all the medicines, herbs, non-prescription drugs, or dietary supplements you use. Also tell them if you smoke, drink alcohol, or use illegal drugs. Some items may interact with your medicine. This list may not describe all possible interactions. Give your health care provider a list of all the medicines, herbs, non-prescription drugs, or dietary supplements you use. Also tell them if you smoke, drink alcohol, or use illegal drugs. Some items may interact with your medicine. What should I watch for while using this medication? Visit your doctor for checks on your progress. This drug may make you feel generally unwell. This is not uncommon, as chemotherapy can affect healthy cells as well as cancer cells. Report any side effects. Continue your course of treatment even though you feel ill unless your doctor tells you to stop. In some cases, you may be given additional medicines to help with side effects. Follow all directions for their use. Call your doctor or health care professional for advice if you get a fever, chills or sore  throat, or other symptoms of a cold or flu. Do not treat yourself. This drug decreases your body's ability to fight infections. Try to avoid being around people who are sick. This medicine may increase your risk to bruise or bleed. Call your doctor or health care professional if you  notice any unusual bleeding. You may need blood work done while you are taking this medicine. Do not become pregnant while taking this medicine and for 6 months after the last dose. Women should inform their doctor if they wish to become pregnant or think they might be pregnant. Men should not father a child while taking this medicine and for 3 months after the last dose. There is a potential for serious side effects to an unborn child. Talk to your health care professional or pharmacist for more information. Do not breast-feed an infant while taking this medicine and for 1 week after the last dose. This medicine may interfere with the ability to have a child. Talk with your doctor or health care professional if you are concerned about your fertility. What side effects may I notice from receiving this medication? Side effects that you should report to your doctor or health care professional as soon as possible: allergic reactions like skin rash, itching or hives, swelling of the face, lips, or tongue low blood counts - this medicine may decrease the number of white blood cells, red blood cells and platelets. You may be at increased risk for infections and bleeding. signs of infection - fever or chills, cough, sore throat, pain passing urine signs of decreased platelets or bleeding - bruising, pinpoint red spots on the skin, black, tarry stools, blood in the urine signs of decreased red blood cells - unusually weak or tired, fainting spells, lightheadedness signs and symptoms of kidney injury like trouble passing urine or change in the amount of urine signs and symptoms of liver injury like dark yellow or brown urine; general ill feeling or flu-like symptoms; light-colored stools; loss of appetite; nausea; right upper belly pain; unusually weak or tired; yellowing of the eyes or skin Side effects that usually do not require medical attention (report to your doctor or health care professional if they  continue or are bothersome): constipation diarrhea nausea, vomiting pain or redness at the injection site unusually weak or tired This list may not describe all possible side effects. Call your doctor for medical advice about side effects. You may report side effects to FDA at 1-800-FDA-1088. Where should I keep my medication? This drug is given in a hospital or clinic and will not be stored at home. NOTE: This sheet is a summary. It may not cover all possible information. If you have questions about this medicine, talk to your doctor, pharmacist, or health care provider.  2022 Elsevier/Gold Standard (2016-06-17 00:00:00)     To help prevent nausea and vomiting after your treatment, we encourage you to take your nausea medication as directed.  BELOW ARE SYMPTOMS THAT SHOULD BE REPORTED IMMEDIATELY: *FEVER GREATER THAN 100.4 F (38 C) OR HIGHER *CHILLS OR SWEATING *NAUSEA AND VOMITING THAT IS NOT CONTROLLED WITH YOUR NAUSEA MEDICATION *UNUSUAL SHORTNESS OF BREATH *UNUSUAL BRUISING OR BLEEDING *URINARY PROBLEMS (pain or burning when urinating, or frequent urination) *BOWEL PROBLEMS (unusual diarrhea, constipation, pain near the anus) TENDERNESS IN MOUTH AND THROAT WITH OR WITHOUT PRESENCE OF ULCERS (sore throat, sores in mouth, or a toothache) UNUSUAL RASH, SWELLING OR PAIN  UNUSUAL VAGINAL DISCHARGE OR ITCHING   Items with * indicate a  potential emergency and should be followed up as soon as possible or go to the Emergency Department if any problems should occur.  Please show the CHEMOTHERAPY ALERT CARD or IMMUNOTHERAPY ALERT CARD at check-in to the Emergency Department and triage nurse.  Should you have questions after your visit or need to cancel or reschedule your appointment, please contact [facility]  Dept: [phone]  and follow the prompts.  Office hours are 8:00 a.m. to 4:30 p.m. Monday - Friday. Please note that voicemails left after 4:00 p.m.  may not be returned until the following business day.  We are closed weekends and major holidays. You have access to a nurse at all times for urgent questions. Please call the main number to the clinic Dept: [phone] and follow the prompts.   For any non-urgent questions, you may also contact your provider using MyChart. We now offer e-Visits for anyone 17 and older to request care online for non-urgent symptoms. For details visit [email or web address].   Also download the MyChart app! Go to the app store, search "MyChart", open the app, select [facility], and log in with your MyChart username and password.  Due to Covid, a mask is required upon entering the hospital/clinic. If you do not have a mask, one will be given to you upon arrival. For doctor visits, patients may have 1 support person aged 79 or older with them. For treatment visits, patients cannot have anyone with them due to current Covid guidelines and our immunocompromised population.

## 2021-07-15 ENCOUNTER — Inpatient Hospital Stay: Payer: Medicare Other

## 2021-07-15 ENCOUNTER — Encounter: Payer: Self-pay | Admitting: Oncology

## 2021-07-15 ENCOUNTER — Other Ambulatory Visit: Payer: Self-pay | Admitting: *Deleted

## 2021-07-15 VITALS — BP 103/54 | HR 60 | Temp 98.2°F | Resp 20

## 2021-07-15 DIAGNOSIS — Z5111 Encounter for antineoplastic chemotherapy: Secondary | ICD-10-CM | POA: Diagnosis not present

## 2021-07-15 DIAGNOSIS — C9200 Acute myeloblastic leukemia, not having achieved remission: Secondary | ICD-10-CM

## 2021-07-15 DIAGNOSIS — D696 Thrombocytopenia, unspecified: Secondary | ICD-10-CM

## 2021-07-15 MED ORDER — AZACITIDINE CHEMO SQ INJECTION
50.0000 mg/m2 | Freq: Once | INTRAMUSCULAR | Status: AC
  Administered 2021-07-15: 92.5 mg via SUBCUTANEOUS
  Filled 2021-07-15: qty 3.7

## 2021-07-15 MED ORDER — ONDANSETRON HCL 8 MG PO TABS
8.0000 mg | ORAL_TABLET | Freq: Once | ORAL | Status: AC
  Administered 2021-07-15: 8 mg via ORAL
  Filled 2021-07-15: qty 1

## 2021-07-15 NOTE — Progress Notes (Signed)
Patient presents for treatment. RN assessment completed along with the following:  Labs/vitals reviewed - Yes, and within treatment parameters.   Weight within 10% of previous measurement - Yes Informed consent completed and reflects current therapy/intent - Yes, on date 06/16/2021             Provider progress note reviewed - Patient not seen by provider today. Most recent note dated 07/14/2021 reviewed. Treatment/Antibody/Supportive plan reviewed - Yes, and there are no adjustments needed for today's treatment. S&H and other orders reviewed - Yes, and there are no additional orders identified. Previous treatment date reviewed - Yes, and the appropriate amount of time has elapsed between treatments. Clinic Hand Off Received from - No  Patient to proceed with treatment.

## 2021-07-15 NOTE — Progress Notes (Signed)
Referral placed to social work for services available

## 2021-07-15 NOTE — Patient Instructions (Signed)
[facility]   Discharge Instructions: Thank you for choosing [facility] to provide your oncology and hematology care.   If you have a lab appointment with the [facility], please go directly to the [facility] and check in at the registration area.   Wear comfortable clothing and clothing appropriate for easy access to any Portacath or PICC line.   We strive to give you quality time with your provider. You may need to reschedule your appointment if you arrive late (15 or more minutes).  Arriving late affects you and other patients whose appointments are after yours.  Also, if you miss three or more appointments without notifying the office, you may be dismissed from the clinic at the providers discretion.      For prescription refill requests, have your pharmacy contact our office and allow 72 hours for refills to be completed.    Today you received the following chemotherapy and/or immunotherapy agents Azacitidine (VIDAZA).      To help prevent nausea and vomiting after your treatment, we encourage you to take your nausea medication as directed.  BELOW ARE SYMPTOMS THAT SHOULD BE REPORTED IMMEDIATELY: *FEVER GREATER THAN 100.4 F (38 C) OR HIGHER *CHILLS OR SWEATING *NAUSEA AND VOMITING THAT IS NOT CONTROLLED WITH YOUR NAUSEA MEDICATION *UNUSUAL SHORTNESS OF BREATH *UNUSUAL BRUISING OR BLEEDING *URINARY PROBLEMS (pain or burning when urinating, or frequent urination) *BOWEL PROBLEMS (unusual diarrhea, constipation, pain near the anus) TENDERNESS IN MOUTH AND THROAT WITH OR WITHOUT PRESENCE OF ULCERS (sore throat, sores in mouth, or a toothache) UNUSUAL RASH, SWELLING OR PAIN  UNUSUAL VAGINAL DISCHARGE OR ITCHING   Items with * indicate a potential emergency and should be followed up as soon as possible or go to the Emergency Department if any problems should occur.  Please show the CHEMOTHERAPY ALERT CARD or IMMUNOTHERAPY ALERT CARD at  check-in to the Emergency Department and triage nurse.  Should you have questions after your visit or need to cancel or reschedule your appointment, please contact [facility]  Dept: [phone]  and follow the prompts.  Office hours are 8:00 a.m. to 4:30 p.m. Monday - Friday. Please note that voicemails left after 4:00 p.m. may not be returned until the following business day.  We are closed weekends and major holidays. You have access to a nurse at all times for urgent questions. Please call the main number to the clinic Dept: [phone] and follow the prompts.   For any non-urgent questions, you may also contact your provider using MyChart. We now offer e-Visits for anyone 31 and older to request care online for non-urgent symptoms. For details visit [email or web address].   Also download the MyChart app! Go to the app store, search "MyChart", open the app, select [facility], and log in with your MyChart username and password.  Due to Covid, a mask is required upon entering the hospital/clinic. If you do not have a mask, one will be given to you upon arrival. For doctor visits, patients may have 1 support person aged 28 or older with them. For treatment visits, patients cannot have anyone with them due to current Covid guidelines and our immunocompromised population.   Azacitidine suspension for injection (subcutaneous use) What is this medication? AZACITIDINE (ay [place]) is a chemotherapy drug. This medicine reduces the growth of cancer cells and can suppress the immune system. It is used for treating myelodysplastic syndrome or some types of leukemia. This  medicine may be used for other purposes; ask your health care provider or pharmacist if you have questions. COMMON BRAND NAME(S): Vidaza What should I tell my care team before I take this medication? They need to know if you have any of these conditions: kidney disease liver disease liver tumors an  unusual or allergic reaction to azacitidine, mannitol, other medicines, foods, dyes, or preservatives pregnant or trying to get pregnant breast-feeding How should I use this medication? This medicine is for injection under the skin. It is administered in a hospital or clinic by a specially trained health care professional. Talk to your pediatrician regarding the use of this medicine in children. While this drug may be prescribed for selected conditions, precautions do apply. Overdosage: If you think you have taken too much of this medicine contact a poison control center or emergency room at once. NOTE: This medicine is only for you. Do not share this medicine with others. What if I miss a dose? It is important not to miss your dose. Call your doctor or health care professional if you are unable to keep an appointment. What may interact with this medication? Interactions have not been studied. Give your health care provider a list of all the medicines, herbs, non-prescription drugs, or dietary supplements you use. Also tell them if you smoke, drink alcohol, or use illegal drugs. Some items may interact with your medicine. This list may not describe all possible interactions. Give your health care provider a list of all the medicines, herbs, non-prescription drugs, or dietary supplements you use. Also tell them if you smoke, drink alcohol, or use illegal drugs. Some items may interact with your medicine. What should I watch for while using this medication? Visit your doctor for checks on your progress. This drug may make you feel generally unwell. This is not uncommon, as chemotherapy can affect healthy cells as well as cancer cells. Report any side effects. Continue your course of treatment even though you feel ill unless your doctor tells you to stop. In some cases, you may be given additional medicines to help with side effects. Follow all directions for their use. Call your doctor or health care  professional for advice if you get a fever, chills or sore throat, or other symptoms of a cold or flu. Do not treat yourself. This drug decreases your body's ability to fight infections. Try to avoid being around people who are sick. This medicine may increase your risk to bruise or bleed. Call your doctor or health care professional if you notice any unusual bleeding. You may need blood work done while you are taking this medicine. Do not become pregnant while taking this medicine and for 6 months after the last dose. Women should inform their doctor if they wish to become pregnant or think they might be pregnant. Men should not father a child while taking this medicine and for 3 months after the last dose. There is a potential for serious side effects to an unborn child. Talk to your health care professional or pharmacist for more information. Do not breast-feed an infant while taking this medicine and for 1 week after the last dose. This medicine may interfere with the ability to have a child. Talk with your doctor or health care professional if you are concerned about your fertility. What side effects may I notice from receiving this medication? Side effects that you should report to your doctor or health care professional as soon as possible: allergic reactions like skin rash,  itching or hives, swelling of the face, lips, or tongue low blood counts - this medicine may decrease the number of white blood cells, red blood cells and platelets. You may be at increased risk for infections and bleeding. signs of infection - fever or chills, cough, sore throat, pain passing urine signs of decreased platelets or bleeding - bruising, pinpoint red spots on the skin, black, tarry stools, blood in the urine signs of decreased red blood cells - unusually weak or tired, fainting spells, lightheadedness signs and symptoms of kidney injury like trouble passing urine or change in the amount of urine signs and  symptoms of liver injury like dark yellow or brown urine; general ill feeling or flu-like symptoms; light-colored stools; loss of appetite; nausea; right upper belly pain; unusually weak or tired; yellowing of the eyes or skin Side effects that usually do not require medical attention (report to your doctor or health care professional if they continue or are bothersome): constipation diarrhea nausea, vomiting pain or redness at the injection site unusually weak or tired This list may not describe all possible side effects. Call your doctor for medical advice about side effects. You may report side effects to FDA at 1-800-FDA-1088. Where should I keep my medication? This drug is given in a hospital or clinic and will not be stored at home. NOTE: This sheet is a summary. It may not cover all possible information. If you have questions about this medicine, talk to your doctor, pharmacist, or health care provider.  2022 Elsevier/Gold Standard (2016-06-17 00:00:00)

## 2021-07-16 ENCOUNTER — Encounter: Payer: Self-pay | Admitting: Licensed Clinical Social Worker

## 2021-07-16 ENCOUNTER — Inpatient Hospital Stay: Payer: Medicare Other

## 2021-07-16 ENCOUNTER — Other Ambulatory Visit: Payer: Self-pay | Admitting: Nurse Practitioner

## 2021-07-16 ENCOUNTER — Other Ambulatory Visit: Payer: Self-pay

## 2021-07-16 VITALS — BP 120/70 | HR 83 | Temp 97.8°F | Resp 20

## 2021-07-16 DIAGNOSIS — Z5111 Encounter for antineoplastic chemotherapy: Secondary | ICD-10-CM | POA: Diagnosis not present

## 2021-07-16 DIAGNOSIS — D696 Thrombocytopenia, unspecified: Secondary | ICD-10-CM

## 2021-07-16 DIAGNOSIS — C9200 Acute myeloblastic leukemia, not having achieved remission: Secondary | ICD-10-CM

## 2021-07-16 MED ORDER — ONDANSETRON HCL 8 MG PO TABS
8.0000 mg | ORAL_TABLET | Freq: Once | ORAL | Status: AC
  Administered 2021-07-16: 8 mg via ORAL
  Filled 2021-07-16: qty 1

## 2021-07-16 MED ORDER — AZACITIDINE CHEMO SQ INJECTION
50.0000 mg/m2 | Freq: Once | INTRAMUSCULAR | Status: AC
  Administered 2021-07-16: 92.5 mg via SUBCUTANEOUS
  Filled 2021-07-16: qty 3.7

## 2021-07-16 NOTE — Progress Notes (Signed)
Patient presents for treatment. RN assessment completed along with the following:  Labs/vitals reviewed - Yes, and within treatment parameters.   Weight within 10% of previous measurement - Yes Informed consent completed and reflects current therapy/intent - Yes, on date 06/16/21             Provider progress note reviewed - Patient not seen by provider today. Most recent note dated 07/14/21 reviewed. Treatment/Antibody/Supportive plan reviewed - Yes, and there are no adjustments needed for today's treatment. S&H and other orders reviewed - Yes, and there are no additional orders identified. Previous treatment date reviewed - Yes, and the appropriate amount of time has elapsed between treatments.  Patient to proceed with treatment.

## 2021-07-16 NOTE — Patient Instructions (Signed)
[facility]   Discharge Instructions: Thank you for choosing [facility] to provide your oncology and hematology care.   If you have a lab appointment with the [facility], please go directly to the [facility] and check in at the registration area.   Wear comfortable clothing and clothing appropriate for easy access to any Portacath or PICC line.   We strive to give you quality time with your provider. You may need to reschedule your appointment if you arrive late (15 or more minutes).  Arriving late affects you and other patients whose appointments are after yours.  Also, if you miss three or more appointments without notifying the office, you may be dismissed from the clinic at the providers discretion.      For prescription refill requests, have your pharmacy contact our office and allow 72 hours for refills to be completed.    Today you received the following chemotherapy and/or immunotherapy agents Azacitidine (VIDAZA).      To help prevent nausea and vomiting after your treatment, we encourage you to take your nausea medication as directed.  BELOW ARE SYMPTOMS THAT SHOULD BE REPORTED IMMEDIATELY: *FEVER GREATER THAN 100.4 F (38 C) OR HIGHER *CHILLS OR SWEATING *NAUSEA AND VOMITING THAT IS NOT CONTROLLED WITH YOUR NAUSEA MEDICATION *UNUSUAL SHORTNESS OF BREATH *UNUSUAL BRUISING OR BLEEDING *URINARY PROBLEMS (pain or burning when urinating, or frequent urination) *BOWEL PROBLEMS (unusual diarrhea, constipation, pain near the anus) TENDERNESS IN MOUTH AND THROAT WITH OR WITHOUT PRESENCE OF ULCERS (sore throat, sores in mouth, or a toothache) UNUSUAL RASH, SWELLING OR PAIN  UNUSUAL VAGINAL DISCHARGE OR ITCHING   Items with * indicate a potential emergency and should be followed up as soon as possible or go to the Emergency Department if any problems should occur.  Please show the CHEMOTHERAPY ALERT CARD or IMMUNOTHERAPY ALERT CARD at  check-in to the Emergency Department and triage nurse.  Should you have questions after your visit or need to cancel or reschedule your appointment, please contact [facility]  Dept: [phone]  and follow the prompts.  Office hours are 8:00 a.m. to 4:30 p.m. Monday - Friday. Please note that voicemails left after 4:00 p.m. may not be returned until the following business day.  We are closed weekends and major holidays. You have access to a nurse at all times for urgent questions. Please call the main number to the clinic Dept: [phone] and follow the prompts.   For any non-urgent questions, you may also contact your provider using MyChart. We now offer e-Visits for anyone 13 and older to request care online for non-urgent symptoms. For details visit [email or web address].   Also download the MyChart app! Go to the app store, search "MyChart", open the app, select [facility], and log in with your MyChart username and password.  Due to Covid, a mask is required upon entering the hospital/clinic. If you do not have a mask, one will be given to you upon arrival. For doctor visits, patients may have 1 support person aged 3 or older with them. For treatment visits, patients cannot have anyone with them due to current Covid guidelines and our immunocompromised population.   Azacitidine suspension for injection (subcutaneous use) What is this medication? AZACITIDINE (ay [place]) is a chemotherapy drug. This medicine reduces the growth of cancer cells and can suppress the immune system. It is used for treating myelodysplastic syndrome or some types of leukemia. This  medicine may be used for other purposes; ask your health care provider or pharmacist if you have questions. COMMON BRAND NAME(S): Vidaza What should I tell my care team before I take this medication? They need to know if you have any of these conditions: kidney disease liver disease liver tumors an  unusual or allergic reaction to azacitidine, mannitol, other medicines, foods, dyes, or preservatives pregnant or trying to get pregnant breast-feeding How should I use this medication? This medicine is for injection under the skin. It is administered in a hospital or clinic by a specially trained health care professional. Talk to your pediatrician regarding the use of this medicine in children. While this drug may be prescribed for selected conditions, precautions do apply. Overdosage: If you think you have taken too much of this medicine contact a poison control center or emergency room at once. NOTE: This medicine is only for you. Do not share this medicine with others. What if I miss a dose? It is important not to miss your dose. Call your doctor or health care professional if you are unable to keep an appointment. What may interact with this medication? Interactions have not been studied. Give your health care provider a list of all the medicines, herbs, non-prescription drugs, or dietary supplements you use. Also tell them if you smoke, drink alcohol, or use illegal drugs. Some items may interact with your medicine. This list may not describe all possible interactions. Give your health care provider a list of all the medicines, herbs, non-prescription drugs, or dietary supplements you use. Also tell them if you smoke, drink alcohol, or use illegal drugs. Some items may interact with your medicine. What should I watch for while using this medication? Visit your doctor for checks on your progress. This drug may make you feel generally unwell. This is not uncommon, as chemotherapy can affect healthy cells as well as cancer cells. Report any side effects. Continue your course of treatment even though you feel ill unless your doctor tells you to stop. In some cases, you may be given additional medicines to help with side effects. Follow all directions for their use. Call your doctor or health care  professional for advice if you get a fever, chills or sore throat, or other symptoms of a cold or flu. Do not treat yourself. This drug decreases your body's ability to fight infections. Try to avoid being around people who are sick. This medicine may increase your risk to bruise or bleed. Call your doctor or health care professional if you notice any unusual bleeding. You may need blood work done while you are taking this medicine. Do not become pregnant while taking this medicine and for 6 months after the last dose. Women should inform their doctor if they wish to become pregnant or think they might be pregnant. Men should not father a child while taking this medicine and for 3 months after the last dose. There is a potential for serious side effects to an unborn child. Talk to your health care professional or pharmacist for more information. Do not breast-feed an infant while taking this medicine and for 1 week after the last dose. This medicine may interfere with the ability to have a child. Talk with your doctor or health care professional if you are concerned about your fertility. What side effects may I notice from receiving this medication? Side effects that you should report to your doctor or health care professional as soon as possible: allergic reactions like skin rash,  itching or hives, swelling of the face, lips, or tongue low blood counts - this medicine may decrease the number of white blood cells, red blood cells and platelets. You may be at increased risk for infections and bleeding. signs of infection - fever or chills, cough, sore throat, pain passing urine signs of decreased platelets or bleeding - bruising, pinpoint red spots on the skin, black, tarry stools, blood in the urine signs of decreased red blood cells - unusually weak or tired, fainting spells, lightheadedness signs and symptoms of kidney injury like trouble passing urine or change in the amount of urine signs and  symptoms of liver injury like dark yellow or brown urine; general ill feeling or flu-like symptoms; light-colored stools; loss of appetite; nausea; right upper belly pain; unusually weak or tired; yellowing of the eyes or skin Side effects that usually do not require medical attention (report to your doctor or health care professional if they continue or are bothersome): constipation diarrhea nausea, vomiting pain or redness at the injection site unusually weak or tired This list may not describe all possible side effects. Call your doctor for medical advice about side effects. You may report side effects to FDA at 1-800-FDA-1088. Where should I keep my medication? This drug is given in a hospital or clinic and will not be stored at home. NOTE: This sheet is a summary. It may not cover all possible information. If you have questions about this medicine, talk to your doctor, pharmacist, or health care provider.  2022 Elsevier/Gold Standard (2016-06-17 00:00:00)

## 2021-07-16 NOTE — Progress Notes (Signed)
[facility]  Initial Assessment   JAN OLANO is a 76 y.o. year old male contacted by phone. Clinical Social Work was referred by Helaine Chess for assessment of psychosocial needs.   SDOH (Social Determinants of Health) assessments performed: Yes SDOH Interventions    Flowsheet Row Most Recent Value  SDOH Interventions   Food Insecurity Interventions Intervention Not Indicated  Financial Strain Interventions Financial Counselor  Housing Interventions Intervention Not Indicated  Intimate Partner Violence Interventions Intervention Not Indicated  Physical Activity Interventions Intervention Not Indicated  Stress Interventions Intervention Not Indicated  Transportation Interventions Other (Comment)  [Patient uses [employer]]       Distress Screen completed: No ONCBCN DISTRESS SCREENING 09/03/2020  Screening Type Initial Screening  Distress experienced in past week (1-10) 0  Physical Problem type Pain;Sleep/insomnia;Breathing;Changes in urination;Skin dry/itchy  Physician notified of physical symptoms Yes  Referral to clinical psychology No  Referral to clinical social work Yes  Referral to dietition No  Referral to financial advocate Yes  Referral to support programs No  Referral to palliative care No      Family/Social Information:  Housing Arrangement: patient lives with son, daughter-in-law and grandchild [place], [place] Pandora Leiter) [phone]   Family members/support persons in your life? Family Transportation concerns: no, Currently using [employer]. Employment: Retired. Income source: Paediatric nurse concerns:  No immediate financial concerns, but does live on fixed income Type of concern: None Food access concerns: no Religious or spiritual practice: N/A Services Currently in place:  Medicare/palliative following  Coping/ Adjustment to diagnosis: Patient understands treatment plan and  what happens next? yes, but daughter in law assist with understanding, is main caregiver Concerns about diagnosis and/or treatment: I'm not especially worried about anything Patient reported stressors:  Patient did not identify stressors Hopes and priorities: N/A Patient enjoys being outside, watching TV, and time with family/ friends Current coping skills/ strengths: Capable of independent living , Motivation for treatment/growth , Physical Health , and Supportive family/friends     SUMMARY: Current SDOH Barriers:  Financial constraints related to fixed income, doers not qualify for Medicare  Clinical Social Work Clinical Goal(s):  No CSW goals identified.  Patient will contact CSW for any concerns or questions.  Interventions: Discussed common feeling and emotions when being diagnosed with cancer, and the importance of support during treatment Informed patient of the support team roles and support services at [facility] Provided CSW contact information and encouraged patient to call with any questions or concerns  Provided patient with information about CSW role in patient care and resources available.  CSW spoke with patient's daughter in law, April Reindel.  Ms Rowe inquired about medications prescribed to the patient when he was in the ED last month, she wanted to know if they needed to be refilled.  CSW stated I would update RN Navigator of her inquiry.    Follow Up Plan: Patient will contact CSW with any support or resource needs Patient verbalizes understanding of plan: Yes    Dewey Neukam , LCSW

## 2021-07-17 ENCOUNTER — Inpatient Hospital Stay: Payer: Medicare Other

## 2021-07-17 VITALS — BP 119/61 | HR 61 | Temp 97.4°F | Resp 20

## 2021-07-17 DIAGNOSIS — Z5111 Encounter for antineoplastic chemotherapy: Secondary | ICD-10-CM | POA: Diagnosis not present

## 2021-07-17 DIAGNOSIS — C9200 Acute myeloblastic leukemia, not having achieved remission: Secondary | ICD-10-CM

## 2021-07-17 DIAGNOSIS — D696 Thrombocytopenia, unspecified: Secondary | ICD-10-CM

## 2021-07-17 LAB — CBC WITH DIFFERENTIAL (CANCER CENTER ONLY)
Abs Immature Granulocytes: 0.01 10*3/uL (ref 0.00–0.07)
Basophils Absolute: 0 10*3/uL (ref 0.0–0.1)
Basophils Relative: 0 %
Eosinophils Absolute: 0 10*3/uL (ref 0.0–0.5)
Eosinophils Relative: 1 %
HCT: 25 % — ABNORMAL LOW (ref 39.0–52.0)
Hemoglobin: 8 g/dL — ABNORMAL LOW (ref 13.0–17.0)
Immature Granulocytes: 1 %
Lymphocytes Relative: 38 %
Lymphs Abs: 0.5 10*3/uL — ABNORMAL LOW (ref 0.7–4.0)
MCH: 28.1 pg (ref 26.0–34.0)
MCHC: 32 g/dL (ref 30.0–36.0)
MCV: 87.7 fL (ref 80.0–100.0)
Monocytes Absolute: 0.1 10*3/uL (ref 0.1–1.0)
Monocytes Relative: 4 %
Neutro Abs: 0.7 10*3/uL — ABNORMAL LOW (ref 1.7–7.7)
Neutrophils Relative %: 56 %
Platelet Count: 17 10*3/uL — ABNORMAL LOW (ref 150–400)
RBC: 2.85 MIL/uL — ABNORMAL LOW (ref 4.22–5.81)
RDW: 18.3 % — ABNORMAL HIGH (ref 11.5–15.5)
WBC Count: 1.2 10*3/uL — ABNORMAL LOW (ref 4.0–10.5)
nRBC: 0 % (ref 0.0–0.2)

## 2021-07-17 LAB — SAMPLE TO BLOOD BANK

## 2021-07-17 MED ORDER — ONDANSETRON HCL 8 MG PO TABS
8.0000 mg | ORAL_TABLET | Freq: Once | ORAL | Status: AC
  Administered 2021-07-17: 8 mg via ORAL
  Filled 2021-07-17: qty 1

## 2021-07-17 MED ORDER — AZACITIDINE CHEMO SQ INJECTION
50.0000 mg/m2 | Freq: Once | INTRAMUSCULAR | Status: AC
  Administered 2021-07-17: 92.5 mg via SUBCUTANEOUS
  Filled 2021-07-17: qty 3.7

## 2021-07-17 NOTE — Patient Instructions (Signed)
[place]   Discharge Instructions: Thank you for choosing [facility] to provide your oncology and hematology care.   If you have a lab appointment with the [facility], please go directly to the [facility] and check in at the registration area.   Wear comfortable clothing and clothing appropriate for easy access to any Portacath or PICC line.   We strive to give you quality time with your provider. You may need to reschedule your appointment if you arrive late (15 or more minutes).  Arriving late affects you and other patients whose appointments are after yours.  Also, if you miss three or more appointments without notifying the office, you may be dismissed from the clinic at the providers discretion.      For prescription refill requests, have your pharmacy contact our office and allow 72 hours for refills to be completed.    Today you received the following chemotherapy and/or immunotherapy agents Vidaza      To help prevent nausea and vomiting after your treatment, we encourage you to take your nausea medication as directed.  BELOW ARE SYMPTOMS THAT SHOULD BE REPORTED IMMEDIATELY: *FEVER GREATER THAN 100.4 F (38 C) OR HIGHER *CHILLS OR SWEATING *NAUSEA AND VOMITING THAT IS NOT CONTROLLED WITH YOUR NAUSEA MEDICATION *UNUSUAL SHORTNESS OF BREATH *UNUSUAL BRUISING OR BLEEDING *URINARY PROBLEMS (pain or burning when urinating, or frequent urination) *BOWEL PROBLEMS (unusual diarrhea, constipation, pain near the anus) TENDERNESS IN MOUTH AND THROAT WITH OR WITHOUT PRESENCE OF ULCERS (sore throat, sores in mouth, or a toothache) UNUSUAL RASH, SWELLING OR PAIN  UNUSUAL VAGINAL DISCHARGE OR ITCHING   Items with * indicate a potential emergency and should be followed up as soon as possible or go to the Emergency Department if any problems should occur.  Please show the CHEMOTHERAPY ALERT CARD or IMMUNOTHERAPY ALERT CARD at check-in to the  Emergency Department and triage nurse.  Should you have questions after your visit or need to cancel or reschedule your appointment, please contact [facility]  Dept: [phone]  and follow the prompts.  Office hours are 8:00 a.m. to 4:30 p.m. Monday - Friday. Please note that voicemails left after 4:00 p.m. may not be returned until the following business day.  We are closed weekends and major holidays. You have access to a nurse at all times for urgent questions. Please call the main number to the clinic Dept: [phone] and follow the prompts.   For any non-urgent questions, you may also contact your provider using MyChart. We now offer e-Visits for anyone 32 and older to request care online for non-urgent symptoms. For details visit [email or web address].   Also download the MyChart app! Go to the app store, search "MyChart", open the app, select [facility], and log in with your MyChart username and password.  Due to Covid, a mask is required upon entering the hospital/clinic. If you do not have a mask, one will be given to you upon arrival. For doctor visits, patients may have 1 support person aged 33 or older with them. For treatment visits, patients cannot have anyone with them due to current Covid guidelines and our immunocompromised population.   Azacitidine suspension for injection (subcutaneous use) What is this medication? AZACITIDINE (ay [place]) is a chemotherapy drug. This medicine reduces the growth of cancer cells and can suppress the immune system. It is used for treating myelodysplastic syndrome or some types of leukemia. This medicine  may be used for other purposes; ask your health care provider or pharmacist if you have questions. COMMON BRAND NAME(S): Vidaza What should I tell my care team before I take this medication? They need to know if you have any of these conditions: kidney disease liver disease liver tumors an unusual or  allergic reaction to azacitidine, mannitol, other medicines, foods, dyes, or preservatives pregnant or trying to get pregnant breast-feeding How should I use this medication? This medicine is for injection under the skin. It is administered in a hospital or clinic by a specially trained health care professional. Talk to your pediatrician regarding the use of this medicine in children. While this drug may be prescribed for selected conditions, precautions do apply. Overdosage: If you think you have taken too much of this medicine contact a poison control center or emergency room at once. NOTE: This medicine is only for you. Do not share this medicine with others. What if I miss a dose? It is important not to miss your dose. Call your doctor or health care professional if you are unable to keep an appointment. What may interact with this medication? Interactions have not been studied. Give your health care provider a list of all the medicines, herbs, non-prescription drugs, or dietary supplements you use. Also tell them if you smoke, drink alcohol, or use illegal drugs. Some items may interact with your medicine. This list may not describe all possible interactions. Give your health care provider a list of all the medicines, herbs, non-prescription drugs, or dietary supplements you use. Also tell them if you smoke, drink alcohol, or use illegal drugs. Some items may interact with your medicine. What should I watch for while using this medication? Visit your doctor for checks on your progress. This drug may make you feel generally unwell. This is not uncommon, as chemotherapy can affect healthy cells as well as cancer cells. Report any side effects. Continue your course of treatment even though you feel ill unless your doctor tells you to stop. In some cases, you may be given additional medicines to help with side effects. Follow all directions for their use. Call your doctor or health care  professional for advice if you get a fever, chills or sore throat, or other symptoms of a cold or flu. Do not treat yourself. This drug decreases your body's ability to fight infections. Try to avoid being around people who are sick. This medicine may increase your risk to bruise or bleed. Call your doctor or health care professional if you notice any unusual bleeding. You may need blood work done while you are taking this medicine. Do not become pregnant while taking this medicine and for 6 months after the last dose. Women should inform their doctor if they wish to become pregnant or think they might be pregnant. Men should not father a child while taking this medicine and for 3 months after the last dose. There is a potential for serious side effects to an unborn child. Talk to your health care professional or pharmacist for more information. Do not breast-feed an infant while taking this medicine and for 1 week after the last dose. This medicine may interfere with the ability to have a child. Talk with your doctor or health care professional if you are concerned about your fertility. What side effects may I notice from receiving this medication? Side effects that you should report to your doctor or health care professional as soon as possible: allergic reactions like skin rash, itching  or hives, swelling of the face, lips, or tongue low blood counts - this medicine may decrease the number of white blood cells, red blood cells and platelets. You may be at increased risk for infections and bleeding. signs of infection - fever or chills, cough, sore throat, pain passing urine signs of decreased platelets or bleeding - bruising, pinpoint red spots on the skin, black, tarry stools, blood in the urine signs of decreased red blood cells - unusually weak or tired, fainting spells, lightheadedness signs and symptoms of kidney injury like trouble passing urine or change in the amount of urine signs and  symptoms of liver injury like dark yellow or brown urine; general ill feeling or flu-like symptoms; light-colored stools; loss of appetite; nausea; right upper belly pain; unusually weak or tired; yellowing of the eyes or skin Side effects that usually do not require medical attention (report to your doctor or health care professional if they continue or are bothersome): constipation diarrhea nausea, vomiting pain or redness at the injection site unusually weak or tired This list may not describe all possible side effects. Call your doctor for medical advice about side effects. You may report side effects to FDA at 1-800-FDA-1088. Where should I keep my medication? This drug is given in a hospital or clinic and will not be stored at home. NOTE: This sheet is a summary. It may not cover all possible information. If you have questions about this medicine, talk to your doctor, pharmacist, or health care provider.  2022 Elsevier/Gold Standard (2016-06-17 00:00:00)

## 2021-07-17 NOTE — Progress Notes (Signed)
Patient presents for treatment. RN assessment completed along with the following:  Labs/vitals reviewed - Yes, and within treatment parameters.   Weight within 10% of previous measurement - Yes Informed consent completed and reflects current therapy/intent - Yes, on date 06/16/21             Provider progress note reviewed -  2 /13/23  Treatment/Antibody/Supportive plan reviewed - Yes, and there are no adjustments needed for today's treatment. S&H and other orders reviewed - Yes, and there are no additional orders identified. Previous treatment date reviewed - Yes, and the appropriate amount of time has elapsed between treatments. Clinic Hand Off Received from - OK to treat from Leander Rams, NP  Patient to proceed with treatment.

## 2021-07-18 ENCOUNTER — Other Ambulatory Visit: Payer: Self-pay

## 2021-07-18 ENCOUNTER — Inpatient Hospital Stay: Payer: Medicare Other

## 2021-07-18 ENCOUNTER — Encounter: Payer: Self-pay | Admitting: Oncology

## 2021-07-18 VITALS — BP 118/51 | HR 60 | Temp 98.0°F | Resp 20

## 2021-07-18 DIAGNOSIS — Z5111 Encounter for antineoplastic chemotherapy: Secondary | ICD-10-CM | POA: Diagnosis not present

## 2021-07-18 DIAGNOSIS — D696 Thrombocytopenia, unspecified: Secondary | ICD-10-CM

## 2021-07-18 DIAGNOSIS — C9200 Acute myeloblastic leukemia, not having achieved remission: Secondary | ICD-10-CM

## 2021-07-18 MED ORDER — ONDANSETRON HCL 8 MG PO TABS
8.0000 mg | ORAL_TABLET | Freq: Once | ORAL | Status: AC
  Administered 2021-07-18: 8 mg via ORAL
  Filled 2021-07-18: qty 1

## 2021-07-18 MED ORDER — AZACITIDINE CHEMO SQ INJECTION
50.0000 mg/m2 | Freq: Once | INTRAMUSCULAR | Status: AC
  Administered 2021-07-18: 92.5 mg via SUBCUTANEOUS
  Filled 2021-07-18: qty 3.7

## 2021-07-18 NOTE — Progress Notes (Signed)
Patient presents for treatment. RN assessment completed along with the following:  Labs/vitals reviewed - Yes, and within treatment parameters.   Weight within 10% of previous measurement - Yes Informed consent completed and reflects current therapy/intent - Yes, on date 06/16/21             Provider progress note reviewed - Patient not seen by provider today. Most recent note dated 07/14/21 reviewed. Treatment/Antibody/Supportive plan reviewed - Yes, and there are no adjustments needed for today's treatment. S&H and other orders reviewed - Yes, and there are no additional orders identified. Previous treatment date reviewed - Yes, and the appropriate amount of time has elapsed between treatments.   Patient to proceed with treatment.

## 2021-07-18 NOTE — Patient Instructions (Signed)
[facility]   Discharge Instructions: Thank you for choosing [facility] to provide your oncology and hematology care.   If you have a lab appointment with the [facility], please go directly to the [facility] and check in at the registration area.   Wear comfortable clothing and clothing appropriate for easy access to any Portacath or PICC line.   We strive to give you quality time with your provider. You may need to reschedule your appointment if you arrive late (15 or more minutes).  Arriving late affects you and other patients whose appointments are after yours.  Also, if you miss three or more appointments without notifying the office, you may be dismissed from the clinic at the providers discretion.      For prescription refill requests, have your pharmacy contact our office and allow 72 hours for refills to be completed.    Today you received the following chemotherapy and/or immunotherapy agents Vidaza      To help prevent nausea and vomiting after your treatment, we encourage you to take your nausea medication as directed.  BELOW ARE SYMPTOMS THAT SHOULD BE REPORTED IMMEDIATELY: *FEVER GREATER THAN 100.4 F (38 C) OR HIGHER *CHILLS OR SWEATING *NAUSEA AND VOMITING THAT IS NOT CONTROLLED WITH YOUR NAUSEA MEDICATION *UNUSUAL SHORTNESS OF BREATH *UNUSUAL BRUISING OR BLEEDING *URINARY PROBLEMS (pain or burning when urinating, or frequent urination) *BOWEL PROBLEMS (unusual diarrhea, constipation, pain near the anus) TENDERNESS IN MOUTH AND THROAT WITH OR WITHOUT PRESENCE OF ULCERS (sore throat, sores in mouth, or a toothache) UNUSUAL RASH, SWELLING OR PAIN  UNUSUAL VAGINAL DISCHARGE OR ITCHING   Items with * indicate a potential emergency and should be followed up as soon as possible or go to the Emergency Department if any problems should occur.  Please show the CHEMOTHERAPY ALERT CARD or IMMUNOTHERAPY ALERT CARD at check-in to the  Emergency Department and triage nurse.  Should you have questions after your visit or need to cancel or reschedule your appointment, please contact [facility]  Dept: [phone]  and follow the prompts.  Office hours are 8:00 a.m. to 4:30 p.m. Monday - Friday. Please note that voicemails left after 4:00 p.m. may not be returned until the following business day.  We are closed weekends and major holidays. You have access to a nurse at all times for urgent questions. Please call the main number to the clinic Dept: [phone] and follow the prompts.   For any non-urgent questions, you may also contact your provider using MyChart. We now offer e-Visits for anyone 30 and older to request care online for non-urgent symptoms. For details visit [email or web address].   Also download the MyChart app! Go to the app store, search "MyChart", open the app, select , and log in with your MyChart username and password.  Due to Covid, a mask is required upon entering the hospital/clinic. If you do not have a mask, one will be given to you upon arrival. For doctor visits, patients may have 1 support person aged 50 or older with them. For treatment visits, patients cannot have anyone with them due to current Covid guidelines and our immunocompromised population.   Azacitidine suspension for injection (subcutaneous use) What is this medication? AZACITIDINE (ay [place]) is a chemotherapy drug. This medicine reduces the growth of cancer cells and can suppress the immune system. It is used for treating myelodysplastic syndrome or some types of leukemia. This medicine  may be used for other purposes; ask your health care provider or pharmacist if you have questions. COMMON BRAND NAME(S): Vidaza What should I tell my care team before I take this medication? They need to know if you have any of these conditions: kidney disease liver disease liver tumors an unusual or  allergic reaction to azacitidine, mannitol, other medicines, foods, dyes, or preservatives pregnant or trying to get pregnant breast-feeding How should I use this medication? This medicine is for injection under the skin. It is administered in a hospital or clinic by a specially trained health care professional. Talk to your pediatrician regarding the use of this medicine in children. While this drug may be prescribed for selected conditions, precautions do apply. Overdosage: If you think you have taken too much of this medicine contact a poison control center or emergency room at once. NOTE: This medicine is only for you. Do not share this medicine with others. What if I miss a dose? It is important not to miss your dose. Call your doctor or health care professional if you are unable to keep an appointment. What may interact with this medication? Interactions have not been studied. Give your health care provider a list of all the medicines, herbs, non-prescription drugs, or dietary supplements you use. Also tell them if you smoke, drink alcohol, or use illegal drugs. Some items may interact with your medicine. This list may not describe all possible interactions. Give your health care provider a list of all the medicines, herbs, non-prescription drugs, or dietary supplements you use. Also tell them if you smoke, drink alcohol, or use illegal drugs. Some items may interact with your medicine. What should I watch for while using this medication? Visit your doctor for checks on your progress. This drug may make you feel generally unwell. This is not uncommon, as chemotherapy can affect healthy cells as well as cancer cells. Report any side effects. Continue your course of treatment even though you feel ill unless your doctor tells you to stop. In some cases, you may be given additional medicines to help with side effects. Follow all directions for their use. Call your doctor or health care  professional for advice if you get a fever, chills or sore throat, or other symptoms of a cold or flu. Do not treat yourself. This drug decreases your body's ability to fight infections. Try to avoid being around people who are sick. This medicine may increase your risk to bruise or bleed. Call your doctor or health care professional if you notice any unusual bleeding. You may need blood work done while you are taking this medicine. Do not become pregnant while taking this medicine and for 6 months after the last dose. Women should inform their doctor if they wish to become pregnant or think they might be pregnant. Men should not father a child while taking this medicine and for 3 months after the last dose. There is a potential for serious side effects to an unborn child. Talk to your health care professional or pharmacist for more information. Do not breast-feed an infant while taking this medicine and for 1 week after the last dose. This medicine may interfere with the ability to have a child. Talk with your doctor or health care professional if you are concerned about your fertility. What side effects may I notice from receiving this medication? Side effects that you should report to your doctor or health care professional as soon as possible: allergic reactions like skin rash, itching  or hives, swelling of the face, lips, or tongue low blood counts - this medicine may decrease the number of white blood cells, red blood cells and platelets. You may be at increased risk for infections and bleeding. signs of infection - fever or chills, cough, sore throat, pain passing urine signs of decreased platelets or bleeding - bruising, pinpoint red spots on the skin, black, tarry stools, blood in the urine signs of decreased red blood cells - unusually weak or tired, fainting spells, lightheadedness signs and symptoms of kidney injury like trouble passing urine or change in the amount of urine signs and  symptoms of liver injury like dark yellow or brown urine; general ill feeling or flu-like symptoms; light-colored stools; loss of appetite; nausea; right upper belly pain; unusually weak or tired; yellowing of the eyes or skin Side effects that usually do not require medical attention (report to your doctor or health care professional if they continue or are bothersome): constipation diarrhea nausea, vomiting pain or redness at the injection site unusually weak or tired This list may not describe all possible side effects. Call your doctor for medical advice about side effects. You may report side effects to FDA at 1-800-FDA-1088. Where should I keep my medication? This drug is given in a hospital or clinic and will not be stored at home. NOTE: This sheet is a summary. It may not cover all possible information. If you have questions about this medicine, talk to your doctor, pharmacist, or health care provider.  2022 Elsevier/Gold Standard (2016-06-17 00:00:00)

## 2021-07-21 ENCOUNTER — Inpatient Hospital Stay: Payer: Medicare Other

## 2021-07-21 ENCOUNTER — Other Ambulatory Visit: Payer: Self-pay

## 2021-07-21 DIAGNOSIS — C9200 Acute myeloblastic leukemia, not having achieved remission: Secondary | ICD-10-CM

## 2021-07-21 DIAGNOSIS — Z5111 Encounter for antineoplastic chemotherapy: Secondary | ICD-10-CM | POA: Diagnosis not present

## 2021-07-21 LAB — CMP (CANCER CENTER ONLY)
ALT: 24 U/L (ref 0–44)
AST: 17 U/L (ref 15–41)
Albumin: 3.8 g/dL (ref 3.5–5.0)
Alkaline Phosphatase: 69 U/L (ref 38–126)
Anion gap: 6 (ref 5–15)
BUN: 19 mg/dL (ref 8–23)
CO2: 28 mmol/L (ref 22–32)
Calcium: 9.4 mg/dL (ref 8.9–10.3)
Chloride: 104 mmol/L (ref 98–111)
Creatinine: 1 mg/dL (ref 0.61–1.24)
GFR, Estimated: >60 mL/min (ref >60)
Glucose, Bld: 90 mg/dL (ref 70–99)
Potassium: 4.5 mmol/L (ref 3.5–5.1)
Sodium: 138 mmol/L (ref 135–145)
Total Bilirubin: 1.3 mg/dL — ABNORMAL HIGH (ref 0.3–1.2)
Total Protein: 7.1 g/dL (ref 6.5–8.1)

## 2021-07-21 LAB — CBC WITH DIFFERENTIAL (CANCER CENTER ONLY)
Abs Immature Granulocytes: 0.01 10*3/uL (ref 0.00–0.07)
Basophils Absolute: 0 10*3/uL (ref 0.0–0.1)
Basophils Relative: 1 %
Eosinophils Absolute: 0 10*3/uL (ref 0.0–0.5)
Eosinophils Relative: 1 %
HCT: 24.6 % — ABNORMAL LOW (ref 39.0–52.0)
Hemoglobin: 8 g/dL — ABNORMAL LOW (ref 13.0–17.0)
Immature Granulocytes: 1 %
Lymphocytes Relative: 34 %
Lymphs Abs: 0.5 10*3/uL — ABNORMAL LOW (ref 0.7–4.0)
MCH: 28.5 pg (ref 26.0–34.0)
MCHC: 32.5 g/dL (ref 30.0–36.0)
MCV: 87.5 fL (ref 80.0–100.0)
Monocytes Absolute: 0.1 10*3/uL (ref 0.1–1.0)
Monocytes Relative: 4 %
Neutro Abs: 0.9 10*3/uL — ABNORMAL LOW (ref 1.7–7.7)
Neutrophils Relative %: 59 %
Platelet Count: 13 10*3/uL — ABNORMAL LOW (ref 150–400)
RBC: 2.81 MIL/uL — ABNORMAL LOW (ref 4.22–5.81)
RDW: 18.3 % — ABNORMAL HIGH (ref 11.5–15.5)
WBC Count: 1.4 10*3/uL — ABNORMAL LOW (ref 4.0–10.5)
nRBC: 0 % (ref 0.0–0.2)

## 2021-07-21 LAB — SAMPLE TO BLOOD BANK

## 2021-07-21 MED ORDER — PEGFILGRASTIM-BMEZ 6 MG/0.6ML ~~LOC~~ SOSY
6.0000 mg | PREFILLED_SYRINGE | Freq: Once | SUBCUTANEOUS | Status: AC
  Administered 2021-07-21: 6 mg via SUBCUTANEOUS
  Filled 2021-07-21: qty 0.6

## 2021-07-21 NOTE — Patient Instructions (Signed)

## 2021-07-24 ENCOUNTER — Encounter: Payer: Self-pay | Admitting: *Deleted

## 2021-07-24 ENCOUNTER — Telehealth: Payer: Self-pay

## 2021-07-24 ENCOUNTER — Inpatient Hospital Stay: Payer: Medicare Other

## 2021-07-24 ENCOUNTER — Other Ambulatory Visit: Payer: Self-pay | Admitting: Oncology

## 2021-07-24 ENCOUNTER — Other Ambulatory Visit: Payer: Self-pay | Admitting: Family Medicine

## 2021-07-24 ENCOUNTER — Other Ambulatory Visit (HOSPITAL_COMMUNITY): Payer: Self-pay

## 2021-07-24 ENCOUNTER — Other Ambulatory Visit: Payer: Self-pay

## 2021-07-24 DIAGNOSIS — C9200 Acute myeloblastic leukemia, not having achieved remission: Secondary | ICD-10-CM

## 2021-07-24 DIAGNOSIS — Z5111 Encounter for antineoplastic chemotherapy: Secondary | ICD-10-CM | POA: Diagnosis not present

## 2021-07-24 LAB — CBC WITH DIFFERENTIAL (CANCER CENTER ONLY)
Abs Immature Granulocytes: 0.1 10*3/uL — ABNORMAL HIGH (ref 0.00–0.07)
Band Neutrophils: 7 %
Basophils Absolute: 0 10*3/uL (ref 0.0–0.1)
Basophils Relative: 0 %
Eosinophils Absolute: 0 10*3/uL (ref 0.0–0.5)
Eosinophils Relative: 0 %
HCT: 23.3 % — ABNORMAL LOW (ref 39.0–52.0)
Hemoglobin: 7.6 g/dL — ABNORMAL LOW (ref 13.0–17.0)
Lymphocytes Relative: 19 %
Lymphs Abs: 0.4 10*3/uL — ABNORMAL LOW (ref 0.7–4.0)
MCH: 28.7 pg (ref 26.0–34.0)
MCHC: 32.6 g/dL (ref 30.0–36.0)
MCV: 87.9 fL (ref 80.0–100.0)
Metamyelocytes Relative: 4 %
Monocytes Absolute: 0.1 10*3/uL (ref 0.1–1.0)
Monocytes Relative: 6 %
Neutro Abs: 1.4 10*3/uL — ABNORMAL LOW (ref 1.7–7.7)
Neutrophils Relative %: 64 %
Platelet Count: 6 10*3/uL — CL (ref 150–400)
RBC: 2.65 MIL/uL — ABNORMAL LOW (ref 4.22–5.81)
RDW: 18.8 % — ABNORMAL HIGH (ref 11.5–15.5)
Smear Review: NORMAL
WBC Count: 2 10*3/uL — ABNORMAL LOW (ref 4.0–10.5)
nRBC: 0 % (ref 0.0–0.2)
nRBC: 1 /100 WBC — ABNORMAL HIGH

## 2021-07-24 LAB — PREPARE RBC (CROSSMATCH)

## 2021-07-24 MED ORDER — ROSUVASTATIN CALCIUM 20 MG PO TABS: 20.0000 mg | ORAL_TABLET | Freq: Every day | ORAL | 3 refills | Status: AC

## 2021-07-24 MED ORDER — DIGOXIN 125 MCG PO TABS: 0.1250 mg | ORAL_TABLET | Freq: Every day | ORAL | 3 refills | Status: AC

## 2021-07-24 NOTE — Progress Notes (Unsigned)
CRITICAL VALUE STICKER  CRITICAL VALUE: Platelets 6,000  RECEIVER (on-site recipient of call):Kmari Brian,RN  DATE & TIME NOTIFIED: 15  MESSENGER (representative from lab): Silva Bandy  MD NOTIFIED: Ned Card, NP  TIME OF NOTIFICATION: [phone]  RESPONSE: Return to clinic today for  1 unit platelets.

## 2021-07-24 NOTE — Patient Instructions (Signed)

## 2021-07-24 NOTE — Telephone Encounter (Signed)
[facility] request a refill for Odomzo. The request will be put in by the provider.

## 2021-07-25 ENCOUNTER — Inpatient Hospital Stay: Payer: Medicare Other

## 2021-07-25 DIAGNOSIS — Z5111 Encounter for antineoplastic chemotherapy: Secondary | ICD-10-CM | POA: Diagnosis not present

## 2021-07-25 DIAGNOSIS — C9200 Acute myeloblastic leukemia, not having achieved remission: Secondary | ICD-10-CM

## 2021-07-25 LAB — PREPARE PLATELET PHERESIS: Unit division: 0

## 2021-07-25 LAB — BPAM PLATELET PHERESIS
Blood Product Expiration Date: 202302242359
ISSUE DATE / TIME: 202302231006
Unit Type and Rh: 6200

## 2021-07-25 MED ORDER — SODIUM CHLORIDE 0.9% IV SOLUTION
250.0000 mL | Freq: Once | INTRAVENOUS | Status: AC
  Administered 2021-07-25: 250 mL via INTRAVENOUS

## 2021-07-25 NOTE — Patient Instructions (Signed)
Blood Transfusion, Adult, Care After This sheet gives you information about how to care for yourself after your procedure. Your doctor may also give you more specific instructions. If you have problems or questions, contact your doctor. What can I expect after the procedure? After the procedure, it is common to have: Bruising and soreness at the IV site. A headache. Follow these instructions at home: Insertion site care   Follow instructions from your doctor about how to take care of your insertion site. This is where an IV tube was put into your vein. Make sure you: Wash your hands with soap and water before and after you change your bandage (dressing). If you cannot use soap and water, use hand sanitizer. Change your bandage as told by your doctor. Check your insertion site every day for signs of infection. Check for: Redness, swelling, or pain. Bleeding from the site. Warmth. Pus or a bad smell. General instructions Take over-the-counter and prescription medicines only as told by your doctor. Rest as told by your doctor. Go back to your normal activities as told by your doctor. Keep all follow-up visits as told by your doctor. This is important. Contact a doctor if: You have itching or red, swollen areas of skin (hives). You feel worried or nervous (anxious). You feel weak after doing your normal activities. You have redness, swelling, warmth, or pain around the insertion site. You have blood coming from the insertion site, and the blood does not stop with pressure. You have pus or a bad smell coming from the insertion site. Get help right away if: You have signs of a serious reaction. This may be coming from an allergy or the body's defense system (immune system). Signs include: Trouble breathing or shortness of breath. Swelling of the face or feeling warm (flushed). Fever or chills. Head, chest, or back pain. Dark pee (urine) or blood in the pee. Widespread rash. Fast  heartbeat. Feeling dizzy or light-headed. You may receive your blood transfusion in an outpatient setting. If so, you will be told whom to contact to report any reactions. These symptoms may be an emergency. Do not wait to see if the symptoms will go away. Get medical help right away. Call your local emergency services (911 in the [place] Do not drive yourself to the hospital. Summary Bruising and soreness at the IV site are common. Check your insertion site every day for signs of infection. Rest as told by your doctor. Go back to your normal activities as told by your doctor. Get help right away if you have signs of a serious reaction. This information is not intended to replace advice given to you by your health care provider. Make sure you discuss any questions you have with your health care provider. Document Revised: 09/12/2020 Document Reviewed: 11/10/2018 Elsevier Patient Education  2022 Elsevier Inc.  

## 2021-07-28 ENCOUNTER — Other Ambulatory Visit: Payer: Self-pay

## 2021-07-28 ENCOUNTER — Inpatient Hospital Stay: Payer: Medicare Other

## 2021-07-28 ENCOUNTER — Encounter: Payer: Self-pay | Admitting: *Deleted

## 2021-07-28 ENCOUNTER — Inpatient Hospital Stay (HOSPITAL_BASED_OUTPATIENT_CLINIC_OR_DEPARTMENT_OTHER): Payer: Medicare Other | Admitting: Oncology

## 2021-07-28 ENCOUNTER — Encounter: Payer: Self-pay | Admitting: Oncology

## 2021-07-28 VITALS — BP 114/66 | HR 68 | Temp 97.8°F | Resp 20 | Ht 72.0 in | Wt 148.6 lb

## 2021-07-28 DIAGNOSIS — Z5111 Encounter for antineoplastic chemotherapy: Secondary | ICD-10-CM | POA: Diagnosis not present

## 2021-07-28 DIAGNOSIS — C9200 Acute myeloblastic leukemia, not having achieved remission: Secondary | ICD-10-CM

## 2021-07-28 LAB — CBC WITH DIFFERENTIAL (CANCER CENTER ONLY)
Abs Immature Granulocytes: 0 10*3/uL (ref 0.00–0.07)
Band Neutrophils: 5 %
Basophils Absolute: 0 10*3/uL (ref 0.0–0.1)
Basophils Relative: 1 %
Eosinophils Absolute: 0 10*3/uL (ref 0.0–0.5)
Eosinophils Relative: 2 %
HCT: 28.6 % — ABNORMAL LOW (ref 39.0–52.0)
Hemoglobin: 9.4 g/dL — ABNORMAL LOW (ref 13.0–17.0)
Lymphocytes Relative: 37 %
Lymphs Abs: 0.4 10*3/uL — ABNORMAL LOW (ref 0.7–4.0)
MCH: 29.3 pg (ref 26.0–34.0)
MCHC: 32.9 g/dL (ref 30.0–36.0)
MCV: 89.1 fL (ref 80.0–100.0)
Monocytes Absolute: 0 10*3/uL — ABNORMAL LOW (ref 0.1–1.0)
Monocytes Relative: 2 %
Neutro Abs: 0.7 10*3/uL — ABNORMAL LOW (ref 1.7–7.7)
Neutrophils Relative %: 53 %
Platelet Count: 6 10*3/uL — CL (ref 150–400)
RBC: 3.21 MIL/uL — ABNORMAL LOW (ref 4.22–5.81)
RDW: 17.9 % — ABNORMAL HIGH (ref 11.5–15.5)
WBC Count: 1.2 10*3/uL — ABNORMAL LOW (ref 4.0–10.5)
nRBC: 0 % (ref 0.0–0.2)

## 2021-07-28 LAB — BPAM RBC
Blood Product Expiration Date: 202303242359
Blood Product Expiration Date: 202303262359
ISSUE DATE / TIME: 202302240724
ISSUE DATE / TIME: 202302240724
Unit Type and Rh: 5100
Unit Type and Rh: 5100

## 2021-07-28 LAB — CMP (CANCER CENTER ONLY)
ALT: 53 U/L — ABNORMAL HIGH (ref 0–44)
AST: 37 U/L (ref 15–41)
Albumin: 3.6 g/dL (ref 3.5–5.0)
Alkaline Phosphatase: 82 U/L (ref 38–126)
Anion gap: 7 (ref 5–15)
BUN: 22 mg/dL (ref 8–23)
CO2: 28 mmol/L (ref 22–32)
Calcium: 9.3 mg/dL (ref 8.9–10.3)
Chloride: 104 mmol/L (ref 98–111)
Creatinine: 0.94 mg/dL (ref 0.61–1.24)
GFR, Estimated: >60 mL/min (ref >60)
Glucose, Bld: 113 mg/dL — ABNORMAL HIGH (ref 70–99)
Potassium: 4.3 mmol/L (ref 3.5–5.1)
Sodium: 139 mmol/L (ref 135–145)
Total Bilirubin: 1.2 mg/dL (ref 0.3–1.2)
Total Protein: 6.9 g/dL (ref 6.5–8.1)

## 2021-07-28 LAB — TYPE AND SCREEN
ABO/RH(D): AB POS
Antibody Screen: NEGATIVE
Unit division: 0
Unit division: 0

## 2021-07-28 MED ORDER — SODIUM CHLORIDE 0.9% IV SOLUTION
250.0000 mL | Freq: Once | INTRAVENOUS | Status: AC
  Administered 2021-07-28: 250 mL via INTRAVENOUS

## 2021-07-28 NOTE — Progress Notes (Signed)
CRITICAL VALUE STICKER  CRITICAL VALUE:Platelets 6,000   RECEIVER (on-site recipient of call): Aurie Harroun,RN  DATE & TIME NOTIFIED: 07/28/21 @ 0847  MESSENGER (representative from lab):Phyllis  MD NOTIFIED: Ned Card, NP  TIME OF NOTIFICATION:0850  RESPONSE: Transfuse platelets today.

## 2021-07-28 NOTE — Progress Notes (Signed)
[facility] OFFICE PROGRESS NOTE   Diagnosis: Myelodysplasia  INTERVAL HISTORY:   [name] returns as scheduled.  He completed another cycle of 5-azacytidine beginning 07/15/2021.  He received G-CSF on 07/21/2021.  No rash or diarrhea.  He reports resolution of the basal cell tumors at the left arm.  No bleeding.  No fever. He was transfused with platelets on 07/24/2021 and packed red cells on 07/25/2021.  Objective:  Vital signs in last 24 hours:  Blood pressure 114/66, pulse 68, temperature 97.8 F (36.6 C), temperature source Oral, resp. rate 20, height 6' (1.829 m), weight 148 lb 9.6 oz (67.4 kg), SpO2 100 %.    HEENT: No thrush or bleeding Resp: Lungs clear bilaterally Cardio: Irregular GI: No hepatosplenomegaly, left lower quadrant colostomy with brown stool Vascular: No leg edema  Skin: Multiple ecchymoses over the forearms, petechiae over the abdominal wall   Lab Results:  Lab Results  Component Value Date   WBC 2.0 (L) 07/24/2021   HGB 7.6 (L) 07/24/2021   HCT 23.3 (L) 07/24/2021   MCV 87.9 07/24/2021   PLT 6 (LL) 07/24/2021   NEUTROABS 1.4 (L) 07/24/2021    CMP  Lab Results  Component Value Date   NA 138 07/21/2021   K 4.5 07/21/2021   CL 104 07/21/2021   CO2 28 07/21/2021   GLUCOSE 90 07/21/2021   BUN 19 07/21/2021   CREATININE 1.00 07/21/2021   CALCIUM 9.4 07/21/2021   PROT 7.1 07/21/2021   ALBUMIN 3.8 07/21/2021   AST 17 07/21/2021   ALT 24 07/21/2021   ALKPHOS 69 07/21/2021   BILITOT 1.3 (H) 07/21/2021   GFRNONAA >60 07/21/2021   GFRAA 79 01/29/2020    Lab Results  Component Value Date   CEA1 3.9 10/22/2020    Medications: I have reviewed the patient's current medications.   Assessment/Plan: Sigmoid colon adenocarcinoma -Colonoscopy 10/21/2020- partially obstructing mass found in the sigmoid colon consistent with adenocarcinoma. -CEA on 10/22/2020 was 3.9 -CTs 10/22/2020-colonic mass with pelvic lymphadenopathy including  right common iliac and external iliac nodes, small pulmonary nodules -10/24/2020-sigmoid colectomy, end colostomy, tumor stuck to pelvic sidewall with rind of tissue remaining at completion of surgery -Pathology- moderately differentiated adenocarcinoma the sigmoid colon, tumor extends into pericolonic connective tissue, no lymphovascular perineural invasion, 0/14 lymph nodes, carcinoma involves an area with the specimen was disrupted at mesenteric margin; mismatch repair protein (IHC) normal; MSI stable -CTs 11/24/2020-interval resection of colonic mass, decreased size of previously noted right pelvic adenopathy, new noted at the right internal/external iliac bifurcation, unchanged pulmonary nodules -Guardant reveal 12/06/2020-ctDNA not detected -Cycle 1 adjuvant Xeloda anticipated 01/06/2021 -Cycle 2 adjuvant Xeloda 01/27/2021, Xeloda dose reduced secondary to thrombocytopenia (he took 1 pill a day for an unclear amount of time, less than 14 days) -Cycle 3 adjuvant Xeloda 02/17/2021 2.  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 3.  Thrombocytopenia-chronic 4.  Atrial flutter 5.  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  COPD 7.  Hypertension 8.  Diabetes mellitus 9.  Dementia 10.  Tobacco dependence 11. Left wrist basal cell carcinoma -Skin biopsy 08/16/2020 - Basal cell carcinoma, nodular and infiltrative patterns, peripheral and deep margins involved -Seen by radiation oncology 09/03/2020 -Established care with the wound center on 09/13/2019 -Placed on Sonidegib by dermatology, marked clinical improvement 12.  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  Admission 06/05/2021 with presyncope 14.  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  He tolerated treatment well.  He has persistent severe pancytopenia.  He was transfused with packed red cells and platelets last week.  He will receive a platelet transfusion today.  He will return for lab visit on 07/31/2021.  We will continue to check labs twice a week.  He will return for an office visit and cycle three 5-azacytidine on 08/11/2021.  He is scheduled to see dermatology later this week to decide on continuing Glidden.  He ran out of this medication last week.    Betsy Coder, MD  07/28/2021  8:29 AM

## 2021-07-28 NOTE — Patient Instructions (Signed)
Platelet Transfusion ?A platelet transfusion is a procedure in which a person receives donated platelets through an IV. Platelets are parts of blood that stick together and form a clot to help the body stop bleeding after an injury. If you have too few platelets, your blood may have trouble clotting. This may cause you to bleed and bruise very easily. ?You may need a platelet transfusion if you have a condition that causes a low number of platelets (thrombocytopenia). A platelet transfusion may be used to stop or prevent excessive bleeding. ?Tell a health care provider about: ?Any reactions you have had during previous transfusions. ?Any allergies you have. ?All medicines you are taking, including vitamins, herbs, eye drops, creams, and over-the-counter medicines. ?Any bleeding problems you have. ?Any surgeries you have had. ?Any medical conditions you have. ?Whether you are pregnant or may be pregnant. ?What are the risks? ?Generally, this is a safe procedure. However, problems may occur, including: ?Fever. ?Infection. ?Allergic reaction to the donated (donor) platelets. ?Your body's disease-fighting system (immune system) attacking the donor platelets (hemolytic reaction). This is rare. ?A rare reaction that causes lung damage (transfusion-related acute lung injury). ?What happens before the procedure? ?Medicines ?Ask your health care provider about: ?Changing or stopping your regular medicines. This is especially important if you are taking diabetes medicines or blood thinners. ?Taking medicines such as aspirin and ibuprofen. These medicines can thin your blood. Do not take these medicines unless your health care provider tells you to take them. ?Taking over-the-counter medicines, vitamins, herbs, and supplements. ?General instructions ?You will have a blood test to determine your blood type. Your blood type determines what kind of platelets you will be given. ?Follow instructions from your health care provider  about eating or drinking restrictions. ?If you have had an allergic reaction to a transfusion in the past, you may be given medicine to help prevent a reaction. ?Your temperature, blood pressure, pulse, and breathing will be monitored. ?What happens during the procedure? ? ?An IV will be inserted into one of your veins. ?For your safety, two health care providers will verify your identity along with the donor platelets about to be infused. ?A bag of donor platelets will be connected to your IV. The platelets will flow into your bloodstream. This usually takes 30-60 minutes. ?Your temperature, blood pressure, pulse, and breathing will be monitored during the transfusion. This helps detect early signs of any reaction. ?You will also be monitored for other symptoms that may indicate a reaction, including chills, hives, or itching. ?If you have signs of a reaction at any time, your transfusion will be stopped, and you may be given medicine to help manage the reaction. ?When your transfusion is complete, your IV will be removed. ?Pressure may be applied to the IV site for a few minutes to stop any bleeding. ?The IV site will be covered with a bandage (dressing). ?The procedure may vary among health care providers and hospitals. ?What can I expect after the procedure? ?Your blood pressure, temperature, pulse, and breathing will be monitored until you leave the hospital or clinic. ?You may have some bruising and soreness at your IV site. ?Follow these instructions at home: ?Medicines ?Take over-the-counter and prescription medicines only as told by your health care provider. ?Talk with your health care provider before you take any medicines that contain aspirin or NSAIDs, such as ibuprofen. These medicines increase your risk for dangerous bleeding. ?IV site care ?Check your IV site every day for signs of infection. Check for: ?  Redness, swelling, or pain. ?Fluid or blood. If fluid or blood drains from your IV site, use your  hands to press down firmly on a bandage covering the area for a minute or two. Doing this should stop the bleeding. ?Warmth. ?Pus or a bad smell. ?General instructions ?Change or remove your dressing as told by your health care provider. ?Return to your normal activities as told by your health care provider. Ask your health care provider what activities are safe for you. ?Do not take baths, swim, or use a hot tub until your health care provider approves. Ask your health care provider if you may take showers. ?Keep all follow-up visits. This is important. ?Contact a health care provider if: ?You have a headache that does not go away with medicine. ?You have hives, rash, or itchy skin. ?You have nausea or vomiting. ?You feel unusually tired or weak. ?You have signs of infection at your IV site. ?Get help right away if: ?You have a fever or chills. ?You urinate less often than usual. ?Your urine is darker colored than normal. ?You have any of the following: ?Trouble breathing. ?Pain in your back, abdomen, or chest. ?Cool, clammy skin. ?A fast heartbeat. ?Summary ?Platelets are tiny pieces of blood cells that clump together to form a blood clot when you have an injury. If you have too few platelets, your blood may have trouble clotting. ?A platelet transfusion is a procedure in which you receive donated platelets through an IV. ?A platelet transfusion may be used to stop or prevent excessive bleeding. ?After the procedure, check your IV site every day for signs of infection. ?This information is not intended to replace advice given to you by your health care provider. Make sure you discuss any questions you have with your health care provider. ?Document Revised: 11/21/2020 Document Reviewed: 11/21/2020 ?Elsevier Patient Education ? 2022 Elsevier Inc. ? ?

## 2021-07-29 LAB — BPAM PLATELET PHERESIS
Blood Product Expiration Date: 202303012359
ISSUE DATE / TIME: 202302271002
Unit Type and Rh: 600

## 2021-07-29 LAB — PREPARE PLATELET PHERESIS: Unit division: 0

## 2021-07-31 ENCOUNTER — Inpatient Hospital Stay: Payer: Medicare Other

## 2021-07-31 ENCOUNTER — Other Ambulatory Visit: Payer: Self-pay | Admitting: *Deleted

## 2021-07-31 ENCOUNTER — Other Ambulatory Visit: Payer: Self-pay

## 2021-07-31 ENCOUNTER — Inpatient Hospital Stay: Payer: Medicare Other | Attending: Nurse Practitioner

## 2021-07-31 DIAGNOSIS — Z79899 Other long term (current) drug therapy: Secondary | ICD-10-CM | POA: Diagnosis not present

## 2021-07-31 DIAGNOSIS — F17200 Nicotine dependence, unspecified, uncomplicated: Secondary | ICD-10-CM | POA: Insufficient documentation

## 2021-07-31 DIAGNOSIS — R7989 Other specified abnormal findings of blood chemistry: Secondary | ICD-10-CM | POA: Insufficient documentation

## 2021-07-31 DIAGNOSIS — D509 Iron deficiency anemia, unspecified: Secondary | ICD-10-CM | POA: Insufficient documentation

## 2021-07-31 DIAGNOSIS — I10 Essential (primary) hypertension: Secondary | ICD-10-CM | POA: Insufficient documentation

## 2021-07-31 DIAGNOSIS — C9200 Acute myeloblastic leukemia, not having achieved remission: Secondary | ICD-10-CM

## 2021-07-31 DIAGNOSIS — F0390 Unspecified dementia without behavioral disturbance: Secondary | ICD-10-CM | POA: Insufficient documentation

## 2021-07-31 DIAGNOSIS — J449 Chronic obstructive pulmonary disease, unspecified: Secondary | ICD-10-CM | POA: Insufficient documentation

## 2021-07-31 DIAGNOSIS — D61818 Other pancytopenia: Secondary | ICD-10-CM | POA: Insufficient documentation

## 2021-07-31 DIAGNOSIS — E119 Type 2 diabetes mellitus without complications: Secondary | ICD-10-CM | POA: Insufficient documentation

## 2021-07-31 DIAGNOSIS — D696 Thrombocytopenia, unspecified: Secondary | ICD-10-CM | POA: Insufficient documentation

## 2021-07-31 LAB — CBC WITH DIFFERENTIAL (CANCER CENTER ONLY)
Abs Immature Granulocytes: 0.01 10*3/uL (ref 0.00–0.07)
Basophils Absolute: 0 10*3/uL (ref 0.0–0.1)
Basophils Relative: 0 %
Eosinophils Absolute: 0 10*3/uL (ref 0.0–0.5)
Eosinophils Relative: 1 %
HCT: 27.7 % — ABNORMAL LOW (ref 39.0–52.0)
Hemoglobin: 9.1 g/dL — ABNORMAL LOW (ref 13.0–17.0)
Immature Granulocytes: 1 %
Lymphocytes Relative: 43 %
Lymphs Abs: 0.5 10*3/uL — ABNORMAL LOW (ref 0.7–4.0)
MCH: 29.4 pg (ref 26.0–34.0)
MCHC: 32.9 g/dL (ref 30.0–36.0)
MCV: 89.4 fL (ref 80.0–100.0)
Monocytes Absolute: 0 10*3/uL — ABNORMAL LOW (ref 0.1–1.0)
Monocytes Relative: 3 %
Neutro Abs: 0.6 10*3/uL — ABNORMAL LOW (ref 1.7–7.7)
Neutrophils Relative %: 52 %
Platelet Count: 10 10*3/uL — ABNORMAL LOW (ref 150–400)
RBC: 3.1 MIL/uL — ABNORMAL LOW (ref 4.22–5.81)
RDW: 17.5 % — ABNORMAL HIGH (ref 11.5–15.5)
WBC Count: 1.2 10*3/uL — ABNORMAL LOW (ref 4.0–10.5)
nRBC: 0 % (ref 0.0–0.2)

## 2021-07-31 LAB — SAMPLE TO BLOOD BANK

## 2021-07-31 NOTE — Progress Notes (Signed)
Platelet orders placed for 08/01/21 and ticket sent to blood bank. Dash called. ?

## 2021-08-01 ENCOUNTER — Inpatient Hospital Stay: Payer: Medicare Other

## 2021-08-01 DIAGNOSIS — D696 Thrombocytopenia, unspecified: Secondary | ICD-10-CM

## 2021-08-01 DIAGNOSIS — C9200 Acute myeloblastic leukemia, not having achieved remission: Secondary | ICD-10-CM

## 2021-08-01 MED ORDER — SODIUM CHLORIDE 0.9% IV SOLUTION
250.0000 mL | Freq: Once | INTRAVENOUS | Status: AC
  Administered 2021-08-01: 250 mL via INTRAVENOUS

## 2021-08-01 NOTE — Progress Notes (Signed)
Spoke to the patient regarding the presence of bedbugs in the room on a previous visit.  Pt was aware of his situation at home and stated that they (his daughter in law) and himself were going to "do the beds" this weekend. Pt stated that he didn't want the "wrong people" to find out and please not to tell anyone.  Also spoke to the patient about his hygiene. Bathing regularly and trying to wear clean clothes.   ?Explained to patient we would need him to come straight back to the infusion room chair and stay seated throughout his treatment. Pt expressed understanding. ?

## 2021-08-01 NOTE — Patient Instructions (Signed)
Essential Thrombocythemia ?Essential thrombocythemia is a condition in which a person has too many platelets (thrombocytes) in the blood. Platelets are parts of blood that stick together and form a clot (thrombus) to help the body stop bleeding after an injury. This condition may also be called primary or essential thrombocytosis. ?Essential thrombocythemia happens when abnormal cells in the bone marrow (megakaryocytes) make too many platelets. ?What are the causes? ?The cause of this condition is not known. ?What are the signs or symptoms? ?This condition may not cause any symptoms. If you have symptoms, they may include: ?Blood clots. ?Weakness. ?Headache. ?Itching. ?Sweating or fever. ?Dizziness or confusion. ?Tingling or burning in your hands or feet. ?Symptoms may also include bleeding and an enlarged spleen. ?How is this diagnosed? ?This condition may be diagnosed based on: ?A physical exam. ?Your symptoms. ?Your medical history. ?Blood tests. ?A procedure to collect a sample of your bone marrow (bone marrow aspiration) for testing. ?How is this treated? ?If you do not have symptoms, you may not need treatment. Your health care provider may monitor your condition with regular blood tests. ?If you have symptoms, or if your platelet count is very high, you may be treated with: ?Aspirin or other medicines to thin your blood and help prevent blood clots. ?Medicines to reduce the number of platelets in your blood. ?A procedure to remove some platelets from your blood (plateletpheresis). During this procedure: ?Your health care provider will place an IV into one of your veins. ?The IV will be used to draw your blood into a machine that separates out the extra platelets. ?The blood with reduced platelets will be returned to your body. ?Follow these instructions at home: ?Medicines ?Take over-the-counter and prescription medicines only as told by your health care provider. ?If you are taking blood thinners: ?Talk with  your health care provider before you take any medicines that contain aspirin or NSAIDs, such as ibuprofen. These medicines increase your risk for dangerous bleeding. ?Take your medicine exactly as told, at the same time every day. ?Avoid activities that could cause injury or bruising, and follow instructions about how to prevent falls. ?Wear a medical alert bracelet or carry a card that lists what medicines you take. ?Tell all health care providers, including dental health care providers, about any medicines you are taking to prevent blood clots. ?General instructions ?Do not use any products that contain nicotine or tobacco. These products include cigarettes, chewing tobacco, and vaping devices, such as e-cigarettes. If you need help quitting, ask your health care provider. ?Ask your health care provider about managing or preventing high cholesterol, high blood pressure, and diabetes. These conditions can make essential thrombocythemia worse. ?Keep all follow-up visits. This is important. ?Contact a health care provider if: ?You have severe pain, and medicines do not help. ?You have problems taking your medicines to prevent blood clots. ?You have new pain, warmth, swelling, or redness in an arm or leg. This could mean you have a blood clot. ?You faint. ?You have unusual bruises. ?You have bloody or tarry stools. ?You have pink or bloody urine. ?Your menstrual periods are heavier than normal, if applicable. ?You have nosebleeds, bleeding gums, or other bleeding. ?Get help right away if: ? ?You have chest pain. ?You have trouble breathing. ?You have any symptoms of a stroke. "BE FAST" is an easy way to remember the main warning signs of a stroke: ?B - Balance. Signs are dizziness, sudden trouble walking, or loss of balance. ?E - Eyes. Signs are trouble  seeing or a sudden change in vision. ?F - Face. Signs are sudden weakness or numbness of the face, or the face or eyelid drooping on one side. ?A - Arm. Signs are  weakness or numbness in an arm. This happens suddenly and usually on one side of the body. ?S - Speech. Signs are sudden trouble speaking, slurred speech, or trouble understanding what people say. ?T - Time. Time to call emergency services. Write down what time symptoms started. ?You have other signs of a stroke, such as: ?A sudden, severe headache with no known cause. ?Nausea or vomiting. ?Seizure. ?These symptoms may represent a serious problem that is an emergency. Do not wait to see if the symptoms will go away. Get medical help right away. Call your local emergency services (911 in the [place] Do not drive yourself to the hospital. ?Summary ?Essential thrombocythemia happens when abnormal cells in the bone marrow make too many platelets. ?If you have symptoms, or if your platelet count is very high, you may need treatment. ?Treatment can vary and may include medicines to thin the blood and prevent blood clots. ?Ask your health care provider about how to manage or prevent high cholesterol, high blood pressure, and diabetes. All of these can make essential thrombocythemia worse. ?Get help right away if you have any symptoms of stroke. ?This information is not intended to replace advice given to you by your health care provider. Make sure you discuss any questions you have with your health care provider. ?Document Revised: 11/18/2020 Document Reviewed: 11/18/2020 ?Elsevier Patient Education ? [employer]. ? ?

## 2021-08-04 ENCOUNTER — Other Ambulatory Visit: Payer: Self-pay

## 2021-08-04 ENCOUNTER — Inpatient Hospital Stay: Payer: Medicare Other

## 2021-08-04 DIAGNOSIS — C9200 Acute myeloblastic leukemia, not having achieved remission: Secondary | ICD-10-CM | POA: Diagnosis not present

## 2021-08-04 LAB — CBC WITH DIFFERENTIAL (CANCER CENTER ONLY)
Abs Immature Granulocytes: 0 10*3/uL (ref 0.00–0.07)
Basophils Absolute: 0 10*3/uL (ref 0.0–0.1)
Basophils Relative: 0 %
Eosinophils Absolute: 0 10*3/uL (ref 0.0–0.5)
Eosinophils Relative: 1 %
HCT: 27.6 % — ABNORMAL LOW (ref 39.0–52.0)
Hemoglobin: 9 g/dL — ABNORMAL LOW (ref 13.0–17.0)
Immature Granulocytes: 0 %
Lymphocytes Relative: 41 %
Lymphs Abs: 0.4 10*3/uL — ABNORMAL LOW (ref 0.7–4.0)
MCH: 29.6 pg (ref 26.0–34.0)
MCHC: 32.6 g/dL (ref 30.0–36.0)
MCV: 90.8 fL (ref 80.0–100.0)
Monocytes Absolute: 0 10*3/uL — ABNORMAL LOW (ref 0.1–1.0)
Monocytes Relative: 3 %
Neutro Abs: 0.6 10*3/uL — ABNORMAL LOW (ref 1.7–7.7)
Neutrophils Relative %: 55 %
Platelet Count: 16 10*3/uL — ABNORMAL LOW (ref 150–400)
RBC: 3.04 MIL/uL — ABNORMAL LOW (ref 4.22–5.81)
RDW: 17.9 % — ABNORMAL HIGH (ref 11.5–15.5)
WBC Count: 1 10*3/uL — ABNORMAL LOW (ref 4.0–10.5)
nRBC: 0 % (ref 0.0–0.2)

## 2021-08-04 LAB — PREPARE PLATELET PHERESIS: Unit division: 0

## 2021-08-04 LAB — BPAM PLATELET PHERESIS
Blood Product Expiration Date: 202303032359
ISSUE DATE / TIME: 202303030728
Unit Type and Rh: 6200

## 2021-08-04 LAB — SAMPLE TO BLOOD BANK

## 2021-08-04 NOTE — Progress Notes (Signed)
Platelets 16 today.  MD made aware. No platelet transfusion today.  Patient made aware and verbalized understanding.  Patient reminded of appointments on 08/07/21. ?

## 2021-08-07 ENCOUNTER — Encounter: Payer: Self-pay | Admitting: Nurse Practitioner

## 2021-08-07 ENCOUNTER — Other Ambulatory Visit: Payer: Self-pay

## 2021-08-07 ENCOUNTER — Inpatient Hospital Stay (HOSPITAL_BASED_OUTPATIENT_CLINIC_OR_DEPARTMENT_OTHER): Payer: Medicare Other | Admitting: Nurse Practitioner

## 2021-08-07 ENCOUNTER — Inpatient Hospital Stay: Payer: Medicare Other

## 2021-08-07 VITALS — BP 118/69 | HR 70 | Temp 98.1°F | Resp 20 | Ht 72.0 in | Wt 149.6 lb

## 2021-08-07 DIAGNOSIS — C9200 Acute myeloblastic leukemia, not having achieved remission: Secondary | ICD-10-CM

## 2021-08-07 LAB — CBC WITH DIFFERENTIAL (CANCER CENTER ONLY)
Abs Immature Granulocytes: 0 10*3/uL (ref 0.00–0.07)
Basophils Absolute: 0 10*3/uL (ref 0.0–0.1)
Basophils Relative: 1 %
Eosinophils Absolute: 0 10*3/uL (ref 0.0–0.5)
Eosinophils Relative: 1 %
HCT: 25.8 % — ABNORMAL LOW (ref 39.0–52.0)
Hemoglobin: 8.5 g/dL — ABNORMAL LOW (ref 13.0–17.0)
Lymphocytes Relative: 61 %
Lymphs Abs: 0.5 10*3/uL — ABNORMAL LOW (ref 0.7–4.0)
MCH: 29.3 pg (ref 26.0–34.0)
MCHC: 32.9 g/dL (ref 30.0–36.0)
MCV: 89 fL (ref 80.0–100.0)
Monocytes Absolute: 0 10*3/uL — ABNORMAL LOW (ref 0.1–1.0)
Monocytes Relative: 3 %
Neutro Abs: 0.3 10*3/uL — CL (ref 1.7–7.7)
Neutrophils Relative %: 34 %
Platelet Count: 11 10*3/uL — ABNORMAL LOW (ref 150–400)
RBC: 2.9 MIL/uL — ABNORMAL LOW (ref 4.22–5.81)
RDW: 17.2 % — ABNORMAL HIGH (ref 11.5–15.5)
Smear Review: NORMAL
WBC Count: 0.8 10*3/uL — CL (ref 4.0–10.5)
nRBC: 0 % (ref 0.0–0.2)

## 2021-08-07 LAB — SAMPLE TO BLOOD BANK

## 2021-08-07 MED ORDER — SODIUM CHLORIDE 0.9% IV SOLUTION
250.0000 mL | Freq: Once | INTRAVENOUS | Status: AC
  Administered 2021-08-07: 12:00:00 250 mL via INTRAVENOUS

## 2021-08-07 NOTE — Addendum Note (Signed)
Addended by: Velna Hatchet on: 08/07/2021 09:51 AM ? ? Modules accepted: Orders ? ?

## 2021-08-07 NOTE — Progress Notes (Signed)
?[place] ?OFFICE PROGRESS NOTE ? ? ?Diagnosis: Myelodysplasia ? ?INTERVAL HISTORY:  ? ?[name] returns as scheduled.  He completed cycle two 5-azacytidine beginning 07/14/2021.  He denies nausea/vomiting.  No mouth sores.  No diarrhea.  He denies fever.  He notes some bleeding from the stoma.  No other bleeding.  He has a good appetite. ? ?Objective: ? ?Vital signs in last 24 hours: ? ?Blood pressure 118/69, pulse 70, temperature 98.1 ?F (36.7 ?C), temperature source Oral, resp. rate 20, height 6' (1.829 m), weight 149 lb 9.6 oz (67.9 kg), SpO2 100 %. ?  ? ?HEENT: No thrush or ulcers.  No bleeding noted within the oral cavity. ?Resp: Lungs clear bilaterally. ?Cardio: Regular rate and rhythm. ?GI: No hepatomegaly.  Upper aspect of the stoma appears irritated.  Small amount of watery blood-tinged output in the ostomy collection bag. ?Vascular: No leg edema. ?Skin: A few tiny ecchymoses at the lower legs. ? ? ?Lab Results: ? ?Lab Results  ?Component Value Date  ? WBC 1.0 (L) 08/04/2021  ? HGB 9.0 (L) 08/04/2021  ? HCT 27.6 (L) 08/04/2021  ? MCV 90.8 08/04/2021  ? PLT 16 (L) 08/04/2021  ? NEUTROABS 0.6 (L) 08/04/2021  ? ? ?Imaging: ? ?No results found. ? ?Medications: I have reviewed the patient's current medications. ? ?Assessment/Plan: ?Sigmoid colon adenocarcinoma ?-Colonoscopy 10/21/2020- partially obstructing mass found in the sigmoid colon consistent with adenocarcinoma. ?-CEA on 10/22/2020 was 3.9 ?-CTs 10/22/2020-colonic mass with pelvic lymphadenopathy including right common iliac and external iliac nodes, small pulmonary nodules ?-10/24/2020-sigmoid colectomy, end colostomy, tumor stuck to pelvic sidewall with rind of tissue remaining at completion of surgery ?-Pathology- moderately differentiated adenocarcinoma the sigmoid colon, tumor extends into pericolonic connective tissue, no lymphovascular perineural invasion, 0/14 lymph nodes, carcinoma involves an area with the specimen was disrupted  at mesenteric margin; mismatch repair protein (IHC) normal; MSI stable ?-CTs 11/24/2020-interval resection of colonic mass, decreased size of previously noted right pelvic adenopathy, new noted at the right internal/external iliac bifurcation, unchanged pulmonary nodules ?-Guardant reveal 12/06/2020-ctDNA not detected ?-Cycle 1 adjuvant Xeloda anticipated 01/06/2021 ?-Cycle 2 adjuvant Xeloda 01/27/2021, Xeloda dose reduced secondary to thrombocytopenia (he took 1 pill a day for an unclear amount of time, less than 14 days) ?-Cycle 3 adjuvant Xeloda 02/17/2021 ?2.  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 ?3.  Thrombocytopenia-chronic ?4.  Atrial flutter ?5.  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  COPD ?7.  Hypertension ?8.  Diabetes mellitus ?9.  Dementia ?10.  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  ?13.  Admission 06/05/2021 with presyncope ?14.  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  ? ?Disposition: [name] appears stable.  He has completed 2 cycles of 5-azacytidine.   Review of the CBC from today shows persistent thrombocytopenia  and leukopenia.  Differential is pending.  He appears to have a small amount of bleeding from the stoma.  He will receive a platelet transfusion today.  He will return for a follow-up CBC on 08/11/2021 and 08/14/2021.  He is due to begin the next cycle of 5-azacytidine on 08/11/2021.  We are going to delay the next cycle of 5-azacytidine to 08/18/2021.  We will see him in follow-up prior to beginning cycle 3 on 08/18/2021.  He understands to contact the office in the interim with bleeding and/or fever. ? ?Plan reviewed with Dr. Benay Spice. ? ? ? ?Ned Card ANP/GNP-BC  ? ?08/07/2021  ?9:17 AM ? ? ? ? ? ? ? ?

## 2021-08-07 NOTE — Patient Instructions (Signed)
Platelet Transfusion ?A platelet transfusion is a procedure in which a person receives donated platelets through an IV. Platelets are parts of blood that stick together and form a clot to help the body stop bleeding after an injury. If you have too few platelets, your blood may have trouble clotting. This may cause you to bleed and bruise very easily. ?You may need a platelet transfusion if you have a condition that causes a low number of platelets (thrombocytopenia). A platelet transfusion may be used to stop or prevent excessive bleeding. ?Tell a health care provider about: ?Any reactions you have had during previous transfusions. ?Any allergies you have. ?All medicines you are taking, including vitamins, herbs, eye drops, creams, and over-the-counter medicines. ?Any bleeding problems you have. ?Any surgeries you have had. ?Any medical conditions you have. ?Whether you are pregnant or may be pregnant. ?What are the risks? ?Generally, this is a safe procedure. However, problems may occur, including: ?Fever. ?Infection. ?Allergic reaction to the donated (donor) platelets. ?Your body's disease-fighting system (immune system) attacking the donor platelets (hemolytic reaction). This is rare. ?A rare reaction that causes lung damage (transfusion-related acute lung injury). ?What happens before the procedure? ?Medicines ?Ask your health care provider about: ?Changing or stopping your regular medicines. This is especially important if you are taking diabetes medicines or blood thinners. ?Taking medicines such as aspirin and ibuprofen. These medicines can thin your blood. Do not take these medicines unless your health care provider tells you to take them. ?Taking over-the-counter medicines, vitamins, herbs, and supplements. ?General instructions ?You will have a blood test to determine your blood type. Your blood type determines what kind of platelets you will be given. ?Follow instructions from your health care provider  about eating or drinking restrictions. ?If you have had an allergic reaction to a transfusion in the past, you may be given medicine to help prevent a reaction. ?Your temperature, blood pressure, pulse, and breathing will be monitored. ?What happens during the procedure? ? ?An IV will be inserted into one of your veins. ?For your safety, two health care providers will verify your identity along with the donor platelets about to be infused. ?A bag of donor platelets will be connected to your IV. The platelets will flow into your bloodstream. This usually takes 30-60 minutes. ?Your temperature, blood pressure, pulse, and breathing will be monitored during the transfusion. This helps detect early signs of any reaction. ?You will also be monitored for other symptoms that may indicate a reaction, including chills, hives, or itching. ?If you have signs of a reaction at any time, your transfusion will be stopped, and you may be given medicine to help manage the reaction. ?When your transfusion is complete, your IV will be removed. ?Pressure may be applied to the IV site for a few minutes to stop any bleeding. ?The IV site will be covered with a bandage (dressing). ?The procedure may vary among health care providers and hospitals. ?What can I expect after the procedure? ?Your blood pressure, temperature, pulse, and breathing will be monitored until you leave the hospital or clinic. ?You may have some bruising and soreness at your IV site. ?Follow these instructions at home: ?Medicines ?Take over-the-counter and prescription medicines only as told by your health care provider. ?Talk with your health care provider before you take any medicines that contain aspirin or NSAIDs, such as ibuprofen. These medicines increase your risk for dangerous bleeding. ?IV site care ?Check your IV site every day for signs of infection. Check for: ?  Redness, swelling, or pain. ?Fluid or blood. If fluid or blood drains from your IV site, use your  hands to press down firmly on a bandage covering the area for a minute or two. Doing this should stop the bleeding. ?Warmth. ?Pus or a bad smell. ?General instructions ?Change or remove your dressing as told by your health care provider. ?Return to your normal activities as told by your health care provider. Ask your health care provider what activities are safe for you. ?Do not take baths, swim, or use a hot tub until your health care provider approves. Ask your health care provider if you may take showers. ?Keep all follow-up visits. This is important. ?Contact a health care provider if: ?You have a headache that does not go away with medicine. ?You have hives, rash, or itchy skin. ?You have nausea or vomiting. ?You feel unusually tired or weak. ?You have signs of infection at your IV site. ?Get help right away if: ?You have a fever or chills. ?You urinate less often than usual. ?Your urine is darker colored than normal. ?You have any of the following: ?Trouble breathing. ?Pain in your back, abdomen, or chest. ?Cool, clammy skin. ?A fast heartbeat. ?Summary ?Platelets are tiny pieces of blood cells that clump together to form a blood clot when you have an injury. If you have too few platelets, your blood may have trouble clotting. ?A platelet transfusion is a procedure in which you receive donated platelets through an IV. ?A platelet transfusion may be used to stop or prevent excessive bleeding. ?After the procedure, check your IV site every day for signs of infection. ?This information is not intended to replace advice given to you by your health care provider. Make sure you discuss any questions you have with your health care provider. ?Document Revised: 11/21/2020 Document Reviewed: 11/21/2020 ?Elsevier Patient Education ? 2022 Elsevier Inc. ? ?

## 2021-08-08 LAB — PREPARE PLATELET PHERESIS: Unit division: 0

## 2021-08-08 LAB — BPAM PLATELET PHERESIS
Blood Product Expiration Date: 202303102359
ISSUE DATE / TIME: 202303091019
Unit Type and Rh: 5100

## 2021-08-11 ENCOUNTER — Inpatient Hospital Stay: Payer: Medicare Other

## 2021-08-11 ENCOUNTER — Other Ambulatory Visit: Payer: Self-pay

## 2021-08-11 ENCOUNTER — Encounter: Payer: Self-pay | Admitting: *Deleted

## 2021-08-11 DIAGNOSIS — C9200 Acute myeloblastic leukemia, not having achieved remission: Secondary | ICD-10-CM

## 2021-08-11 LAB — CBC WITH DIFFERENTIAL (CANCER CENTER ONLY)
Abs Immature Granulocytes: 0 10*3/uL (ref 0.00–0.07)
Basophils Absolute: 0 10*3/uL (ref 0.0–0.1)
Basophils Relative: 0 %
Eosinophils Absolute: 0 10*3/uL (ref 0.0–0.5)
Eosinophils Relative: 0 %
HCT: 26.2 % — ABNORMAL LOW (ref 39.0–52.0)
Hemoglobin: 8.8 g/dL — ABNORMAL LOW (ref 13.0–17.0)
Immature Granulocytes: 0 %
Lymphocytes Relative: 56 %
Lymphs Abs: 0.5 10*3/uL — ABNORMAL LOW (ref 0.7–4.0)
MCH: 29.6 pg (ref 26.0–34.0)
MCHC: 33.6 g/dL (ref 30.0–36.0)
MCV: 88.2 fL (ref 80.0–100.0)
Monocytes Absolute: 0 10*3/uL — ABNORMAL LOW (ref 0.1–1.0)
Monocytes Relative: 3 %
Neutro Abs: 0.4 10*3/uL — CL (ref 1.7–7.7)
Neutrophils Relative %: 41 %
Platelet Count: 12 10*3/uL — ABNORMAL LOW (ref 150–400)
RBC: 2.97 MIL/uL — ABNORMAL LOW (ref 4.22–5.81)
RDW: 17.3 % — ABNORMAL HIGH (ref 11.5–15.5)
Smear Review: NORMAL
WBC Count: 0.9 10*3/uL — CL (ref 4.0–10.5)
nRBC: 0 % (ref 0.0–0.2)

## 2021-08-11 LAB — CMP (CANCER CENTER ONLY)
ALT: 22 U/L (ref 0–44)
AST: 16 U/L (ref 15–41)
Albumin: 4 g/dL (ref 3.5–5.0)
Alkaline Phosphatase: 82 U/L (ref 38–126)
Anion gap: 7 (ref 5–15)
BUN: 25 mg/dL — ABNORMAL HIGH (ref 8–23)
CO2: 28 mmol/L (ref 22–32)
Calcium: 9.7 mg/dL (ref 8.9–10.3)
Chloride: 104 mmol/L (ref 98–111)
Creatinine: 0.95 mg/dL (ref 0.61–1.24)
GFR, Estimated: >60 mL/min (ref >60)
Glucose, Bld: 125 mg/dL — ABNORMAL HIGH (ref 70–99)
Potassium: 3.9 mmol/L (ref 3.5–5.1)
Sodium: 139 mmol/L (ref 135–145)
Total Bilirubin: 1.2 mg/dL (ref 0.3–1.2)
Total Protein: 7.6 g/dL (ref 6.5–8.1)

## 2021-08-11 LAB — SAMPLE TO BLOOD BANK

## 2021-08-11 LAB — LACTATE DEHYDROGENASE: LDH: 127 U/L (ref 98–192)

## 2021-08-11 NOTE — Progress Notes (Signed)
CRITICAL VALUE STICKER ? ?CRITICAL VALUE: WBC 0.9 and ANC 0.4 Patient denies any fever or s/s of infection ? ?RECEIVER (on-site recipient of call):Feather Berrie,RN ? ?DATE & TIME NOTIFIED: 08/10/21 @ 0920 ? ?MESSENGER (representative from lab):Phyllis ? ?MD NOTIFIED:Dr. Benay Spice ? ?TIME OF NOTIFICATION:0927 ? ?RESPONSE:  Slightly improved. F/U on 3/16 as scheduled. ?MD aware of platelet count 12,000--no transfusion at this time. Patient denies any bleeding. ?

## 2021-08-12 ENCOUNTER — Inpatient Hospital Stay: Payer: Medicare Other

## 2021-08-12 ENCOUNTER — Other Ambulatory Visit: Payer: Medicare Other

## 2021-08-12 ENCOUNTER — Ambulatory Visit: Payer: Medicare Other | Admitting: Nurse Practitioner

## 2021-08-13 ENCOUNTER — Inpatient Hospital Stay: Payer: Medicare Other

## 2021-08-14 ENCOUNTER — Inpatient Hospital Stay: Payer: Medicare Other

## 2021-08-14 ENCOUNTER — Other Ambulatory Visit: Payer: Self-pay

## 2021-08-14 ENCOUNTER — Inpatient Hospital Stay: Payer: Medicare Other | Admitting: *Deleted

## 2021-08-14 DIAGNOSIS — C9200 Acute myeloblastic leukemia, not having achieved remission: Secondary | ICD-10-CM | POA: Diagnosis not present

## 2021-08-14 LAB — SAMPLE TO BLOOD BANK

## 2021-08-14 LAB — CMP (CANCER CENTER ONLY)
ALT: 18 U/L (ref 0–44)
AST: 14 U/L — ABNORMAL LOW (ref 15–41)
Albumin: 3.9 g/dL (ref 3.5–5.0)
Alkaline Phosphatase: 73 U/L (ref 38–126)
Anion gap: 7 (ref 5–15)
BUN: 41 mg/dL — ABNORMAL HIGH (ref 8–23)
CO2: 26 mmol/L (ref 22–32)
Calcium: 9.5 mg/dL (ref 8.9–10.3)
Chloride: 108 mmol/L (ref 98–111)
Creatinine: 1.15 mg/dL (ref 0.61–1.24)
GFR, Estimated: >60 mL/min (ref >60)
Glucose, Bld: 108 mg/dL — ABNORMAL HIGH (ref 70–99)
Potassium: 4.3 mmol/L (ref 3.5–5.1)
Sodium: 141 mmol/L (ref 135–145)
Total Bilirubin: 0.8 mg/dL (ref 0.3–1.2)
Total Protein: 7.5 g/dL (ref 6.5–8.1)

## 2021-08-14 LAB — CBC WITH DIFFERENTIAL (CANCER CENTER ONLY)
Abs Immature Granulocytes: 0.01 10*3/uL (ref 0.00–0.07)
Basophils Absolute: 0 10*3/uL (ref 0.0–0.1)
Basophils Relative: 0 %
Eosinophils Absolute: 0 10*3/uL (ref 0.0–0.5)
Eosinophils Relative: 3 %
HCT: 24.4 % — ABNORMAL LOW (ref 39.0–52.0)
Hemoglobin: 7.9 g/dL — ABNORMAL LOW (ref 13.0–17.0)
Immature Granulocytes: 1 %
Lymphocytes Relative: 53 %
Lymphs Abs: 0.5 10*3/uL — ABNORMAL LOW (ref 0.7–4.0)
MCH: 29.4 pg (ref 26.0–34.0)
MCHC: 32.4 g/dL (ref 30.0–36.0)
MCV: 90.7 fL (ref 80.0–100.0)
Monocytes Absolute: 0 10*3/uL — ABNORMAL LOW (ref 0.1–1.0)
Monocytes Relative: 5 %
Neutro Abs: 0.3 10*3/uL — CL (ref 1.7–7.7)
Neutrophils Relative %: 38 %
Platelet Count: 10 10*3/uL — ABNORMAL LOW (ref 150–400)
RBC: 2.69 MIL/uL — ABNORMAL LOW (ref 4.22–5.81)
RDW: 17.8 % — ABNORMAL HIGH (ref 11.5–15.5)
WBC Count: 0.9 10*3/uL — CL (ref 4.0–10.5)
nRBC: 0 % (ref 0.0–0.2)

## 2021-08-14 MED ORDER — SODIUM CHLORIDE 0.9% IV SOLUTION
250.0000 mL | Freq: Once | INTRAVENOUS | Status: AC
  Administered 2021-08-14: 250 mL via INTRAVENOUS

## 2021-08-14 NOTE — Patient Instructions (Signed)
Platelet Transfusion ?A platelet transfusion is a procedure in which a person receives donated platelets through an IV. Platelets are parts of blood that stick together and form a clot to help the body stop bleeding after an injury. If you have too few platelets, your blood may have trouble clotting. This may cause you to bleed and bruise very easily. ?You may need a platelet transfusion if you have a condition that causes a low number of platelets (thrombocytopenia). A platelet transfusion may be used to stop or prevent excessive bleeding. ?Tell a health care provider about: ?Any reactions you have had during previous transfusions. ?Any allergies you have. ?All medicines you are taking, including vitamins, herbs, eye drops, creams, and over-the-counter medicines. ?Any bleeding problems you have. ?Any surgeries you have had. ?Any medical conditions you have. ?Whether you are pregnant or may be pregnant. ?What are the risks? ?Generally, this is a safe procedure. However, problems may occur, including: ?Fever. ?Infection. ?Allergic reaction to the donated (donor) platelets. ?Your body's disease-fighting system (immune system) attacking the donor platelets (hemolytic reaction). This is rare. ?A rare reaction that causes lung damage (transfusion-related acute lung injury). ?What happens before the procedure? ?Medicines ?Ask your health care provider about: ?Changing or stopping your regular medicines. This is especially important if you are taking diabetes medicines or blood thinners. ?Taking medicines such as aspirin and ibuprofen. These medicines can thin your blood. Do not take these medicines unless your health care provider tells you to take them. ?Taking over-the-counter medicines, vitamins, herbs, and supplements. ?General instructions ?You will have a blood test to determine your blood type. Your blood type determines what kind of platelets you will be given. ?Follow instructions from your health care provider  about eating or drinking restrictions. ?If you have had an allergic reaction to a transfusion in the past, you may be given medicine to help prevent a reaction. ?Your temperature, blood pressure, pulse, and breathing will be monitored. ?What happens during the procedure? ? ?An IV will be inserted into one of your veins. ?For your safety, two health care providers will verify your identity along with the donor platelets about to be infused. ?A bag of donor platelets will be connected to your IV. The platelets will flow into your bloodstream. This usually takes 30-60 minutes. ?Your temperature, blood pressure, pulse, and breathing will be monitored during the transfusion. This helps detect early signs of any reaction. ?You will also be monitored for other symptoms that may indicate a reaction, including chills, hives, or itching. ?If you have signs of a reaction at any time, your transfusion will be stopped, and you may be given medicine to help manage the reaction. ?When your transfusion is complete, your IV will be removed. ?Pressure may be applied to the IV site for a few minutes to stop any bleeding. ?The IV site will be covered with a bandage (dressing). ?The procedure may vary among health care providers and hospitals. ?What can I expect after the procedure? ?Your blood pressure, temperature, pulse, and breathing will be monitored until you leave the hospital or clinic. ?You may have some bruising and soreness at your IV site. ?Follow these instructions at home: ?Medicines ?Take over-the-counter and prescription medicines only as told by your health care provider. ?Talk with your health care provider before you take any medicines that contain aspirin or NSAIDs, such as ibuprofen. These medicines increase your risk for dangerous bleeding. ?IV site care ?Check your IV site every day for signs of infection. Check for: ?  Redness, swelling, or pain. ?Fluid or blood. If fluid or blood drains from your IV site, use your  hands to press down firmly on a bandage covering the area for a minute or two. Doing this should stop the bleeding. ?Warmth. ?Pus or a bad smell. ?General instructions ?Change or remove your dressing as told by your health care provider. ?Return to your normal activities as told by your health care provider. Ask your health care provider what activities are safe for you. ?Do not take baths, swim, or use a hot tub until your health care provider approves. Ask your health care provider if you may take showers. ?Keep all follow-up visits. This is important. ?Contact a health care provider if: ?You have a headache that does not go away with medicine. ?You have hives, rash, or itchy skin. ?You have nausea or vomiting. ?You feel unusually tired or weak. ?You have signs of infection at your IV site. ?Get help right away if: ?You have a fever or chills. ?You urinate less often than usual. ?Your urine is darker colored than normal. ?You have any of the following: ?Trouble breathing. ?Pain in your back, abdomen, or chest. ?Cool, clammy skin. ?A fast heartbeat. ?Summary ?Platelets are tiny pieces of blood cells that clump together to form a blood clot when you have an injury. If you have too few platelets, your blood may have trouble clotting. ?A platelet transfusion is a procedure in which you receive donated platelets through an IV. ?A platelet transfusion may be used to stop or prevent excessive bleeding. ?After the procedure, check your IV site every day for signs of infection. ?This information is not intended to replace advice given to you by your health care provider. Make sure you discuss any questions you have with your health care provider. ?Document Revised: 11/21/2020 Document Reviewed: 11/21/2020 ?Elsevier Patient Education ? 2022 Elsevier Inc. ? ?

## 2021-08-14 NOTE — Progress Notes (Signed)
Critical lab values of WBC 0.9  ANC 0.3 and Platelet count of 10,000  Dr Benay Spice reviewed and patient to get platelet transfusion today

## 2021-08-15 ENCOUNTER — Inpatient Hospital Stay: Payer: Medicare Other

## 2021-08-15 LAB — BPAM PLATELET PHERESIS
Blood Product Expiration Date: 202303172359
ISSUE DATE / TIME: 202303160943
Unit Type and Rh: 5100

## 2021-08-15 LAB — PREPARE PLATELET PHERESIS: Unit division: 0

## 2021-08-17 ENCOUNTER — Other Ambulatory Visit: Payer: Self-pay | Admitting: Oncology

## 2021-08-18 ENCOUNTER — Other Ambulatory Visit: Payer: Self-pay

## 2021-08-18 ENCOUNTER — Encounter: Payer: Self-pay | Admitting: Nurse Practitioner

## 2021-08-18 ENCOUNTER — Ambulatory Visit (HOSPITAL_BASED_OUTPATIENT_CLINIC_OR_DEPARTMENT_OTHER)
Admission: RE | Admit: 2021-08-18 | Discharge: 2021-08-18 | Disposition: A | Discharge status: Home / Self Care | Payer: Medicare Other | Source: Ambulatory Visit | Attending: Nurse Practitioner | Admitting: Nurse Practitioner

## 2021-08-18 ENCOUNTER — Encounter: Payer: Self-pay | Admitting: *Deleted

## 2021-08-18 ENCOUNTER — Other Ambulatory Visit: Payer: Self-pay | Admitting: *Deleted

## 2021-08-18 ENCOUNTER — Inpatient Hospital Stay: Payer: Medicare Other

## 2021-08-18 ENCOUNTER — Inpatient Hospital Stay (HOSPITAL_BASED_OUTPATIENT_CLINIC_OR_DEPARTMENT_OTHER): Payer: Medicare Other | Admitting: Nurse Practitioner

## 2021-08-18 VITALS — BP 116/66 | HR 68 | Temp 98.7°F | Resp 20 | Ht 72.0 in | Wt 147.8 lb

## 2021-08-18 DIAGNOSIS — C9200 Acute myeloblastic leukemia, not having achieved remission: Secondary | ICD-10-CM

## 2021-08-18 DIAGNOSIS — D696 Thrombocytopenia, unspecified: Secondary | ICD-10-CM

## 2021-08-18 LAB — CBC WITH DIFFERENTIAL (CANCER CENTER ONLY)
Abs Immature Granulocytes: 0 10*3/uL (ref 0.00–0.07)
Basophils Absolute: 0 10*3/uL (ref 0.0–0.1)
Basophils Relative: 0 %
Eosinophils Absolute: 0 10*3/uL (ref 0.0–0.5)
Eosinophils Relative: 2 %
HCT: 23.8 % — ABNORMAL LOW (ref 39.0–52.0)
Hemoglobin: 7.8 g/dL — ABNORMAL LOW (ref 13.0–17.0)
Immature Granulocytes: 0 %
Lymphocytes Relative: 63 %
Lymphs Abs: 0.4 10*3/uL — ABNORMAL LOW (ref 0.7–4.0)
MCH: 29.5 pg (ref 26.0–34.0)
MCHC: 32.8 g/dL (ref 30.0–36.0)
MCV: 90.2 fL (ref 80.0–100.0)
Monocytes Absolute: 0 10*3/uL — ABNORMAL LOW (ref 0.1–1.0)
Monocytes Relative: 5 %
Neutro Abs: 0.2 10*3/uL — CL (ref 1.7–7.7)
Neutrophils Relative %: 30 %
Platelet Count: 10 10*3/uL — ABNORMAL LOW (ref 150–400)
RBC: 2.64 MIL/uL — ABNORMAL LOW (ref 4.22–5.81)
RDW: 18.2 % — ABNORMAL HIGH (ref 11.5–15.5)
WBC Count: 0.6 10*3/uL — CL (ref 4.0–10.5)
nRBC: 0 % (ref 0.0–0.2)

## 2021-08-18 LAB — CMP (CANCER CENTER ONLY)
ALT: 24 U/L (ref 0–44)
AST: 20 U/L (ref 15–41)
Albumin: 3.8 g/dL (ref 3.5–5.0)
Alkaline Phosphatase: 81 U/L (ref 38–126)
Anion gap: 7 (ref 5–15)
BUN: 34 mg/dL — ABNORMAL HIGH (ref 8–23)
CO2: 25 mmol/L (ref 22–32)
Calcium: 9.3 mg/dL (ref 8.9–10.3)
Chloride: 106 mmol/L (ref 98–111)
Creatinine: 1.12 mg/dL (ref 0.61–1.24)
GFR, Estimated: >60 mL/min (ref >60)
Glucose, Bld: 94 mg/dL (ref 70–99)
Potassium: 4.5 mmol/L (ref 3.5–5.1)
Sodium: 138 mmol/L (ref 135–145)
Total Bilirubin: 1 mg/dL (ref 0.3–1.2)
Total Protein: 7.5 g/dL (ref 6.5–8.1)

## 2021-08-18 LAB — LACTATE DEHYDROGENASE: LDH: 125 U/L (ref 98–192)

## 2021-08-18 MED ORDER — LEVOFLOXACIN 500 MG PO TABS: 500.0000 mg | ORAL_TABLET | Freq: Every day | ORAL | 0 refills | Status: DC

## 2021-08-18 MED ORDER — SODIUM CHLORIDE 0.9% IV SOLUTION
250.0000 mL | Freq: Once | INTRAVENOUS | Status: AC
  Administered 2021-08-18: 250 mL via INTRAVENOUS

## 2021-08-18 NOTE — Progress Notes (Signed)
?[facility] ?OFFICE PROGRESS NOTE ? ? ?Diagnosis: Myelodysplasia ? ?INTERVAL HISTORY:  ? ?Mr. Canal returns as scheduled.  He completed cycle two 5-azacytidine beginning 07/14/2021.  He received a platelet transfusion 08/14/2021 for a platelet count of 10,000. ? ?He denies bleeding.  No fever.  He reports "cold" symptoms for about the past week.  He has a cough and mild shortness of breath.  He had a headache over the weekend.  No diplopia.  No falls or balance issues. ? ?Objective: ? ?Vital signs in last 24 hours: ? ?Blood pressure 116/66, pulse 68, temperature 98.7 ?F (37.1 ?C), temperature source Oral, resp. rate 20, height 6' (1.829 m), weight 147 lb 12.8 oz (67 kg), SpO2 100 %. ?  ? ?HEENT: No thrush or ulcers.  No bleeding in the oral cavity. ?Resp: Scattered rhonchi.  No respiratory distress. ?Cardio: Regular rate and rhythm. ?GI: No hepatosplenomegaly. ?Vascular: No leg edema. ?Skin: Ecchymoses bilateral forearm, dorsum of hand.  Tiny ecchymoses at the lower legs bilaterally. ? ? ?Lab Results: ? ?Lab Results  ?Component Value Date  ? WBC 0.9 (LL) 08/14/2021  ? HGB 7.9 (L) 08/14/2021  ? HCT 24.4 (L) 08/14/2021  ? MCV 90.7 08/14/2021  ? PLT 10 (L) 08/14/2021  ? NEUTROABS 0.3 (LL) 08/14/2021  ? ? ?Imaging: ? ?No results found. ? ?Medications: I have reviewed the patient's current medications. ? ?Assessment/Plan: ?Sigmoid colon adenocarcinoma ?-Colonoscopy 10/21/2020- partially obstructing mass found in the sigmoid colon consistent with adenocarcinoma. ?-CEA on 10/22/2020 was 3.9 ?-CTs 10/22/2020-colonic mass with pelvic lymphadenopathy including right common iliac and external iliac nodes, small pulmonary nodules ?-10/24/2020-sigmoid colectomy, end colostomy, tumor stuck to pelvic sidewall with rind of tissue remaining at completion of surgery ?-Pathology- moderately differentiated adenocarcinoma the sigmoid colon, tumor extends into pericolonic connective tissue, no lymphovascular perineural  invasion, 0/14 lymph nodes, carcinoma involves an area with the specimen was disrupted at mesenteric margin; mismatch repair protein (IHC) normal; MSI stable ?-CTs 11/24/2020-interval resection of colonic mass, decreased size of previously noted right pelvic adenopathy, new noted at the right internal/external iliac bifurcation, unchanged pulmonary nodules ?-Guardant reveal 12/06/2020-ctDNA not detected ?-Cycle 1 adjuvant Xeloda anticipated 01/06/2021 ?-Cycle 2 adjuvant Xeloda 01/27/2021, Xeloda dose reduced secondary to thrombocytopenia (he took 1 pill a day for an unclear amount of time, less than 14 days) ?-Cycle 3 adjuvant Xeloda 02/17/2021 ?2.  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 ?3.  Thrombocytopenia-chronic ?4.  Atrial flutter ?5.  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  COPD ?7.  Hypertension ?8.  Diabetes mellitus ?9.  Dementia ?10.  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  ?13.  Admission 06/05/2021 with presyncope ?14.  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   ? ?Disposition: Mr. Maselli has completed 2 cycles of 5-azacytidine.  Cycle  3 has been on hold due to low blood counts.  He has persistent severe pancytopenia.  He will receive a platelet transfusion today.  We are obtaining a chest x-ray due to a recent cough.  He will begin Levaquin 500 mg daily.  He will return for a repeat CBC 08/21/2021, transfusion support as indicated.  We will see him in follow-up in 1 week with the plan to proceed with another cycle of 5-azacytidine if he is otherwise stable.  He understands to contact the office in the interim with fever, signs of infection. ? ?Patient seen with Dr. Benay Spice. ? ? ? ?Ned Card ANP/GNP-BC  ? ?08/18/2021  ?10:24 AM ? ?This was a shared visit with Ned Card.  Mr. Rosas appears unchanged, but he has persistent severe pancytopenia.  He has completed 2 cycles of 5-azacytidine without hematologic improvement.  He continues to require transfusion support.  He has severe neutropenia today.  He will begin Levaquin prophylaxis.  We will delay chemotherapy an additional week. ?I discussed treatment options with him.  We discussed a referral to the leukemia service at [facility] since he does not appear to be responding to the 5-azacytidine.  He will think about this. ? ?I was present for greater than 50% of today's visit.  I performed medical decision making. ? ?Julieanne Manson, MD ? ? ? ? ? ?

## 2021-08-18 NOTE — Progress Notes (Signed)
[name] given contact number for [employer] supplier set up by [place] for [name] [phone] and instructed [name] or daughter in law to call and discuss shipment of supplies. ?Verbalized understanding ?

## 2021-08-18 NOTE — Patient Instructions (Signed)
Platelet Transfusion ?A platelet transfusion is a procedure in which a person receives donated platelets through an IV. Platelets are parts of blood that stick together and form a clot to help the body stop bleeding after an injury. If you have too few platelets, your blood may have trouble clotting. This may cause you to bleed and bruise very easily. ?You may need a platelet transfusion if you have a condition that causes a low number of platelets (thrombocytopenia). A platelet transfusion may be used to stop or prevent excessive bleeding. ?Tell a health care provider about: ?Any reactions you have had during previous transfusions. ?Any allergies you have. ?All medicines you are taking, including vitamins, herbs, eye drops, creams, and over-the-counter medicines. ?Any bleeding problems you have. ?Any surgeries you have had. ?Any medical conditions you have. ?Whether you are pregnant or may be pregnant. ?What are the risks? ?Generally, this is a safe procedure. However, problems may occur, including: ?Fever. ?Infection. ?Allergic reaction to the donated (donor) platelets. ?Your body's disease-fighting system (immune system) attacking the donor platelets (hemolytic reaction). This is rare. ?A rare reaction that causes lung damage (transfusion-related acute lung injury). ?What happens before the procedure? ?Medicines ?Ask your health care provider about: ?Changing or stopping your regular medicines. This is especially important if you are taking diabetes medicines or blood thinners. ?Taking medicines such as aspirin and ibuprofen. These medicines can thin your blood. Do not take these medicines unless your health care provider tells you to take them. ?Taking over-the-counter medicines, vitamins, herbs, and supplements. ?General instructions ?You will have a blood test to determine your blood type. Your blood type determines what kind of platelets you will be given. ?Follow instructions from your health care provider  about eating or drinking restrictions. ?If you have had an allergic reaction to a transfusion in the past, you may be given medicine to help prevent a reaction. ?Your temperature, blood pressure, pulse, and breathing will be monitored. ?What happens during the procedure? ? ?An IV will be inserted into one of your veins. ?For your safety, two health care providers will verify your identity along with the donor platelets about to be infused. ?A bag of donor platelets will be connected to your IV. The platelets will flow into your bloodstream. This usually takes 30-60 minutes. ?Your temperature, blood pressure, pulse, and breathing will be monitored during the transfusion. This helps detect early signs of any reaction. ?You will also be monitored for other symptoms that may indicate a reaction, including chills, hives, or itching. ?If you have signs of a reaction at any time, your transfusion will be stopped, and you may be given medicine to help manage the reaction. ?When your transfusion is complete, your IV will be removed. ?Pressure may be applied to the IV site for a few minutes to stop any bleeding. ?The IV site will be covered with a bandage (dressing). ?The procedure may vary among health care providers and hospitals. ?What can I expect after the procedure? ?Your blood pressure, temperature, pulse, and breathing will be monitored until you leave the hospital or clinic. ?You may have some bruising and soreness at your IV site. ?Follow these instructions at home: ?Medicines ?Take over-the-counter and prescription medicines only as told by your health care provider. ?Talk with your health care provider before you take any medicines that contain aspirin or NSAIDs, such as ibuprofen. These medicines increase your risk for dangerous bleeding. ?IV site care ?Check your IV site every day for signs of infection. Check for: ?  Redness, swelling, or pain. ?Fluid or blood. If fluid or blood drains from your IV site, use your  hands to press down firmly on a bandage covering the area for a minute or two. Doing this should stop the bleeding. ?Warmth. ?Pus or a bad smell. ?General instructions ?Change or remove your dressing as told by your health care provider. ?Return to your normal activities as told by your health care provider. Ask your health care provider what activities are safe for you. ?Do not take baths, swim, or use a hot tub until your health care provider approves. Ask your health care provider if you may take showers. ?Keep all follow-up visits. This is important. ?Contact a health care provider if: ?You have a headache that does not go away with medicine. ?You have hives, rash, or itchy skin. ?You have nausea or vomiting. ?You feel unusually tired or weak. ?You have signs of infection at your IV site. ?Get help right away if: ?You have a fever or chills. ?You urinate less often than usual. ?Your urine is darker colored than normal. ?You have any of the following: ?Trouble breathing. ?Pain in your back, abdomen, or chest. ?Cool, clammy skin. ?A fast heartbeat. ?Summary ?Platelets are tiny pieces of blood cells that clump together to form a blood clot when you have an injury. If you have too few platelets, your blood may have trouble clotting. ?A platelet transfusion is a procedure in which you receive donated platelets through an IV. ?A platelet transfusion may be used to stop or prevent excessive bleeding. ?After the procedure, check your IV site every day for signs of infection. ?This information is not intended to replace advice given to you by your health care provider. Make sure you discuss any questions you have with your health care provider. ?Document Revised: 11/21/2020 Document Reviewed: 11/21/2020 ?Elsevier Patient Education ? 2022 Elsevier Inc. ? ?

## 2021-08-18 NOTE — Progress Notes (Signed)
Platelet orders placed for today. ?

## 2021-08-19 ENCOUNTER — Inpatient Hospital Stay: Payer: Medicare Other

## 2021-08-19 LAB — PREPARE PLATELET PHERESIS: Unit division: 0

## 2021-08-19 LAB — BPAM PLATELET PHERESIS
Blood Product Expiration Date: 202303222359
ISSUE DATE / TIME: 202303201109
Unit Type and Rh: 7300

## 2021-08-20 ENCOUNTER — Inpatient Hospital Stay: Payer: Medicare Other

## 2021-08-21 ENCOUNTER — Other Ambulatory Visit: Payer: Self-pay | Admitting: *Deleted

## 2021-08-21 ENCOUNTER — Inpatient Hospital Stay: Payer: Medicare Other

## 2021-08-21 ENCOUNTER — Other Ambulatory Visit: Payer: Self-pay

## 2021-08-21 ENCOUNTER — Encounter: Payer: Self-pay | Admitting: *Deleted

## 2021-08-21 DIAGNOSIS — C9200 Acute myeloblastic leukemia, not having achieved remission: Secondary | ICD-10-CM | POA: Diagnosis not present

## 2021-08-21 DIAGNOSIS — D696 Thrombocytopenia, unspecified: Secondary | ICD-10-CM

## 2021-08-21 LAB — CBC WITH DIFFERENTIAL (CANCER CENTER ONLY)
Abs Immature Granulocytes: 0.01 10*3/uL (ref 0.00–0.07)
Basophils Absolute: 0 10*3/uL (ref 0.0–0.1)
Basophils Relative: 0 %
Eosinophils Absolute: 0 10*3/uL (ref 0.0–0.5)
Eosinophils Relative: 0 %
HCT: 22 % — ABNORMAL LOW (ref 39.0–52.0)
Hemoglobin: 7.4 g/dL — ABNORMAL LOW (ref 13.0–17.0)
Immature Granulocytes: 2 %
Lymphocytes Relative: 67 %
Lymphs Abs: 0.4 10*3/uL — ABNORMAL LOW (ref 0.7–4.0)
MCH: 30.3 pg (ref 26.0–34.0)
MCHC: 33.6 g/dL (ref 30.0–36.0)
MCV: 90.2 fL (ref 80.0–100.0)
Monocytes Absolute: 0 10*3/uL — ABNORMAL LOW (ref 0.1–1.0)
Monocytes Relative: 4 %
Neutro Abs: 0.1 10*3/uL — CL (ref 1.7–7.7)
Neutrophils Relative %: 27 %
Platelet Count: 9 10*3/uL — CL (ref 150–400)
RBC: 2.44 MIL/uL — ABNORMAL LOW (ref 4.22–5.81)
RDW: 19.1 % — ABNORMAL HIGH (ref 11.5–15.5)
WBC Count: 0.5 10*3/uL — CL (ref 4.0–10.5)
nRBC: 0 % (ref 0.0–0.2)

## 2021-08-21 LAB — SAMPLE TO BLOOD BANK

## 2021-08-21 MED ORDER — SODIUM CHLORIDE 0.9% IV SOLUTION
250.0000 mL | Freq: Once | INTRAVENOUS | Status: AC
  Administered 2021-08-21: 250 mL via INTRAVENOUS

## 2021-08-21 MED ORDER — SODIUM CHLORIDE 0.9% FLUSH: 10.0000 mL | INTRAVENOUS | Status: DC | PRN

## 2021-08-21 NOTE — Progress Notes (Signed)
CRITICAL VALUE STICKER ? ?CRITICAL VALUE: WBC 0.5, ANC 0.1, platelets 9,000 and Hgb 7.4 ? ?RECEIVER (on-site recipient of call):Shawndale Kilpatrick,RN ? ?DATE & TIME NOTIFIED: 08/21/21 @ 1119 ? ?MESSENGER (representative from lab):Simona Huh ? ?MD NOTIFIED: Dr. Benay Spice ? ?TIME OF NOTIFICATION:1130 ? ?RESPONSE: Transfuse 1 unit platelets today and 2 units blood tomorrow. Maintain neutropenic precautions. ?

## 2021-08-21 NOTE — Patient Instructions (Signed)
Platelet Transfusion ?A platelet transfusion is a procedure in which a person receives donated platelets through an IV. Platelets are parts of blood that stick together and form a clot to help the body stop bleeding after an injury. If you have too few platelets, your blood may have trouble clotting. This may cause you to bleed and bruise very easily. ?You may need a platelet transfusion if you have a condition that causes a low number of platelets (thrombocytopenia). A platelet transfusion may be used to stop or prevent excessive bleeding. ?Tell a health care provider about: ?Any reactions you have had during previous transfusions. ?Any allergies you have. ?All medicines you are taking, including vitamins, herbs, eye drops, creams, and over-the-counter medicines. ?Any bleeding problems you have. ?Any surgeries you have had. ?Any medical conditions you have. ?Whether you are pregnant or may be pregnant. ?What are the risks? ?Generally, this is a safe procedure. However, problems may occur, including: ?Fever. ?Infection. ?Allergic reaction to the donated (donor) platelets. ?Your body's disease-fighting system (immune system) attacking the donor platelets (hemolytic reaction). This is rare. ?A rare reaction that causes lung damage (transfusion-related acute lung injury). ?What happens before the procedure? ?Medicines ?Ask your health care provider about: ?Changing or stopping your regular medicines. This is especially important if you are taking diabetes medicines or blood thinners. ?Taking medicines such as aspirin and ibuprofen. These medicines can thin your blood. Do not take these medicines unless your health care provider tells you to take them. ?Taking over-the-counter medicines, vitamins, herbs, and supplements. ?General instructions ?You will have a blood test to determine your blood type. Your blood type determines what kind of platelets you will be given. ?Follow instructions from your health care provider  about eating or drinking restrictions. ?If you have had an allergic reaction to a transfusion in the past, you may be given medicine to help prevent a reaction. ?Your temperature, blood pressure, pulse, and breathing will be monitored. ?What happens during the procedure? ? ?An IV will be inserted into one of your veins. ?For your safety, two health care providers will verify your identity along with the donor platelets about to be infused. ?A bag of donor platelets will be connected to your IV. The platelets will flow into your bloodstream. This usually takes 30-60 minutes. ?Your temperature, blood pressure, pulse, and breathing will be monitored during the transfusion. This helps detect early signs of any reaction. ?You will also be monitored for other symptoms that may indicate a reaction, including chills, hives, or itching. ?If you have signs of a reaction at any time, your transfusion will be stopped, and you may be given medicine to help manage the reaction. ?When your transfusion is complete, your IV will be removed. ?Pressure may be applied to the IV site for a few minutes to stop any bleeding. ?The IV site will be covered with a bandage (dressing). ?The procedure may vary among health care providers and hospitals. ?What can I expect after the procedure? ?Your blood pressure, temperature, pulse, and breathing will be monitored until you leave the hospital or clinic. ?You may have some bruising and soreness at your IV site. ?Follow these instructions at home: ?Medicines ?Take over-the-counter and prescription medicines only as told by your health care provider. ?Talk with your health care provider before you take any medicines that contain aspirin or NSAIDs, such as ibuprofen. These medicines increase your risk for dangerous bleeding. ?IV site care ?Check your IV site every day for signs of infection. Check for: ?  Redness, swelling, or pain. ?Fluid or blood. If fluid or blood drains from your IV site, use your  hands to press down firmly on a bandage covering the area for a minute or two. Doing this should stop the bleeding. ?Warmth. ?Pus or a bad smell. ?General instructions ?Change or remove your dressing as told by your health care provider. ?Return to your normal activities as told by your health care provider. Ask your health care provider what activities are safe for you. ?Do not take baths, swim, or use a hot tub until your health care provider approves. Ask your health care provider if you may take showers. ?Keep all follow-up visits. This is important. ?Contact a health care provider if: ?You have a headache that does not go away with medicine. ?You have hives, rash, or itchy skin. ?You have nausea or vomiting. ?You feel unusually tired or weak. ?You have signs of infection at your IV site. ?Get help right away if: ?You have a fever or chills. ?You urinate less often than usual. ?Your urine is darker colored than normal. ?You have any of the following: ?Trouble breathing. ?Pain in your back, abdomen, or chest. ?Cool, clammy skin. ?A fast heartbeat. ?Summary ?Platelets are tiny pieces of blood cells that clump together to form a blood clot when you have an injury. If you have too few platelets, your blood may have trouble clotting. ?A platelet transfusion is a procedure in which you receive donated platelets through an IV. ?A platelet transfusion may be used to stop or prevent excessive bleeding. ?After the procedure, check your IV site every day for signs of infection. ?This information is not intended to replace advice given to you by your health care provider. Make sure you discuss any questions you have with your health care provider. ?Document Revised: 11/21/2020 Document Reviewed: 11/21/2020 ?Elsevier Patient Education ? 2022 Elsevier Inc. ? ?

## 2021-08-21 NOTE — Progress Notes (Signed)
Orders for [employer] placed again. Blood bank could not see them ? ?

## 2021-08-21 NOTE — Progress Notes (Signed)
Placed orders for 2 units blood tomorrow and blood bank notified. Patient informed of arrival time and to keep blue armband on. [facility] called for pick up. ?

## 2021-08-22 ENCOUNTER — Inpatient Hospital Stay: Payer: Medicare Other

## 2021-08-22 DIAGNOSIS — C9200 Acute myeloblastic leukemia, not having achieved remission: Secondary | ICD-10-CM

## 2021-08-22 LAB — BPAM PLATELET PHERESIS
Blood Product Expiration Date: 202303242359
ISSUE DATE / TIME: 202303231144
Unit Type and Rh: 7300

## 2021-08-22 LAB — PREPARE PLATELET PHERESIS: Unit division: 0

## 2021-08-22 LAB — PREPARE RBC (CROSSMATCH)

## 2021-08-22 MED ORDER — SODIUM CHLORIDE 0.9% IV SOLUTION
250.0000 mL | Freq: Once | INTRAVENOUS | Status: AC
  Administered 2021-08-22: 250 mL via INTRAVENOUS

## 2021-08-22 NOTE — Patient Instructions (Signed)
Blood Transfusion, Adult, Care After This sheet gives you information about how to care for yourself after your procedure. Your doctor may also give you more specific instructions. If you have problems or questions, contact your doctor. What can I expect after the procedure? After the procedure, it is common to have: Bruising and soreness at the IV site. A headache. Follow these instructions at home: Insertion site care   Follow instructions from your doctor about how to take care of your insertion site. This is where an IV tube was put into your vein. Make sure you: Wash your hands with soap and water before and after you change your bandage (dressing). If you cannot use soap and water, use hand sanitizer. Change your bandage as told by your doctor. Check your insertion site every day for signs of infection. Check for: Redness, swelling, or pain. Bleeding from the site. Warmth. Pus or a bad smell. General instructions Take over-the-counter and prescription medicines only as told by your doctor. Rest as told by your doctor. Go back to your normal activities as told by your doctor. Keep all follow-up visits as told by your doctor. This is important. Contact a doctor if: You have itching or red, swollen areas of skin (hives). You feel worried or nervous (anxious). You feel weak after doing your normal activities. You have redness, swelling, warmth, or pain around the insertion site. You have blood coming from the insertion site, and the blood does not stop with pressure. You have pus or a bad smell coming from the insertion site. Get help right away if: You have signs of a serious reaction. This may be coming from an allergy or the body's defense system (immune system). Signs include: Trouble breathing or shortness of breath. Swelling of the face or feeling warm (flushed). Fever or chills. Head, chest, or back pain. Dark pee (urine) or blood in the pee. Widespread rash. Fast  heartbeat. Feeling dizzy or light-headed. You may receive your blood transfusion in an outpatient setting. If so, you will be told whom to contact to report any reactions. These symptoms may be an emergency. Do not wait to see if the symptoms will go away. Get medical help right away. Call your local emergency services (911 in the [place] Do not drive yourself to the hospital. Summary Bruising and soreness at the IV site are common. Check your insertion site every day for signs of infection. Rest as told by your doctor. Go back to your normal activities as told by your doctor. Get help right away if you have signs of a serious reaction. This information is not intended to replace advice given to you by your health care provider. Make sure you discuss any questions you have with your health care provider. Document Revised: 09/12/2020 Document Reviewed: 11/10/2018 Elsevier Patient Education  2022 Elsevier Inc.  

## 2021-08-23 LAB — TYPE AND SCREEN
ABO/RH(D): AB POS
Antibody Screen: NEGATIVE
Unit division: 0
Unit division: 0

## 2021-08-23 LAB — BPAM RBC
Blood Product Expiration Date: 202304202359
Blood Product Expiration Date: 202304202359
ISSUE DATE / TIME: 202303240805
ISSUE DATE / TIME: 202303240805
Unit Type and Rh: 5100
Unit Type and Rh: 5100

## 2021-08-25 ENCOUNTER — Other Ambulatory Visit: Payer: Self-pay | Admitting: *Deleted

## 2021-08-25 ENCOUNTER — Telehealth: Payer: Self-pay

## 2021-08-25 ENCOUNTER — Inpatient Hospital Stay: Payer: Medicare Other

## 2021-08-25 ENCOUNTER — Inpatient Hospital Stay (HOSPITAL_BASED_OUTPATIENT_CLINIC_OR_DEPARTMENT_OTHER): Payer: Medicare Other | Admitting: Nurse Practitioner

## 2021-08-25 ENCOUNTER — Other Ambulatory Visit: Payer: Self-pay

## 2021-08-25 ENCOUNTER — Encounter: Payer: Self-pay | Admitting: *Deleted

## 2021-08-25 ENCOUNTER — Encounter: Payer: Self-pay | Admitting: Nurse Practitioner

## 2021-08-25 VITALS — BP 125/66 | HR 100 | Temp 97.7°F | Resp 18 | Ht 72.0 in | Wt 152.2 lb

## 2021-08-25 DIAGNOSIS — C9200 Acute myeloblastic leukemia, not having achieved remission: Secondary | ICD-10-CM

## 2021-08-25 LAB — CBC WITH DIFFERENTIAL (CANCER CENTER ONLY)
Abs Immature Granulocytes: 0 10*3/uL (ref 0.00–0.07)
Basophils Absolute: 0 10*3/uL (ref 0.0–0.1)
Basophils Relative: 0 %
Eosinophils Absolute: 0 10*3/uL (ref 0.0–0.5)
Eosinophils Relative: 2 %
HCT: 25.3 % — ABNORMAL LOW (ref 39.0–52.0)
Hemoglobin: 8.4 g/dL — ABNORMAL LOW (ref 13.0–17.0)
Immature Granulocytes: 0 %
Lymphocytes Relative: 62 %
Lymphs Abs: 0.4 10*3/uL — ABNORMAL LOW (ref 0.7–4.0)
MCH: 30.1 pg (ref 26.0–34.0)
MCHC: 33.2 g/dL (ref 30.0–36.0)
MCV: 90.7 fL (ref 80.0–100.0)
Monocytes Absolute: 0 10*3/uL — ABNORMAL LOW (ref 0.1–1.0)
Monocytes Relative: 3 %
Neutro Abs: 0.2 10*3/uL — CL (ref 1.7–7.7)
Neutrophils Relative %: 33 %
Platelet Count: 13 10*3/uL — ABNORMAL LOW (ref 150–400)
RBC: 2.79 MIL/uL — ABNORMAL LOW (ref 4.22–5.81)
RDW: 17.5 % — ABNORMAL HIGH (ref 11.5–15.5)
WBC Count: 0.7 10*3/uL — CL (ref 4.0–10.5)
nRBC: 0 % (ref 0.0–0.2)

## 2021-08-25 LAB — CMP (CANCER CENTER ONLY)
ALT: 102 U/L — ABNORMAL HIGH (ref 0–44)
AST: 79 U/L — ABNORMAL HIGH (ref 15–41)
Albumin: 3.6 g/dL (ref 3.5–5.0)
Alkaline Phosphatase: 87 U/L (ref 38–126)
Anion gap: 7 (ref 5–15)
BUN: 31 mg/dL — ABNORMAL HIGH (ref 8–23)
CO2: 26 mmol/L (ref 22–32)
Calcium: 9.1 mg/dL (ref 8.9–10.3)
Chloride: 108 mmol/L (ref 98–111)
Creatinine: 0.99 mg/dL (ref 0.61–1.24)
GFR, Estimated: >60 mL/min (ref >60)
Glucose, Bld: 93 mg/dL (ref 70–99)
Potassium: 4.3 mmol/L (ref 3.5–5.1)
Sodium: 141 mmol/L (ref 135–145)
Total Bilirubin: 1.1 mg/dL (ref 0.3–1.2)
Total Protein: 7.4 g/dL (ref 6.5–8.1)

## 2021-08-25 LAB — SAVE SMEAR(SSMR), FOR PROVIDER SLIDE REVIEW

## 2021-08-25 LAB — LACTATE DEHYDROGENASE: LDH: 179 U/L (ref 98–192)

## 2021-08-25 MED ORDER — LEVOFLOXACIN 500 MG PO TABS: 500.0000 mg | ORAL_TABLET | Freq: Every day | ORAL | 2 refills | Status: AC

## 2021-08-25 NOTE — Progress Notes (Signed)
Patient seen by Ned Card NP today ? ?Vitals are within treatment parameters. ? ?Labs reviewed by Ned Card NP and are not all within treatment parameters. See below... ? ?Per physician team, patient will not be receiving treatment today.  ? Will not have treatment this week ?CRITICAL VALUE STICKER ? ?CRITICAL VALUE:WBC 0.7, ANC 0.51mplatelet 13,000 ? ?RECEIVER (on-site recipient of call):Narcisa Ganesh,RN ? ?DATE & TIME NOTIFIED: 08/25/21 @ 1158 ? ?MESSENGER (representative from lab):Phyllis ? ?MD NOTIFIED: Dr. SBenay Spice? ?TIME OF NOTIFICATION:1203 ? ?RESPONSE:  ? ?

## 2021-08-25 NOTE — Progress Notes (Signed)
?[facility] ?OFFICE PROGRESS NOTE ? ? ?Diagnosis: Myelodysplasia ? ?INTERVAL HISTORY:  ? ?[name] returns as scheduled.  He completed cycle two 5-azacytidine beginning 07/14/2021.  There has been no significant improvement in blood counts.  He was transfused 2 units of blood 08/22/2021 and a unit of platelets on 08/21/2021. ? ?Objective: ? ?Vital signs in last 24 hours: ? ?Blood pressure 125/66, pulse 100, temperature 97.7 ?F (36.5 ?C), temperature source Oral, resp. rate 18, height 6' (1.829 m), weight 152 lb 3.2 oz (69 kg), SpO2 100 %. ?  ? ?HEENT: No thrush or ulcers. ?Resp: Bilateral rhonchi.  No respiratory distress. ?Cardio: Regular rate and rhythm. ?GI: No hepatosplenomegaly.  Left lower quadrant colostomy.  Brown stool in the collection bag. ?Vascular: No leg edema. ?Skin: Petechiae, small ecchymoses at the lower legs. ? ? ?Lab Results: ? ?Lab Results  ?Component Value Date  ? WBC 0.7 (LL) 08/25/2021  ? HGB 8.4 (L) 08/25/2021  ? HCT 25.3 (L) 08/25/2021  ? MCV 90.7 08/25/2021  ? PLT 13 (L) 08/25/2021  ? NEUTROABS 0.2 (LL) 08/25/2021  ? ? ?Imaging: ? ?No results found. ? ?Medications: I have reviewed the patient's current medications. ? ?Assessment/Plan: ?Sigmoid colon adenocarcinoma ?-Colonoscopy 10/21/2020- partially obstructing mass found in the sigmoid colon consistent with adenocarcinoma. ?-CEA on 10/22/2020 was 3.9 ?-CTs 10/22/2020-colonic mass with pelvic lymphadenopathy including right common iliac and external iliac nodes, small pulmonary nodules ?-10/24/2020-sigmoid colectomy, end colostomy, tumor stuck to pelvic sidewall with rind of tissue remaining at completion of surgery ?-Pathology- moderately differentiated adenocarcinoma the sigmoid colon, tumor extends into pericolonic connective tissue, no lymphovascular perineural invasion, 0/14 lymph nodes, carcinoma involves an area with the specimen was disrupted at mesenteric margin; mismatch repair protein (IHC) normal; MSI stable ?-CTs  11/24/2020-interval resection of colonic mass, decreased size of previously noted right pelvic adenopathy, new noted at the right internal/external iliac bifurcation, unchanged pulmonary nodules ?-Guardant reveal 12/06/2020-ctDNA not detected ?-Cycle 1 adjuvant Xeloda anticipated 01/06/2021 ?-Cycle 2 adjuvant Xeloda 01/27/2021, Xeloda dose reduced secondary to thrombocytopenia (he took 1 pill a day for an unclear amount of time, less than 14 days) ?-Cycle 3 adjuvant Xeloda 02/17/2021 ?2.  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 ?3.  Thrombocytopenia-chronic ?4.  Atrial flutter ?5.  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  COPD ?7.  Hypertension ?8.  Diabetes mellitus ?9.  Dementia ?10.  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  ?13.  Admission 06/05/2021 with presyncope ?14.  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  Elevated LFTs 08/25/2021 ?  ? ?Disposition: [name] appears unchanged.  He has completed 2 cycles of 5-azacytidine.  Counts remain low.  Plan to continue to hold  further 5-azacytidine and refer for a restaging bone marrow biopsy.  He agrees with this plan.  He again declines referral to the leukemia service at [facility]. We will continue to check labs on a Monday Thursday schedule.  He will return for follow-up in about 10 days.  He understands to contact the office with fever, bleeding. ? ?CBC and chemistry panel from today reviewed.  No blood or platelets needed today.  Transaminases newly elevated.  We will continue to monitor. ? ?Patient seen with Dr. Benay Spice. ? ? ? ?Ned Card ANP/GNP-BC  ? ?08/25/2021  ?12:21 PM ?This was a shared visit with Ned Card.  [name] appears unchanged.  He has persistent severe pancytopenia.  He will be referred for a restaging bone marrow biopsy prior to deciding on further therapy. ? ?He will continue antibiotic prophylaxis and he will receive platelets/Red cell transfusion support as needed. ? ?I was present for greater than 50% of today's visit.  I performed medical decision making. ? ?Julieanne Manson, MD ? ? ? ? ? ? ?

## 2021-08-25 NOTE — Telephone Encounter (Signed)
CT Bone Marrow Biopsy is schedule at [facility] on 09/08/21 at 9 the patient need to be there at 7. The is son is aware of the appointment. ?

## 2021-08-26 ENCOUNTER — Inpatient Hospital Stay: Payer: Medicare Other

## 2021-08-27 ENCOUNTER — Inpatient Hospital Stay: Payer: Medicare Other

## 2021-08-28 ENCOUNTER — Inpatient Hospital Stay: Payer: Medicare Other

## 2021-08-28 ENCOUNTER — Telehealth: Payer: Self-pay

## 2021-08-28 DIAGNOSIS — C9200 Acute myeloblastic leukemia, not having achieved remission: Secondary | ICD-10-CM | POA: Diagnosis not present

## 2021-08-28 LAB — CBC WITH DIFFERENTIAL (CANCER CENTER ONLY)
Abs Immature Granulocytes: 0 10*3/uL (ref 0.00–0.07)
Basophils Absolute: 0 10*3/uL (ref 0.0–0.1)
Basophils Relative: 0 %
Eosinophils Absolute: 0 10*3/uL (ref 0.0–0.5)
Eosinophils Relative: 2 %
HCT: 23.6 % — ABNORMAL LOW (ref 39.0–52.0)
Hemoglobin: 7.8 g/dL — ABNORMAL LOW (ref 13.0–17.0)
Immature Granulocytes: 0 %
Lymphocytes Relative: 69 %
Lymphs Abs: 0.4 10*3/uL — ABNORMAL LOW (ref 0.7–4.0)
MCH: 29.7 pg (ref 26.0–34.0)
MCHC: 33.1 g/dL (ref 30.0–36.0)
MCV: 89.7 fL (ref 80.0–100.0)
Monocytes Absolute: 0 10*3/uL — ABNORMAL LOW (ref 0.1–1.0)
Monocytes Relative: 3 %
Neutro Abs: 0.2 10*3/uL — CL (ref 1.7–7.7)
Neutrophils Relative %: 26 %
Platelet Count: 12 10*3/uL — ABNORMAL LOW (ref 150–400)
RBC: 2.63 MIL/uL — ABNORMAL LOW (ref 4.22–5.81)
RDW: 17.2 % — ABNORMAL HIGH (ref 11.5–15.5)
WBC Count: 0.6 10*3/uL — CL (ref 4.0–10.5)
nRBC: 0 % (ref 0.0–0.2)

## 2021-08-28 LAB — SAMPLE TO BLOOD BANK

## 2021-08-28 NOTE — Telephone Encounter (Signed)
CRITICAL VALUE STICKER ? ?CRITICAL VALUE: WBC 0.6 ANC 0.2. PLT 12,000 HGB 7.8 ?RECEIVER (on-site recipient of call): ? ?DATE & TIME NOTIFIED: 08/28/21 11:29  ? ?MESSENGER (representative from lab): Otila Kluver  ? ?MD NOTIFIED: Dr. Benay Spice  ? ?TIME OF NOTIFICATION: 5953 ? ?RESPONSE: Pt will not receive any Platelets or blood per Dr Benay Spice. Lab scheduled for 4/3 ? ?

## 2021-09-01 ENCOUNTER — Encounter: Payer: Self-pay | Admitting: *Deleted

## 2021-09-01 ENCOUNTER — Inpatient Hospital Stay: Payer: Medicare Other | Attending: Nurse Practitioner

## 2021-09-01 DIAGNOSIS — E119 Type 2 diabetes mellitus without complications: Secondary | ICD-10-CM | POA: Insufficient documentation

## 2021-09-01 DIAGNOSIS — F17200 Nicotine dependence, unspecified, uncomplicated: Secondary | ICD-10-CM | POA: Diagnosis not present

## 2021-09-01 DIAGNOSIS — D469 Myelodysplastic syndrome, unspecified: Secondary | ICD-10-CM | POA: Diagnosis not present

## 2021-09-01 DIAGNOSIS — D696 Thrombocytopenia, unspecified: Secondary | ICD-10-CM | POA: Diagnosis not present

## 2021-09-01 DIAGNOSIS — J449 Chronic obstructive pulmonary disease, unspecified: Secondary | ICD-10-CM | POA: Diagnosis not present

## 2021-09-01 DIAGNOSIS — C187 Malignant neoplasm of sigmoid colon: Secondary | ICD-10-CM | POA: Diagnosis not present

## 2021-09-01 DIAGNOSIS — D61818 Other pancytopenia: Secondary | ICD-10-CM | POA: Diagnosis not present

## 2021-09-01 DIAGNOSIS — I10 Essential (primary) hypertension: Secondary | ICD-10-CM | POA: Diagnosis not present

## 2021-09-01 DIAGNOSIS — I4892 Unspecified atrial flutter: Secondary | ICD-10-CM | POA: Diagnosis not present

## 2021-09-01 DIAGNOSIS — F0390 Unspecified dementia without behavioral disturbance: Secondary | ICD-10-CM | POA: Diagnosis not present

## 2021-09-01 DIAGNOSIS — D509 Iron deficiency anemia, unspecified: Secondary | ICD-10-CM | POA: Diagnosis not present

## 2021-09-01 DIAGNOSIS — C9200 Acute myeloblastic leukemia, not having achieved remission: Secondary | ICD-10-CM

## 2021-09-01 DIAGNOSIS — R7989 Other specified abnormal findings of blood chemistry: Secondary | ICD-10-CM | POA: Diagnosis not present

## 2021-09-01 LAB — CBC WITH DIFFERENTIAL (CANCER CENTER ONLY)
Abs Immature Granulocytes: 0 10*3/uL (ref 0.00–0.07)
Basophils Absolute: 0 10*3/uL (ref 0.0–0.1)
Basophils Relative: 0 %
Eosinophils Absolute: 0 10*3/uL (ref 0.0–0.5)
Eosinophils Relative: 2 %
HCT: 24.6 % — ABNORMAL LOW (ref 39.0–52.0)
Hemoglobin: 8.1 g/dL — ABNORMAL LOW (ref 13.0–17.0)
Immature Granulocytes: 0 %
Lymphocytes Relative: 74 %
Lymphs Abs: 0.4 10*3/uL — ABNORMAL LOW (ref 0.7–4.0)
MCH: 29.8 pg (ref 26.0–34.0)
MCHC: 32.9 g/dL (ref 30.0–36.0)
MCV: 90.4 fL (ref 80.0–100.0)
Monocytes Absolute: 0 10*3/uL — ABNORMAL LOW (ref 0.1–1.0)
Monocytes Relative: 4 %
Neutro Abs: 0.1 10*3/uL — CL (ref 1.7–7.7)
Neutrophils Relative %: 20 %
Platelet Count: 12 10*3/uL — ABNORMAL LOW (ref 150–400)
RBC: 2.72 MIL/uL — ABNORMAL LOW (ref 4.22–5.81)
RDW: 17.4 % — ABNORMAL HIGH (ref 11.5–15.5)
WBC Count: 0.5 10*3/uL — CL (ref 4.0–10.5)
nRBC: 0 % (ref 0.0–0.2)

## 2021-09-01 LAB — CMP (CANCER CENTER ONLY)
ALT: 85 U/L — ABNORMAL HIGH (ref 0–44)
AST: 45 U/L — ABNORMAL HIGH (ref 15–41)
Albumin: 3.7 g/dL (ref 3.5–5.0)
Alkaline Phosphatase: 97 U/L (ref 38–126)
Anion gap: 7 (ref 5–15)
BUN: 23 mg/dL (ref 8–23)
CO2: 27 mmol/L (ref 22–32)
Calcium: 9.3 mg/dL (ref 8.9–10.3)
Chloride: 104 mmol/L (ref 98–111)
Creatinine: 0.9 mg/dL (ref 0.61–1.24)
GFR, Estimated: >60 mL/min (ref >60)
Glucose, Bld: 114 mg/dL — ABNORMAL HIGH (ref 70–99)
Potassium: 4.5 mmol/L (ref 3.5–5.1)
Sodium: 138 mmol/L (ref 135–145)
Total Bilirubin: 1.3 mg/dL — ABNORMAL HIGH (ref 0.3–1.2)
Total Protein: 7.8 g/dL (ref 6.5–8.1)

## 2021-09-01 LAB — LACTATE DEHYDROGENASE: LDH: 142 U/L (ref 98–192)

## 2021-09-01 NOTE — Progress Notes (Signed)
CRITICAL VALUE STICKER ? ?CRITICAL VALUE: WBC 0.5/ANC 0.1 ?Platelets 12,000 ?Patient denies bleeding, fever, or s/s infection. ? ?RECEIVER (on-site recipient of call):Olen Eaves, RN ? ?DATE & TIME NOTIFIED: 09/01/21 @ 1055 ? ?MESSENGER (representative from lab):Diane ? ?MD NOTIFIED: Dr. Betsy Coder ? ?TIME OF NOTIFICATION:1100 ? ?RESPONSE:  No need to transfuse. F/U on Thursday as scheduled. ?

## 2021-09-04 ENCOUNTER — Encounter: Payer: Self-pay | Admitting: *Deleted

## 2021-09-04 ENCOUNTER — Inpatient Hospital Stay: Payer: Medicare Other

## 2021-09-04 ENCOUNTER — Inpatient Hospital Stay (HOSPITAL_BASED_OUTPATIENT_CLINIC_OR_DEPARTMENT_OTHER): Payer: Medicare Other | Admitting: Oncology

## 2021-09-04 VITALS — BP 107/71 | HR 100 | Temp 98.1°F | Resp 18 | Ht 72.0 in | Wt 146.0 lb

## 2021-09-04 DIAGNOSIS — D696 Thrombocytopenia, unspecified: Secondary | ICD-10-CM

## 2021-09-04 DIAGNOSIS — C187 Malignant neoplasm of sigmoid colon: Secondary | ICD-10-CM | POA: Diagnosis not present

## 2021-09-04 DIAGNOSIS — C9200 Acute myeloblastic leukemia, not having achieved remission: Secondary | ICD-10-CM

## 2021-09-04 LAB — CBC WITH DIFFERENTIAL (CANCER CENTER ONLY)
Abs Immature Granulocytes: 0 10*3/uL (ref 0.00–0.07)
Basophils Absolute: 0 10*3/uL (ref 0.0–0.1)
Basophils Relative: 0 %
Eosinophils Absolute: 0 10*3/uL (ref 0.0–0.5)
Eosinophils Relative: 0 %
HCT: 26 % — ABNORMAL LOW (ref 39.0–52.0)
Hemoglobin: 8.5 g/dL — ABNORMAL LOW (ref 13.0–17.0)
Immature Granulocytes: 0 %
Lymphocytes Relative: 61 %
Lymphs Abs: 0.3 10*3/uL — ABNORMAL LOW (ref 0.7–4.0)
MCH: 30 pg (ref 26.0–34.0)
MCHC: 32.7 g/dL (ref 30.0–36.0)
MCV: 91.9 fL (ref 80.0–100.0)
Monocytes Absolute: 0 10*3/uL — ABNORMAL LOW (ref 0.1–1.0)
Monocytes Relative: 6 %
Neutro Abs: 0.2 10*3/uL — CL (ref 1.7–7.7)
Neutrophils Relative %: 33 %
Platelet Count: 10 10*3/uL — ABNORMAL LOW (ref 150–400)
RBC: 2.83 MIL/uL — ABNORMAL LOW (ref 4.22–5.81)
RDW: 17.4 % — ABNORMAL HIGH (ref 11.5–15.5)
WBC Count: 0.6 10*3/uL — CL (ref 4.0–10.5)
nRBC: 0 % (ref 0.0–0.2)

## 2021-09-04 LAB — SAMPLE TO BLOOD BANK

## 2021-09-04 MED ORDER — SODIUM CHLORIDE 0.9% IV SOLUTION
250.0000 mL | Freq: Once | INTRAVENOUS | Status: AC
  Administered 2021-09-04: 250 mL via INTRAVENOUS

## 2021-09-04 NOTE — Progress Notes (Signed)
?[place] ?OFFICE PROGRESS NOTE ? ? ?Diagnosis: Myelodysplasia ? ?INTERVAL HISTORY:  ? ?[name] returns as scheduled.  He feels well.  No bleeding or fever.  No new complaint.  He was last transfused with packed red blood cells on 08/22/2021. ? ?Objective: ? ?Vital signs in last 24 hours: ? ?Blood pressure 107/71, pulse 100, temperature 98.1 ?F (36.7 ?C), temperature source Oral, resp. rate 18, height 6' (1.829 m), weight 146 lb (66.2 kg), SpO2 100 %. ?  ? ?HEENT: No thrush or bleeding ?Resp: Lungs clear bilaterally ?Cardio: Regular rate and rhythm ?GI: No hepatosplenomegaly ?Vascular: No leg edema  ?Skin: Ecchymoses over the arms, healed areas of previous skin cancers at the left forearm and left wrist ?Lab Results: ? ?Lab Results  ?Component Value Date  ? WBC 0.6 (LL) 09/04/2021  ? HGB 8.5 (L) 09/04/2021  ? HCT 26.0 (L) 09/04/2021  ? MCV 91.9 09/04/2021  ? PLT 10 (L) 09/04/2021  ? NEUTROABS 0.2 (LL) 09/04/2021  ? ? ?CMP  ?Lab Results  ?Component Value Date  ? NA 138 09/01/2021  ? K 4.5 09/01/2021  ? CL 104 09/01/2021  ? CO2 27 09/01/2021  ? GLUCOSE 114 (H) 09/01/2021  ? BUN 23 09/01/2021  ? CREATININE 0.90 09/01/2021  ? CALCIUM 9.3 09/01/2021  ? PROT 7.8 09/01/2021  ? ALBUMIN 3.7 09/01/2021  ? AST 45 (H) 09/01/2021  ? ALT 85 (H) 09/01/2021  ? ALKPHOS 97 09/01/2021  ? BILITOT 1.3 (H) 09/01/2021  ? GFRNONAA >60 09/01/2021  ? GFRAA 79 01/29/2020  ? ? ?Lab Results  ?Component Value Date  ? CEA1 3.9 10/22/2020  ? ? ? ?Medications: I have reviewed the patient's current medications. ? ? ?Assessment/Plan: ?Sigmoid colon adenocarcinoma ?-Colonoscopy 10/21/2020- partially obstructing mass found in the sigmoid colon consistent with adenocarcinoma. ?-CEA on 10/22/2020 was 3.9 ?-CTs 10/22/2020-colonic mass with pelvic lymphadenopathy including right common iliac and external iliac nodes, small pulmonary nodules ?-10/24/2020-sigmoid colectomy, end colostomy, tumor stuck to pelvic sidewall with rind of tissue  remaining at completion of surgery ?-Pathology- moderately differentiated adenocarcinoma the sigmoid colon, tumor extends into pericolonic connective tissue, no lymphovascular perineural invasion, 0/14 lymph nodes, carcinoma involves an area with the specimen was disrupted at mesenteric margin; mismatch repair protein (IHC) normal; MSI stable ?-CTs 11/24/2020-interval resection of colonic mass, decreased size of previously noted right pelvic adenopathy, new noted at the right internal/external iliac bifurcation, unchanged pulmonary nodules ?-Guardant reveal 12/06/2020-ctDNA not detected ?-Cycle 1 adjuvant Xeloda anticipated 01/06/2021 ?-Cycle 2 adjuvant Xeloda 01/27/2021, Xeloda dose reduced secondary to thrombocytopenia (he took 1 pill a day for an unclear amount of time, less than 14 days) ?-Cycle 3 adjuvant Xeloda 02/17/2021 ?2.  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 ?3.  Thrombocytopenia-chronic ?4.  Atrial flutter ?5.  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  COPD ?7.  Hypertension ?8.  Diabetes mellitus ?9.  Dementia ?10.  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  ?  13.  Admission 06/05/2021 with presyncope ?14.  Myelodysplasia evolving into acute leukemia based on bone  marrow biopsy 06/10/2021 ?Cycle one 5-azacytidine daily x5 days beginning 06/16/2021,Ziextenzo 06/23/2021 ?Cycle two 5-azacytidine daily x5 days beginning 07/14/2021, Ziextenzo 07/21/2018 ?15.  Elevated LFTs 08/25/2021 ?  ? ? ?Disposition: ?[name] appears unchanged.  He has persistent severe pancytopenia.  He is scheduled for a restaging bone marrow biopsy on 09/08/2021.  He will receive a platelet transfusion today.  He will be scheduled for a lab visit on 09/11/2021.  He will return for an office visit on 09/15/2021.  We continue to provide Red cell and platelet transfusion support as needed.  He continues Levaquin prophylaxis. ? ?Betsy Coder, MD ? ?09/04/2021  ?1:16 PM ? ? ?

## 2021-09-04 NOTE — Patient Instructions (Signed)
Platelet Transfusion ?A platelet transfusion is a procedure in which a person receives donated platelets through an IV. Platelets are parts of blood that stick together and form a clot to help the body stop bleeding after an injury. If you have too few platelets, your blood may have trouble clotting. This may cause you to bleed and bruise very easily. ?You may need a platelet transfusion if you have a condition that causes a low number of platelets (thrombocytopenia). A platelet transfusion may be used to stop or prevent excessive bleeding. ?Tell a health care provider about: ?Any reactions you have had during previous transfusions. ?Any allergies you have. ?All medicines you are taking, including vitamins, herbs, eye drops, creams, and over-the-counter medicines. ?Any bleeding problems you have. ?Any surgeries you have had. ?Any medical conditions you have. ?Whether you are pregnant or may be pregnant. ?What are the risks? ?Generally, this is a safe procedure. However, problems may occur, including: ?Fever. ?Infection. ?Allergic reaction to the donated (donor) platelets. ?Your body's disease-fighting system (immune system) attacking the donor platelets (hemolytic reaction). This is rare. ?A rare reaction that causes lung damage (transfusion-related acute lung injury). ?What happens before the procedure? ?Medicines ?Ask your health care provider about: ?Changing or stopping your regular medicines. This is especially important if you are taking diabetes medicines or blood thinners. ?Taking medicines such as aspirin and ibuprofen. These medicines can thin your blood. Do not take these medicines unless your health care provider tells you to take them. ?Taking over-the-counter medicines, vitamins, herbs, and supplements. ?General instructions ?You will have a blood test to determine your blood type. Your blood type determines what kind of platelets you will be given. ?Follow instructions from your health care provider  about eating or drinking restrictions. ?If you have had an allergic reaction to a transfusion in the past, you may be given medicine to help prevent a reaction. ?Your temperature, blood pressure, pulse, and breathing will be monitored. ?What happens during the procedure? ? ?An IV will be inserted into one of your veins. ?For your safety, two health care providers will verify your identity along with the donor platelets about to be infused. ?A bag of donor platelets will be connected to your IV. The platelets will flow into your bloodstream. This usually takes 30-60 minutes. ?Your temperature, blood pressure, pulse, and breathing will be monitored during the transfusion. This helps detect early signs of any reaction. ?You will also be monitored for other symptoms that may indicate a reaction, including chills, hives, or itching. ?If you have signs of a reaction at any time, your transfusion will be stopped, and you may be given medicine to help manage the reaction. ?When your transfusion is complete, your IV will be removed. ?Pressure may be applied to the IV site for a few minutes to stop any bleeding. ?The IV site will be covered with a bandage (dressing). ?The procedure may vary among health care providers and hospitals. ?What can I expect after the procedure? ?Your blood pressure, temperature, pulse, and breathing will be monitored until you leave the hospital or clinic. ?You may have some bruising and soreness at your IV site. ?Follow these instructions at home: ?Medicines ?Take over-the-counter and prescription medicines only as told by your health care provider. ?Talk with your health care provider before you take any medicines that contain aspirin or NSAIDs, such as ibuprofen. These medicines increase your risk for dangerous bleeding. ?IV site care ?Check your IV site every day for signs of infection. Check for: ?  Redness, swelling, or pain. ?Fluid or blood. If fluid or blood drains from your IV site, use your  hands to press down firmly on a bandage covering the area for a minute or two. Doing this should stop the bleeding. ?Warmth. ?Pus or a bad smell. ?General instructions ?Change or remove your dressing as told by your health care provider. ?Return to your normal activities as told by your health care provider. Ask your health care provider what activities are safe for you. ?Do not take baths, swim, or use a hot tub until your health care provider approves. Ask your health care provider if you may take showers. ?Keep all follow-up visits. This is important. ?Contact a health care provider if: ?You have a headache that does not go away with medicine. ?You have hives, rash, or itchy skin. ?You have nausea or vomiting. ?You feel unusually tired or weak. ?You have signs of infection at your IV site. ?Get help right away if: ?You have a fever or chills. ?You urinate less often than usual. ?Your urine is darker colored than normal. ?You have any of the following: ?Trouble breathing. ?Pain in your back, abdomen, or chest. ?Cool, clammy skin. ?A fast heartbeat. ?Summary ?Platelets are tiny pieces of blood cells that clump together to form a blood clot when you have an injury. If you have too few platelets, your blood may have trouble clotting. ?A platelet transfusion is a procedure in which you receive donated platelets through an IV. ?A platelet transfusion may be used to stop or prevent excessive bleeding. ?After the procedure, check your IV site every day for signs of infection. ?This information is not intended to replace advice given to you by your health care provider. Make sure you discuss any questions you have with your health care provider. ?Document Revised: 11/21/2020 Document Reviewed: 11/21/2020 ?Elsevier Patient Education ? 2022 Elsevier Inc. ? ?

## 2021-09-04 NOTE — Progress Notes (Signed)
CRITICAL VALUE STICKER ? ?CRITICAL VALUE: WBC 06. And platelets 10,000 ? ?RECEIVER (on-site recipient of call):Felisia Balcom, RN ? ?DATE & TIME NOTIFIED: 09/04/21 @ 0847 ? ?MESSENGER (representative from lab):Matt ? ?MD NOTIFIED: Dr. Benay Spice ? ?TIME OF NOTIFICATION:0849 ? ?RESPONSE: Transfuse 1 unit platelets today ? ?

## 2021-09-05 ENCOUNTER — Other Ambulatory Visit: Payer: Self-pay | Admitting: Radiology

## 2021-09-05 LAB — BPAM PLATELET PHERESIS
Blood Product Expiration Date: 202304072359
ISSUE DATE / TIME: 202304060916
Unit Type and Rh: 5100

## 2021-09-05 LAB — PREPARE PLATELET PHERESIS: Unit division: 0

## 2021-09-08 ENCOUNTER — Ambulatory Visit (HOSPITAL_COMMUNITY): Payer: Medicare Other | Attending: Nurse Practitioner

## 2021-09-08 ENCOUNTER — Ambulatory Visit (HOSPITAL_COMMUNITY): Admission: RE | Admit: 2021-09-08 | Payer: Medicare Other | Source: Ambulatory Visit

## 2021-09-11 ENCOUNTER — Inpatient Hospital Stay: Payer: Medicare Other

## 2021-09-11 ENCOUNTER — Telehealth: Payer: Self-pay

## 2021-09-11 ENCOUNTER — Other Ambulatory Visit: Payer: Self-pay

## 2021-09-11 VITALS — BP 108/68 | HR 63 | Temp 98.2°F | Resp 18

## 2021-09-11 DIAGNOSIS — D696 Thrombocytopenia, unspecified: Secondary | ICD-10-CM

## 2021-09-11 DIAGNOSIS — C187 Malignant neoplasm of sigmoid colon: Secondary | ICD-10-CM | POA: Diagnosis not present

## 2021-09-11 DIAGNOSIS — C9200 Acute myeloblastic leukemia, not having achieved remission: Secondary | ICD-10-CM

## 2021-09-11 LAB — CBC WITH DIFFERENTIAL (CANCER CENTER ONLY)
Abs Immature Granulocytes: 0 10*3/uL (ref 0.00–0.07)
Basophils Absolute: 0 10*3/uL (ref 0.0–0.1)
Basophils Relative: 0 %
Eosinophils Absolute: 0 10*3/uL (ref 0.0–0.5)
Eosinophils Relative: 1 %
HCT: 21.1 % — ABNORMAL LOW (ref 39.0–52.0)
Hemoglobin: 6.9 g/dL — CL (ref 13.0–17.0)
Lymphocytes Relative: 78 %
Lymphs Abs: 0.3 10*3/uL — ABNORMAL LOW (ref 0.7–4.0)
MCH: 30 pg (ref 26.0–34.0)
MCHC: 32.7 g/dL (ref 30.0–36.0)
MCV: 91.7 fL (ref 80.0–100.0)
Monocytes Absolute: 0 10*3/uL — ABNORMAL LOW (ref 0.1–1.0)
Monocytes Relative: 2 %
Neutro Abs: 0.1 10*3/uL — CL (ref 1.7–7.7)
Neutrophils Relative %: 19 %
Platelet Count: 9 10*3/uL — CL (ref 150–400)
RBC: 2.3 MIL/uL — ABNORMAL LOW (ref 4.22–5.81)
RDW: 18.1 % — ABNORMAL HIGH (ref 11.5–15.5)
Smear Review: DECREASED
WBC Count: 0.4 10*3/uL — CL (ref 4.0–10.5)
WBC Morphology: ABNORMAL
nRBC: 0 % (ref 0.0–0.2)
nRBC: 1 /100 WBC — ABNORMAL HIGH

## 2021-09-11 LAB — SAMPLE TO BLOOD BANK

## 2021-09-11 LAB — PREPARE RBC (CROSSMATCH)

## 2021-09-11 MED ORDER — SODIUM CHLORIDE 0.9% IV SOLUTION
250.0000 mL | Freq: Once | INTRAVENOUS | Status: AC
  Administered 2021-09-11: 250 mL via INTRAVENOUS

## 2021-09-11 NOTE — Telephone Encounter (Signed)
CRITICAL VALUE STICKER ? ?CRITICAL VALUE: WBC 0.4, ANC 0.1, Plt, 9,000, HGB 6.9 ? ?RECEIVER (on-site recipient of call): Merceda Elks RN ? ?DATE & TIME NOTIFIED: 11:48  ? ?MESSENGER (representative from lab): Silva Bandy [facility] Lab 11:48 ? ?MD NOTIFIED: Dr. Benay Spice  ? ?TIME OF NOTIFICATION: 11:50 ? ?RESPONSE: Pt to scheduled to receive platelets today and 2 units of RBC tomorrow 09/12/21 ?

## 2021-09-11 NOTE — Patient Instructions (Signed)
Platelet Transfusion ?A platelet transfusion is a procedure in which a person receives donated platelets through an IV. Platelets are parts of blood that stick together and form a clot to help the body stop bleeding after an injury. If you have too few platelets, your blood may have trouble clotting. This may cause you to bleed and bruise very easily. ?You may need a platelet transfusion if you have a condition that causes a low number of platelets (thrombocytopenia). A platelet transfusion may be used to stop or prevent excessive bleeding. ?Tell a health care provider about: ?Any reactions you have had during previous transfusions. ?Any allergies you have. ?All medicines you are taking, including vitamins, herbs, eye drops, creams, and over-the-counter medicines. ?Any bleeding problems you have. ?Any surgeries you have had. ?Any medical conditions you have. ?Whether you are pregnant or may be pregnant. ?What are the risks? ?Generally, this is a safe procedure. However, problems may occur, including: ?Fever. ?Infection. ?Allergic reaction to the donated (donor) platelets. ?Your body's disease-fighting system (immune system) attacking the donor platelets (hemolytic reaction). This is rare. ?A rare reaction that causes lung damage (transfusion-related acute lung injury). ?What happens before the procedure? ?Medicines ?Ask your health care provider about: ?Changing or stopping your regular medicines. This is especially important if you are taking diabetes medicines or blood thinners. ?Taking medicines such as aspirin and ibuprofen. These medicines can thin your blood. Do not take these medicines unless your health care provider tells you to take them. ?Taking over-the-counter medicines, vitamins, herbs, and supplements. ?General instructions ?You will have a blood test to determine your blood type. Your blood type determines what kind of platelets you will be given. ?Follow instructions from your health care provider  about eating or drinking restrictions. ?If you have had an allergic reaction to a transfusion in the past, you may be given medicine to help prevent a reaction. ?Your temperature, blood pressure, pulse, and breathing will be monitored. ?What happens during the procedure? ? ?An IV will be inserted into one of your veins. ?For your safety, two health care providers will verify your identity along with the donor platelets about to be infused. ?A bag of donor platelets will be connected to your IV. The platelets will flow into your bloodstream. This usually takes 30-60 minutes. ?Your temperature, blood pressure, pulse, and breathing will be monitored during the transfusion. This helps detect early signs of any reaction. ?You will also be monitored for other symptoms that may indicate a reaction, including chills, hives, or itching. ?If you have signs of a reaction at any time, your transfusion will be stopped, and you may be given medicine to help manage the reaction. ?When your transfusion is complete, your IV will be removed. ?Pressure may be applied to the IV site for a few minutes to stop any bleeding. ?The IV site will be covered with a bandage (dressing). ?The procedure may vary among health care providers and hospitals. ?What can I expect after the procedure? ?Your blood pressure, temperature, pulse, and breathing will be monitored until you leave the hospital or clinic. ?You may have some bruising and soreness at your IV site. ?Follow these instructions at home: ?Medicines ?Take over-the-counter and prescription medicines only as told by your health care provider. ?Talk with your health care provider before you take any medicines that contain aspirin or NSAIDs, such as ibuprofen. These medicines increase your risk for dangerous bleeding. ?IV site care ?Check your IV site every day for signs of infection. Check for: ?  Redness, swelling, or pain. ?Fluid or blood. If fluid or blood drains from your IV site, use your  hands to press down firmly on a bandage covering the area for a minute or two. Doing this should stop the bleeding. ?Warmth. ?Pus or a bad smell. ?General instructions ?Change or remove your dressing as told by your health care provider. ?Return to your normal activities as told by your health care provider. Ask your health care provider what activities are safe for you. ?Do not take baths, swim, or use a hot tub until your health care provider approves. Ask your health care provider if you may take showers. ?Keep all follow-up visits. This is important. ?Contact a health care provider if: ?You have a headache that does not go away with medicine. ?You have hives, rash, or itchy skin. ?You have nausea or vomiting. ?You feel unusually tired or weak. ?You have signs of infection at your IV site. ?Get help right away if: ?You have a fever or chills. ?You urinate less often than usual. ?Your urine is darker colored than normal. ?You have any of the following: ?Trouble breathing. ?Pain in your back, abdomen, or chest. ?Cool, clammy skin. ?A fast heartbeat. ?Summary ?Platelets are tiny pieces of blood cells that clump together to form a blood clot when you have an injury. If you have too few platelets, your blood may have trouble clotting. ?A platelet transfusion is a procedure in which you receive donated platelets through an IV. ?A platelet transfusion may be used to stop or prevent excessive bleeding. ?After the procedure, check your IV site every day for signs of infection. ?This information is not intended to replace advice given to you by your health care provider. Make sure you discuss any questions you have with your health care provider. ?Document Revised: 11/21/2020 Document Reviewed: 11/21/2020 ?Elsevier Patient Education ? 2022 Elsevier Inc. ? ?

## 2021-09-12 ENCOUNTER — Inpatient Hospital Stay: Payer: Medicare Other

## 2021-09-12 DIAGNOSIS — C9200 Acute myeloblastic leukemia, not having achieved remission: Secondary | ICD-10-CM

## 2021-09-12 DIAGNOSIS — C187 Malignant neoplasm of sigmoid colon: Secondary | ICD-10-CM | POA: Diagnosis not present

## 2021-09-12 LAB — BPAM PLATELET PHERESIS
Blood Product Expiration Date: 202304162359
ISSUE DATE / TIME: 202304131223
Unit Type and Rh: 5100

## 2021-09-12 LAB — PREPARE PLATELET PHERESIS: Unit division: 0

## 2021-09-12 MED ORDER — SODIUM CHLORIDE 0.9% IV SOLUTION
250.0000 mL | Freq: Once | INTRAVENOUS | Status: AC
  Administered 2021-09-12: 250 mL via INTRAVENOUS

## 2021-09-12 NOTE — Patient Instructions (Signed)
Blood Transfusion, Adult, Care After ?This sheet gives you information about how to care for yourself after your procedure. Your health care provider may also give you more specific instructions. If you have problems or questions, contact your health care provider. ?What can I expect after the procedure? ?After the procedure, it is common to have: ?Bruising and soreness where the IV was inserted. ?A headache. ?Follow these instructions at home: ?IV insertion site care ? ?  ? ?Follow instructions from your health care provider about how to take care of your IV insertion site. Make sure you: ?Wash your hands with soap and water before and after you change your bandage (dressing). If soap and water are not available, use hand sanitizer. ?Change your dressing as told by your health care provider. ?Check your IV insertion site every day for signs of infection. Check for: ?Redness, swelling, or pain. ?Bleeding from the site. ?Warmth. ?Pus or a bad smell. ?General instructions ?Take over-the-counter and prescription medicines only as told by your health care provider. ?Rest as told by your health care provider. ?Return to your normal activities as told by your health care provider. ?Keep all follow-up visits as told by your health care provider. This is important. ?Contact a health care provider if: ?You have itching or red, swollen areas of skin (hives). ?You feel anxious. ?You feel weak after doing your normal activities. ?You have redness, swelling, warmth, or pain around the IV insertion site. ?You have blood coming from the IV insertion site that does not stop with pressure. ?You have pus or a bad smell coming from your IV insertion site. ?Get help right away if: ?You have symptoms of a serious allergic or immune system reaction, including: ?Trouble breathing or shortness of breath. ?Swelling of the face or feeling flushed. ?Fever or chills. ?Pain in the head, back, or chest. ?Dark urine or blood in the  urine. ?Widespread rash. ?Fast heartbeat. ?Feeling dizzy or light-headed. ?If you receive your blood transfusion in an outpatient setting, you will be told whom to contact to report any reactions. ?These symptoms may represent a serious problem that is an emergency. Do not wait to see if the symptoms will go away. Get medical help right away. Call your local emergency services (911 in the [place] Do not drive yourself to the hospital. ?Summary ?Bruising and tenderness around the IV insertion site are common. ?Check your IV insertion site every day for signs of infection. ?Rest as told by your health care provider. Return to your normal activities as told by your health care provider. ?Get help right away for symptoms of a serious allergic or immune system reaction to blood transfusion. ?This information is not intended to replace advice given to you by your health care provider. Make sure you discuss any questions you have with your health care provider. ?Document Revised: 09/12/2020 Document Reviewed: 11/10/2018 ?Elsevier Patient Education ? 2023 Elsevier Inc[employer] ? ?

## 2021-09-14 LAB — TYPE AND SCREEN
ABO/RH(D): AB POS
Antibody Screen: NEGATIVE
Unit division: 0
Unit division: 0

## 2021-09-14 LAB — BPAM RBC
Blood Product Expiration Date: 202305122359
Blood Product Expiration Date: 202305122359
ISSUE DATE / TIME: 202304140737
ISSUE DATE / TIME: 202304140737
Unit Type and Rh: 5100
Unit Type and Rh: 5100

## 2021-09-15 ENCOUNTER — Encounter: Payer: Self-pay | Admitting: Nurse Practitioner

## 2021-09-15 ENCOUNTER — Inpatient Hospital Stay (HOSPITAL_BASED_OUTPATIENT_CLINIC_OR_DEPARTMENT_OTHER): Payer: Medicare Other | Admitting: Nurse Practitioner

## 2021-09-15 ENCOUNTER — Inpatient Hospital Stay: Payer: Medicare Other

## 2021-09-15 ENCOUNTER — Other Ambulatory Visit: Payer: Self-pay | Admitting: *Deleted

## 2021-09-15 VITALS — BP 114/78 | HR 100 | Temp 97.8°F | Resp 18 | Ht 72.0 in | Wt 144.6 lb

## 2021-09-15 DIAGNOSIS — C9200 Acute myeloblastic leukemia, not having achieved remission: Secondary | ICD-10-CM | POA: Diagnosis not present

## 2021-09-15 DIAGNOSIS — D696 Thrombocytopenia, unspecified: Secondary | ICD-10-CM

## 2021-09-15 DIAGNOSIS — C187 Malignant neoplasm of sigmoid colon: Secondary | ICD-10-CM | POA: Diagnosis not present

## 2021-09-15 LAB — CBC WITH DIFFERENTIAL (CANCER CENTER ONLY)
Abs Immature Granulocytes: 0 10*3/uL (ref 0.00–0.07)
Basophils Absolute: 0 10*3/uL (ref 0.0–0.1)
Basophils Relative: 0 %
Eosinophils Absolute: 0 10*3/uL (ref 0.0–0.5)
Eosinophils Relative: 0 %
HCT: 25 % — ABNORMAL LOW (ref 39.0–52.0)
Hemoglobin: 8.2 g/dL — ABNORMAL LOW (ref 13.0–17.0)
Lymphocytes Relative: 79 %
Lymphs Abs: 0.5 10*3/uL — ABNORMAL LOW (ref 0.7–4.0)
MCH: 29.5 pg (ref 26.0–34.0)
MCHC: 32.8 g/dL (ref 30.0–36.0)
MCV: 89.9 fL (ref 80.0–100.0)
Monocytes Absolute: 0 10*3/uL — ABNORMAL LOW (ref 0.1–1.0)
Monocytes Relative: 2 %
Neutro Abs: 0.1 10*3/uL — CL (ref 1.7–7.7)
Neutrophils Relative %: 19 %
Platelet Count: 7 10*3/uL — CL (ref 150–400)
RBC: 2.78 MIL/uL — ABNORMAL LOW (ref 4.22–5.81)
RDW: 17 % — ABNORMAL HIGH (ref 11.5–15.5)
Smear Review: DECREASED
WBC Count: 0.6 10*3/uL — CL (ref 4.0–10.5)
WBC Morphology: ABNORMAL
nRBC: 0 % (ref 0.0–0.2)
nRBC: 2 /100 WBC — ABNORMAL HIGH

## 2021-09-15 LAB — SAMPLE TO BLOOD BANK

## 2021-09-15 MED ORDER — SODIUM CHLORIDE 0.9% IV SOLUTION
250.0000 mL | Freq: Once | INTRAVENOUS | Status: AC
  Administered 2021-09-15: 250 mL via INTRAVENOUS

## 2021-09-15 NOTE — Progress Notes (Signed)
CRITICAL VALUE STICKER ? ?CRITICAL VALUE: WBC 0.6, ANC 0.1 and platelet 7,000 ? ?RECEIVER (on-site recipient of call):Pamlea Finder,RN ? ?DATE & TIME NOTIFIED: 09/15/21 @ 0900 ? ?MESSENGER (representative from lab): Silva Bandy ? ?MD NOTIFIED: Ned Card, NP ? ?TIME OF NOTIFICATION:0901 ? ?RESPONSE:  Transfuse platelets today. Awaiting him to have BMBX ?

## 2021-09-15 NOTE — Patient Instructions (Signed)
Platelet Transfusion A platelet transfusion is a procedure in which a person receives donated platelets through an IV. Platelets are parts of blood that stick together and form a clot to help the body stop bleeding after an injury. If you have too few platelets, your blood may have trouble clotting. This may cause you to bleed and bruise very easily. You may need a platelet transfusion if you have a condition that causes a low number of platelets (thrombocytopenia). A platelet transfusion may be used to stop or prevent excessive bleeding. Tell a health care provider about: Any reactions you have had during previous transfusions. Any allergies you have. All medicines you are taking, including vitamins, herbs, eye drops, creams, and over-the-counter medicines. Any bleeding problems you have. Any surgeries you have had. Any medical conditions you have. Whether you are pregnant or may be pregnant. What are the risks? Generally, this is a safe procedure. However, problems may occur, including: Fever. Infection. Allergic reaction to the donated (donor) platelets. Your body's disease-fighting system (immune system) attacking the donor platelets (hemolytic reaction). This is rare. A rare reaction that causes lung damage (transfusion-related acute lung injury). What happens before the procedure? Medicines Ask your health care provider about: Changing or stopping your regular medicines. This is especially important if you are taking diabetes medicines or blood thinners. Taking medicines such as aspirin and ibuprofen. These medicines can thin your blood. Do not take these medicines unless your health care provider tells you to take them. Taking over-the-counter medicines, vitamins, herbs, and supplements. General instructions You will have a blood test to determine your blood type. Your blood type determines what kind of platelets you will be given. Follow instructions from your health care provider  about eating or drinking restrictions. If you have had an allergic reaction to a transfusion in the past, you may be given medicine to help prevent a reaction. Your temperature, blood pressure, pulse, and breathing will be monitored. What happens during the procedure?  An IV will be inserted into one of your veins. For your safety, two health care providers will verify your identity along with the donor platelets about to be infused. A bag of donor platelets will be connected to your IV. The platelets will flow into your bloodstream. This usually takes 30-60 minutes. Your temperature, blood pressure, pulse, and breathing will be monitored during the transfusion. This helps detect early signs of any reaction. You will also be monitored for other symptoms that may indicate a reaction, including chills, hives, or itching. If you have signs of a reaction at any time, your transfusion will be stopped, and you may be given medicine to help manage the reaction. When your transfusion is complete, your IV will be removed. Pressure may be applied to the IV site for a few minutes to stop any bleeding. The IV site will be covered with a bandage (dressing). The procedure may vary among health care providers and hospitals. What can I expect after the procedure? Your blood pressure, temperature, pulse, and breathing will be monitored until you leave the hospital or clinic. You may have some bruising and soreness at your IV site. Follow these instructions at home: Medicines Take over-the-counter and prescription medicines only as told by your health care provider. Talk with your health care provider before you take any medicines that contain aspirin or NSAIDs, such as ibuprofen. These medicines increase your risk for dangerous bleeding. IV site care Check your IV site every day for signs of infection. Check for:   Redness, swelling, or pain. Fluid or blood. If fluid or blood drains from your IV site, use your  hands to press down firmly on a bandage covering the area for a minute or two. Doing this should stop the bleeding. Warmth. Pus or a bad smell. General instructions Change or remove your dressing as told by your health care provider. Return to your normal activities as told by your health care provider. Ask your health care provider what activities are safe for you. Do not take baths, swim, or use a hot tub until your health care provider approves. Ask your health care provider if you may take showers. Keep all follow-up visits. This is important. Contact a health care provider if: You have a headache that does not go away with medicine. You have hives, rash, or itchy skin. You have nausea or vomiting. You feel unusually tired or weak. You have signs of infection at your IV site. Get help right away if: You have a fever or chills. You urinate less often than usual. Your urine is darker colored than normal. You have any of the following: Trouble breathing. Pain in your back, abdomen, or chest. Cool, clammy skin. A fast heartbeat. Summary Platelets are tiny pieces of blood cells that clump together to form a blood clot when you have an injury. If you have too few platelets, your blood may have trouble clotting. A platelet transfusion is a procedure in which you receive donated platelets through an IV. A platelet transfusion may be used to stop or prevent excessive bleeding. After the procedure, check your IV site every day for signs of infection. This information is not intended to replace advice given to you by your health care provider. Make sure you discuss any questions you have with your health care provider. Document Revised: 11/21/2020 Document Reviewed: 11/21/2020 Elsevier Patient Education  2023 Elsevier Inc.  

## 2021-09-15 NOTE — Progress Notes (Signed)
Platelet orders are in. Blood bank notified. ?

## 2021-09-15 NOTE — Progress Notes (Signed)
?[facility] ?OFFICE PROGRESS NOTE ? ? ?Diagnosis:  Myelodysplasia ? ?INTERVAL HISTORY:  ? ?Mr. Mier returns as scheduled.  He was transfused platelets on 09/11/2021 and 2 units of blood on 09/12/2021.  He denies bleeding.  No fever.  He reports a good appetite.   ? ?Objective: ? ?Vital signs in last 24 hours: ? ?Blood pressure 114/78, pulse 100, temperature 97.8 ?F (36.6 ?C), temperature source Oral, resp. rate 18, height 6' (1.829 m), weight 144 lb 9.6 oz (65.6 kg), SpO2 100 %. ?  ? ?HEENT: No thrush or ulcers.  No bleeding within the oral cavity. ?Resp: Scattered rhonchi.  No respiratory distress.  Rhonchi improved with coughing. ?Cardio: Regular rate and rhythm. ?GI: No hepatosplenomegaly.  Left lower quadrant colostomy. ?Vascular: No leg edema. ?Skin: Ecchymoses scattered over the forearms and lower legs.  Healed areas of previous skin cancers at the left forearm and left wrist. ? ? ?Lab Results: ? ?Lab Results  ?Component Value Date  ? WBC 0.4 (LL) 09/11/2021  ? HGB 6.9 (LL) 09/11/2021  ? HCT 21.1 (L) 09/11/2021  ? MCV 91.7 09/11/2021  ? PLT 9 (LL) 09/11/2021  ? NEUTROABS 0.1 (LL) 09/11/2021  ? ? ?Imaging: ? ?No results found. ? ?Medications: I have reviewed the patient's current medications. ? ?Assessment/Plan: ?Sigmoid colon adenocarcinoma ?-Colonoscopy 10/21/2020- partially obstructing mass found in the sigmoid colon consistent with adenocarcinoma. ?-CEA on 10/22/2020 was 3.9 ?-CTs 10/22/2020-colonic mass with pelvic lymphadenopathy including right common iliac and external iliac nodes, small pulmonary nodules ?-10/24/2020-sigmoid colectomy, end colostomy, tumor stuck to pelvic sidewall with rind of tissue remaining at completion of surgery ?-Pathology- moderately differentiated adenocarcinoma the sigmoid colon, tumor extends into pericolonic connective tissue, no lymphovascular perineural invasion, 0/14 lymph nodes, carcinoma involves an area with the specimen was disrupted at mesenteric margin;  mismatch repair protein (IHC) normal; MSI stable ?-CTs 11/24/2020-interval resection of colonic mass, decreased size of previously noted right pelvic adenopathy, new noted at the right internal/external iliac bifurcation, unchanged pulmonary nodules ?-Guardant reveal 12/06/2020-ctDNA not detected ?-Cycle 1 adjuvant Xeloda anticipated 01/06/2021 ?-Cycle 2 adjuvant Xeloda 01/27/2021, Xeloda dose reduced secondary to thrombocytopenia (he took 1 pill a day for an unclear amount of time, less than 14 days) ?-Cycle 3 adjuvant Xeloda 02/17/2021 ?2.  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 ?3.  Thrombocytopenia-chronic ?4.  Atrial flutter ?5.  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  COPD ?7.  Hypertension ?8.  Diabetes mellitus ?9.  Dementia ?10.  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  ?13.  Admission 06/05/2021 with presyncope ?14.  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  Elevated LFTs 08/25/2021 ? ?Disposition: Mr. Binion appears unchanged.  He has persistent severe pancytopenia.   He missed his appointment for the bone marrow biopsy last week.  It  has been rescheduled to 09/23/2021.  We will continue to provide red cell and platelet transfusion support as needed.  He continues Levaquin prophylaxis.  He understands to contact the office with fever, bleeding.  He will return for labs on 09/18/2021 and 09/22/2021.  We will see him in follow-up on 09/25/2021. ? ? ? ?Ned Card ANP/GNP-BC  ? ?09/15/2021  ?8:19 AM ? ? ? ? ? ? ? ?

## 2021-09-16 LAB — PREPARE PLATELET PHERESIS: Unit division: 0

## 2021-09-16 LAB — BPAM PLATELET PHERESIS
Blood Product Expiration Date: 202304172359
ISSUE DATE / TIME: 202304170933
Unit Type and Rh: 6200

## 2021-09-18 ENCOUNTER — Telehealth: Payer: Self-pay | Admitting: *Deleted

## 2021-09-18 ENCOUNTER — Inpatient Hospital Stay: Payer: Medicare Other

## 2021-09-18 ENCOUNTER — Other Ambulatory Visit: Payer: Self-pay | Admitting: *Deleted

## 2021-09-18 DIAGNOSIS — C9200 Acute myeloblastic leukemia, not having achieved remission: Secondary | ICD-10-CM

## 2021-09-18 DIAGNOSIS — D696 Thrombocytopenia, unspecified: Secondary | ICD-10-CM

## 2021-09-18 DIAGNOSIS — C187 Malignant neoplasm of sigmoid colon: Secondary | ICD-10-CM | POA: Diagnosis not present

## 2021-09-18 LAB — CBC WITH DIFFERENTIAL (CANCER CENTER ONLY)
Abs Immature Granulocytes: 0 10*3/uL (ref 0.00–0.07)
Basophils Absolute: 0 10*3/uL (ref 0.0–0.1)
Basophils Relative: 1 %
Eosinophils Absolute: 0 10*3/uL (ref 0.0–0.5)
Eosinophils Relative: 0 %
HCT: 24.8 % — ABNORMAL LOW (ref 39.0–52.0)
Hemoglobin: 8.1 g/dL — ABNORMAL LOW (ref 13.0–17.0)
Lymphocytes Relative: 80 %
Lymphs Abs: 0.4 10*3/uL — ABNORMAL LOW (ref 0.7–4.0)
MCH: 29.5 pg (ref 26.0–34.0)
MCHC: 32.7 g/dL (ref 30.0–36.0)
MCV: 90.2 fL (ref 80.0–100.0)
Monocytes Absolute: 0 10*3/uL — ABNORMAL LOW (ref 0.1–1.0)
Monocytes Relative: 1 %
Neutro Abs: 0.1 10*3/uL — CL (ref 1.7–7.7)
Neutrophils Relative %: 18 %
Platelet Count: 9 10*3/uL — CL (ref 150–400)
RBC: 2.75 MIL/uL — ABNORMAL LOW (ref 4.22–5.81)
RDW: 16.6 % — ABNORMAL HIGH (ref 11.5–15.5)
WBC Count: 0.5 10*3/uL — CL (ref 4.0–10.5)
nRBC: 0 % (ref 0.0–0.2)
nRBC: 2 /100 WBC — ABNORMAL HIGH

## 2021-09-18 LAB — SAMPLE TO BLOOD BANK

## 2021-09-18 MED ORDER — SODIUM CHLORIDE 0.9% IV SOLUTION
250.0000 mL | Freq: Once | INTRAVENOUS | Status: AC
  Administered 2021-09-18: 250 mL via INTRAVENOUS

## 2021-09-18 NOTE — Telephone Encounter (Signed)
CRITICAL VALUE STICKER ? ?CRITICAL VALUE: ?WBC 0.5, ANC 0.1 ?Platelet 9,000 ? ?RECEIVER (on-site recipient of call):Sharyah Bostwick,RN ? ?DATE & TIME NOTIFIED: 09/18/21 @ 1021 ? ?MESSENGER (representative from lab):Phyllis ? ?MD NOTIFIED: Dr. Benay Spice ? ?TIME OF NOTIFICATION:1023 ? ?RESPONSE:  Transfuse 1 unit platelets today ?

## 2021-09-18 NOTE — Patient Instructions (Signed)
Essential Thrombocythemia  Essential thrombocythemia is a condition in which a person has too many platelets (thrombocytes) in the blood. Platelets are parts of blood that stick together and form a clot (thrombus) to help the body stop bleeding after an injury. This condition may also be called primary or essential thrombocytosis. Essential thrombocythemia happens when abnormal cells in the bone marrow (megakaryocytes) make too many platelets. What are the causes? The cause of this condition is not known. What are the signs or symptoms? This condition may not cause any symptoms. If you have symptoms, they may include: Blood clots. Weakness. Headache. Itching. Sweating or fever. Dizziness or confusion. Tingling or burning in your hands or feet. Symptoms may also include bleeding and an enlarged spleen. How is this diagnosed? This condition may be diagnosed based on: A physical exam. Your symptoms. Your medical history. Blood tests. A procedure to collect a sample of your bone marrow (bone marrow aspiration) for testing. How is this treated? If you do not have symptoms, you may not need treatment. Your health care provider may monitor your condition with regular blood tests. If you have symptoms, or if your platelet count is very high, you may be treated with: Aspirin or other medicines to thin your blood and help prevent blood clots. Medicines to reduce the number of platelets in your blood. A procedure to remove some platelets from your blood (plateletpheresis). During this procedure: Your health care provider will place an IV into one of your veins. The IV will be used to draw your blood into a machine that separates out the extra platelets. The blood with reduced platelets will be returned to your body. Follow these instructions at home: Medicines Take over-the-counter and prescription medicines only as told by your health care provider. If you are taking blood thinners: Talk  with your health care provider before you take any medicines that contain aspirin or NSAIDs, such as ibuprofen. These medicines increase your risk for dangerous bleeding. Take your medicine exactly as told, at the same time every day. Avoid activities that could cause injury or bruising, and follow instructions about how to prevent falls. Wear a medical alert bracelet or carry a card that lists what medicines you take. Tell all health care providers, including dental health care providers, about any medicines you are taking to prevent blood clots. General instructions Do not use any products that contain nicotine or tobacco. These products include cigarettes, chewing tobacco, and vaping devices, such as e-cigarettes. If you need help quitting, ask your health care provider. Ask your health care provider about managing or preventing high cholesterol, high blood pressure, and diabetes. These conditions can make essential thrombocythemia worse. Keep all follow-up visits. This is important. Contact a health care provider if: You have severe pain, and medicines do not help. You have problems taking your medicines to prevent blood clots. You have new pain, warmth, swelling, or redness in an arm or leg. This could mean you have a blood clot. You faint. You have unusual bruises. You have bloody or tarry stools. You have pink or bloody urine. Your menstrual periods are heavier than normal, if applicable. You have nosebleeds, bleeding gums, or other bleeding. Get help right away if:  You have chest pain. You have trouble breathing. You have any symptoms of a stroke. "BE FAST" is an easy way to remember the main warning signs of a stroke: B - Balance. Signs are dizziness, sudden trouble walking, or loss of balance. E - Eyes. Signs are   trouble seeing or a sudden change in vision. F - Face. Signs are sudden weakness or numbness of the face, or the face or eyelid drooping on one side. A - Arm. Signs are  weakness or numbness in an arm. This happens suddenly and usually on one side of the body. S - Speech. Signs are sudden trouble speaking, slurred speech, or trouble understanding what people say. T - Time. Time to call emergency services. Write down what time symptoms started. You have other signs of a stroke, such as: A sudden, severe headache with no known cause. Nausea or vomiting. Seizure. These symptoms may represent a serious problem that is an emergency. Do not wait to see if the symptoms will go away. Get medical help right away. Call your local emergency services (911 in the [place] Do not drive yourself to the hospital. Summary Essential thrombocythemia happens when abnormal cells in the bone marrow make too many platelets. If you have symptoms, or if your platelet count is very high, you may need treatment. Treatment can vary and may include medicines to thin the blood and prevent blood clots. Ask your health care provider about how to manage or prevent high cholesterol, high blood pressure, and diabetes. All of these can make essential thrombocythemia worse. Get help right away if you have any symptoms of stroke. This information is not intended to replace advice given to you by your health care provider. Make sure you discuss any questions you have with your health care provider. Document Revised: 11/18/2020 Document Reviewed: 11/18/2020 Elsevier Patient Education  2023 Elsevier Inc.  

## 2021-09-19 LAB — PREPARE PLATELET PHERESIS: Unit division: 0

## 2021-09-19 LAB — BPAM PLATELET PHERESIS
Blood Product Expiration Date: 202304222359
ISSUE DATE / TIME: 202304201130
Unit Type and Rh: 6200

## 2021-09-20 ENCOUNTER — Other Ambulatory Visit: Payer: Self-pay | Admitting: Oncology

## 2021-09-22 ENCOUNTER — Ambulatory Visit (HOSPITAL_COMMUNITY): Payer: Medicare Other

## 2021-09-22 ENCOUNTER — Inpatient Hospital Stay: Payer: Medicare Other

## 2021-09-22 ENCOUNTER — Other Ambulatory Visit: Payer: Self-pay | Admitting: *Deleted

## 2021-09-22 ENCOUNTER — Other Ambulatory Visit: Payer: Self-pay | Admitting: Internal Medicine

## 2021-09-22 DIAGNOSIS — C9200 Acute myeloblastic leukemia, not having achieved remission: Secondary | ICD-10-CM

## 2021-09-22 DIAGNOSIS — C187 Malignant neoplasm of sigmoid colon: Secondary | ICD-10-CM | POA: Diagnosis not present

## 2021-09-22 LAB — CBC WITH DIFFERENTIAL (CANCER CENTER ONLY)
Abs Immature Granulocytes: 0 10*3/uL (ref 0.00–0.07)
Basophils Absolute: 0 10*3/uL (ref 0.0–0.1)
Basophils Relative: 0 %
Eosinophils Absolute: 0 10*3/uL (ref 0.0–0.5)
Eosinophils Relative: 0 %
HCT: 22.7 % — ABNORMAL LOW (ref 39.0–52.0)
Hemoglobin: 7.5 g/dL — ABNORMAL LOW (ref 13.0–17.0)
Immature Granulocytes: 0 %
Lymphocytes Relative: 83 %
Lymphs Abs: 0.4 10*3/uL — ABNORMAL LOW (ref 0.7–4.0)
MCH: 29.9 pg (ref 26.0–34.0)
MCHC: 33 g/dL (ref 30.0–36.0)
MCV: 90.4 fL (ref 80.0–100.0)
Monocytes Absolute: 0 10*3/uL — ABNORMAL LOW (ref 0.1–1.0)
Monocytes Relative: 4 %
Neutro Abs: 0.1 10*3/uL — CL (ref 1.7–7.7)
Neutrophils Relative %: 13 %
Platelet Count: 11 10*3/uL — ABNORMAL LOW (ref 150–400)
RBC: 2.51 MIL/uL — ABNORMAL LOW (ref 4.22–5.81)
RDW: 16.5 % — ABNORMAL HIGH (ref 11.5–15.5)
WBC Count: 0.5 10*3/uL — CL (ref 4.0–10.5)
nRBC: 0 % (ref 0.0–0.2)

## 2021-09-22 LAB — CMP (CANCER CENTER ONLY)
ALT: 102 U/L — ABNORMAL HIGH (ref 0–44)
AST: 52 U/L — ABNORMAL HIGH (ref 15–41)
Albumin: 3.6 g/dL (ref 3.5–5.0)
Alkaline Phosphatase: 117 U/L (ref 38–126)
Anion gap: 6 (ref 5–15)
BUN: 27 mg/dL — ABNORMAL HIGH (ref 8–23)
CO2: 26 mmol/L (ref 22–32)
Calcium: 9.8 mg/dL (ref 8.9–10.3)
Chloride: 106 mmol/L (ref 98–111)
Creatinine: 1.04 mg/dL (ref 0.61–1.24)
GFR, Estimated: >60 mL/min (ref >60)
Glucose, Bld: 90 mg/dL (ref 70–99)
Potassium: 4.4 mmol/L (ref 3.5–5.1)
Sodium: 138 mmol/L (ref 135–145)
Total Bilirubin: 1.2 mg/dL (ref 0.3–1.2)
Total Protein: 8 g/dL (ref 6.5–8.1)

## 2021-09-22 LAB — SAMPLE TO BLOOD BANK

## 2021-09-22 LAB — PREPARE RBC (CROSSMATCH)

## 2021-09-22 LAB — LACTATE DEHYDROGENASE: LDH: 177 U/L (ref 98–192)

## 2021-09-22 NOTE — Progress Notes (Signed)
Hgb 7.5 today. Per Dr. Benay Spice, will transfuse 2 units on 09/25/25 early am. Orders placed. ?CRITICAL VALUE STICKER ? ?CRITICAL VALUE: WBC 0.48 ? ?MD NOTIFIED: Dr. Benay Spice ? ?TIME OF NOTIFICATION: 2479 ? ?RESPONSE: Proceed w/bone marrow biopsy on 4/25 and f/u 4/27 as scheduled. ? ?

## 2021-09-23 ENCOUNTER — Ambulatory Visit (HOSPITAL_COMMUNITY)
Admission: RE | Admit: 2021-09-23 | Discharge: 2021-09-23 | Disposition: A | Discharge status: Home / Self Care | Payer: Medicare Other | Source: Ambulatory Visit | Attending: Nurse Practitioner | Admitting: Nurse Practitioner

## 2021-09-23 ENCOUNTER — Encounter (HOSPITAL_COMMUNITY): Payer: Self-pay

## 2021-09-23 ENCOUNTER — Other Ambulatory Visit: Payer: Self-pay

## 2021-09-23 DIAGNOSIS — Z85038 Personal history of other malignant neoplasm of large intestine: Secondary | ICD-10-CM | POA: Diagnosis not present

## 2021-09-23 DIAGNOSIS — C9200 Acute myeloblastic leukemia, not having achieved remission: Secondary | ICD-10-CM | POA: Insufficient documentation

## 2021-09-23 DIAGNOSIS — Z952 Presence of prosthetic heart valve: Secondary | ICD-10-CM | POA: Insufficient documentation

## 2021-09-23 DIAGNOSIS — I10 Essential (primary) hypertension: Secondary | ICD-10-CM | POA: Diagnosis not present

## 2021-09-23 DIAGNOSIS — I4892 Unspecified atrial flutter: Secondary | ICD-10-CM | POA: Insufficient documentation

## 2021-09-23 DIAGNOSIS — D61818 Other pancytopenia: Secondary | ICD-10-CM | POA: Diagnosis present

## 2021-09-23 DIAGNOSIS — E119 Type 2 diabetes mellitus without complications: Secondary | ICD-10-CM | POA: Insufficient documentation

## 2021-09-23 DIAGNOSIS — Z85828 Personal history of other malignant neoplasm of skin: Secondary | ICD-10-CM | POA: Diagnosis not present

## 2021-09-23 DIAGNOSIS — F03A0 Unspecified dementia, mild, without behavioral disturbance, psychotic disturbance, mood disturbance, and anxiety: Secondary | ICD-10-CM | POA: Diagnosis not present

## 2021-09-23 DIAGNOSIS — J449 Chronic obstructive pulmonary disease, unspecified: Secondary | ICD-10-CM | POA: Diagnosis not present

## 2021-09-23 MED ORDER — SODIUM CHLORIDE 0.9 % IV SOLN: INTRAVENOUS | Status: DC

## 2021-09-23 MED ORDER — FENTANYL CITRATE (PF) 100 MCG/2ML IJ SOLN
INTRAMUSCULAR | Status: AC | PRN
  Administered 2021-09-23 (×2): 25 ug via INTRAVENOUS

## 2021-09-23 MED ORDER — FENTANYL CITRATE (PF) 100 MCG/2ML IJ SOLN
INTRAMUSCULAR | Status: AC
  Filled 2021-09-23: qty 4

## 2021-09-23 NOTE — Progress Notes (Signed)
Date and time results received: 09/23/21 0905 ? ?(use smartphrase ".now" to insert current time) ? ?Test: CBC ?Critical Value: WBC 0.4   Platelet 11   Neutraphil Absolute 0.1 ? ?Name of Provider Notified: Dr. Anselm Pancoast ? ?Orders Received? Or Actions Taken?: Notified Dr. Anselm Pancoast.  No orders given ?

## 2021-09-23 NOTE — Discharge Instructions (Signed)
Please call Interventional Radiology clinic [phone] with any questions or concerns.  You may remove your dressing and shower tomorrow.   Bone Marrow Aspiration and Bone Marrow Biopsy, Adult, Care After This sheet gives you information about how to care for yourself after your procedure. Your health care provider may also give you more specific instructions. If you have problems or questions, contact your health care provider. What can I expect after the procedure? After the procedure, it is common to have: Mild pain and tenderness. Swelling. Bruising. Follow these instructions at home: Puncture site care Follow instructions from your health care provider about how to take care of the puncture site. Make sure you: Wash your hands with soap and water before and after you change your bandage (dressing). If soap and water are not available, use hand sanitizer. Change your dressing as told by your health care provider. Check your puncture site every day for signs of infection. Check for: More redness, swelling, or pain. Fluid or blood. Warmth. Pus or a bad smell.   Activity Return to your normal activities as told by your health care provider. Ask your health care provider what activities are safe for you. Do not lift anything that is heavier than 10 lb (4.5 kg), or the limit that you are told, until your health care provider says that it is safe. Do not drive for 24 hours if you were given a sedative during your procedure. General instructions Take over-the-counter and prescription medicines only as told by your health care provider. Do not take baths, swim, or use a hot tub until your health care provider approves. Ask your health care provider if you may take showers. You may only be allowed to take sponge baths. If directed, put ice on the affected area. To do this: Put ice in a plastic bag. Place a towel between your skin and the bag. Leave the ice on for 20 minutes, 2-3 times a  day. Keep all follow-up visits as told by your health care provider. This is important.   Contact a health care provider if: Your pain is not controlled with medicine. You have a fever. You have more redness, swelling, or pain around the puncture site. You have fluid or blood coming from the puncture site. Your puncture site feels warm to the touch. You have pus or a bad smell coming from the puncture site. Summary After the procedure, it is common to have mild pain, tenderness, swelling, and bruising. Follow instructions from your health care provider about how to take care of the puncture site and what activities are safe for you. Take over-the-counter and prescription medicines only as told by your health care provider. Contact a health care provider if you have any signs of infection, such as fluid or blood coming from the puncture site. This information is not intended to replace advice given to you by your health care provider. Make sure you discuss any questions you have with your health care provider. Document Revised: 10/04/2018 Document Reviewed: 10/04/2018 Elsevier Patient Education  2021 Elsevier Inc  .   

## 2021-09-23 NOTE — H&P (Addendum)
? ? ?Referring Physician(s): ?Sherrill,B ? ?Supervising Physician: Markus Daft ? ?Patient Status:  [facility] ? ?Chief Complaint: ? ?"I'm having another bone marrow biopsy" ? ?Subjective: ?Patient known to IR service from prior bone marrow biopsy on 06/10/2021.  He has a history of persistent severe pancytopenia with evolving acute leukemia.  He has undergone 2 cycles of chemotherapy.  He presents again today for follow-up CT-guided bone marrow biopsy for further evaluation.  He currently denies fever, headache, chest pain, dyspnea, abdominal/back pain, nausea, vomiting or bleeding.  He does have occ cough. Additional medical history as below. ? ?Past Medical History:  ?Diagnosis Date  ? Anemia   ? Atrial fibrillation ([place])   ? post operative in 2018  ? Atrial flutter ([place])   ? Bacteremia 2018  ? Basal cell carcinoma   ? COPD (chronic obstructive pulmonary disease) ([place])   ? Dementia ([place])   ? Diabetes ([place])   ? 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 Hypertension   ? Thrombocytopenia ([place])   ? ?Past Surgical History:  ?Procedure Laterality Date  ? BALLOON ENTEROSCOPY N/A 11/26/2020  ? Procedure: BALLOON ENTEROSCOPY;  Surgeon: Lavena Bullion, DO;  Location: [facility];  Service: Gastroenterology;  Laterality: N/A;  Midday please  ? BIOPSY  10/21/2020  ? Procedure: BIOPSY;  Surgeon: Doran Stabler, MD;  Location: [facility];  Service: Gastroenterology;;  ? BIOPSY  11/21/2020  ? Procedure: BIOPSY;  Surgeon: Gatha Mayer, MD;  Location: [facility];  Service: Endoscopy;;  ? COLONOSCOPY WITH PROPOFOL N/A 11/22/2020  ? Procedure: COLONOSCOPY WITH PROPOFOL;  Surgeon: Gatha Mayer, MD;  Location: [facility] ENDOSCOPY;  Service: Endoscopy;  Laterality: N/A;  ? CYSTOSCOPY W/ URETERAL STENT PLACEMENT N/A 10/24/2020  ? Procedure: CYSTOSCOPY WITH RETROGRADE PYELOGRAM/URETERAL STENT PLACEMENT;  Surgeon: Ardis Hughs, MD;  Location: [facility];  Service: Urology;  Laterality: N/A;  ? CYSTOSCOPY WITH URETHRAL DILATATION  10/24/2020  ?  Procedure: URETHRAL DILATATION;  Surgeon: Ardis Hughs, MD;  [facility];  Service: Urology;;  ? ENTEROSCOPY N/A 11/21/2020  ? Procedure: ENTEROSCOPY;  Surgeon: Gatha Mayer, MD;  Location: [facility];  Service: Endoscopy;  Laterality: N/A;  ? ESOPHAGOGASTRODUODENOSCOPY (EGD) WITH PROPOFOL N/A 10/21/2020  ? Procedure: ESOPHAGOGASTRODUODENOSCOPY (EGD) WITH PROPOFOL;  Surgeon: Doran Stabler, MD;  Location: [facility];  Service: Gastroenterology;  Laterality: N/A;  ? GIVENS CAPSULE STUDY N/A 11/22/2020  ? Procedure: GIVENS CAPSULE STUDY;  Surgeon: Gatha Mayer, MD;  Location: [facility] ENDOSCOPY;  Service: Endoscopy;  Laterality: N/A;  ? HOT HEMOSTASIS N/A 11/26/2020  ? Procedure: HOT HEMOSTASIS (ARGON PLASMA COAGULATION/BICAP);  Surgeon: Lavena Bullion, DO;  Location: [facility] ENDOSCOPY;  Service: Gastroenterology;  Laterality: N/A;  ? MITRAL VALVE REPLACEMENT  07/2016  ? [facility] for endocarditis  ? PARTIAL COLECTOMY N/A 10/24/2020  ? Procedure: OPEN PARTIAL COLECTOMY WTH COLOSTOMY;  Surgeon: Rolm Bookbinder, MD;  Location: [place];  Service: General;  Laterality: N/A;  ? SIGMOIDOSCOPY  10/21/2020  ? Procedure: SIGMOIDOSCOPY;  Surgeon: Doran Stabler, MD;  Location: [place];  Service: Gastroenterology;;  ? SUBMUCOSAL INJECTION  10/21/2020  ? Procedure: SUBMUCOSAL INJECTION;  Surgeon: Doran Stabler, MD;  Location: [place];  Service: Gastroenterology;;  SPOT  ? SUBMUCOSAL TATTOO INJECTION  11/26/2020  ? Procedure: SUBMUCOSAL TATTOO INJECTION;  Surgeon: Lavena Bullion, DO;  Location: [facility];  Service: Gastroenterology;;  ? ? ? ? ?Allergies: ?Orange juice [orange oil] ? ?Medications: ?Prior to Admission  medications   ?Medication Sig Start Date End Date Taking? Authorizing Provider  ?acetaminophen (TYLENOL) 325 MG tablet Take 2 tablets (650 mg total) by mouth every 6 (six) hours as needed for mild pain or fever. 11/01/20  Yes Barkley Boards R, PA-C  ?levofloxacin (LEVAQUIN) 500 MG  tablet Take 1 tablet (500 mg total) by mouth daily. 08/25/21  Yes Owens Shark, NP  ?metoprolol tartrate (LOPRESSOR) 25 MG tablet Take 25 mg by mouth 2 (two) times daily. 08/25/21  Yes [provider]  ?ODOMZO 200 MG capsule Take 200 mg by mouth daily. 06/23/21  Yes [provider]  ?rosuvastatin (CRESTOR) 20 MG tablet Take 1 tablet (20 mg total) by mouth daily at 6 PM. 07/24/21  Yes Hensel, Jamal Collin, MD  ?albuterol (VENTOLIN HFA) 108 (90 Base) MCG/ACT inhaler Inhale 1-2 puffs into the lungs every 6 (six) hours as needed for wheezing or shortness of breath. ?Patient not taking: Reported on 08/25/2021 11/01/20   Modena Jansky, MD  ?digoxin (LANOXIN) 0.125 MG tablet Take 1 tablet (0.125 mg total) by mouth daily. ?Patient not taking: Reported on 08/25/2021 07/24/21   Zenia Resides, MD  ?diltiazem (CARDIZEM CD) 120 MG 24 hr capsule Take 1 capsule (120 mg total) by mouth daily. ?Patient not taking: Reported on 07/28/2021 07/04/21   Zenia Resides, MD  ?Ferrous Sulfate (IRON) 90 (18 Fe) MG TABS SMARTSIG:1 Tablet(s) By Mouth ?Patient not taking: Reported on 07/28/2021 02/07/21   [provider]  ?pantoprazole (PROTONIX) 40 MG tablet Take 1 tablet (40 mg total) by mouth daily at 6 (six) AM. ?Patient not taking: Reported on 06/13/2021 01/29/21   Zenia Resides, MD  ?SPIRIVA HANDIHALER 18 MCG inhalation capsule PLACE ONE CAPSULE INTO THE INHALER AND INHALE DAILY ?Patient not taking: Reported on 06/16/2021 03/04/21   Zenia Resides, MD  ? ? ? ?Vital Signs: ?BP 103/64 (BP Location: Right Arm)   Pulse (!) 59   Temp 97.8 ?F (36.6 ?C) (Oral)   Resp 15   SpO2 97%  ? ?Physical Exam awake, answering simple questions okay.  Chest with scattered rhonchi.  Heart with regular rate and rhythm.  Abdomen soft, positive bowel sounds, left lower quadrant colostomy intact.  No significant lower extremity edema. ?Scattered ecchymoses noted. ? ?Imaging: ?No results found. ? ?Labs: ? ?CBC: ?Recent Labs  ?   09/11/21 ?1040 09/15/21 ?0806 09/18/21 ?0071 09/22/21 ?1208  ?WBC 0.4* 0.6* 0.5* 0.5*  ?HGB 6.9* 8.2* 8.1* 7.5*  ?HCT 21.1* 25.0* 24.8* 22.7*  ?PLT 9* 7* 9* 11*  ? ? ?COAGS: ?Recent Labs  ?  11/20/20 ?1652 06/04/21 ?2137  ?INR 1.8* 1.2  ? ? ?BMP: ?Recent Labs  ?  08/18/21 ?2197 08/25/21 ?1054 09/01/21 ?1034 09/22/21 ?1208  ?NA 138 141 138 138  ?K 4.5 4.3 4.5 4.4  ?CL 106 108 104 106  ?CO2 '25 26 27 26  ' ?GLUCOSE 94 93 114* 90  ?BUN 34* 31* 23 27*  ?CALCIUM 9.3 9.1 9.3 9.8  ?CREATININE 1.12 0.99 0.90 1.04  ?GFRNONAA >60 >60 >60 >60  ? ? ?LIVER FUNCTION TESTS: ?Recent Labs  ?  08/18/21 ?5883 08/25/21 ?1054 09/01/21 ?1034 09/22/21 ?1208  ?BILITOT 1.0 1.1 1.3* 1.2  ?AST 20 79* 45* 52*  ?ALT 24 102* 85* 102*  ?ALKPHOS 81 87 97 117  ?PROT 7.5 7.4 7.8 8.0  ?ALBUMIN 3.8 3.6 3.7 3.6  ? ? ?Assessment and Plan: ?Patient known to IR service from prior bone marrow biopsy on 06/10/2021.  He  has a history of colon cancer, atrial flutter, prior mitral valve replacement, COPD, hypertension, diabetes, basal cell carcinoma, mild dementia and persistent severe pancytopenia with evolving acute leukemia.  He has undergone 2 cycles of chemotherapy.  He underwent bone marrow biopsy on 06/10/2021.  He presents again today for follow-up CT-guided bone marrow biopsy for further evaluation.Risks and benefits of procedure was discussed with the patient /son including, but not limited to bleeding, infection, damage to adjacent structures or low yield requiring additional tests. ? ?All of the questions were answered and there is agreement to proceed. ? ?Consent signed and in chart. ? ? ? ?Electronically Signed: ?Autumn Messing, PA-C ?09/23/2021, 7:59 AM ? ? ?I spent a total of 20 minutes at the the patient's bedside AND on the patient's hospital floor or unit, greater than 50% of which was counseling/coordinating care for CT-guided bone marrow biopsy ? ? ? ? ? ?

## 2021-09-23 NOTE — Procedures (Signed)
Interventional Radiology Procedure:   Indications: Pancytopenia  Procedure: CT guided bone marrow biopsy  Findings: 2 aspirates and 2 cores from right ilium  Complications: None     EBL: Minimal, less than 10 ml  Plan: Discharge to home in one hour.   Tremon Sainvil R. Malyia Moro, MD  Pager: [phone]   

## 2021-09-24 LAB — CBC WITH DIFFERENTIAL/PLATELET
Abs Immature Granulocytes: 0 10*3/uL (ref 0.00–0.07)
Basophils Absolute: 0 10*3/uL (ref 0.0–0.1)
Basophils Relative: 0 %
Eosinophils Absolute: 0 10*3/uL (ref 0.0–0.5)
Eosinophils Relative: 0 %
HCT: 21.1 % — ABNORMAL LOW (ref 39.0–52.0)
Hemoglobin: 7 g/dL — ABNORMAL LOW (ref 13.0–17.0)
Immature Granulocytes: 0 %
Lymphocytes Relative: 83 %
Lymphs Abs: 0.4 10*3/uL — ABNORMAL LOW (ref 0.7–4.0)
MCH: 30.8 pg (ref 26.0–34.0)
MCHC: 33.2 g/dL (ref 30.0–36.0)
MCV: 93 fL (ref 80.0–100.0)
Monocytes Absolute: 0 10*3/uL — ABNORMAL LOW (ref 0.1–1.0)
Monocytes Relative: 5 %
Neutro Abs: 0.1 10*3/uL — CL (ref 1.7–7.7)
Neutrophils Relative %: 12 %
Platelets: 11 10*3/uL — CL (ref 150–400)
RBC: 2.27 MIL/uL — ABNORMAL LOW (ref 4.22–5.81)
RDW: 16.6 % — ABNORMAL HIGH (ref 11.5–15.5)
WBC: 0.4 10*3/uL — CL (ref 4.0–10.5)
nRBC: 0 % (ref 0.0–0.2)

## 2021-09-25 ENCOUNTER — Telehealth: Payer: Self-pay

## 2021-09-25 ENCOUNTER — Inpatient Hospital Stay: Payer: Medicare Other

## 2021-09-25 ENCOUNTER — Encounter: Payer: Self-pay | Admitting: Nurse Practitioner

## 2021-09-25 ENCOUNTER — Inpatient Hospital Stay (HOSPITAL_BASED_OUTPATIENT_CLINIC_OR_DEPARTMENT_OTHER): Payer: Medicare Other | Admitting: Nurse Practitioner

## 2021-09-25 VITALS — BP 103/77 | HR 100 | Temp 97.8°F | Resp 18 | Ht 72.0 in | Wt 143.2 lb

## 2021-09-25 DIAGNOSIS — D696 Thrombocytopenia, unspecified: Secondary | ICD-10-CM | POA: Diagnosis not present

## 2021-09-25 DIAGNOSIS — C9200 Acute myeloblastic leukemia, not having achieved remission: Secondary | ICD-10-CM

## 2021-09-25 DIAGNOSIS — C187 Malignant neoplasm of sigmoid colon: Secondary | ICD-10-CM | POA: Diagnosis not present

## 2021-09-25 LAB — CBC WITH DIFFERENTIAL (CANCER CENTER ONLY)
Abs Immature Granulocytes: 0 10*3/uL (ref 0.00–0.07)
Basophils Absolute: 0 10*3/uL (ref 0.0–0.1)
Basophils Relative: 0 %
Eosinophils Absolute: 0 10*3/uL (ref 0.0–0.5)
Eosinophils Relative: 0 %
HCT: 21.9 % — ABNORMAL LOW (ref 39.0–52.0)
Hemoglobin: 7.3 g/dL — ABNORMAL LOW (ref 13.0–17.0)
Lymphocytes Relative: 84 %
Lymphs Abs: 0.4 10*3/uL — ABNORMAL LOW (ref 0.7–4.0)
MCH: 30.3 pg (ref 26.0–34.0)
MCHC: 33.3 g/dL (ref 30.0–36.0)
MCV: 90.9 fL (ref 80.0–100.0)
Monocytes Absolute: 0 10*3/uL — ABNORMAL LOW (ref 0.1–1.0)
Monocytes Relative: 2 %
Neutro Abs: 0.1 10*3/uL — CL (ref 1.7–7.7)
Neutrophils Relative %: 14 %
Platelet Count: 6 10*3/uL — CL (ref 150–400)
RBC: 2.41 MIL/uL — ABNORMAL LOW (ref 4.22–5.81)
RDW: 16.5 % — ABNORMAL HIGH (ref 11.5–15.5)
Smear Review: DECREASED
WBC Count: 0.5 10*3/uL — CL (ref 4.0–10.5)
WBC Morphology: ABNORMAL
nRBC: 0 % (ref 0.0–0.2)

## 2021-09-25 LAB — SURGICAL PATHOLOGY

## 2021-09-25 LAB — SAMPLE TO BLOOD BANK

## 2021-09-25 MED ORDER — SODIUM CHLORIDE 0.9% IV SOLUTION
250.0000 mL | Freq: Once | INTRAVENOUS | Status: AC
  Administered 2021-09-25: 250 mL via INTRAVENOUS

## 2021-09-25 NOTE — Progress Notes (Signed)
Patient seen by Ned Card NP today ? ?Vitals are within treatment parameters. ? ?Labs reviewed by Ned Card NP and are still pending. Ok to proceed with labs in treatment parameters.  ? ?Per physician team, Pt is ready for 2 units of PRBCs today.  ? ?

## 2021-09-25 NOTE — Progress Notes (Signed)
?[name] ?OFFICE PROGRESS NOTE ? ? ?Diagnosis: Myelodysplasia ? ?INTERVAL HISTORY:  ? ?[name].  Shawn underwent a bone marrow biopsy 09/23/2021.  Overall Shawn is feeling well.  Shawn does note a bit of a decrease in his energy level.  No fever.  No bleeding.   ? ?Objective: ? ?Vital signs in last 24 hours: ? ?Blood pressure 103/77, pulse 100, temperature 97.8 ?F (36.6 ?C), temperature source Oral, resp. rate 18, height 6' (1.829 m), weight 143 lb 3.2 oz (65 kg), SpO2 100 %. ?  ? ?HEENT: No thrush or ulcers.  No bleeding within the oral cavity. ?Resp: Scattered inspiratory rhonchi.  No respiratory distress. ?Cardio: Regular rate and rhythm. ?GI: Abdomen soft and nontender.  No hepatosplenomegaly.  Left lower quadrant colostomy. ?Vascular: No leg edema. ?Neuro: Alert and oriented. ?Skin: Ecchymoses scattered over the forearms.  Healed areas of previous skin cancers at the left forearm and left wrist. ? ? ?Lab Results: ? ?Lab Results  ?Component Value Date  ? WBC 0.5 (LL) 09/25/2021  ? HGB 7.3 (L) 09/25/2021  ? HCT 21.9 (L) 09/25/2021  ? MCV 90.9 09/25/2021  ? PLT 6 (LL) 09/25/2021  ? NEUTROABS 0.1 (LL) 09/25/2021  ? ? ?Imaging: ? ?No results found. ? ?Medications: I have reviewed the patient's current medications. ? ?Assessment/Plan: ?Sigmoid colon adenocarcinoma ?-Colonoscopy 10/21/2020- partially obstructing mass found in the sigmoid colon consistent with adenocarcinoma. ?-CEA on 10/22/2020 was 3.9 ?-CTs 10/22/2020-colonic mass with pelvic lymphadenopathy including right common iliac and external iliac nodes, small pulmonary nodules ?-10/24/2020-sigmoid colectomy, end colostomy, tumor stuck to pelvic sidewall with rind of tissue remaining at completion of surgery ?-Pathology- moderately differentiated adenocarcinoma the sigmoid colon, tumor extends into pericolonic connective tissue, no lymphovascular perineural invasion, 0/14 lymph nodes, carcinoma involves an area with the specimen  was disrupted at mesenteric margin; mismatch repair protein (IHC) normal; MSI stable ?-CTs 11/24/2020-interval resection of colonic mass, decreased size of previously noted right pelvic adenopathy, new noted at the right internal/external iliac bifurcation, unchanged pulmonary nodules ?-Guardant reveal 12/06/2020-ctDNA not detected ?-Cycle 1 adjuvant Xeloda anticipated 01/06/2021 ?-Cycle 2 adjuvant Xeloda 01/27/2021, Xeloda dose reduced secondary to thrombocytopenia (Shawn took 1 pill a day for an unclear amount of time, less than 14 days) ?-Cycle 3 adjuvant Xeloda 02/17/2021 ?2.  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 ?3.  Thrombocytopenia-chronic ?4.  Atrial flutter ?5.  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  COPD ?7.  Hypertension ?8.  Diabetes mellitus ?9.  Dementia ?10.  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  ?13.  Admission 06/05/2021 with presyncope ?14.  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  Elevated LFTs 08/25/2021 ? ?Disposition: [name] appears unchanged.  Shawn  has persistent severe pancytopenia.  Shawn is Thomas to receive 2  units of blood today.  Shawn will also receive a unit of platelets.   ? 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  This information was reviewed with [name] at today's visit.  Shawn understands the recommendation is to refer to the leukemia service at [facility] to initiate urgent treatment.  [name] and will contact us first thing tomorrow morning with his decision.  If Shawn decides Shawn does not want to pursue treatment we discussed a hospice referral with supportive/comfort care. ? ?Patient seen with Dr. Benay Spice. ? ?Ned Card ANP/GNP-BC  ? ?09/25/2021  ?2:04 PM ? ?This was a shared visit with Ned Card.  We discussed the bone marrow findings, prognosis, and treatment options with [name].  I explained the likelihood of survival beyond weeks is poor in the absence of treatment for AML.  We discussed comfort care/hospice.  I discussed the case with Dr. Joan Mayans on the leukemia service at [facility].  She agrees to see her [name] within the next few days.  [name] is undecided on whether Shawn will agree evaluation and treatment by the leukemia service at [facility].  Shawn will contact us with his decision in the morning. ? ?I was present for greater than 50% of today's visit.  I performed medical decision making. ? ?Julieanne Manson, MD` ? ? ? ? ? ?

## 2021-09-25 NOTE — Telephone Encounter (Signed)
CRITICAL VALUE STICKER ? ?CRITICAL VALUE: WBC 0.5, Plt 6, ANC 0.1 ? ?RECEIVER (on-site recipient of call):Makyla Bye Merrilyn Puma LPN ? ?DATE & TIME NOTIFIED: 09/25/21 923 ? ?MESSENGER (representative from lab): Sande Rives [facility]  ? ? ?MD NOTIFIED: Ned Card NP  ? ?TIME OF NOTIFICATION: 925 ? ?RESPONSE: Pt will receive 1 unit of Platelets  ? ?

## 2021-09-25 NOTE — Telephone Encounter (Signed)
[facility]  at [phone] and spoke with Helene Kelp to request a provider to page or call Dr. Benay Spice for adult leukemia service. ?

## 2021-09-25 NOTE — Patient Instructions (Signed)
Blood Transfusion, Adult, Care After ?This sheet gives you information about how to care for yourself after your procedure. Your doctor may also give you more specific instructions. If you have problems or questions, contact your doctor. ?What can I expect after the procedure? ?After the procedure, it is common to have: ?Bruising and soreness at the IV site. ?A headache. ?Follow these instructions at home: ?Insertion site care ? ?  ? ?Follow instructions from your doctor about how to take care of your insertion site. This is where an IV tube was put into your vein. Make sure you: ?Wash your hands with soap and water before and after you change your bandage (dressing). If you cannot use soap and water, use hand sanitizer. ?Change your bandage as told by your doctor. ?Check your insertion site every day for signs of infection. Check for: ?Redness, swelling, or pain. ?Bleeding from the site. ?Warmth. ?Pus or a bad smell. ?General instructions ?Take over-the-counter and prescription medicines only as told by your doctor. ?Rest as told by your doctor. ?Go back to your normal activities as told by your doctor. ?Keep all follow-up visits as told by your doctor. This is important. ?Contact a doctor if: ?You have itching or red, swollen areas of skin (hives). ?You feel worried or nervous (anxious). ?You feel weak after doing your normal activities. ?You have redness, swelling, warmth, or pain around the insertion site. ?You have blood coming from the insertion site, and the blood does not stop with pressure. ?You have pus or a bad smell coming from the insertion site. ?Get help right away if: ?You have signs of a serious reaction. This may be coming from an allergy or the body's defense system (immune system). Signs include: ?Trouble breathing or shortness of breath. ?Swelling of the face or feeling warm (flushed). ?Fever or chills. ?Head, chest, or back pain. ?Dark pee (urine) or blood in the pee. ?Widespread rash. ?Fast  heartbeat. ?Feeling dizzy or light-headed. ?You may receive your blood transfusion in an outpatient setting. If so, you will be told whom to contact to report any reactions. ?These symptoms may be an emergency. Do not wait to see if the symptoms will go away. Get medical help right away. Call your local emergency services (911 in the [place] Do not drive yourself to the hospital. ?Summary ?Bruising and soreness at the IV site are common. ?Check your insertion site every day for signs of infection. ?Rest as told by your doctor. Go back to your normal activities as told by your doctor. ?Get help right away if you have signs of a serious reaction. ?This information is not intended to replace advice given to you by your health care provider. Make sure you discuss any questions you have with your health care provider. ?Document Revised: 09/12/2020 Document Reviewed: 11/10/2018 ?Elsevier Patient Education ? [place]. ? ?Platelet Transfusion ?A platelet transfusion is a procedure in which a person receives donated platelets through an IV. Platelets are parts of blood that stick together and form a clot to help the body stop bleeding after an injury. If you have too few platelets, your blood may have trouble clotting. This may cause you to bleed and bruise very easily. ?You may need a platelet transfusion if you have a condition that causes a low number of platelets (thrombocytopenia). A platelet transfusion may be used to stop or prevent excessive bleeding. ?Tell a health care provider about: ?Any reactions you have had during previous transfusions. ?Any allergies you have. ?All  medicines you are taking, including vitamins, herbs, eye drops, creams, and over-the-counter medicines. ?Any bleeding problems you have. ?Any surgeries you have had. ?Any medical conditions you have. ?Whether you are pregnant or may be pregnant. ?What are the risks? ?Generally, this is a safe procedure. However, problems may occur,  including: ?Fever. ?Infection. ?Allergic reaction to the donated (donor) platelets. ?Your body's disease-fighting system (immune system) attacking the donor platelets (hemolytic reaction). This is rare. ?A rare reaction that causes lung damage (transfusion-related acute lung injury). ?What happens before the procedure? ?Medicines ?Ask your health care provider about: ?Changing or stopping your regular medicines. This is especially important if you are taking diabetes medicines or blood thinners. ?Taking medicines such as aspirin and ibuprofen. These medicines can thin your blood. Do not take these medicines unless your health care provider tells you to take them. ?Taking over-the-counter medicines, vitamins, herbs, and supplements. ?General instructions ?You will have a blood test to determine your blood type. Your blood type determines what kind of platelets you will be given. ?Follow instructions from your health care provider about eating or drinking restrictions. ?If you have had an allergic reaction to a transfusion in the past, you may be given medicine to help prevent a reaction. ?Your temperature, blood pressure, pulse, and breathing will be monitored. ?What happens during the procedure? ? ?An IV will be inserted into one of your veins. ?For your safety, two health care providers will verify your identity along with the donor platelets about to be infused. ?A bag of donor platelets will be connected to your IV. The platelets will flow into your bloodstream. This usually takes 30-60 minutes. ?Your temperature, blood pressure, pulse, and breathing will be monitored during the transfusion. This helps detect early signs of any reaction. ?You will also be monitored for other symptoms that may indicate a reaction, including chills, hives, or itching. ?If you have signs of a reaction at any time, your transfusion will be stopped, and you may be given medicine to help manage the reaction. ?When your transfusion is  complete, your IV will be removed. ?Pressure may be applied to the IV site for a few minutes to stop any bleeding. ?The IV site will be covered with a bandage (dressing). ?The procedure may vary among health care providers and hospitals. ?What can I expect after the procedure? ?Your blood pressure, temperature, pulse, and breathing will be monitored until you leave the hospital or clinic. ?You may have some bruising and soreness at your IV site. ?Follow these instructions at home: ?Medicines ?Take over-the-counter and prescription medicines only as told by your health care provider. ?Talk with your health care provider before you take any medicines that contain aspirin or NSAIDs, such as ibuprofen. These medicines increase your risk for dangerous bleeding. ?IV site care ?Check your IV site every day for signs of infection. Check for: ?Redness, swelling, or pain. ?Fluid or blood. If fluid or blood drains from your IV site, use your hands to press down firmly on a bandage covering the area for a minute or two. Doing this should stop the bleeding. ?Warmth. ?Pus or a bad smell. ?General instructions ?Change or remove your dressing as told by your health care provider. ?Return to your normal activities as told by your health care provider. Ask your health care provider what activities are safe for you. ?Do not take baths, swim, or use a hot tub until your health care provider approves. Ask your health care provider if you may take showers. ?Keep  all follow-up visits. This is important. ?Contact a health care provider if: ?You have a headache that does not go away with medicine. ?You have hives, rash, or itchy skin. ?You have nausea or vomiting. ?You feel unusually tired or weak. ?You have signs of infection at your IV site. ?Get help right away if: ?You have a fever or chills. ?You urinate less often than usual. ?Your urine is darker colored than normal. ?You have any of the following: ?Trouble breathing. ?Pain in your  back, abdomen, or chest. ?Cool, clammy skin. ?A fast heartbeat. ?Summary ?Platelets are tiny pieces of blood cells that clump together to form a blood clot when you have an injury. If you have too few platelets,

## 2021-09-26 ENCOUNTER — Encounter: Payer: Self-pay | Admitting: Oncology

## 2021-09-26 ENCOUNTER — Telehealth: Payer: Self-pay

## 2021-09-26 LAB — TYPE AND SCREEN
ABO/RH(D): AB POS
Antibody Screen: NEGATIVE
Unit division: 0
Unit division: 0

## 2021-09-26 LAB — PREPARE PLATELET PHERESIS: Unit division: 0

## 2021-09-26 LAB — BPAM RBC
Blood Product Expiration Date: 202305182359
Blood Product Expiration Date: 202305182359
ISSUE DATE / TIME: 202304270807
ISSUE DATE / TIME: 202304270807
Unit Type and Rh: 6200
Unit Type and Rh: 6200

## 2021-09-26 LAB — BPAM PLATELET PHERESIS
Blood Product Expiration Date: 202304282359
ISSUE DATE / TIME: 202304270944
Unit Type and Rh: 6200

## 2021-09-26 NOTE — Telephone Encounter (Signed)
I called and spoke with [name] the patient son to see what [name] decide to do about treatment at [facility]. Mr. Kessen decided to have treatment at [facility]. The patient was place on first floor at the cancer center and his ride was arranged. The patient will be pick up at 1500 by [name] and the patient is aware he will be admitting to the Cancer center for treatment. I advice the patient to pack a bag of clothes. Patient and son gave verbal understanding. ?

## 2021-09-29 ENCOUNTER — Other Ambulatory Visit: Payer: Medicare Other

## 2021-09-29 ENCOUNTER — Other Ambulatory Visit: Payer: Self-pay | Admitting: Family Medicine

## 2021-09-30 ENCOUNTER — Other Ambulatory Visit: Payer: Self-pay | Admitting: *Deleted

## 2021-09-30 DIAGNOSIS — C9200 Acute myeloblastic leukemia, not having achieved remission: Secondary | ICD-10-CM

## 2021-09-30 NOTE — Progress Notes (Signed)
Spoke w/[facility] Case Manager, Gerrianne Scale to ensure [name] has transportation for his 10/06/21 lab/PICC flush. Notified scheduler to arrange transport. Requested RN please fax report to document PICC placement so we can use it here. She agrees to fax this. ?

## 2021-10-01 ENCOUNTER — Other Ambulatory Visit: Payer: Self-pay | Admitting: *Deleted

## 2021-10-01 ENCOUNTER — Encounter: Payer: Self-pay | Admitting: *Deleted

## 2021-10-01 ENCOUNTER — Encounter (HOSPITAL_COMMUNITY): Payer: Self-pay | Admitting: Oncology

## 2021-10-01 DIAGNOSIS — C9200 Acute myeloblastic leukemia, not having achieved remission: Secondary | ICD-10-CM

## 2021-10-01 NOTE — Progress Notes (Signed)
Received faxed nurse note from [facility] re: PICC placement on 09/29/21: ?#5 FR: Power Dual Lumen PICC into right brachial vein. ?Cath inserted to 36 cm with 0 cm exposed. ?CXR report: ?1.  Right upper extremity PICC with tip projecting at the level of the mid SVC.  ?2.  Otherwise unchanged. Overall findings may be due to mild interstitial pulmonary edema, atypical infection or diffuse aspiration. Stable cardiomegaly ?

## 2021-10-02 ENCOUNTER — Telehealth: Payer: Self-pay

## 2021-10-02 ENCOUNTER — Other Ambulatory Visit: Payer: Medicare Other

## 2021-10-02 NOTE — Telephone Encounter (Signed)
-----   Message from Ladell Pier, MD sent at 10/01/2021  8:06 PM EDT ----- ?Copy result to Dr. Alesia Morin [facility] ?

## 2021-10-02 NOTE — Telephone Encounter (Signed)
Results faxed to [phone] ?

## 2021-10-02 NOTE — Telephone Encounter (Signed)
Results faxed to [facility] Attention to Dr Joan Mayans  [phone] ?

## 2021-10-02 NOTE — Telephone Encounter (Signed)
-----   Message from Ladell Pier, MD sent at 10/01/2021  8:04 PM EDT ----- ?Copy of result to Dr. Joan Mayans at [facility] ?

## 2021-10-06 ENCOUNTER — Encounter: Payer: Self-pay | Admitting: Nurse Practitioner

## 2021-10-06 ENCOUNTER — Inpatient Hospital Stay: Payer: Medicare Other

## 2021-10-06 ENCOUNTER — Telehealth: Payer: Self-pay | Admitting: *Deleted

## 2021-10-06 ENCOUNTER — Telehealth: Payer: Self-pay

## 2021-10-06 ENCOUNTER — Other Ambulatory Visit: Payer: Medicare Other

## 2021-10-06 ENCOUNTER — Inpatient Hospital Stay: Payer: Medicare Other | Attending: Nurse Practitioner

## 2021-10-06 ENCOUNTER — Inpatient Hospital Stay (HOSPITAL_BASED_OUTPATIENT_CLINIC_OR_DEPARTMENT_OTHER): Payer: Medicare Other | Admitting: Nurse Practitioner

## 2021-10-06 ENCOUNTER — Other Ambulatory Visit: Payer: Self-pay

## 2021-10-06 VITALS — BP 107/62 | HR 70 | Temp 97.8°F | Resp 18 | Ht 72.0 in | Wt 151.4 lb

## 2021-10-06 DIAGNOSIS — I10 Essential (primary) hypertension: Secondary | ICD-10-CM | POA: Insufficient documentation

## 2021-10-06 DIAGNOSIS — D61818 Other pancytopenia: Secondary | ICD-10-CM | POA: Diagnosis not present

## 2021-10-06 DIAGNOSIS — F0390 Unspecified dementia without behavioral disturbance: Secondary | ICD-10-CM | POA: Insufficient documentation

## 2021-10-06 DIAGNOSIS — I959 Hypotension, unspecified: Secondary | ICD-10-CM | POA: Diagnosis not present

## 2021-10-06 DIAGNOSIS — R000 Tachycardia, unspecified: Secondary | ICD-10-CM | POA: Insufficient documentation

## 2021-10-06 DIAGNOSIS — C9200 Acute myeloblastic leukemia, not having achieved remission: Secondary | ICD-10-CM

## 2021-10-06 DIAGNOSIS — F17210 Nicotine dependence, cigarettes, uncomplicated: Secondary | ICD-10-CM | POA: Diagnosis not present

## 2021-10-06 DIAGNOSIS — J449 Chronic obstructive pulmonary disease, unspecified: Secondary | ICD-10-CM | POA: Diagnosis not present

## 2021-10-06 DIAGNOSIS — R748 Abnormal levels of other serum enzymes: Secondary | ICD-10-CM | POA: Diagnosis not present

## 2021-10-06 DIAGNOSIS — E119 Type 2 diabetes mellitus without complications: Secondary | ICD-10-CM | POA: Insufficient documentation

## 2021-10-06 DIAGNOSIS — D509 Iron deficiency anemia, unspecified: Secondary | ICD-10-CM | POA: Insufficient documentation

## 2021-10-06 DIAGNOSIS — C187 Malignant neoplasm of sigmoid colon: Secondary | ICD-10-CM | POA: Insufficient documentation

## 2021-10-06 DIAGNOSIS — D696 Thrombocytopenia, unspecified: Secondary | ICD-10-CM | POA: Diagnosis not present

## 2021-10-06 LAB — CBC WITH DIFFERENTIAL (CANCER CENTER ONLY)
Abs Immature Granulocytes: 0 10*3/uL (ref 0.00–0.07)
Basophils Absolute: 0 10*3/uL (ref 0.0–0.1)
Basophils Relative: 0 %
Eosinophils Absolute: 0 10*3/uL (ref 0.0–0.5)
Eosinophils Relative: 0 %
HCT: 24.3 % — ABNORMAL LOW (ref 39.0–52.0)
Hemoglobin: 8.2 g/dL — ABNORMAL LOW (ref 13.0–17.0)
Lymphocytes Relative: 80 %
Lymphs Abs: 0.2 10*3/uL — ABNORMAL LOW (ref 0.7–4.0)
MCH: 30.4 pg (ref 26.0–34.0)
MCHC: 33.7 g/dL (ref 30.0–36.0)
MCV: 90 fL (ref 80.0–100.0)
Monocytes Absolute: 0 10*3/uL — ABNORMAL LOW (ref 0.1–1.0)
Monocytes Relative: 8 %
Neutro Abs: 0 10*3/uL — CL (ref 1.7–7.7)
Neutrophils Relative %: 12 %
Platelet Count: <5 10*3/uL — CL (ref 150–400)
RBC: 2.7 MIL/uL — ABNORMAL LOW (ref 4.22–5.81)
RDW: 15.1 % (ref 11.5–15.5)
Smear Review: DECREASED
WBC Count: 0.3 10*3/uL — CL (ref 4.0–10.5)
nRBC: 0 % (ref 0.0–0.2)

## 2021-10-06 LAB — SAMPLE TO BLOOD BANK

## 2021-10-06 LAB — CMP (CANCER CENTER ONLY)
ALT: 87 U/L — ABNORMAL HIGH (ref 0–44)
AST: 30 U/L (ref 15–41)
Albumin: 3.1 g/dL — ABNORMAL LOW (ref 3.5–5.0)
Alkaline Phosphatase: 144 U/L — ABNORMAL HIGH (ref 38–126)
Anion gap: 8 (ref 5–15)
BUN: 35 mg/dL — ABNORMAL HIGH (ref 8–23)
CO2: 23 mmol/L (ref 22–32)
Calcium: 8.5 mg/dL — ABNORMAL LOW (ref 8.9–10.3)
Chloride: 109 mmol/L (ref 98–111)
Creatinine: 1.2 mg/dL (ref 0.61–1.24)
GFR, Estimated: >60 mL/min (ref >60)
Glucose, Bld: 106 mg/dL — ABNORMAL HIGH (ref 70–99)
Potassium: 4.1 mmol/L (ref 3.5–5.1)
Sodium: 140 mmol/L (ref 135–145)
Total Bilirubin: 1.8 mg/dL — ABNORMAL HIGH (ref 0.3–1.2)
Total Protein: 6.8 g/dL (ref 6.5–8.1)

## 2021-10-06 LAB — MAGNESIUM: Magnesium: 1.9 mg/dL (ref 1.7–2.4)

## 2021-10-06 MED ORDER — HEPARIN SOD (PORK) LOCK FLUSH 100 UNIT/ML IV SOLN
250.0000 [IU] | INTRAVENOUS | Status: AC | PRN
  Administered 2021-10-06: 250 [IU]

## 2021-10-06 MED ORDER — SODIUM CHLORIDE 0.9% FLUSH
3.0000 mL | INTRAVENOUS | Status: AC | PRN
  Administered 2021-10-06: 3 mL

## 2021-10-06 MED ORDER — SODIUM CHLORIDE 0.9% IV SOLUTION
250.0000 mL | Freq: Once | INTRAVENOUS | Status: AC
  Administered 2021-10-06: 250 mL via INTRAVENOUS

## 2021-10-06 NOTE — Telephone Encounter (Signed)
CRITICAL VALUE STICKER ? ?CRITICAL VALUE: WBC 0.3, ANC 0, Pltc <5 ? ?RECEIVER (on-site recipient of call):Nasia Cannan,RN ? ?DATE & TIME NOTIFIED: 10/06/21 @ 1141 ? ?MESSENGER (representative from lab):Ned Card, NP ? ?MD NOTIFIED: Ned Card, NP ? ?TIME OF NOTIFICATION: 1150 ? ?RESPONSE:  Transfuse 1 unit irradiated platelets today. ? ?Called above critical results to [facility] w/Dr. Joan Mayans. She will call back if MD wants any order changes. Results also faxed to [phone]. ?

## 2021-10-06 NOTE — Patient Instructions (Signed)
Platelet Transfusion A platelet transfusion is a procedure in which a person receives donated platelets through an IV. Platelets are parts of blood that stick together and form a clot to help the body stop bleeding after an injury. If you have too few platelets, your blood may have trouble clotting. This may cause you to bleed and bruise very easily. You may need a platelet transfusion if you have a condition that causes a low number of platelets (thrombocytopenia). A platelet transfusion may be used to stop or prevent excessive bleeding. Tell a health care provider about: Any reactions you have had during previous transfusions. Any allergies you have. All medicines you are taking, including vitamins, herbs, eye drops, creams, and over-the-counter medicines. Any bleeding problems you have. Any surgeries you have had. Any medical conditions you have. Whether you are pregnant or may be pregnant. What are the risks? Generally, this is a safe procedure. However, problems may occur, including: Fever. Infection. Allergic reaction to the donated (donor) platelets. Your body's disease-fighting system (immune system) attacking the donor platelets (hemolytic reaction). This is rare. A rare reaction that causes lung damage (transfusion-related acute lung injury). What happens before the procedure? Medicines Ask your health care provider about: Changing or stopping your regular medicines. This is especially important if you are taking diabetes medicines or blood thinners. Taking medicines such as aspirin and ibuprofen. These medicines can thin your blood. Do not take these medicines unless your health care provider tells you to take them. Taking over-the-counter medicines, vitamins, herbs, and supplements. General instructions You will have a blood test to determine your blood type. Your blood type determines what kind of platelets you will be given. Follow instructions from your health care provider  about eating or drinking restrictions. If you have had an allergic reaction to a transfusion in the past, you may be given medicine to help prevent a reaction. Your temperature, blood pressure, pulse, and breathing will be monitored. What happens during the procedure?  An IV will be inserted into one of your veins. For your safety, two health care providers will verify your identity along with the donor platelets about to be infused. A bag of donor platelets will be connected to your IV. The platelets will flow into your bloodstream. This usually takes 30-60 minutes. Your temperature, blood pressure, pulse, and breathing will be monitored during the transfusion. This helps detect early signs of any reaction. You will also be monitored for other symptoms that may indicate a reaction, including chills, hives, or itching. If you have signs of a reaction at any time, your transfusion will be stopped, and you may be given medicine to help manage the reaction. When your transfusion is complete, your IV will be removed. Pressure may be applied to the IV site for a few minutes to stop any bleeding. The IV site will be covered with a bandage (dressing). The procedure may vary among health care providers and hospitals. What can I expect after the procedure? Your blood pressure, temperature, pulse, and breathing will be monitored until you leave the hospital or clinic. You may have some bruising and soreness at your IV site. Follow these instructions at home: Medicines Take over-the-counter and prescription medicines only as told by your health care provider. Talk with your health care provider before you take any medicines that contain aspirin or NSAIDs, such as ibuprofen. These medicines increase your risk for dangerous bleeding. IV site care Check your IV site every day for signs of infection. Check for:   Redness, swelling, or pain. Fluid or blood. If fluid or blood drains from your IV site, use your  hands to press down firmly on a bandage covering the area for a minute or two. Doing this should stop the bleeding. Warmth. Pus or a bad smell. General instructions Change or remove your dressing as told by your health care provider. Return to your normal activities as told by your health care provider. Ask your health care provider what activities are safe for you. Do not take baths, swim, or use a hot tub until your health care provider approves. Ask your health care provider if you may take showers. Keep all follow-up visits. This is important. Contact a health care provider if: You have a headache that does not go away with medicine. You have hives, rash, or itchy skin. You have nausea or vomiting. You feel unusually tired or weak. You have signs of infection at your IV site. Get help right away if: You have a fever or chills. You urinate less often than usual. Your urine is darker colored than normal. You have any of the following: Trouble breathing. Pain in your back, abdomen, or chest. Cool, clammy skin. A fast heartbeat. Summary Platelets are tiny pieces of blood cells that clump together to form a blood clot when you have an injury. If you have too few platelets, your blood may have trouble clotting. A platelet transfusion is a procedure in which you receive donated platelets through an IV. A platelet transfusion may be used to stop or prevent excessive bleeding. After the procedure, check your IV site every day for signs of infection. This information is not intended to replace advice given to you by your health care provider. Make sure you discuss any questions you have with your health care provider. Document Revised: 11/21/2020 Document Reviewed: 11/21/2020 Elsevier Patient Education  2023 Elsevier Inc.  

## 2021-10-06 NOTE — Progress Notes (Signed)
?[facility] ?OFFICE PROGRESS NOTE ? ? 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 ?INTERVAL HISTORY:  ? ?[name] returns as scheduled.  He was admitted to [facility] 09/26/2021.  He began cycle 1 decitabine and venetoclax  09/29/2021.  He was discharged home 10/03/2021.  CBC on 10/03/2021 returned with a hemoglobin of 8.3, white count 0.4, ANC 0.1 and platelet count 13,000.  He reports receiving platelets. ? ?He states he feels well.  He denies bleeding.  No fever.  No nausea or vomiting.  No mouth sores.  No diarrhea. ? ?Objective: ? ?Vital signs in last 24 hours: ? ?Blood pressure 107/62, pulse 70, temperature 97.8 ?F (36.6 ?C), temperature source Oral, resp. rate 18, height 6' (1.829 m), weight 151 lb 6.4 oz (68.7 kg), SpO2 99 %. ?  ? ?HEENT: No thrush or ulcers.  Ecchymosis right buccal mucosa. ?Resp: Scattered rhonchi.  No respiratory distress. ?Cardio: Regular rate and rhythm. ?GI: No hepatosplenomegaly.  Left lower quadrant colostomy with soft brown stool in the collection bag. ?Vascular: No leg edema. ?Skin: Ecchymoses scattered over the forearms, right greater than left. ?Right upper extremity PICC without erythema. ? ? ?Lab Results: ? ?Lab Results  ?Component Value Date  ? WBC 0.5 (LL) 09/25/2021  ? HGB 7.3 (L) 09/25/2021  ? HCT 21.9 (L) 09/25/2021  ? MCV 90.9 09/25/2021  ? PLT 6 (LL) 09/25/2021  ? NEUTROABS 0.1 (LL) 09/25/2021  ? ? ?Imaging: ? ?No results found. ? ?Medications: I have reviewed the patient's current medications. ? ?Assessment/Plan: ?Sigmoid colon adenocarcinoma ?-Colonoscopy 10/21/2020- partially obstructing mass found in the sigmoid colon consistent with adenocarcinoma. ?-CEA on 10/22/2020 was 3.9 ?-CTs 10/22/2020-colonic mass with pelvic lymphadenopathy including right common iliac and external iliac nodes, small pulmonary nodules ?-10/24/2020-sigmoid colectomy, end colostomy, tumor stuck to pelvic sidewall with rind of tissue remaining at completion of surgery ?-Pathology-  moderately differentiated adenocarcinoma the sigmoid colon, tumor extends into pericolonic connective tissue, no lymphovascular perineural invasion, 0/14 lymph nodes, carcinoma involves an area with the specimen was disrupted at mesenteric margin; mismatch repair protein (IHC) normal; MSI stable ?-CTs 11/24/2020-interval resection of colonic mass, decreased size of previously noted right pelvic adenopathy, new noted at the right internal/external iliac bifurcation, unchanged pulmonary nodules ?-Guardant reveal 12/06/2020-ctDNA not detected ?-Cycle 1 adjuvant Xeloda anticipated 01/06/2021 ?-Cycle 2 adjuvant Xeloda 01/27/2021, Xeloda dose reduced secondary to thrombocytopenia (he took 1 pill a day for an unclear amount of time, less than 14 days) ?-Cycle 3 adjuvant Xeloda 02/17/2021 ?2.  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 ?3.  Thrombocytopenia-chronic ?4.  Atrial flutter ?5.  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  COPD ?7.  Hypertension ?8.  Diabetes mellitus ?9.  Dementia ?10.  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  ?13.  Admission 06/05/2021 with presyncope ?14.  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  Elevated LFTs 08/25/2021 ? ?Disposition: [name] began cycle 1 decitabine and venetoclax on 09/29/2021.  He has persistent severe pancytopenia.  He will receive a platelet transfusion today.  We have forwarded a copy of today's labs to the providers at [facility].  He will likely need blood and platelets when he returns on 10/09/2021.  Labs will be done on a Monday Thursday schedule.  We will see him weekly on Thursdays beginning 10/16/2021.  He understands to contact the office with fever, chills, other signs of infection, bleeding. ? ?Patient seen with Dr. Benay Spice. ? ? ?Ned Card ANP/GNP-BC  ? ?10/06/2021  ?11:06 AM ?This was a shared visit with Ned Card.  [name] was interviewed and examined.  He is now at day 8 following initiation of cycle 1 decitabine/venetoclax.  He appears stable.  He has persistent severe pancytopenia.  We will continue to provide Red cell and platelet transfusion support.  He remains on antibiotic prophylaxis. ? ?I was present for greater than 50% of today's visit.  I performed medical decision making. ? ?Julieanne Manson, MD ? ? ? ? ? ? ?

## 2021-10-06 NOTE — Telephone Encounter (Signed)
Orders are in for RBCS and platelets, and slip is fax to [facility]. ? ?

## 2021-10-07 ENCOUNTER — Other Ambulatory Visit: Payer: Self-pay

## 2021-10-07 DIAGNOSIS — C9200 Acute myeloblastic leukemia, not having achieved remission: Secondary | ICD-10-CM

## 2021-10-07 LAB — PREPARE PLATELET PHERESIS: Unit division: 0

## 2021-10-07 LAB — BPAM PLATELET PHERESIS
Blood Product Expiration Date: 202305092359
ISSUE DATE / TIME: 202305081224
Unit Type and Rh: 8400

## 2021-10-07 LAB — PREPARE RBC (CROSSMATCH)

## 2021-10-08 IMAGING — DX DG CHEST 1V PORT
2 series · 2 of 2 positions shown · non-contrast
Comparison: 10/24/2020

CLINICAL DATA: Shortness of breath, hypotension

EXAM:
PORTABLE CHEST 1 VIEW

[chest ap (1 of 2)]
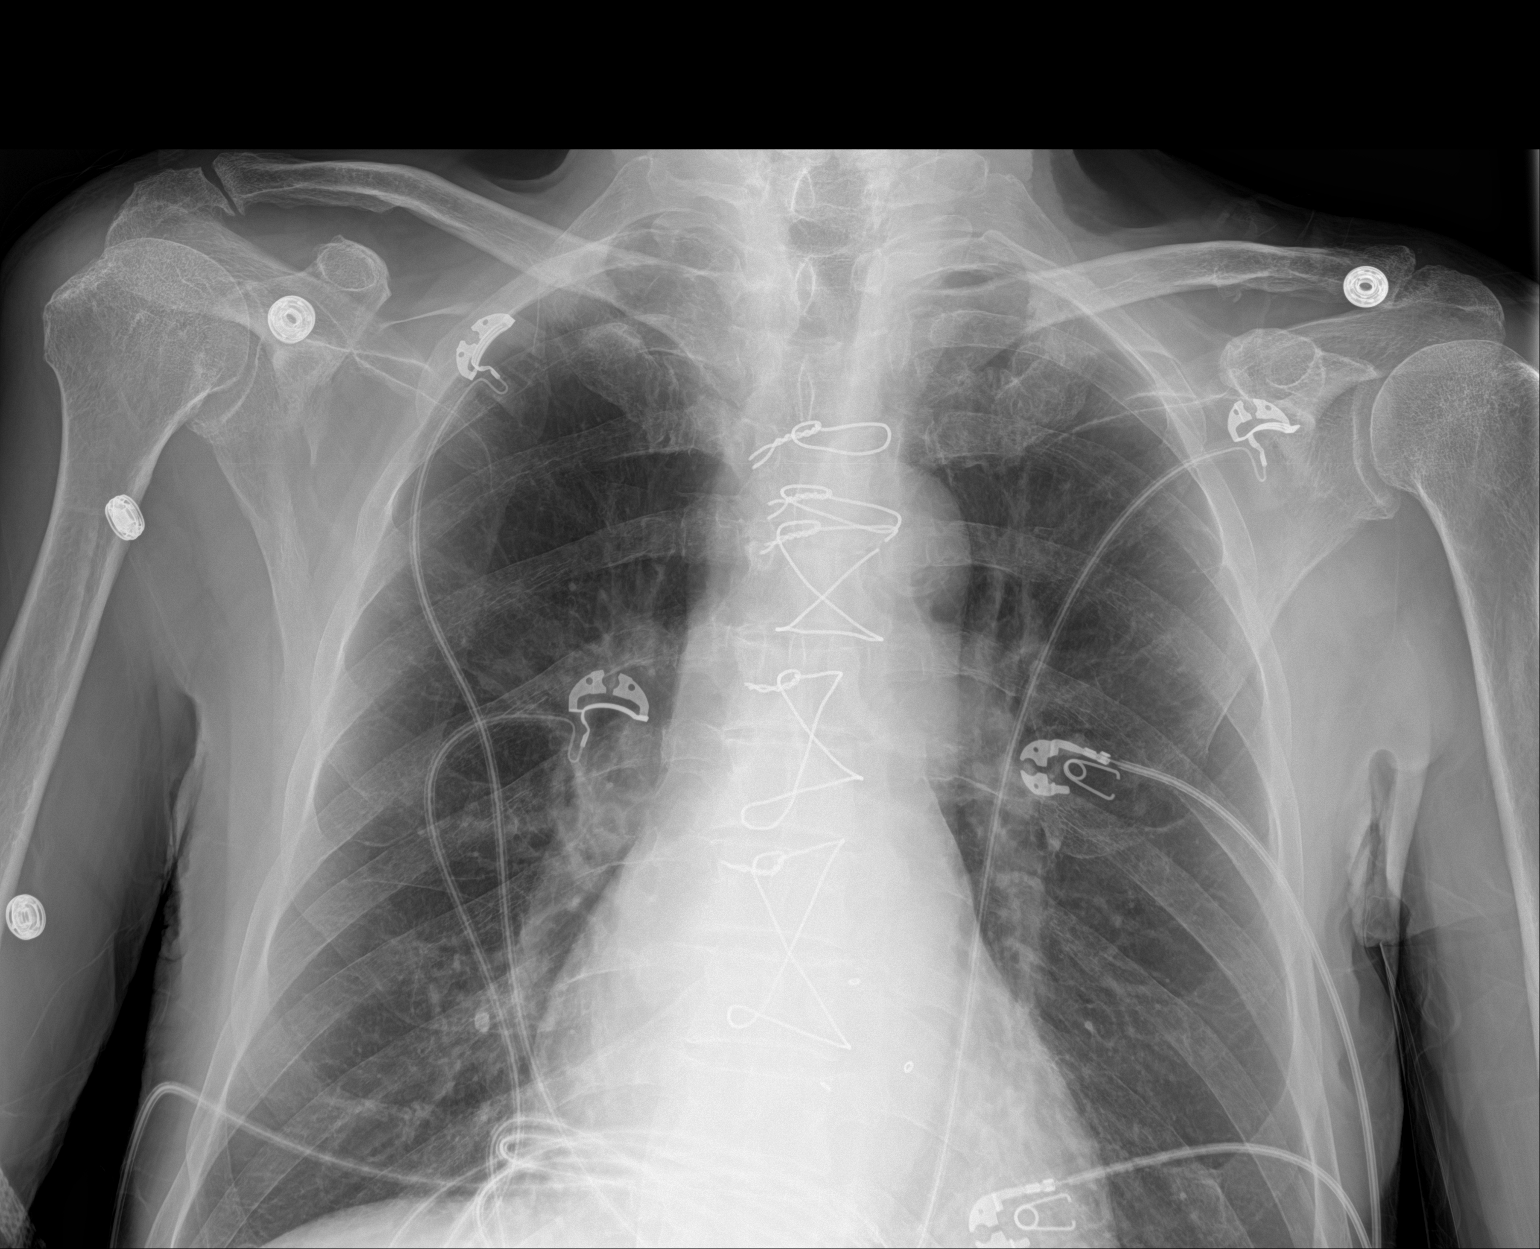

[chest ap (2 of 2)]
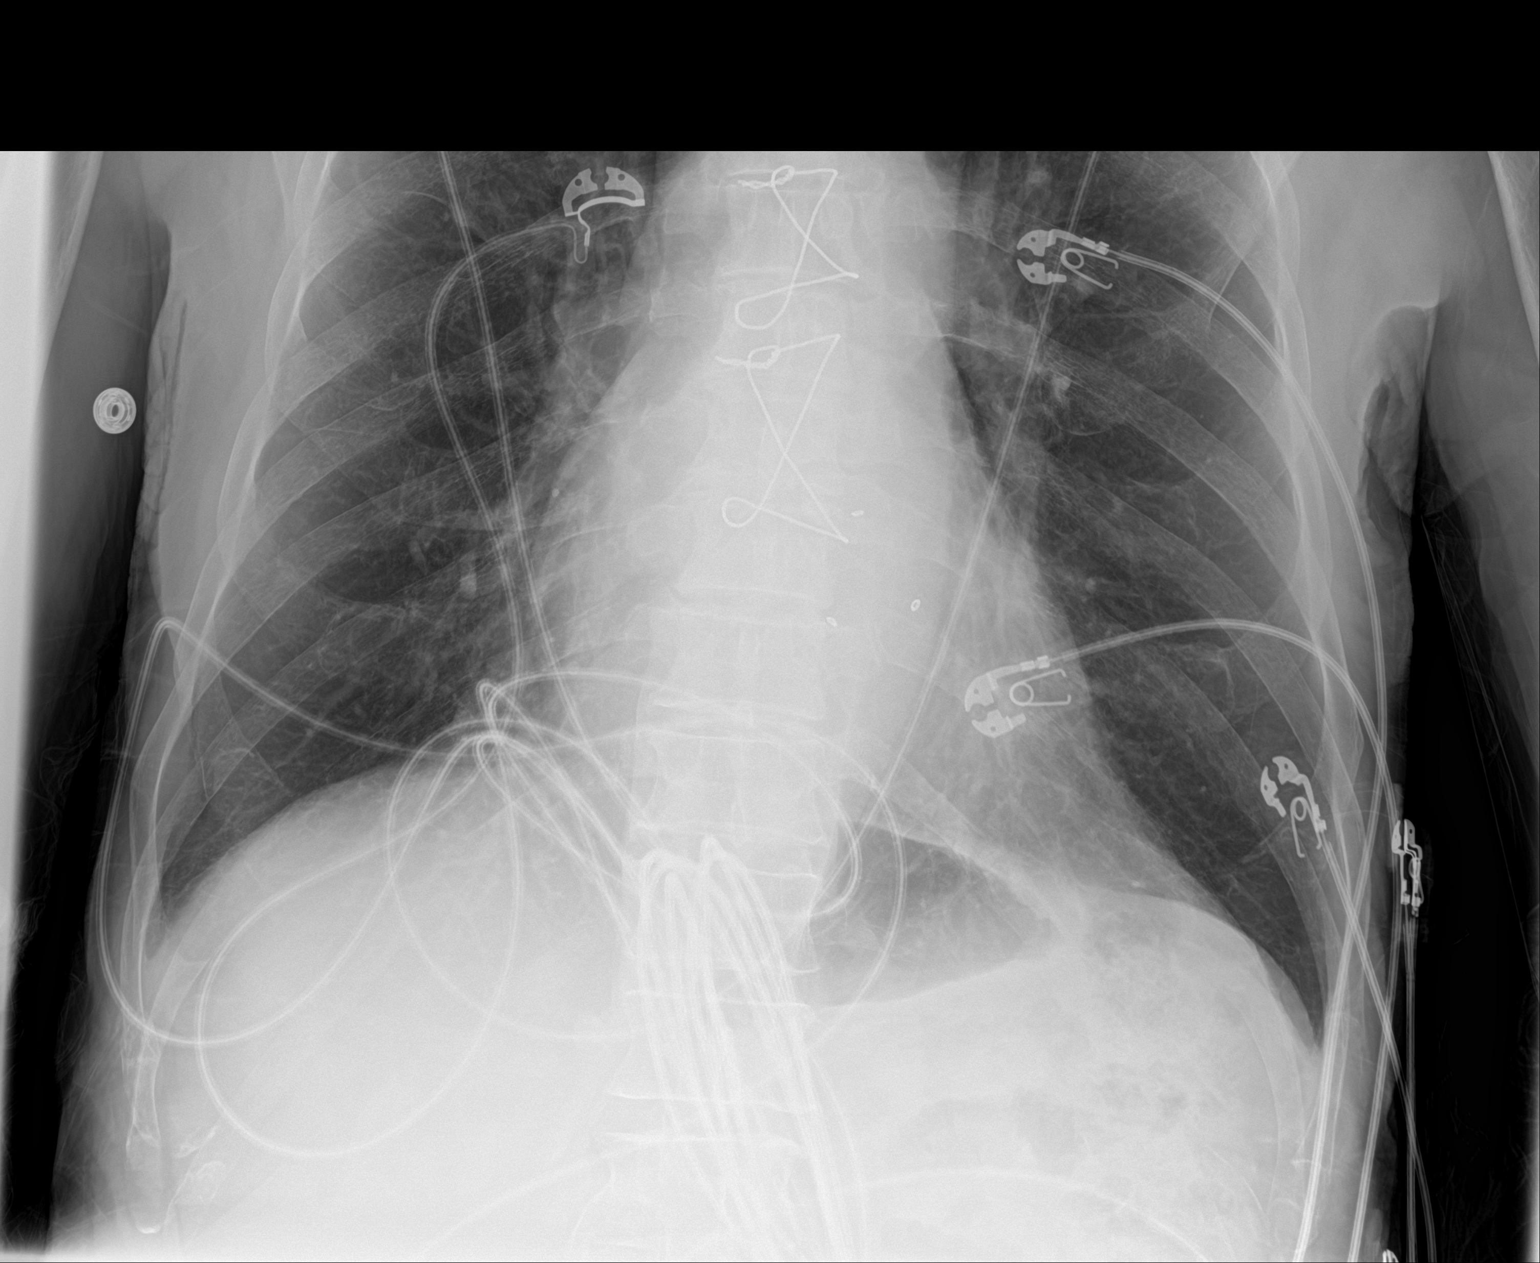

[2 of 2 positions shown; findings below may reference images not displayed]

FINDINGS: Cardiomegaly status post median sternotomy. Both lungs are clear.
The visualized skeletal structures are unremarkable.
IMPRESSION: Cardiomegaly without acute abnormality of the lungs in AP portable
projection.

## 2021-10-09 ENCOUNTER — Inpatient Hospital Stay: Payer: Medicare Other

## 2021-10-09 ENCOUNTER — Ambulatory Visit: Payer: Medicare Other | Admitting: Oncology

## 2021-10-09 ENCOUNTER — Other Ambulatory Visit: Payer: Self-pay | Admitting: *Deleted

## 2021-10-09 ENCOUNTER — Encounter: Payer: Self-pay | Admitting: *Deleted

## 2021-10-09 ENCOUNTER — Other Ambulatory Visit: Payer: Medicare Other

## 2021-10-09 DIAGNOSIS — C187 Malignant neoplasm of sigmoid colon: Secondary | ICD-10-CM | POA: Diagnosis not present

## 2021-10-09 DIAGNOSIS — C9200 Acute myeloblastic leukemia, not having achieved remission: Secondary | ICD-10-CM

## 2021-10-09 LAB — CBC WITH DIFFERENTIAL (CANCER CENTER ONLY)
Abs Immature Granulocytes: 0 10*3/uL (ref 0.00–0.07)
Basophils Absolute: 0 10*3/uL (ref 0.0–0.1)
Basophils Relative: 0 %
Eosinophils Absolute: 0 10*3/uL (ref 0.0–0.5)
Eosinophils Relative: 0 %
HCT: 20.8 % — ABNORMAL LOW (ref 39.0–52.0)
Hemoglobin: 7.2 g/dL — ABNORMAL LOW (ref 13.0–17.0)
Immature Granulocytes: 0 %
Lymphocytes Relative: 87 %
Lymphs Abs: 0.1 10*3/uL — ABNORMAL LOW (ref 0.7–4.0)
MCH: 31 pg (ref 26.0–34.0)
MCHC: 34.6 g/dL (ref 30.0–36.0)
MCV: 89.7 fL (ref 80.0–100.0)
Monocytes Absolute: 0 10*3/uL — ABNORMAL LOW (ref 0.1–1.0)
Monocytes Relative: 0 %
Neutro Abs: 0 10*3/uL — CL (ref 1.7–7.7)
Neutrophils Relative %: 13 %
Platelet Count: 5 10*3/uL — CL (ref 150–400)
RBC: 2.32 MIL/uL — ABNORMAL LOW (ref 4.22–5.81)
RDW: 15.2 % (ref 11.5–15.5)
Smear Review: DECREASED
WBC Count: 0.2 10*3/uL — CL (ref 4.0–10.5)
nRBC: 0 % (ref 0.0–0.2)

## 2021-10-09 LAB — SAMPLE TO BLOOD BANK

## 2021-10-09 MED ORDER — HEPARIN SOD (PORK) LOCK FLUSH 100 UNIT/ML IV SOLN
500.0000 [IU] | Freq: Every day | INTRAVENOUS | Status: AC | PRN
  Administered 2021-10-09: 500 [IU]

## 2021-10-09 MED ORDER — SODIUM CHLORIDE 0.9% FLUSH
10.0000 mL | INTRAVENOUS | Status: AC | PRN
  Administered 2021-10-09: 10 mL

## 2021-10-09 MED ORDER — SODIUM CHLORIDE 0.9% IV SOLUTION
250.0000 mL | Freq: Once | INTRAVENOUS | Status: AC
  Administered 2021-10-09: 250 mL via INTRAVENOUS

## 2021-10-09 NOTE — Progress Notes (Signed)
Called critical labs to [facility]-spoke w/Debbie.  ?

## 2021-10-09 NOTE — Patient Instructions (Signed)
Platelet Transfusion ?A platelet transfusion is a procedure in which a person receives donated platelets through an IV. Platelets are parts of blood that stick together and form a clot to help the body stop bleeding after an injury. If you have too few platelets, your blood may have trouble clotting. This may cause you to bleed and bruise very easily. ?You may need a platelet transfusion if you have a condition that causes a low number of platelets (thrombocytopenia). A platelet transfusion may be used to stop or prevent excessive bleeding. ?Tell a health care provider about: ?Any reactions you have had during previous transfusions. ?Any allergies you have. ?All medicines you are taking, including vitamins, herbs, eye drops, creams, and over-the-counter medicines. ?Any bleeding problems you have. ?Any surgeries you have had. ?Any medical conditions you have. ?Whether you are pregnant or may be pregnant. ?What are the risks? ?Generally, this is a safe procedure. However, problems may occur, including: ?Fever. ?Infection. ?Allergic reaction to the donated (donor) platelets. ?Your body's disease-fighting system (immune system) attacking the donor platelets (hemolytic reaction). This is rare. ?A rare reaction that causes lung damage (transfusion-related acute lung injury). ?What happens before the procedure? ?Medicines ?Ask your health care provider about: ?Changing or stopping your regular medicines. This is especially important if you are taking diabetes medicines or blood thinners. ?Taking medicines such as aspirin and ibuprofen. These medicines can thin your blood. Do not take these medicines unless your health care provider tells you to take them. ?Taking over-the-counter medicines, vitamins, herbs, and supplements. ?General instructions ?You will have a blood test to determine your blood type. Your blood type determines what kind of platelets you will be given. ?Follow instructions from your health care provider  about eating or drinking restrictions. ?If you have had an allergic reaction to a transfusion in the past, you may be given medicine to help prevent a reaction. ?Your temperature, blood pressure, pulse, and breathing will be monitored. ?What happens during the procedure? ? ?An IV will be inserted into one of your veins. ?For your safety, two health care providers will verify your identity along with the donor platelets about to be infused. ?A bag of donor platelets will be connected to your IV. The platelets will flow into your bloodstream. This usually takes 30-60 minutes. ?Your temperature, blood pressure, pulse, and breathing will be monitored during the transfusion. This helps detect early signs of any reaction. ?You will also be monitored for other symptoms that may indicate a reaction, including chills, hives, or itching. ?If you have signs of a reaction at any time, your transfusion will be stopped, and you may be given medicine to help manage the reaction. ?When your transfusion is complete, your IV will be removed. ?Pressure may be applied to the IV site for a few minutes to stop any bleeding. ?The IV site will be covered with a bandage (dressing). ?The procedure may vary among health care providers and hospitals. ?What can I expect after the procedure? ?Your blood pressure, temperature, pulse, and breathing will be monitored until you leave the hospital or clinic. ?You may have some bruising and soreness at your IV site. ?Follow these instructions at home: ?Medicines ?Take over-the-counter and prescription medicines only as told by your health care provider. ?Talk with your health care provider before you take any medicines that contain aspirin or NSAIDs, such as ibuprofen. These medicines increase your risk for dangerous bleeding. ?IV site care ?Check your IV site every day for signs of infection. Check for: ?  Redness, swelling, or pain. ?Fluid or blood. If fluid or blood drains from your IV site, use your  hands to press down firmly on a bandage covering the area for a minute or two. Doing this should stop the bleeding. ?Warmth. ?Pus or a bad smell. ?General instructions ?Change or remove your dressing as told by your health care provider. ?Return to your normal activities as told by your health care provider. Ask your health care provider what activities are safe for you. ?Do not take baths, swim, or use a hot tub until your health care provider approves. Ask your health care provider if you may take showers. ?Keep all follow-up visits. This is important. ?Contact a health care provider if: ?You have a headache that does not go away with medicine. ?You have hives, rash, or itchy skin. ?You have nausea or vomiting. ?You feel unusually tired or weak. ?You have signs of infection at your IV site. ?Get help right away if: ?You have a fever or chills. ?You urinate less often than usual. ?Your urine is darker colored than normal. ?You have any of the following: ?Trouble breathing. ?Pain in your back, abdomen, or chest. ?Cool, clammy skin. ?A fast heartbeat. ?Summary ?Platelets are tiny pieces of blood cells that clump together to form a blood clot when you have an injury. If you have too few platelets, your blood may have trouble clotting. ?A platelet transfusion is a procedure in which you receive donated platelets through an IV. ?A platelet transfusion may be used to stop or prevent excessive bleeding. ?After the procedure, check your IV site every day for signs of infection. ?This information is not intended to replace advice given to you by your health care provider. Make sure you discuss any questions you have with your health care provider. ?Document Revised: 11/21/2020 Document Reviewed: 11/21/2020 ?Elsevier Patient Education ? [place]. ? ?Blood Transfusion, Adult, Care After ?This sheet gives you information about how to care for yourself after your procedure. Your doctor may also give you more specific  instructions. If you have problems or questions, contact your doctor. ?What can I expect after the procedure? ?After the procedure, it is common to have: ?Bruising and soreness at the IV site. ?A headache. ?Follow these instructions at home: ?Insertion site care ? ?  ? ?Follow instructions from your doctor about how to take care of your insertion site. This is where an IV tube was put into your vein. Make sure you: ?Wash your hands with soap and water before and after you change your bandage (dressing). If you cannot use soap and water, use hand sanitizer. ?Change your bandage as told by your doctor. ?Check your insertion site every day for signs of infection. Check for: ?Redness, swelling, or pain. ?Bleeding from the site. ?Warmth. ?Pus or a bad smell. ?General instructions ?Take over-the-counter and prescription medicines only as told by your doctor. ?Rest as told by your doctor. ?Go back to your normal activities as told by your doctor. ?Keep all follow-up visits as told by your doctor. This is important. ?Contact a doctor if: ?You have itching or red, swollen areas of skin (hives). ?You feel worried or nervous (anxious). ?You feel weak after doing your normal activities. ?You have redness, swelling, warmth, or pain around the insertion site. ?You have blood coming from the insertion site, and the blood does not stop with pressure. ?You have pus or a bad smell coming from the insertion site. ?Get help right away if: ?You have signs  of a serious reaction. This may be coming from an allergy or the body's defense system (immune system). Signs include: ?Trouble breathing or shortness of breath. ?Swelling of the face or feeling warm (flushed). ?Fever or chills. ?Head, chest, or back pain. ?Dark pee (urine) or blood in the pee. ?Widespread rash. ?Fast heartbeat. ?Feeling dizzy or light-headed. ?You may receive your blood transfusion in an outpatient setting. If so, you will be told whom to contact to report any  reactions. ?These symptoms may be an emergency. Do not wait to see if the symptoms will go away. Get medical help right away. Call your local emergency services (911 in the [place] Do not drive yourself to the

## 2021-10-09 NOTE — Progress Notes (Signed)
CRITICAL VALUE STICKER ? ?CRITICAL VALUE: WBC 0.2 ?                                ANC 0.0 ?          Platelet 5,000 ? ?RECEIVER (on-site recipient of call):Adaria Hole,RN ? ?DATE & TIME NOTIFIED: 10/09/21 @ 0915 ? ?MESSENGER (representative from lab):Simona Huh ? ?MD NOTIFIED: Dr. Benay Spice ? ?TIME OF NOTIFICATION:0920 ? ?RESPONSE: Transfusing platelets, 2 units blood today. Continue antibiotics. Follow up on 5/15 as scheduled. ? ?

## 2021-10-10 LAB — TYPE AND SCREEN
ABO/RH(D): AB POS
Antibody Screen: NEGATIVE
Unit division: 0
Unit division: 0

## 2021-10-10 LAB — BPAM RBC
Blood Product Expiration Date: 202305232359
Blood Product Expiration Date: 202305242359
ISSUE DATE / TIME: 202305110740
ISSUE DATE / TIME: 202305110740
Unit Type and Rh: 6200
Unit Type and Rh: 6200

## 2021-10-10 LAB — PREPARE PLATELET PHERESIS: Unit division: 0

## 2021-10-10 LAB — BPAM PLATELET PHERESIS
Blood Product Expiration Date: 202305112359
ISSUE DATE / TIME: 202305110737
Unit Type and Rh: 6200

## 2021-10-13 ENCOUNTER — Inpatient Hospital Stay: Payer: Medicare Other

## 2021-10-13 ENCOUNTER — Other Ambulatory Visit: Payer: Self-pay | Admitting: *Deleted

## 2021-10-13 ENCOUNTER — Encounter: Payer: Self-pay | Admitting: *Deleted

## 2021-10-13 DIAGNOSIS — D696 Thrombocytopenia, unspecified: Secondary | ICD-10-CM

## 2021-10-13 DIAGNOSIS — C9200 Acute myeloblastic leukemia, not having achieved remission: Secondary | ICD-10-CM

## 2021-10-13 DIAGNOSIS — C187 Malignant neoplasm of sigmoid colon: Secondary | ICD-10-CM | POA: Diagnosis not present

## 2021-10-13 LAB — CMP (CANCER CENTER ONLY)
ALT: 82 U/L — ABNORMAL HIGH (ref 0–44)
AST: 45 U/L — ABNORMAL HIGH (ref 15–41)
Albumin: 3.2 g/dL — ABNORMAL LOW (ref 3.5–5.0)
Alkaline Phosphatase: 132 U/L — ABNORMAL HIGH (ref 38–126)
Anion gap: 10 (ref 5–15)
BUN: 32 mg/dL — ABNORMAL HIGH (ref 8–23)
CO2: 22 mmol/L (ref 22–32)
Calcium: 8.8 mg/dL — ABNORMAL LOW (ref 8.9–10.3)
Chloride: 107 mmol/L (ref 98–111)
Creatinine: 1.24 mg/dL (ref 0.61–1.24)
GFR, Estimated: 60 mL/min — ABNORMAL LOW (ref >60)
Glucose, Bld: 141 mg/dL — ABNORMAL HIGH (ref 70–99)
Potassium: 3.4 mmol/L — ABNORMAL LOW (ref 3.5–5.1)
Sodium: 139 mmol/L (ref 135–145)
Total Bilirubin: 3.4 mg/dL — ABNORMAL HIGH (ref 0.3–1.2)
Total Protein: 7.2 g/dL (ref 6.5–8.1)

## 2021-10-13 LAB — SAMPLE TO BLOOD BANK

## 2021-10-13 LAB — CBC WITH DIFFERENTIAL (CANCER CENTER ONLY)
Abs Immature Granulocytes: 0 10*3/uL (ref 0.00–0.07)
Basophils Absolute: 0 10*3/uL (ref 0.0–0.1)
Basophils Relative: 0 %
Eosinophils Absolute: 0 10*3/uL (ref 0.0–0.5)
Eosinophils Relative: 0 %
HCT: 25.6 % — ABNORMAL LOW (ref 39.0–52.0)
Hemoglobin: 8.7 g/dL — ABNORMAL LOW (ref 13.0–17.0)
Lymphocytes Relative: 77 %
Lymphs Abs: 0.2 10*3/uL — ABNORMAL LOW (ref 0.7–4.0)
MCH: 30.4 pg (ref 26.0–34.0)
MCHC: 34 g/dL (ref 30.0–36.0)
MCV: 89.5 fL (ref 80.0–100.0)
Monocytes Absolute: 0 10*3/uL — ABNORMAL LOW (ref 0.1–1.0)
Monocytes Relative: 5 %
Neutro Abs: 0.1 10*3/uL — CL (ref 1.7–7.7)
Neutrophils Relative %: 18 %
Platelet Count: 5 10*3/uL — CL (ref 150–400)
RBC: 2.86 MIL/uL — ABNORMAL LOW (ref 4.22–5.81)
RDW: 14.6 % (ref 11.5–15.5)
WBC Count: 0.2 10*3/uL — CL (ref 4.0–10.5)
nRBC: 0 % (ref 0.0–0.2)

## 2021-10-13 LAB — MAGNESIUM: Magnesium: 2 mg/dL (ref 1.7–2.4)

## 2021-10-13 MED ORDER — SODIUM CHLORIDE 0.9% FLUSH
10.0000 mL | INTRAVENOUS | Status: AC | PRN
  Administered 2021-10-13: 10 mL

## 2021-10-13 MED ORDER — HEPARIN SOD (PORK) LOCK FLUSH 100 UNIT/ML IV SOLN
250.0000 [IU] | INTRAVENOUS | Status: AC | PRN
  Administered 2021-10-13: 250 [IU]

## 2021-10-13 MED ORDER — SODIUM CHLORIDE 0.9% IV SOLUTION
250.0000 mL | Freq: Once | INTRAVENOUS | Status: AC
  Administered 2021-10-13: 250 mL via INTRAVENOUS

## 2021-10-13 NOTE — Progress Notes (Signed)
CRITICAL VALUE STICKER ? ?CRITICAL VALUE: WBC 0.2; ANC 0.1; platelet 5,000 ? ?RECEIVER (on-site recipient of call):Raymond Azure,RN ? ?DATE & TIME NOTIFIED: 10/13/21 @ 1220 ? ?MESSENGER (representative from lab): Sunny ? ?MD NOTIFIED: Dr. Benay Spice ? ?TIME OF NOTIFICATION: 1225 ? ?RESPONSE: Transfuse 1 unit platelets today. ?Continue po antibiotics. ? ?

## 2021-10-13 NOTE — Progress Notes (Signed)
Platelet orders entered. ?

## 2021-10-13 NOTE — Progress Notes (Signed)
Faxed 5/15 labs to [facility] at [phone] and critical results called to triage nurse, Debbie. ?Receiving 1 unit platelets today. Patient reminded to continue antibiotic, call for fever and keep his blood bank bracelet on for Thursday am in case he needs blood transfusion. ?

## 2021-10-13 NOTE — Patient Instructions (Signed)
Platelet Transfusion A platelet transfusion is a procedure in which a person receives donated platelets through an IV. Platelets are parts of blood that stick together and form a clot to help the body stop bleeding after an injury. If you have too few platelets, your blood may have trouble clotting. This may cause you to bleed and bruise very easily. You may need a platelet transfusion if you have a condition that causes a low number of platelets (thrombocytopenia). A platelet transfusion may be used to stop or prevent excessive bleeding. Tell a health care provider about: Any reactions you have had during previous transfusions. Any allergies you have. All medicines you are taking, including vitamins, herbs, eye drops, creams, and over-the-counter medicines. Any bleeding problems you have. Any surgeries you have had. Any medical conditions you have. Whether you are pregnant or may be pregnant. What are the risks? Generally, this is a safe procedure. However, problems may occur, including: Fever. Infection. Allergic reaction to the donated (donor) platelets. Your body's disease-fighting system (immune system) attacking the donor platelets (hemolytic reaction). This is rare. A rare reaction that causes lung damage (transfusion-related acute lung injury). What happens before the procedure? Medicines Ask your health care provider about: Changing or stopping your regular medicines. This is especially important if you are taking diabetes medicines or blood thinners. Taking medicines such as aspirin and ibuprofen. These medicines can thin your blood. Do not take these medicines unless your health care provider tells you to take them. Taking over-the-counter medicines, vitamins, herbs, and supplements. General instructions You will have a blood test to determine your blood type. Your blood type determines what kind of platelets you will be given. Follow instructions from your health care provider  about eating or drinking restrictions. If you have had an allergic reaction to a transfusion in the past, you may be given medicine to help prevent a reaction. Your temperature, blood pressure, pulse, and breathing will be monitored. What happens during the procedure?  An IV will be inserted into one of your veins. For your safety, two health care providers will verify your identity along with the donor platelets about to be infused. A bag of donor platelets will be connected to your IV. The platelets will flow into your bloodstream. This usually takes 30-60 minutes. Your temperature, blood pressure, pulse, and breathing will be monitored during the transfusion. This helps detect early signs of any reaction. You will also be monitored for other symptoms that may indicate a reaction, including chills, hives, or itching. If you have signs of a reaction at any time, your transfusion will be stopped, and you may be given medicine to help manage the reaction. When your transfusion is complete, your IV will be removed. Pressure may be applied to the IV site for a few minutes to stop any bleeding. The IV site will be covered with a bandage (dressing). The procedure may vary among health care providers and hospitals. What can I expect after the procedure? Your blood pressure, temperature, pulse, and breathing will be monitored until you leave the hospital or clinic. You may have some bruising and soreness at your IV site. Follow these instructions at home: Medicines Take over-the-counter and prescription medicines only as told by your health care provider. Talk with your health care provider before you take any medicines that contain aspirin or NSAIDs, such as ibuprofen. These medicines increase your risk for dangerous bleeding. IV site care Check your IV site every day for signs of infection. Check for:   Redness, swelling, or pain. Fluid or blood. If fluid or blood drains from your IV site, use your  hands to press down firmly on a bandage covering the area for a minute or two. Doing this should stop the bleeding. Warmth. Pus or a bad smell. General instructions Change or remove your dressing as told by your health care provider. Return to your normal activities as told by your health care provider. Ask your health care provider what activities are safe for you. Do not take baths, swim, or use a hot tub until your health care provider approves. Ask your health care provider if you may take showers. Keep all follow-up visits. This is important. Contact a health care provider if: You have a headache that does not go away with medicine. You have hives, rash, or itchy skin. You have nausea or vomiting. You feel unusually tired or weak. You have signs of infection at your IV site. Get help right away if: You have a fever or chills. You urinate less often than usual. Your urine is darker colored than normal. You have any of the following: Trouble breathing. Pain in your back, abdomen, or chest. Cool, clammy skin. A fast heartbeat. Summary Platelets are tiny pieces of blood cells that clump together to form a blood clot when you have an injury. If you have too few platelets, your blood may have trouble clotting. A platelet transfusion is a procedure in which you receive donated platelets through an IV. A platelet transfusion may be used to stop or prevent excessive bleeding. After the procedure, check your IV site every day for signs of infection. This information is not intended to replace advice given to you by your health care provider. Make sure you discuss any questions you have with your health care provider. Document Revised: 11/21/2020 Document Reviewed: 11/21/2020 Elsevier Patient Education  2023 Elsevier Inc.  

## 2021-10-14 LAB — BPAM PLATELET PHERESIS
Blood Product Expiration Date: 202305182359
ISSUE DATE / TIME: 202305151240
Unit Type and Rh: 6200

## 2021-10-14 LAB — PREPARE PLATELET PHERESIS: Unit division: 0

## 2021-10-15 ENCOUNTER — Other Ambulatory Visit: Payer: Self-pay | Admitting: *Deleted

## 2021-10-15 DIAGNOSIS — C9200 Acute myeloblastic leukemia, not having achieved remission: Secondary | ICD-10-CM

## 2021-10-15 DIAGNOSIS — D696 Thrombocytopenia, unspecified: Secondary | ICD-10-CM

## 2021-10-15 NOTE — Progress Notes (Addendum)
Platelet orders placed for infusion tomorrow. ?Faxed ticket to blood bank to have platelets ready for 0800 infusion. [facility] will pick up platelets at 0715.  ?Requested type and screen on 10/13/21 sample drawn at 1130 in case Hgb meets parameters to transfuse on 5/18.  ?

## 2021-10-16 ENCOUNTER — Other Ambulatory Visit: Payer: Self-pay

## 2021-10-16 ENCOUNTER — Inpatient Hospital Stay: Payer: Medicare Other

## 2021-10-16 ENCOUNTER — Emergency Department (HOSPITAL_BASED_OUTPATIENT_CLINIC_OR_DEPARTMENT_OTHER)
Admission: EM | Admit: 2021-10-16 | Discharge: 2021-10-16 | Disposition: A | Discharge status: Other Acute Inpatient Hospital | Payer: Medicare Other | Attending: Emergency Medicine | Admitting: Emergency Medicine

## 2021-10-16 ENCOUNTER — Encounter (HOSPITAL_BASED_OUTPATIENT_CLINIC_OR_DEPARTMENT_OTHER): Payer: Self-pay | Admitting: Emergency Medicine

## 2021-10-16 ENCOUNTER — Telehealth: Payer: Self-pay

## 2021-10-16 ENCOUNTER — Emergency Department (HOSPITAL_BASED_OUTPATIENT_CLINIC_OR_DEPARTMENT_OTHER): Payer: Medicare Other

## 2021-10-16 ENCOUNTER — Inpatient Hospital Stay (HOSPITAL_BASED_OUTPATIENT_CLINIC_OR_DEPARTMENT_OTHER): Payer: Medicare Other | Admitting: Nurse Practitioner

## 2021-10-16 DIAGNOSIS — D696 Thrombocytopenia, unspecified: Secondary | ICD-10-CM

## 2021-10-16 DIAGNOSIS — C9200 Acute myeloblastic leukemia, not having achieved remission: Secondary | ICD-10-CM

## 2021-10-16 DIAGNOSIS — D61818 Other pancytopenia: Secondary | ICD-10-CM | POA: Diagnosis not present

## 2021-10-16 DIAGNOSIS — I4891 Unspecified atrial fibrillation: Secondary | ICD-10-CM | POA: Diagnosis not present

## 2021-10-16 DIAGNOSIS — C187 Malignant neoplasm of sigmoid colon: Secondary | ICD-10-CM | POA: Diagnosis not present

## 2021-10-16 DIAGNOSIS — A419 Sepsis, unspecified organism: Secondary | ICD-10-CM | POA: Diagnosis present

## 2021-10-16 DIAGNOSIS — R7989 Other specified abnormal findings of blood chemistry: Secondary | ICD-10-CM

## 2021-10-16 DIAGNOSIS — Z20822 Contact with and (suspected) exposure to covid-19: Secondary | ICD-10-CM | POA: Diagnosis not present

## 2021-10-16 DIAGNOSIS — R7401 Elevation of levels of liver transaminase levels: Secondary | ICD-10-CM | POA: Insufficient documentation

## 2021-10-16 HISTORY — DX: Leukemia, unspecified not having achieved remission: C95.90

## 2021-10-16 LAB — CBC WITH DIFFERENTIAL (CANCER CENTER ONLY)
Abs Immature Granulocytes: 0.02 10*3/uL (ref 0.00–0.07)
Basophils Absolute: 0 10*3/uL (ref 0.0–0.1)
Basophils Relative: 0 %
Eosinophils Absolute: 0 10*3/uL (ref 0.0–0.5)
Eosinophils Relative: 0 %
HCT: 22.7 % — ABNORMAL LOW (ref 39.0–52.0)
Hemoglobin: 7.5 g/dL — ABNORMAL LOW (ref 13.0–17.0)
Immature Granulocytes: 12 %
Lymphocytes Relative: 47 %
Lymphs Abs: 0.1 10*3/uL — ABNORMAL LOW (ref 0.7–4.0)
MCH: 29.6 pg (ref 26.0–34.0)
MCHC: 33 g/dL (ref 30.0–36.0)
MCV: 89.7 fL (ref 80.0–100.0)
Monocytes Absolute: 0 10*3/uL — ABNORMAL LOW (ref 0.1–1.0)
Monocytes Relative: 0 %
Neutro Abs: 0.1 10*3/uL — CL (ref 1.7–7.7)
Neutrophils Relative %: 41 %
Platelet Count: 6 10*3/uL — CL (ref 150–400)
RBC: 2.53 MIL/uL — ABNORMAL LOW (ref 4.22–5.81)
RDW: 15.2 % (ref 11.5–15.5)
Smear Review: DECREASED
WBC Count: 0.2 10*3/uL — CL (ref 4.0–10.5)
nRBC: 0 % (ref 0.0–0.2)

## 2021-10-16 LAB — URINALYSIS, ROUTINE W REFLEX MICROSCOPIC
Glucose, UA: NEGATIVE mg/dL
Ketones, ur: NEGATIVE mg/dL
Leukocytes,Ua: NEGATIVE
Nitrite: NEGATIVE
Protein, ur: >300 mg/dL — AB
Specific Gravity, Urine: 1.025 (ref 1.005–1.030)
pH: 6.5 (ref 5.0–8.0)

## 2021-10-16 LAB — CMP (CANCER CENTER ONLY)
ALT: 302 U/L (ref 0–44)
AST: 233 U/L (ref 15–41)
Albumin: 2.9 g/dL — ABNORMAL LOW (ref 3.5–5.0)
Alkaline Phosphatase: 118 U/L (ref 38–126)
Anion gap: 10 (ref 5–15)
BUN: 45 mg/dL — ABNORMAL HIGH (ref 8–23)
CO2: 23 mmol/L (ref 22–32)
Calcium: 8.3 mg/dL — ABNORMAL LOW (ref 8.9–10.3)
Chloride: 108 mmol/L (ref 98–111)
Creatinine: 1.63 mg/dL — ABNORMAL HIGH (ref 0.61–1.24)
GFR, Estimated: 43 mL/min — ABNORMAL LOW (ref >60)
Glucose, Bld: 134 mg/dL — ABNORMAL HIGH (ref 70–99)
Potassium: 3.2 mmol/L — ABNORMAL LOW (ref 3.5–5.1)
Sodium: 141 mmol/L (ref 135–145)
Total Bilirubin: 5.3 mg/dL (ref 0.3–1.2)
Total Protein: 6.6 g/dL (ref 6.5–8.1)

## 2021-10-16 LAB — RESP PANEL BY RT-PCR (FLU A&B, COVID) ARPGX2
Influenza A by PCR: NEGATIVE
Influenza B by PCR: NEGATIVE
SARS Coronavirus 2 by RT PCR: NEGATIVE

## 2021-10-16 LAB — HEPATITIS PANEL, ACUTE
HCV Ab: NONREACTIVE
Hep A IgM: NONREACTIVE
Hep B C IgM: NONREACTIVE
Hepatitis B Surface Ag: NONREACTIVE

## 2021-10-16 LAB — DIGOXIN LEVEL: Digoxin Level: 0.2 ng/mL — ABNORMAL LOW (ref 0.8–2.0)

## 2021-10-16 LAB — SAMPLE TO BLOOD BANK

## 2021-10-16 LAB — PROTIME-INR
INR: 1.5 — ABNORMAL HIGH (ref 0.8–1.2)
Prothrombin Time: 18.1 seconds — ABNORMAL HIGH (ref 11.4–15.2)

## 2021-10-16 LAB — APTT: aPTT: 39 seconds — ABNORMAL HIGH (ref 24–36)

## 2021-10-16 LAB — LACTIC ACID, PLASMA
Lactic Acid, Venous: 2.2 mmol/L (ref 0.5–1.9)
Lactic Acid, Venous: 1.8 mmol/L (ref 0.5–1.9)

## 2021-10-16 LAB — PREPARE RBC (CROSSMATCH)

## 2021-10-16 MED ORDER — SODIUM CHLORIDE 0.9 % IV SOLN: 2.0000 g | Freq: Two times a day (BID) | INTRAVENOUS | Status: DC

## 2021-10-16 MED ORDER — METOPROLOL TARTRATE 5 MG/5ML IV SOLN
5.0000 mg | Freq: Once | INTRAVENOUS | Status: AC
Start: 2021-10-16 — End: 2021-10-16
  Administered 2021-10-16: 5 mg via INTRAVENOUS
  Filled 2021-10-16: qty 5

## 2021-10-16 MED ORDER — VANCOMYCIN HCL IN DEXTROSE 1-5 GM/200ML-% IV SOLN
1000.0000 mg | Freq: Once | INTRAVENOUS | Status: AC
  Administered 2021-10-16: 1000 mg via INTRAVENOUS
  Filled 2021-10-16: qty 200

## 2021-10-16 MED ORDER — METOPROLOL TARTRATE 5 MG/5ML IV SOLN
5.0000 mg | Freq: Once | INTRAVENOUS | Status: AC
  Administered 2021-10-16: 5 mg via INTRAVENOUS
  Filled 2021-10-16: qty 5

## 2021-10-16 MED ORDER — METRONIDAZOLE 500 MG/100ML IV SOLN
500.0000 mg | Freq: Once | INTRAVENOUS | Status: AC
  Administered 2021-10-16: 500 mg via INTRAVENOUS
  Filled 2021-10-16: qty 100

## 2021-10-16 MED ORDER — SODIUM CHLORIDE 0.9 % IV SOLN
2.0000 g | Freq: Once | INTRAVENOUS | Status: AC
  Administered 2021-10-16: 2 g via INTRAVENOUS
  Filled 2021-10-16: qty 12.5

## 2021-10-16 MED ORDER — SODIUM CHLORIDE 0.9 % IV SOLN: Freq: Once | INTRAVENOUS | Status: AC

## 2021-10-16 MED ORDER — SODIUM CHLORIDE 0.9 % IV BOLUS
500.0000 mL | Freq: Once | INTRAVENOUS | Status: AC
  Administered 2021-10-16: 500 mL via INTRAVENOUS

## 2021-10-16 MED ORDER — LACTATED RINGERS IV SOLN: INTRAVENOUS | Status: DC

## 2021-10-16 MED ORDER — VANCOMYCIN HCL 1750 MG/350ML IV SOLN
1750.0000 mg | INTRAVENOUS | Status: DC
  Filled 2021-10-16: qty 350

## 2021-10-16 MED ORDER — SODIUM CHLORIDE 0.9% IV SOLUTION
250.0000 mL | Freq: Once | INTRAVENOUS | Status: AC
  Administered 2021-10-16: 250 mL via INTRAVENOUS

## 2021-10-16 NOTE — ED Notes (Signed)
Sheets and colostomy bag changed d/t leaking.  Lg amt of liquid stool noted

## 2021-10-16 NOTE — Progress Notes (Signed)
Lab results faxed to [facility] to [phone]. Today he is receiving 1 unit platelets and 2 u PRBCs.

## 2021-10-16 NOTE — Patient Instructions (Addendum)
Platelet Transfusion A platelet transfusion is a procedure in which a person receives donated platelets through an IV. Platelets are parts of blood that stick together and form a clot to help the body stop bleeding after an injury. If you have too few platelets, your blood may have trouble clotting. This may cause you to bleed and bruise very easily. You may need a platelet transfusion if you have a condition that causes a low number of platelets (thrombocytopenia). A platelet transfusion may be used to stop or prevent excessive bleeding. Tell a health care provider about: Any reactions you have had during previous transfusions. Any allergies you have. All medicines you are taking, including vitamins, herbs, eye drops, creams, and over-the-counter medicines. Any bleeding problems you have. Any surgeries you have had. Any medical conditions you have. Whether you are pregnant or may be pregnant. What are the risks? Generally, this is a safe procedure. However, problems may occur, including: Fever. Infection. Allergic reaction to the donated (donor) platelets. Your body's disease-fighting system (immune system) attacking the donor platelets (hemolytic reaction). This is rare. A rare reaction that causes lung damage (transfusion-related acute lung injury). What happens before the procedure? Medicines Ask your health care provider about: Changing or stopping your regular medicines. This is especially important if you are taking diabetes medicines or blood thinners. Taking medicines such as aspirin and ibuprofen. These medicines can thin your blood. Do not take these medicines unless your health care provider tells you to take them. Taking over-the-counter medicines, vitamins, herbs, and supplements. General instructions You will have a blood test to determine your blood type. Your blood type determines what kind of platelets you will be given. Follow instructions from your health care provider  about eating or drinking restrictions. If you have had an allergic reaction to a transfusion in the past, you may be given medicine to help prevent a reaction. Your temperature, blood pressure, pulse, and breathing will be monitored. What happens during the procedure?  An IV will be inserted into one of your veins. For your safety, two health care providers will verify your identity along with the donor platelets about to be infused. A bag of donor platelets will be connected to your IV. The platelets will flow into your bloodstream. This usually takes 30-60 minutes. Your temperature, blood pressure, pulse, and breathing will be monitored during the transfusion. This helps detect early signs of any reaction. You will also be monitored for other symptoms that may indicate a reaction, including chills, hives, or itching. If you have signs of a reaction at any time, your transfusion will be stopped, and you may be given medicine to help manage the reaction. When your transfusion is complete, your IV will be removed. Pressure may be applied to the IV site for a few minutes to stop any bleeding. The IV site will be covered with a bandage (dressing). The procedure may vary among health care providers and hospitals. What can I expect after the procedure? Your blood pressure, temperature, pulse, and breathing will be monitored until you leave the hospital or clinic. You may have some bruising and soreness at your IV site. Follow these instructions at home: Medicines Take over-the-counter and prescription medicines only as told by your health care provider. Talk with your health care provider before you take any medicines that contain aspirin or NSAIDs, such as ibuprofen. These medicines increase your risk for dangerous bleeding. IV site care Check your IV site every day for signs of infection. Check for:  Redness, swelling, or pain. Fluid or blood. If fluid or blood drains from your IV site, use your  hands to press down firmly on a bandage covering the area for a minute or two. Doing this should stop the bleeding. Warmth. Pus or a bad smell. General instructions Change or remove your dressing as told by your health care provider. Return to your normal activities as told by your health care provider. Ask your health care provider what activities are safe for you. Do not take baths, swim, or use a hot tub until your health care provider approves. Ask your health care provider if you may take showers. Keep all follow-up visits. This is important. Contact a health care provider if: You have a headache that does not go away with medicine. You have hives, rash, or itchy skin. You have nausea or vomiting. You feel unusually tired or weak. You have signs of infection at your IV site. Get help right away if: You have a fever or chills. You urinate less often than usual. Your urine is darker colored than normal. You have any of the following: Trouble breathing. Pain in your back, abdomen, or chest. Cool, clammy skin. A fast heartbeat. Summary Platelets are tiny pieces of blood cells that clump together to form a blood clot when you have an injury. If you have too few platelets, your blood may have trouble clotting. A platelet transfusion is a procedure in which you receive donated platelets through an IV. A platelet transfusion may be used to stop or prevent excessive bleeding. After the procedure, check your IV site every day for signs of infection. This information is not intended to replace advice given to you by your health care provider. Make sure you discuss any questions you have with your health care provider. Document Revised: 11/21/2020 Document Reviewed: 11/21/2020 Elsevier Patient Education  [place].  Blood Transfusion, Adult, Care After This sheet gives you information about how to care for yourself after your procedure. Your doctor may also give you more specific  instructions. If you have problems or questions, contact your doctor. What can I expect after the procedure? After the procedure, it is common to have: Bruising and soreness at the IV site. A headache. Follow these instructions at home: Insertion site care     Follow instructions from your doctor about how to take care of your insertion site. This is where an IV tube was put into your vein. Make sure you: Wash your hands with soap and water before and after you change your bandage (dressing). If you cannot use soap and water, use hand sanitizer. Change your bandage as told by your doctor. Check your insertion site every day for signs of infection. Check for: Redness, swelling, or pain. Bleeding from the site. Warmth. Pus or a bad smell. General instructions Take over-the-counter and prescription medicines only as told by your doctor. Rest as told by your doctor. Go back to your normal activities as told by your doctor. Keep all follow-up visits as told by your doctor. This is important. Contact a doctor if: You have itching or red, swollen areas of skin (hives). You feel worried or nervous (anxious). You feel weak after doing your normal activities. You have redness, swelling, warmth, or pain around the insertion site. You have blood coming from the insertion site, and the blood does not stop with pressure. You have pus or a bad smell coming from the insertion site. Get help right away if: You have signs  of a serious reaction. This may be coming from an allergy or the body's defense system (immune system). Signs include: Trouble breathing or shortness of breath. Swelling of the face or feeling warm (flushed). Fever or chills. Head, chest, or back pain. Dark pee (urine) or blood in the pee. Widespread rash. Fast heartbeat. Feeling dizzy or light-headed. You may receive your blood transfusion in an outpatient setting. If so, you will be told whom to contact to report any  reactions. These symptoms may be an emergency. Do not wait to see if the symptoms will go away. Get medical help right away. Call your local emergency services (911 in the [place] Do not drive yourself to the hospital. Summary Bruising and soreness at the IV site are common. Check your insertion site every day for signs of infection. Rest as told by your doctor. Go back to your normal activities as told by your doctor. Get help right away if you have signs of a serious reaction. This information is not intended to replace advice given to you by your health care provider. Make sure you discuss any questions you have with your health care provider. Document Revised: 09/12/2020 Document Reviewed: 11/10/2018 Elsevier Patient Education  [place].

## 2021-10-16 NOTE — Progress Notes (Signed)
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  He continues venetoclax.  No fever or bleeding.  He continues to note bruising.  He has sores along the lower lip.  Objective:  Vital signs in last 24 hours:  Temperature 98.4, heart rate 129, blood pressure 90/57    HEENT: Crusted lesions along the lower lip.  White coating over the tongue. Cardio: Regular, tachycardic. GI: Left lower quadrant colostomy.  No hepatosplenomegaly. Vascular: No leg edema. Neuro: Alert and oriented. Skin: Ecchymoses over the forearms and lower legs.  Ecchymosis left chin region.  He appears jaundiced. Right upper extremity PICC without erythema.   Lab Results:  Lab Results  Component Value Date   WBC 0.2 (LL) 10/16/2021   HGB 7.5 (L) 10/16/2021   HCT 22.7 (L) 10/16/2021   MCV 89.7 10/16/2021   PLT 6 (LL) 10/16/2021   NEUTROABS 0.1 (LL) 10/16/2021    Imaging:  No results found.  Medications: I have reviewed the patient's current medications.  Assessment/Plan: Sigmoid colon adenocarcinoma -Colonoscopy 10/21/2020- partially obstructing mass found in the sigmoid colon consistent with adenocarcinoma. -CEA on 10/22/2020 was 3.9 -CTs 10/22/2020-colonic mass with pelvic lymphadenopathy including right common iliac and external iliac nodes, small pulmonary nodules -10/24/2020-sigmoid colectomy, end colostomy, tumor stuck to pelvic sidewall with rind of tissue remaining at completion of surgery -Pathology- moderately differentiated adenocarcinoma the sigmoid colon, tumor extends into pericolonic connective tissue, no lymphovascular perineural invasion, 0/14 lymph nodes, carcinoma involves an area with the specimen was disrupted at mesenteric margin; mismatch repair protein (IHC) normal; MSI stable -CTs 11/24/2020-interval resection of colonic mass, decreased size of previously noted right pelvic  adenopathy, new noted at the right internal/external iliac bifurcation, unchanged pulmonary nodules -Guardant reveal 12/06/2020-ctDNA not detected -Cycle 1 adjuvant Xeloda anticipated 01/06/2021 -Cycle 2 adjuvant Xeloda 01/27/2021, Xeloda dose reduced secondary to thrombocytopenia (he took 1 pill a day for an unclear amount of time, less than 14 days) -Cycle 3 adjuvant Xeloda 02/17/2021 2.  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 3.  Thrombocytopenia-chronic 4.  Atrial flutter 5.  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  COPD 7.  Hypertension 8.  Diabetes mellitus 9.  Dementia 10.  Tobacco dependence 11. Left wrist basal cell carcinoma -Skin biopsy 08/16/2020 - Basal cell carcinoma, nodular and infiltrative patterns, peripheral and deep margins involved -Seen by radiation oncology 09/03/2020 -Established care with the wound center on 09/13/2019 -Placed on Sonidegib by dermatology, marked clinical improvement 12.  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  Admission 06/05/2021 with presyncope 14.  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  Elevated LFTs 08/25/2021  Disposition: [name] is currently day 18 cycle 1 decitabine/venetoclax.  He has persistent severe pancytopenia.  He is in the office today to receive Red cell and platelet transfusion support.  LFTs are further elevated, creatinine 1.6.  He is tachycardic and hypotensive.  He had a low-grade fever during the blood transfusion.  EKG with A-fib with RVR.  He is being referred to the emergency department to stabilize prior to admission.  We are placing venetoclax and posaconazole on hold.  Patient seen with Dr. Benay Spice.    Ned Card ANP/GNP-BC   10/16/2021  11:16 AM  This was a shared visit with Ned Card.  Mr. Lincoln was interviewed and examined.  He presented today for scheduled follow-up.  He has persistent severe pancytopenia now at day 18 following cycle 1 decitabine/venetoclax.  He was transfused with packed red blood cells and platelets today.  He was noted to have tachycardia.  I discussed the case with Dr. Joan Mayans.  We discussed the elevated liver enzymes.  It is unclear whether the liver enzyme elevation is related to polypharmacy, leukemic infiltration of the liver, or an infection.  Our initial plan was to manage him as an outpatient, but an EKG revealed rapid atrial fibrillation and he had a low-grade fever.  He was referred to the emergency room to initiate an infection evaluation and and antibiotics.  Positive, as all and venetoclax will be placed on hold.  He will need hospital admission.  I communicated with the emergency room physician.  We will attempt to transfer him to the leukemia service at [facility].  I was present for greater than 50% of today's visit.  I performed medical decision making.  Julieanne Manson, MD

## 2021-10-16 NOTE — ED Provider Notes (Addendum)
**[name]** Shawn [name]   CSN: [id number] Arrival date & time: 10/16/21  1620     History  Chief Complaint  Patient presents with   Weakness    Shawn [name] is a 76 y.o. male.  Patient is a 76 year old male who has a complex medical history who was sent down from the cancer center with possible sepsis.  He has a history of AML.  He is currently getting treatments at [facility] but also was seen by the cancer center here locally.  He is on chemotherapy medications.  He has had some chronic pancytopenia.  He is on prophylactic antibiotics for his neutropenia.  He gets weekly transfusions of platelets and packed RBCs at the cancer center here.  He was there today for transfusions.  While he was there, he was noted to have a temperature of 100.7.  He was also noted to be in A-fib with RVR.  He denies any history of this but per chart review, I see that he has a history of A-fib and a flutter.  He is on digoxin and diltiazem.  I do not see that he is on anticoagulants.  He said he feels fatigued and a little short of breath but that is pretty much baseline for him.  He has not had any vomiting.  Has had some loose stool in his colostomy bag which is baseline for him.  No urinary symptoms.  He denies any skin lesions or sores.  He has not noted any fevers at home.  Dr. Benay Spice with the cancer center sent the patient down here for treatment of possible sepsis along with A-fib with RVR.  On review of his notes from this morning, he did receive 1 unit of platelets and 2 units of packed RBCs today at the cancer center.      Home Medications Prior to Admission medications   Medication Sig Start Date End Date Taking? Authorizing Provider  acetaminophen (TYLENOL) 325 MG tablet Take 2 tablets (650 mg total) by mouth every 6 (six) hours as needed for mild pain or fever. 11/01/20   Norm Parcel, PA-C  acyclovir (ZOVIRAX) 400 MG tablet Take 400 mg by mouth 2 (two)  times daily. 10/03/21   [provider]  albuterol (VENTOLIN HFA) 108 (90 Base) MCG/ACT inhaler Inhale 1-2 puffs into the lungs every 6 (six) hours as needed for wheezing or shortness of breath. Patient not taking: Reported on 08/25/2021 11/01/20   Modena Jansky, MD  ascorbic acid (VITAMIN C) 1000 MG tablet Take 1 tablet by mouth daily.    [provider]  digoxin (LANOXIN) 0.125 MG tablet Take 1 tablet (0.125 mg total) by mouth daily. Patient not taking: Reported on 08/25/2021 07/24/21   Zenia Resides, MD  diltiazem (CARDIZEM CD) 120 MG 24 hr capsule TAKE 1 CAPSULE(120 MG) BY MOUTH AT BEDTIME 09/29/21   Zenia Resides, MD  diltiazem (TIAZAC) 120 MG 24 hr capsule Take by mouth. 10/03/21   [provider]  Ferrous Sulfate (IRON) 90 (18 Fe) MG TABS SMARTSIG:1 Tablet(s) By Mouth Patient not taking: Reported on 07/28/2021 02/07/21   [provider]  levofloxacin (LEVAQUIN) 500 MG tablet Take 1 tablet (500 mg total) by mouth daily. 08/25/21   Owens Shark, NP  metoprolol tartrate (LOPRESSOR) 25 MG tablet Take 25 mg by mouth 2 (two) times daily. 08/25/21   [provider]  Multiple Vitamin (QUINTABS) TABS Take 1 tablet by mouth daily. 10/03/21  [provider]  ODOMZO 200 MG capsule Take 200 mg by mouth daily. 06/23/21   [provider]  ondansetron (ZOFRAN) 8 MG tablet Take 8 mg by mouth 3 (three) times daily. 10/03/21   [provider]  ondansetron (ZOFRAN) 8 MG tablet Take by mouth. 10/03/21   [provider]  pantoprazole (PROTONIX) 40 MG tablet Take 1 tablet (40 mg total) by mouth daily at 6 (six) AM. Patient not taking: Reported on 06/13/2021 01/29/21   Zenia Resides, MD  pantoprazole (PROTONIX) 40 MG tablet Take 1 tablet by mouth daily. 10/04/21   [provider]  polyethylene glycol powder (GLYCOLAX/MIRALAX) 17 GM/SCOOP powder Take by mouth.    [provider]  rosuvastatin (CRESTOR) 20 MG tablet Take 1  tablet (20 mg total) by mouth daily at 6 PM. 07/24/21   Hensel, Jamal Collin, MD  SPIRIVA HANDIHALER 18 MCG inhalation capsule PLACE ONE CAPSULE INTO THE INHALER AND INHALE DAILY Patient not taking: Reported on 09/25/2021 03/04/21   Zenia Resides, MD  venetoclax (VENCLEXTA) 100 MG tablet Take 100 mg by mouth daily. 10/03/21   [provider]      Allergies    Orange juice [orange oil]    Review of Systems   Review of Systems  Constitutional:  Positive for fatigue and fever. Negative for chills and diaphoresis.  HENT:  Negative for congestion, rhinorrhea and sneezing.   Eyes: Negative.   Respiratory:  Positive for shortness of breath. Negative for cough and chest tightness.   Cardiovascular:  Negative for chest pain and leg swelling.  Gastrointestinal:  Positive for diarrhea. Negative for abdominal pain, blood in stool, nausea and vomiting.  Genitourinary:  Negative for difficulty urinating, flank pain, frequency and hematuria.  Musculoskeletal:  Negative for arthralgias and back pain.  Skin:  Negative for rash.  Neurological:  Negative for dizziness, speech difficulty, weakness, numbness and headaches.   Physical Exam Updated Vital Signs BP 116/70 (BP Location: Left Arm)   Pulse (!) 133   Temp (!) 102.6 F (39.2 C) (Rectal)   Resp 18   Ht 6' (1.829 m)   Wt 68.7 kg   SpO2 98%   BMI 20.53 kg/m  Physical Exam Constitutional:      Appearance: He is well-developed.     Comments: Disheveled  HENT:     Head: Normocephalic and atraumatic.  Eyes:     Pupils: Pupils are equal, round, and reactive to light.  Cardiovascular:     Rate and Rhythm: Regular rhythm. Tachycardia present.     Heart sounds: Normal heart sounds.  Pulmonary:     Effort: Pulmonary effort is normal. No respiratory distress.     Breath sounds: Normal breath sounds. No wheezing or rales.  Chest:     Chest wall: No tenderness.  Abdominal:     General: Bowel sounds are normal.     Palpations: Abdomen is  soft.     Tenderness: There is no abdominal tenderness. There is no guarding or rebound.  Musculoskeletal:        General: Normal range of motion.     Cervical back: Normal range of motion and neck supple.  Lymphadenopathy:     Cervical: No cervical adenopathy.  Skin:    General: Skin is warm and dry.     Findings: No rash.  Neurological:     Mental Status: He is alert and oriented to person, place, and time.    ED Results / Procedures / Treatments  Labs (all labs ordered are listed, but only abnormal results are displayed) Labs Reviewed  LACTIC ACID, PLASMA - Abnormal; Notable for the following components:      Result Value   Lactic Acid, Venous 2.2 (*)    All other components within normal limits  PROTIME-INR - Abnormal; Notable for the following components:   Prothrombin Time 18.1 (*)    INR 1.5 (*)    All other components within normal limits  APTT - Abnormal; Notable for the following components:   aPTT 39 (*)    All other components within normal limits  URINALYSIS, ROUTINE W REFLEX MICROSCOPIC - Abnormal; Notable for the following components:   APPearance HAZY (*)    Hgb urine dipstick LARGE (*)    Bilirubin Urine MODERATE (*)    Protein, ur >300 (*)    Bacteria, UA RARE (*)    Non Squamous Epithelial 0-5 (*)    All other components within normal limits  DIGOXIN LEVEL - Abnormal; Notable for the following components:   Digoxin Level 0.2 (*)    All other components within normal limits  RESP PANEL BY RT-PCR (FLU A&B, COVID) ARPGX2  CULTURE, BLOOD (ROUTINE X 2)  CULTURE, BLOOD (ROUTINE X 2)  URINE CULTURE  LACTIC ACID, PLASMA  HEPATITIS PANEL, ACUTE    EKG EKG Interpretation  Date/Time:  Thursday Oct 16 2021 16:34:07 EDT Ventricular Rate:  128 PR Interval:    QRS Duration: 157 QT Interval:  306 QTC Calculation: 449 R Axis:   91 Text Interpretation: Right and left arm electrode reversal, interpretation assumes no reversal Atrial fibrillation RBBB and  LPFB Abnormal T, consider ischemia, lateral leads Artifact in lead(s) I II III aVR aVL aVF V1 V2 V5 V6 Confirmed by Malvin Johns [phone]) on 10/16/2021 5:35:04 PM  Radiology CT Abdomen Pelvis Wo Contrast  Result Date: 10/16/2021 CLINICAL DATA:  Abdominal pain, acute, nonlocalized hepatitis. Weakness, tachycardia EXAM: CT ABDOMEN AND PELVIS WITHOUT CONTRAST TECHNIQUE: Multidetector CT imaging of the abdomen and pelvis was performed following the standard protocol without IV contrast. RADIATION DOSE REDUCTION: This exam was performed according to the departmental dose-optimization program which includes automated exposure control, adjustment of the mA and/or kV according to patient size and/or use of iterative reconstruction technique. COMPARISON:  None Available. FINDINGS: Lower chest: Ground-glass opacity and focal subpleural consolidation within the visualized left lung base likely represents subsegmental atelectasis. Small right pleural effusion with associated right basilar compressive atelectasis. Cardiac size within normal limits. No pericardial effusion. Hepatobiliary: Scattered simple cyst within the left hepatic lobe are unchanged. Liver otherwise unremarkable. Gallbladder unremarkable. No intra or extrahepatic biliary ductal dilation. Pancreas: Unremarkable Spleen: Unremarkable Adrenals/Urinary Tract: Adrenal glands are unremarkable. Kidneys are normal, without renal calculi, focal lesion, or hydronephrosis. Bladder is unremarkable. Stomach/Bowel: Surgical changes of descending colostomy and Hartmann pouch formation are identified. The stomach, small bowel, and residual large bowel are otherwise unremarkable. No evidence of obstruction or focal inflammation. The appendix is normal. No free intraperitoneal gas or fluid. Vascular/Lymphatic: Mild aortoiliac atherosclerotic calcification is present. No aortic aneurysm. There is progressive pelvic and retroperitoneal adenopathy. Index lymph node within the  right external iliac lymph node group measures 18 mm in short axis diameter, axial image # 61/2. There is interval development of left para-aortic and contralateral left common iliac adenopathy. Reproductive: Moderate prostatic enlargement again noted. Seminal vesicles are unremarkable. Other: No abdominal wall hernia.  Rectum unremarkable. Musculoskeletal: No acute bone abnormality. No lytic or blastic bone lesion. Degenerative changes are seen  within the lumbar spine. IMPRESSION: Surgical changes of a distal colectomy with stable appearance of descending colostomy and Hartmann pouch formation. Progressive pelvic and retroperitoneal pathologic adenopathy. No evidence of obstruction. Probable subsegmental atelectasis within the left lower lobe, not fully assessed on this examination. Stable small right pleural effusion with associated right basilar atelectasis. Stable probable hepatic cysts. Aortic Atherosclerosis (ICD10-I70.0). Electronically Signed   By: Fidela Salisbury M.D.   On: 10/16/2021 17:59   DG Chest Port 1 View  Result Date: 10/16/2021 CLINICAL DATA:  Question sepsis EXAM: PORTABLE CHEST 1 VIEW COMPARISON:  Chest 08/18/2021 FINDINGS: Median sternotomy. Heart size within normal limits. Negative for heart failure Right perihilar infiltrate possible pneumonia. Small right effusion. Right arm PICC tip in the mid SVC. IMPRESSION: Right perihilar infiltrate. Possible pneumonia. Small right effusion. Electronically Signed   By: Franchot Gallo M.D.   On: 10/16/2021 17:24    Procedures Procedures    Medications Ordered in ED Medications  lactated ringers infusion ( Intravenous New Bag/Given 10/16/21 1713)  metroNIDAZOLE (FLAGYL) IVPB 500 mg (500 mg Intravenous New Bag/Given 10/16/21 1804)  vancomycin (VANCOCIN) IVPB 1000 mg/200 mL premix (has no administration in time range)  metoprolol tartrate (LOPRESSOR) injection 5 mg (has no administration in time range)  ceFEPIme (MAXIPIME) 2 g in sodium  chloride 0.9 % 100 mL IVPB (0 g Intravenous Stopped 10/16/21 1807)  sodium chloride 0.9 % bolus 500 mL (500 mLs Intravenous New Bag/Given 10/16/21 1750)  metoprolol tartrate (LOPRESSOR) injection 5 mg (5 mg Intravenous Given 10/16/21 1747)    ED Course/ Medical Decision Making/ A&P                           Medical Decision Making Amount and/or Complexity of Data Reviewed Labs: ordered. Radiology: ordered. ECG/medicine tests: ordered.  Risk Prescription drug management.   Patient is a 76 year old male who has a history of AML and is getting treatment at [facility].  He has pancytopenia with marked neutropenia.  He presents with fever today.  His temperature is 102.6 rectally in the emergency department.  Given this with his pancytopenia, he was treated for sepsis.  He was given IV antibiotics including cefepime, vancomycin and Flagyl.  He had been given 750 cc of normal saline at the cancer center prior to ED arrival.  He was also given 2 units of packed red cells and 1 unit of platelets prior to arrival.  He was given additional 500 cc of fluid here in the ED.  He was not given antipyretics given his contraindications to both Tylenol and ibuprofen.  His labs were reviewed from earlier today.  He does have marked pancytopenia.  His LFTs are markedly elevated which is a change from prior.  Hepatitis panel was sent.  In conversation with his oncologist, there is some concern that this may be related to his chemotherapy agents.  CT scan was performed which did not show any obvious etiology for the LFTs.  No evidence of cholangitis or obvious obstruction/gallstones.  He also remains in A-fib with RVR.  He does have a known history of A-fib/a flutter.  His heart rate is in the 130s.  He was initially treated with IV fluids without significant improvement.  He was given a dose of IV Lopressor which did bring it down into the 120s.  His blood pressure is remained stable although its little bit on the lower  side.  His last blood pressure is 112/69.  In consult  with his local oncologist, it was preferred that he be admitted to [facility] where most of his oncology care is performed.  I did speak with Dr. Ronnie Doss who is excepted the patient for transfer to [facility]  The radiology department was able to push the images over to [place] Performed by: Malvin Johns Total critical care time: 70 minutes Critical care time was exclusive of separately billable procedures and treating other patients. Critical care was necessary to treat or prevent imminent or life-threatening deterioration. Critical care was time spent personally by me on the following activities: development of treatment plan with patient and/or surrogate as well as nursing, discussions with consultants, evaluation of patient's response to treatment, examination of patient, obtaining history from patient or surrogate, ordering and performing treatments and interventions, ordering and review of laboratory studies, ordering and review of radiographic studies, pulse oximetry and re-evaluation of patient's condition.   Final Clinical Impression(s) / ED Diagnoses Final diagnoses:  Sepsis, due to unspecified organism, unspecified whether acute organ dysfunction present ([place])  Pancytopenia ([place])  Elevated LFTs  Atrial fibrillation with RVR [facility])    Rx / DC Orders ED Discharge Orders     None         Malvin Johns, MD 10/16/21 Mauro Kaufmann    Malvin Johns, MD 10/16/21 1900

## 2021-10-16 NOTE — Progress Notes (Signed)
Patient taken to [facility] ED per wheelchair. Report given to [facility].

## 2021-10-16 NOTE — Sepsis Progress Note (Signed)
Sepsis protocol monitored by eLink 

## 2021-10-16 NOTE — ED Notes (Signed)
Report given to [facility] RN @ [facility]

## 2021-10-16 NOTE — ED Notes (Signed)
Pt was given mac and cheese dinner and a drink.

## 2021-10-16 NOTE — ED Triage Notes (Signed)
Pt arrives to ED from [place] center with c/o weakness and tachycardia. Per cancer center pt had appointment today for blood transfusion for low hemoglobin and platelets. Before transfusion of 2 units blood and 1 bag platelets pt presented with tachycardia (HR 130's) and fever. Pt did complete transfusion today. Pt reports worsening weakness over last several weeks.

## 2021-10-16 NOTE — ED Notes (Signed)
Report given to [facility] transport with [facility]  15 min ETA

## 2021-10-16 NOTE — Telephone Encounter (Signed)
Faxed [phone] over lab result to [place].

## 2021-10-16 NOTE — Progress Notes (Signed)
Pharmacy Antibiotic Note  [name] is a 76 y.o. male for which pharmacy has been consulted for cefepime and vancomycin dosing for sepsis.  Patient presenting from cancer center with concern for sepsis.  SCr 1.63 - above baseline WBC 0.2; LA 1.8; T 102.6 F  Plan: Cefepime 2g q12hr Vancomycin 1000 mg given at [facility] ED >> will start vancomycin 1750 mg q48hr (eAUC 505.3) unless change in renal function Trend WBC, Fever, Renal function, & Clinical course F/u cultures, clinical course, WBC, fever De-escalate when able  Height: 6' (182.9 cm) Weight: 68.7 kg (151 lb 6.4 oz) IBW/kg (Calculated) : 77.6  Temp (24hrs), Avg:98.7 F (37.1 C), Min:97.8 F (36.6 C), Max:100 F (37.8 C)  Recent Labs  Lab 10/13/21 1130 10/16/21 0900 10/16/21 1128  WBC 0.2* 0.2*  --   CREATININE 1.24  --  1.63*    Estimated Creatinine Clearance: 36.9 mL/min (A) (by C-G formula based on SCr of 1.63 mg/dL (H)).    Allergies  Allergen Reactions   Orange Juice [Orange Oil] Hives   Antimicrobials this admission: cefepime 5/18 >>  vancomycin 5/18 >>   Microbiology results: Pending  Thank you for allowing pharmacy to be a part of this patient's care.  Lorelei Pont, PharmD, BCPS 10/16/2021 4:46 PM ED Clinical Pharmacist -  [phone]

## 2021-10-16 NOTE — Progress Notes (Signed)
1325 order received from Ned Card, NP to discontinue additional fluids 1400 Dr Benay Spice notified of temperature. EKG ordered per Dr Benay Spice.  [place] Dr Benay Spice given copy of EKG. Order  received to proceed with second unit blood. 1520 per Dr Benay Spice, patient to be sent to [facility] ER after his second unit is complete. Dr Benay Spice has already spoke to Dr Tamera Punt in the [facility] ED. Patient  agrees to this plan.

## 2021-10-17 LAB — BPAM PLATELET PHERESIS
Blood Product Expiration Date: 202305202359
ISSUE DATE / TIME: 202305180746
Unit Type and Rh: 5100

## 2021-10-17 LAB — PREPARE PLATELET PHERESIS: Unit division: 0

## 2021-10-17 LAB — TYPE AND SCREEN
ABO/RH(D): AB POS
Antibody Screen: NEGATIVE
Unit division: 0
Unit division: 0

## 2021-10-17 LAB — BPAM RBC
Blood Product Expiration Date: 202306082359
Blood Product Expiration Date: 202306082359
ISSUE DATE / TIME: 202305181031
ISSUE DATE / TIME: 202305181031
Unit Type and Rh: 6200
Unit Type and Rh: 6200

## 2021-10-17 LAB — URINE CULTURE

## 2021-10-19 LAB — CULTURE, BLOOD (ROUTINE X 2): Special Requests: ADEQUATE

## 2021-10-20 ENCOUNTER — Inpatient Hospital Stay: Payer: Medicare Other

## 2021-10-20 ENCOUNTER — Inpatient Hospital Stay: Payer: Medicare Other | Admitting: Family Medicine

## 2021-10-21 ENCOUNTER — Other Ambulatory Visit: Payer: Self-pay

## 2021-10-21 DIAGNOSIS — C9200 Acute myeloblastic leukemia, not having achieved remission: Secondary | ICD-10-CM

## 2021-10-21 LAB — CULTURE, BLOOD (ROUTINE X 2)
Culture: NO GROWTH
Special Requests: ADEQUATE

## 2021-10-23 ENCOUNTER — Inpatient Hospital Stay: Payer: Medicare Other | Admitting: Nurse Practitioner

## 2021-10-23 ENCOUNTER — Inpatient Hospital Stay: Payer: Medicare Other

## 2021-10-30 ENCOUNTER — Inpatient Hospital Stay: Payer: Medicare Other | Admitting: Oncology

## 2021-10-30 ENCOUNTER — Inpatient Hospital Stay: Payer: Medicare Other

## 2021-11-04 ENCOUNTER — Encounter: Payer: Self-pay | Admitting: *Deleted

## 2021-11-07 ENCOUNTER — Telehealth: Payer: Self-pay | Admitting: *Deleted

## 2021-11-07 DIAGNOSIS — C9200 Acute myeloblastic leukemia, not having achieved remission: Secondary | ICD-10-CM

## 2021-11-07 NOTE — Telephone Encounter (Signed)
Received fax from [facility] to begin weekly labs with PICC flush/dressing change on 11/13/21. He is being discharged to his home on 11/08/21. Orders placed and scheduling message sent.

## 2021-11-13 ENCOUNTER — Inpatient Hospital Stay: Payer: Medicare Other

## 2021-11-13 ENCOUNTER — Other Ambulatory Visit: Payer: Self-pay

## 2021-11-13 ENCOUNTER — Telehealth: Payer: Self-pay

## 2021-11-13 ENCOUNTER — Inpatient Hospital Stay: Payer: Medicare Other | Attending: Nurse Practitioner | Admitting: Oncology

## 2021-11-13 VITALS — BP 121/90 | HR 100 | Temp 98.1°F | Resp 18 | Ht 72.0 in | Wt 131.0 lb

## 2021-11-13 DIAGNOSIS — F0390 Unspecified dementia without behavioral disturbance: Secondary | ICD-10-CM | POA: Insufficient documentation

## 2021-11-13 DIAGNOSIS — Z452 Encounter for adjustment and management of vascular access device: Secondary | ICD-10-CM | POA: Insufficient documentation

## 2021-11-13 DIAGNOSIS — F17210 Nicotine dependence, cigarettes, uncomplicated: Secondary | ICD-10-CM | POA: Insufficient documentation

## 2021-11-13 DIAGNOSIS — D696 Thrombocytopenia, unspecified: Secondary | ICD-10-CM | POA: Insufficient documentation

## 2021-11-13 DIAGNOSIS — D509 Iron deficiency anemia, unspecified: Secondary | ICD-10-CM | POA: Insufficient documentation

## 2021-11-13 DIAGNOSIS — E119 Type 2 diabetes mellitus without complications: Secondary | ICD-10-CM | POA: Diagnosis not present

## 2021-11-13 DIAGNOSIS — C9200 Acute myeloblastic leukemia, not having achieved remission: Secondary | ICD-10-CM

## 2021-11-13 DIAGNOSIS — J449 Chronic obstructive pulmonary disease, unspecified: Secondary | ICD-10-CM | POA: Diagnosis not present

## 2021-11-13 DIAGNOSIS — Z85038 Personal history of other malignant neoplasm of large intestine: Secondary | ICD-10-CM | POA: Diagnosis not present

## 2021-11-13 DIAGNOSIS — I10 Essential (primary) hypertension: Secondary | ICD-10-CM | POA: Diagnosis not present

## 2021-11-13 DIAGNOSIS — R7989 Other specified abnormal findings of blood chemistry: Secondary | ICD-10-CM | POA: Diagnosis not present

## 2021-11-13 DIAGNOSIS — Z9221 Personal history of antineoplastic chemotherapy: Secondary | ICD-10-CM | POA: Diagnosis not present

## 2021-11-13 DIAGNOSIS — I4892 Unspecified atrial flutter: Secondary | ICD-10-CM | POA: Diagnosis not present

## 2021-11-13 LAB — SAMPLE TO BLOOD BANK

## 2021-11-13 LAB — CMP (CANCER CENTER ONLY)
ALT: 22 U/L (ref 0–44)
AST: 22 U/L (ref 15–41)
Albumin: 3.2 g/dL — ABNORMAL LOW (ref 3.5–5.0)
Alkaline Phosphatase: 84 U/L (ref 38–126)
Anion gap: 7 (ref 5–15)
BUN: 22 mg/dL (ref 8–23)
CO2: 31 mmol/L (ref 22–32)
Calcium: 9.5 mg/dL (ref 8.9–10.3)
Chloride: 104 mmol/L (ref 98–111)
Creatinine: 0.89 mg/dL (ref 0.61–1.24)
GFR, Estimated: >60 mL/min (ref >60)
Glucose, Bld: 109 mg/dL — ABNORMAL HIGH (ref 70–99)
Potassium: 4.4 mmol/L (ref 3.5–5.1)
Sodium: 142 mmol/L (ref 135–145)
Total Bilirubin: 1.8 mg/dL — ABNORMAL HIGH (ref 0.3–1.2)
Total Protein: 7.1 g/dL (ref 6.5–8.1)

## 2021-11-13 LAB — CBC WITH DIFFERENTIAL (CANCER CENTER ONLY)
Abs Immature Granulocytes: 0.03 10*3/uL (ref 0.00–0.07)
Basophils Absolute: 0.1 10*3/uL (ref 0.0–0.1)
Basophils Relative: 1 %
Eosinophils Absolute: 0 10*3/uL (ref 0.0–0.5)
Eosinophils Relative: 0 %
HCT: 34.6 % — ABNORMAL LOW (ref 39.0–52.0)
Hemoglobin: 10.8 g/dL — ABNORMAL LOW (ref 13.0–17.0)
Immature Granulocytes: 1 %
Lymphocytes Relative: 8 %
Lymphs Abs: 0.5 10*3/uL — ABNORMAL LOW (ref 0.7–4.0)
MCH: 30.5 pg (ref 26.0–34.0)
MCHC: 31.2 g/dL (ref 30.0–36.0)
MCV: 97.7 fL (ref 80.0–100.0)
Monocytes Absolute: 0.5 10*3/uL (ref 0.1–1.0)
Monocytes Relative: 8 %
Neutro Abs: 5.5 10*3/uL (ref 1.7–7.7)
Neutrophils Relative %: 82 %
Platelet Count: 162 10*3/uL (ref 150–400)
RBC: 3.54 MIL/uL — ABNORMAL LOW (ref 4.22–5.81)
RDW: 18.4 % — ABNORMAL HIGH (ref 11.5–15.5)
WBC Count: 6.6 10*3/uL (ref 4.0–10.5)
nRBC: 0 % (ref 0.0–0.2)

## 2021-11-13 LAB — MAGNESIUM: Magnesium: 1.8 mg/dL (ref 1.7–2.4)

## 2021-11-13 MED ORDER — SODIUM CHLORIDE 0.9% FLUSH
10.0000 mL | INTRAVENOUS | Status: DC | PRN
  Administered 2021-11-13: 10 mL via INTRAVENOUS

## 2021-11-13 MED ORDER — HEPARIN SOD (PORK) LOCK FLUSH 100 UNIT/ML IV SOLN: 500.0000 [IU] | Freq: Once | INTRAVENOUS | Status: DC

## 2021-11-13 NOTE — Patient Instructions (Signed)
PICC Home Care Guide A peripherally inserted central catheter (PICC) is a form of IV access that allows medicines and IV fluids to be quickly put into the blood and spread throughout the body. The PICC is a long, thin, flexible tube (catheter) that is put into a vein in a person's arm or leg. The catheter ends in a large vein just outside the heart called the superior vena cava (SVC). After the PICC is put in, a chest X-ray may be done to make sure that it is in the right place. A PICC may be placed for different reasons, such as: To give medicines and liquid nutrition. To give IV fluids and blood products. To take blood samples often. If there is trouble placing a peripheral intravenous (PIV) catheter. If cared for properly, a PICC can remain in place for many months. Having a PICC can allow you to go home from the hospital sooner and continue treatment at home. Medicines and PICC care can be managed at home by a family member, caregiver, or home health care team. What are the risks? Generally, having a PICC is safe. However, problems may occur, including: A blood clot (thrombus) forming in or at the end of the PICC. A blood clot forming in a vein (deep vein thrombosis) or traveling to the lung (pulmonary embolism). Inflammation of the vein (phlebitis) in which the PICC is placed. Infection at the insertion site or in the blood. Blood infections from central lines, like PICCs, can be serious and often require a hospital stay. PICC malposition, or PICC movement or poor placement. A break or cut in the PICC. Do not use scissors near the PICC. Nerve or tendon irritation or injury during PICC insertion. How to care for your PICC Please follow the specific guidelines provided by your health care provider. Preventing infection You and any caregivers should wash your hands often with soap and water for at least 20 seconds. Wash hands: Before touching the PICC or the infusion device. Before changing a  bandage (dressing). Do not change the dressing unless you have been taught to do so and have shown you are able to change it safely. Flush the PICC as told. Tell your health care provider right away if the PICC is hard to flush or does not flush. Do not use force to flush the PICC. Use clean and germ-free (sterile) supplies only. Keep the supplies in a dry place. Do not reuse needles, syringes, or any other supplies. Reusing supplies can lead to infection. Keep the PICC dressing dry and secure it with tape if the edges stop sticking to your skin. Check your PICC insertion site every day for signs of infection. Check for: Redness, swelling, or pain. Fluid or blood. Warmth. Pus or a bad smell. Preventing other problems Do not use a syringe that is less than 10 mL to flush the PICC. Do not have your blood pressure checked on the arm in which the PICC is placed. Do not ever pull or tug on the PICC. Keep it secured to your arm with tape or a stretch wrap when not in use. Do not take the PICC out yourself. Only a trained health care provider should remove the PICC. Keep pets and children away from your PICC. How to care for your PICC dressing Keep your PICC dressing clean and dry to prevent infection. Do not take baths, swim, or use a hot tub until your health care provider approves. Ask your health care provider if you can take   showers. You may only be allowed to take sponge baths. When you are allowed to shower: Ask your health care provider to teach you how to wrap the PICC. Cover the PICC with clear plastic wrap and tape to keep it dry while showering. Follow instructions from your health care provider about how to take care of your insertion site and dressing. Make sure you: Wash your hands with soap and water for at least 20 seconds before and after you change your dressing. If soap and water are not available, use hand sanitizer. Change your dressing only if taught to do so by your health care  provider. Your PICC dressing needs to be changed if it becomes loose or wet. Leave stitches (sutures), skin glue, or adhesive strips in place. These skin closures may need to stay in place for 2 weeks or longer. If adhesive strip edges start to loosen and curl up, you may trim the loose edges. Do not remove adhesive strips completely unless your health care provider tells you to do that. Follow these instructions at home: Disposal of supplies Throw away any syringes in a disposal container that is meant for sharp items (sharps container). You can buy a sharps container from a pharmacy, or you can make one by using an empty, hard plastic bottle with a lid. Place any used dressings or infusion bags into a plastic bag. Throw that bag in the trash. General instructions  Always carry your PICC identification card or wear a medical alert bracelet. Keep the tube clamped at all times, unless it is being used. Always carry a smooth-edge clamp with you to clamp the PICC if it breaks. Do not use scissors or sharp objects near the tube. You may bend your arm and move it freely. If your PICC is near or at the bend of your elbow, avoid activity with repeated motion at the elbow. Avoid lifting heavy objects as told by your health care provider. Keep all follow-up visits. This is important. You will need to have your PICC dressing changed at least once a week. Contact a health care provider if: You have pain in your arm, ear, face, or teeth. You have a fever or chills. You have redness, swelling, or pain around the insertion site. You have fluid or blood coming from the insertion site. Your insertion site feels warm to the touch. You have pus or a bad smell coming from the insertion site. Your skin feels hard and raised around the insertion site. Your PICC dressing has gotten wet or is coming off and you have not been taught how to change it. Get help right away if: You have problems with your PICC, such as  your PICC: Was tugged or pulled and has partially come out. Do not  push the PICC back in. Cannot be flushed, is hard to flush, or leaks around the insertion site when it is flushed. Makes a flushing sound when it is flushed. Appears to have a hole or tear. Is accidentally pulled all the way out. If this happens, cover the insertion site with a gauze dressing. Do not throw the PICC away. Your health care provider will need to check it to be sure the entire catheter came out. You feel your heart racing or skipping beats, or you have chest pain. You have shortness of breath or trouble breathing. You have swelling, redness, warmth, or pain in the arm in which the PICC is placed. You have a red streak going up your arm that   starts under the PICC dressing. These symptoms may be an emergency. Get help right away. Call 911. Do not wait to see if the symptoms will go away. Do not drive yourself to the hospital. Summary A peripherally inserted central catheter (PICC) is a long, thin, flexible tube (catheter) that is put into a vein in the arm or leg. If cared for properly, a PICC can remain in place for many months. Having a PICC can allow you to go home from the hospital sooner and continue treatment at home. The PICC is inserted using a germ-free (sterile) technique by a specially trained health care provider. Only a trained health care provider should remove it. Do not have your blood pressure checked on the arm in which your PICC is placed. Always keep your PICC identification card with you. This information is not intended to replace advice given to you by your health care provider. Make sure you discuss any questions you have with your health care provider. Document Revised: 12/04/2020 Document Reviewed: 12/04/2020 Elsevier Patient Education  2023 Elsevier Inc.  

## 2021-11-13 NOTE — Telephone Encounter (Signed)
Labs faxed to [facility] [phone]

## 2021-11-13 NOTE — Telephone Encounter (Signed)
TC to Campbell Stall NP at [facility] [phone] in reference to Pt being scheduled for treatment this coming Monday. Jinny Blossom stated she would get in contact with Pt and Pt's Son to schedule appointment.

## 2021-11-13 NOTE — Progress Notes (Signed)
[place] [facility] OFFICE PROGRESS NOTE   Diagnosis: AML  INTERVAL HISTORY:   Mr. Steel was admitted to [facility] last month with persistent severe pancytopenia in the setting of persistent AML.  He was treated with salvage systemic therapy consisting of decitabine for 5 days and daily venetoclax beginning 09/29/2021.  He entered clinical remission.  A bone marrow biopsy 10/22/2021 revealed no residual blasts.  His counts have recovered.  He was scheduled to be admitted 11/06/2021 for cycle 2 decitabine, but he wanted to wait until after the Father's Day weekend. He denies fever and bleeding.  No complaint.  Objective:  Vital signs in last 24 hours:  Blood pressure 121/90, pulse 100, temperature 98.1 F (36.7 C), temperature source Oral, resp. rate 18, height 6' (1.829 m), weight 131 lb (59.4 kg), SpO2 99 %.    HEENT: No thrush or bleeding Resp: Lungs clear bilaterally Cardio: Regular rate and rhythm GI: No hepatosplenomegaly, left lower quadrant colostomy Vascular: No leg edema  Skin: Multiple ecchymoses over the extremities, scarring at sites of previous left arm basal cell carcinomas  Portacath/PICC-without erythema  Lab Results:  Lab Results  Component Value Date   WBC 6.6 11/13/2021   HGB 10.8 (L) 11/13/2021   HCT 34.6 (L) 11/13/2021   MCV 97.7 11/13/2021   PLT 162 11/13/2021   NEUTROABS 5.5 11/13/2021    CMP  Lab Results  Component Value Date   NA 141 10/16/2021   K 3.2 (L) 10/16/2021   CL 108 10/16/2021   CO2 23 10/16/2021   GLUCOSE 134 (H) 10/16/2021   BUN 45 (H) 10/16/2021   CREATININE 1.63 (H) 10/16/2021   CALCIUM 8.3 (L) 10/16/2021   PROT 6.6 10/16/2021   ALBUMIN 2.9 (L) 10/16/2021   AST 233 (HH) 10/16/2021   ALT 302 (HH) 10/16/2021   ALKPHOS 118 10/16/2021   BILITOT 5.3 ([place]) 10/16/2021   GFRNONAA 43 (L) 10/16/2021   GFRAA 79 01/29/2020      Medications: I have reviewed the patient's current medications.   Assessment/Plan: Sigmoid  colon adenocarcinoma -Colonoscopy 10/21/2020- partially obstructing mass found in the sigmoid colon consistent with adenocarcinoma. -CEA on 10/22/2020 was 3.9 -CTs 10/22/2020-colonic mass with pelvic lymphadenopathy including right common iliac and external iliac nodes, small pulmonary nodules -10/24/2020-sigmoid colectomy, end colostomy, tumor stuck to pelvic sidewall with rind of tissue remaining at completion of surgery -Pathology- moderately differentiated adenocarcinoma the sigmoid colon, tumor extends into pericolonic connective tissue, no lymphovascular perineural invasion, 0/14 lymph nodes, carcinoma involves an area with the specimen was disrupted at mesenteric margin; mismatch repair protein (IHC) normal; MSI stable -CTs 11/24/2020-interval resection of colonic mass, decreased size of previously noted right pelvic adenopathy, new noted at the right internal/external iliac bifurcation, unchanged pulmonary nodules -Guardant reveal 12/06/2020-ctDNA not detected -Cycle 1 adjuvant Xeloda anticipated 01/06/2021 -Cycle 2 adjuvant Xeloda 01/27/2021, Xeloda dose reduced secondary to thrombocytopenia (he took 1 pill a day for an unclear amount of time, less than 14 days) -Cycle 3 adjuvant Xeloda 02/17/2021 2.  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 3.  Thrombocytopenia-chronic 4.  Atrial flutter 5.  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  COPD 7.  Hypertension 8.  Diabetes mellitus 9.  Dementia 10.  Tobacco dependence 11. Left wrist basal cell carcinoma -Skin biopsy 08/16/2020 - Basal cell carcinoma, nodular and infiltrative patterns, peripheral and deep margins involved -Seen by radiation oncology 09/03/2020 -Established care with the wound center on 09/13/2019 -Placed on Sonidegib by dermatology, marked  clinical improvement 12.  Admission with GI bleeding 11/20/2020 Endoscopy 11/21/2020-mild prepyloric erosions, gastritis Colonoscopy 11/22/2020-no  bleeding source identified Capsule endoscopy 11/22/2020-probable AVM with active oozing in the mid small bowel, 1 other small AVM more distally-both out of reach of enteroscope 11/26/2020 balloon enteroscopy-single nonbleeding AVM in the jejunum treated with argon plasma coagulation   13.  Admission 06/05/2021 with presyncope 14.  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 06/10/2021 Cycle one 5-azacytidine daily x5 days beginning 06/16/2021,Ziextenzo 06/23/2021 Cycle two 5-azacytidine daily x5 days beginning 07/14/2021, Ziextenzo 07/21/2018 Bone marrow biopsy 09/23/2021-cellular marrow involved by acute myeloid leukemia Bone marrow biopsy 09/29/2021-persistent acute myeloid leukemia in a variably hypocellular marrow (10 to 30%) with increased blasts (48%), FLT3 mutation negative, no metaphases for cytogenetics, NGS-IDH 1, RUNX1, U2AF1, DNMT3A cycle 1 decitabine and venetoclax beginning 09/29/2021, decitabine 20 mg/m daily x5, venetoclax 400 mg daily (reduced to 100 mg when on concomitant posaconazole and then 200 mg daily when switched to Diflucan Bone marrow biopsy 5 2423-25% cellularity, no increase in blast, MRD positive in 0.03% of cells 15.  Elevated LFTs 08/25/2021   Disposition: Mr. Howland has a history of severe pancytopenia secondary to AML.  He has entered clinical remission after a course of decitabine and venetoclax.  He has experienced marked improvement in the pancytopenia.  He says he will agree to begin cycle 2 decitabine next week.  We will contact the leukemia service at [facility] to coordinate the treatment plan.  He will continue follow-up here for management of the PICC and labs.  We will clarify the current antibiotic prophylactic regimen and venetoclax dosing with the [facility] team.  A follow-up appointment will be arranged within the next few weeks.  He says he is no longer taking ODOMZO for the basal cell carcinoma.  Betsy Coder, MD  11/13/2021   11:37 AM

## 2021-11-17 ENCOUNTER — Encounter: Payer: Self-pay | Admitting: *Deleted

## 2021-11-17 NOTE — Progress Notes (Signed)
Notified by [facility] that he was not admitted this week due to lack of transportation. Will admit next week. Will need lab/PICC flush and dressing change on 6/22. Informed nurse he is already scheduled. She reports he acted like he was not aware. Will have scheduler reach out to him with appointment and transportation.

## 2021-11-20 ENCOUNTER — Inpatient Hospital Stay: Payer: Medicare Other

## 2021-11-20 ENCOUNTER — Encounter: Payer: Self-pay | Admitting: *Deleted

## 2021-11-20 VITALS — BP 115/91 | HR 76 | Temp 97.5°F | Resp 18

## 2021-11-20 DIAGNOSIS — Z452 Encounter for adjustment and management of vascular access device: Secondary | ICD-10-CM | POA: Diagnosis not present

## 2021-11-20 DIAGNOSIS — C9200 Acute myeloblastic leukemia, not having achieved remission: Secondary | ICD-10-CM

## 2021-11-20 LAB — CBC WITH DIFFERENTIAL (CANCER CENTER ONLY)
Abs Immature Granulocytes: 0.02 K/uL (ref 0.00–0.07)
Basophils Absolute: 0 K/uL (ref 0.0–0.1)
Basophils Relative: 1 %
Eosinophils Absolute: 0 K/uL (ref 0.0–0.5)
Eosinophils Relative: 0 %
HCT: 36.4 % — ABNORMAL LOW (ref 39.0–52.0)
Hemoglobin: 11.3 g/dL — ABNORMAL LOW (ref 13.0–17.0)
Immature Granulocytes: 0 %
Lymphocytes Relative: 8 %
Lymphs Abs: 0.6 K/uL — ABNORMAL LOW (ref 0.7–4.0)
MCH: 30.2 pg (ref 26.0–34.0)
MCHC: 31 g/dL (ref 30.0–36.0)
MCV: 97.3 fL (ref 80.0–100.0)
Monocytes Absolute: 0.5 K/uL (ref 0.1–1.0)
Monocytes Relative: 6 %
Neutro Abs: 6.8 K/uL (ref 1.7–7.7)
Neutrophils Relative %: 85 %
Platelet Count: 121 K/uL — ABNORMAL LOW (ref 150–400)
RBC: 3.74 MIL/uL — ABNORMAL LOW (ref 4.22–5.81)
RDW: 18.3 % — ABNORMAL HIGH (ref 11.5–15.5)
WBC Count: 8 K/uL (ref 4.0–10.5)
nRBC: 0 % (ref 0.0–0.2)

## 2021-11-20 LAB — CMP (CANCER CENTER ONLY)
ALT: 16 U/L (ref 0–44)
AST: 17 U/L (ref 15–41)
Albumin: 3.4 g/dL — ABNORMAL LOW (ref 3.5–5.0)
Alkaline Phosphatase: 88 U/L (ref 38–126)
Anion gap: 5 (ref 5–15)
BUN: 21 mg/dL (ref 8–23)
CO2: 32 mmol/L (ref 22–32)
Calcium: 9.6 mg/dL (ref 8.9–10.3)
Chloride: 104 mmol/L (ref 98–111)
Creatinine: 1.13 mg/dL (ref 0.61–1.24)
GFR, Estimated: >60 mL/min
Glucose, Bld: 79 mg/dL (ref 70–99)
Potassium: 4.5 mmol/L (ref 3.5–5.1)
Sodium: 141 mmol/L (ref 135–145)
Total Bilirubin: 1.6 mg/dL — ABNORMAL HIGH (ref 0.3–1.2)
Total Protein: 7.2 g/dL (ref 6.5–8.1)

## 2021-11-20 LAB — SAMPLE TO BLOOD BANK

## 2021-11-20 LAB — MAGNESIUM: Magnesium: 2 mg/dL (ref 1.7–2.4)

## 2021-11-20 MED ORDER — SODIUM CHLORIDE 0.9% FLUSH
10.0000 mL | Freq: Once | INTRAVENOUS | Status: AC
  Administered 2021-11-20: 10 mL via INTRAVENOUS

## 2021-11-20 MED ORDER — HEPARIN SOD (PORK) LOCK FLUSH 100 UNIT/ML IV SOLN
250.0000 [IU] | Freq: Once | INTRAVENOUS | Status: AC
  Administered 2021-11-20: 250 [IU] via INTRAVENOUS

## 2021-11-20 NOTE — Progress Notes (Signed)
Faxed CBC/diff, CMP, Mg+ to [facility] at [phone]. There were no critical results to call in.

## 2021-11-27 ENCOUNTER — Inpatient Hospital Stay: Payer: Medicare Other

## 2021-12-04 ENCOUNTER — Inpatient Hospital Stay: Payer: Medicare Other

## 2021-12-04 ENCOUNTER — Inpatient Hospital Stay: Payer: Medicare Other | Attending: Nurse Practitioner | Admitting: Nurse Practitioner

## 2021-12-04 ENCOUNTER — Encounter: Payer: Self-pay | Admitting: *Deleted

## 2021-12-04 ENCOUNTER — Encounter: Payer: Self-pay | Admitting: Nurse Practitioner

## 2021-12-04 VITALS — BP 100/60 | HR 60 | Temp 98.1°F | Resp 18 | Ht 72.0 in | Wt 132.0 lb

## 2021-12-04 DIAGNOSIS — C9200 Acute myeloblastic leukemia, not having achieved remission: Secondary | ICD-10-CM | POA: Insufficient documentation

## 2021-12-04 DIAGNOSIS — R7989 Other specified abnormal findings of blood chemistry: Secondary | ICD-10-CM | POA: Diagnosis not present

## 2021-12-04 DIAGNOSIS — D696 Thrombocytopenia, unspecified: Secondary | ICD-10-CM | POA: Insufficient documentation

## 2021-12-04 DIAGNOSIS — Z452 Encounter for adjustment and management of vascular access device: Secondary | ICD-10-CM | POA: Diagnosis not present

## 2021-12-04 DIAGNOSIS — F17210 Nicotine dependence, cigarettes, uncomplicated: Secondary | ICD-10-CM | POA: Insufficient documentation

## 2021-12-04 DIAGNOSIS — J449 Chronic obstructive pulmonary disease, unspecified: Secondary | ICD-10-CM | POA: Diagnosis not present

## 2021-12-04 DIAGNOSIS — I4892 Unspecified atrial flutter: Secondary | ICD-10-CM | POA: Diagnosis not present

## 2021-12-04 DIAGNOSIS — C187 Malignant neoplasm of sigmoid colon: Secondary | ICD-10-CM | POA: Insufficient documentation

## 2021-12-04 DIAGNOSIS — F0390 Unspecified dementia without behavioral disturbance: Secondary | ICD-10-CM | POA: Diagnosis not present

## 2021-12-04 DIAGNOSIS — E119 Type 2 diabetes mellitus without complications: Secondary | ICD-10-CM | POA: Diagnosis not present

## 2021-12-04 DIAGNOSIS — I10 Essential (primary) hypertension: Secondary | ICD-10-CM | POA: Insufficient documentation

## 2021-12-04 DIAGNOSIS — D509 Iron deficiency anemia, unspecified: Secondary | ICD-10-CM | POA: Diagnosis not present

## 2021-12-04 LAB — CBC WITH DIFFERENTIAL (CANCER CENTER ONLY)
Abs Immature Granulocytes: 0.04 10*3/uL (ref 0.00–0.07)
Basophils Absolute: 0 10*3/uL (ref 0.0–0.1)
Basophils Relative: 0 %
Eosinophils Absolute: 0 10*3/uL (ref 0.0–0.5)
Eosinophils Relative: 0 %
HCT: 31.4 % — ABNORMAL LOW (ref 39.0–52.0)
Hemoglobin: 9.9 g/dL — ABNORMAL LOW (ref 13.0–17.0)
Immature Granulocytes: 1 %
Lymphocytes Relative: 5 %
Lymphs Abs: 0.3 10*3/uL — ABNORMAL LOW (ref 0.7–4.0)
MCH: 30.4 pg (ref 26.0–34.0)
MCHC: 31.5 g/dL (ref 30.0–36.0)
MCV: 96.3 fL (ref 80.0–100.0)
Monocytes Absolute: 0.2 10*3/uL (ref 0.1–1.0)
Monocytes Relative: 2 %
Neutro Abs: 6.3 10*3/uL (ref 1.7–7.7)
Neutrophils Relative %: 92 %
Platelet Count: 48 10*3/uL — ABNORMAL LOW (ref 150–400)
RBC: 3.26 MIL/uL — ABNORMAL LOW (ref 4.22–5.81)
RDW: 17.2 % — ABNORMAL HIGH (ref 11.5–15.5)
WBC Count: 6.9 10*3/uL (ref 4.0–10.5)
nRBC: 0 % (ref 0.0–0.2)

## 2021-12-04 LAB — CMP (CANCER CENTER ONLY)
ALT: 28 U/L (ref 0–44)
AST: 24 U/L (ref 15–41)
Albumin: 3.5 g/dL (ref 3.5–5.0)
Alkaline Phosphatase: 94 U/L (ref 38–126)
Anion gap: 8 (ref 5–15)
BUN: 32 mg/dL — ABNORMAL HIGH (ref 8–23)
CO2: 28 mmol/L (ref 22–32)
Calcium: 9.6 mg/dL (ref 8.9–10.3)
Chloride: 103 mmol/L (ref 98–111)
Creatinine: 1.07 mg/dL (ref 0.61–1.24)
GFR, Estimated: >60 mL/min (ref >60)
Glucose, Bld: 105 mg/dL — ABNORMAL HIGH (ref 70–99)
Potassium: 4.8 mmol/L (ref 3.5–5.1)
Sodium: 139 mmol/L (ref 135–145)
Total Bilirubin: 1.6 mg/dL — ABNORMAL HIGH (ref 0.3–1.2)
Total Protein: 7.5 g/dL (ref 6.5–8.1)

## 2021-12-04 LAB — MAGNESIUM: Magnesium: 2 mg/dL (ref 1.7–2.4)

## 2021-12-04 LAB — SAMPLE TO BLOOD BANK

## 2021-12-04 MED ORDER — HEPARIN SOD (PORK) LOCK FLUSH 100 UNIT/ML IV SOLN
500.0000 [IU] | Freq: Once | INTRAVENOUS | Status: AC
  Administered 2021-12-04: 250 [IU] via INTRAVENOUS

## 2021-12-04 MED ORDER — SODIUM CHLORIDE 0.9% FLUSH
10.0000 mL | Freq: Once | INTRAVENOUS | Status: AC
  Administered 2021-12-04: 10 mL via INTRAVENOUS

## 2021-12-04 NOTE — Progress Notes (Signed)
[place] OFFICE PROGRESS NOTE   Diagnosis: AML  INTERVAL HISTORY:   [name] returns as scheduled.  He was admitted for cycle 2 decitabine/venetoclax 11/24/2021 - 11/28/2021.  He feels well.  No nausea or vomiting.  No mouth sores.  No diarrhea.  He denies bleeding.  No fever.  He has a good appetite.  Objective:  Vital signs in last 24 hours:  Blood pressure 100/60, pulse 60, temperature 98.1 F (36.7 C), temperature source Oral, resp. rate 18, height 6' (1.829 m), weight 132 lb (59.9 kg), SpO2 100 %.    HEENT: No thrush or ulcers. Resp: Scattered expiratory rhonchi, no respiratory distress.  Rhonchi clear with coughing. Cardio: Regular rate and rhythm. GI: No hepatosplenomegaly.  Left lower quadrant colostomy. Vascular: No leg edema. Skin: Ecchymoses scattered over the extremities.  Right upper extremity PICC without erythema.  Lab Results:  Lab Results  Component Value Date   WBC 6.9 12/04/2021   HGB 9.9 (L) 12/04/2021   HCT 31.4 (L) 12/04/2021   MCV 96.3 12/04/2021   PLT 48 (L) 12/04/2021   NEUTROABS 6.3 12/04/2021    Imaging:  No results found.  Medications: I have reviewed the patient's current medications.  Assessment/Plan: Sigmoid colon adenocarcinoma -Colonoscopy 10/21/2020- partially obstructing mass found in the sigmoid colon consistent with adenocarcinoma. -CEA on 10/22/2020 was 3.9 -CTs 10/22/2020-colonic mass with pelvic lymphadenopathy including right common iliac and external iliac nodes, small pulmonary nodules -10/24/2020-sigmoid colectomy, end colostomy, tumor stuck to pelvic sidewall with rind of tissue remaining at completion of surgery -Pathology- moderately differentiated adenocarcinoma the sigmoid colon, tumor extends into pericolonic connective tissue, no lymphovascular perineural invasion, 0/14 lymph nodes, carcinoma involves an area with the specimen was disrupted at mesenteric margin; mismatch repair protein (IHC) normal;  MSI stable -CTs 11/24/2020-interval resection of colonic mass, decreased size of previously noted right pelvic adenopathy, new noted at the right internal/external iliac bifurcation, unchanged pulmonary nodules -Guardant reveal 12/06/2020-ctDNA not detected -Cycle 1 adjuvant Xeloda anticipated 01/06/2021 -Cycle 2 adjuvant Xeloda 01/27/2021, Xeloda dose reduced secondary to thrombocytopenia (he took 1 pill a day for an unclear amount of time, less than 14 days) -Cycle 3 adjuvant Xeloda 02/17/2021 2.  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 3.  Thrombocytopenia-chronic 4.  Atrial flutter 5.  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  COPD 7.  Hypertension 8.  Diabetes mellitus 9.  Dementia 10.  Tobacco dependence 11. Left wrist basal cell carcinoma -Skin biopsy 08/16/2020 - Basal cell carcinoma, nodular and infiltrative patterns, peripheral and deep margins involved -Seen by radiation oncology 09/03/2020 -Established care with the wound center on 09/13/2019 -Placed on Sonidegib by dermatology, marked clinical improvement 12.  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  Admission 06/05/2021 with presyncope 14.  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 06/10/2021 Cycle one 5-azacytidine daily x5 days beginning 06/16/2021,Ziextenzo 06/23/2021 Cycle two 5-azacytidine daily x5 days beginning 07/14/2021, Ziextenzo 07/21/2018 Bone marrow biopsy 09/23/2021-cellular marrow involved by acute myeloid leukemia Bone marrow biopsy 09/29/2021-persistent acute myeloid leukemia in a  variably hypocellular marrow (10 to 30%) with increased blasts (48%), FLT3 mutation negative, no metaphases for cytogenetics, NGS-IDH 1, RUNX1, U2AF1, DNMT3A cycle 1  decitabine and venetoclax beginning 09/29/2021, decitabine 20 mg/m daily x5, venetoclax 400 mg daily (reduced to 100 mg when on concomitant posaconazole and then 200 mg daily when switched to Diflucan Bone marrow biopsy 5 2423-25% cellularity, no increase in blast, MRD positive in 0.03% of cells Cycle 2 decitabine/venetoclax 11/24/2021 15.  Elevated LFTs 08/25/2021  Disposition: Mr. Mandt appears stable.  He began cycle 2 decitabine/venetoclax on 11/24/2021.  CBC and chemistry panel from today reviewed.  No transfusions indicated.  We will continue weekly labs and PICC care in our office.  We will see him in follow-up in approximately 1 month.  We are available to see him sooner if needed.   Ned Card ANP/GNP-BC   12/04/2021  11:14 AM

## 2021-12-04 NOTE — Patient Instructions (Signed)
PICC Home Care Guide A peripherally inserted central catheter (PICC) is a form of IV access that allows medicines and IV fluids to be quickly put into the blood and spread throughout the body. The PICC is a long, thin, flexible tube (catheter) that is put into a vein in a person's arm or leg. The catheter ends in a large vein just outside the heart called the superior vena cava (SVC). After the PICC is put in, a chest X-ray may be done to make sure that it is in the right place. A PICC may be placed for different reasons, such as: To give medicines and liquid nutrition. To give IV fluids and blood products. To take blood samples often. If there is trouble placing a peripheral intravenous (PIV) catheter. If cared for properly, a PICC can remain in place for many months. Having a PICC can allow you to go home from the hospital sooner and continue treatment at home. Medicines and PICC care can be managed at home by a family member, caregiver, or home health care team. What are the risks? Generally, having a PICC is safe. However, problems may occur, including: A blood clot (thrombus) forming in or at the end of the PICC. A blood clot forming in a vein (deep vein thrombosis) or traveling to the lung (pulmonary embolism). Inflammation of the vein (phlebitis) in which the PICC is placed. Infection at the insertion site or in the blood. Blood infections from central lines, like PICCs, can be serious and often require a hospital stay. PICC malposition, or PICC movement or poor placement. A break or cut in the PICC. Do not use scissors near the PICC. Nerve or tendon irritation or injury during PICC insertion. How to care for your PICC Please follow the specific guidelines provided by your health care provider. Preventing infection You and any caregivers should wash your hands often with soap and water for at least 20 seconds. Wash hands: Before touching the PICC or the infusion device. Before changing a  bandage (dressing). Do not change the dressing unless you have been taught to do so and have shown you are able to change it safely. Flush the PICC as told. Tell your health care provider right away if the PICC is hard to flush or does not flush. Do not use force to flush the PICC. Use clean and germ-free (sterile) supplies only. Keep the supplies in a dry place. Do not reuse needles, syringes, or any other supplies. Reusing supplies can lead to infection. Keep the PICC dressing dry and secure it with tape if the edges stop sticking to your skin. Check your PICC insertion site every day for signs of infection. Check for: Redness, swelling, or pain. Fluid or blood. Warmth. Pus or a bad smell. Preventing other problems Do not use a syringe that is less than 10 mL to flush the PICC. Do not have your blood pressure checked on the arm in which the PICC is placed. Do not ever pull or tug on the PICC. Keep it secured to your arm with tape or a stretch wrap when not in use. Do not take the PICC out yourself. Only a trained health care provider should remove the PICC. Keep pets and children away from your PICC. How to care for your PICC dressing Keep your PICC dressing clean and dry to prevent infection. Do not take baths, swim, or use a hot tub until your health care provider approves. Ask your health care provider if you can take   showers. You may only be allowed to take sponge baths. When you are allowed to shower: Ask your health care provider to teach you how to wrap the PICC. Cover the PICC with clear plastic wrap and tape to keep it dry while showering. Follow instructions from your health care provider about how to take care of your insertion site and dressing. Make sure you: Wash your hands with soap and water for at least 20 seconds before and after you change your dressing. If soap and water are not available, use hand sanitizer. Change your dressing only if taught to do so by your health care  provider. Your PICC dressing needs to be changed if it becomes loose or wet. Leave stitches (sutures), skin glue, or adhesive strips in place. These skin closures may need to stay in place for 2 weeks or longer. If adhesive strip edges start to loosen and curl up, you may trim the loose edges. Do not remove adhesive strips completely unless your health care provider tells you to do that. Follow these instructions at home: Disposal of supplies Throw away any syringes in a disposal container that is meant for sharp items (sharps container). You can buy a sharps container from a pharmacy, or you can make one by using an empty, hard plastic bottle with a lid. Place any used dressings or infusion bags into a plastic bag. Throw that bag in the trash. General instructions  Always carry your PICC identification card or wear a medical alert bracelet. Keep the tube clamped at all times, unless it is being used. Always carry a smooth-edge clamp with you to clamp the PICC if it breaks. Do not use scissors or sharp objects near the tube. You may bend your arm and move it freely. If your PICC is near or at the bend of your elbow, avoid activity with repeated motion at the elbow. Avoid lifting heavy objects as told by your health care provider. Keep all follow-up visits. This is important. You will need to have your PICC dressing changed at least once a week. Contact a health care provider if: You have pain in your arm, ear, face, or teeth. You have a fever or chills. You have redness, swelling, or pain around the insertion site. You have fluid or blood coming from the insertion site. Your insertion site feels warm to the touch. You have pus or a bad smell coming from the insertion site. Your skin feels hard and raised around the insertion site. Your PICC dressing has gotten wet or is coming off and you have not been taught how to change it. Get help right away if: You have problems with your PICC, such as  your PICC: Was tugged or pulled and has partially come out. Do not  push the PICC back in. Cannot be flushed, is hard to flush, or leaks around the insertion site when it is flushed. Makes a flushing sound when it is flushed. Appears to have a hole or tear. Is accidentally pulled all the way out. If this happens, cover the insertion site with a gauze dressing. Do not throw the PICC away. Your health care provider will need to check it to be sure the entire catheter came out. You feel your heart racing or skipping beats, or you have chest pain. You have shortness of breath or trouble breathing. You have swelling, redness, warmth, or pain in the arm in which the PICC is placed. You have a red streak going up your arm that   starts under the PICC dressing. These symptoms may be an emergency. Get help right away. Call 911. Do not wait to see if the symptoms will go away. Do not drive yourself to the hospital. Summary A peripherally inserted central catheter (PICC) is a long, thin, flexible tube (catheter) that is put into a vein in the arm or leg. If cared for properly, a PICC can remain in place for many months. Having a PICC can allow you to go home from the hospital sooner and continue treatment at home. The PICC is inserted using a germ-free (sterile) technique by a specially trained health care provider. Only a trained health care provider should remove it. Do not have your blood pressure checked on the arm in which your PICC is placed. Always keep your PICC identification card with you. This information is not intended to replace advice given to you by your health care provider. Make sure you discuss any questions you have with your health care provider. Document Revised: 12/04/2020 Document Reviewed: 12/04/2020 Elsevier Patient Education  2023 Elsevier Inc.  

## 2021-12-04 NOTE — Progress Notes (Signed)
Faxed 12/04/21 labs to [facility] [phone]. No critical results to call or act on.

## 2021-12-11 ENCOUNTER — Inpatient Hospital Stay: Payer: Medicare Other

## 2021-12-11 ENCOUNTER — Telehealth: Payer: Self-pay | Admitting: *Deleted

## 2021-12-11 VITALS — BP 135/62 | HR 77 | Temp 98.1°F | Resp 18 | Wt 137.0 lb

## 2021-12-11 DIAGNOSIS — C9200 Acute myeloblastic leukemia, not having achieved remission: Secondary | ICD-10-CM

## 2021-12-11 DIAGNOSIS — Z452 Encounter for adjustment and management of vascular access device: Secondary | ICD-10-CM

## 2021-12-11 LAB — CBC WITH DIFFERENTIAL (CANCER CENTER ONLY)
Abs Immature Granulocytes: 0 10*3/uL (ref 0.00–0.07)
Basophils Absolute: 0 10*3/uL (ref 0.0–0.1)
Basophils Relative: 0 %
Eosinophils Absolute: 0 10*3/uL (ref 0.0–0.5)
Eosinophils Relative: 0 %
HCT: 27 % — ABNORMAL LOW (ref 39.0–52.0)
Hemoglobin: 9 g/dL — ABNORMAL LOW (ref 13.0–17.0)
Immature Granulocytes: 0 %
Lymphocytes Relative: 68 %
Lymphs Abs: 0.2 10*3/uL — ABNORMAL LOW (ref 0.7–4.0)
MCH: 31.3 pg (ref 26.0–34.0)
MCHC: 33.3 g/dL (ref 30.0–36.0)
MCV: 93.8 fL (ref 80.0–100.0)
Monocytes Absolute: 0 10*3/uL — ABNORMAL LOW (ref 0.1–1.0)
Monocytes Relative: 6 %
Neutro Abs: 0.1 10*3/uL — CL (ref 1.7–7.7)
Neutrophils Relative %: 26 %
Platelet Count: 28 10*3/uL — ABNORMAL LOW (ref 150–400)
RBC: 2.88 MIL/uL — ABNORMAL LOW (ref 4.22–5.81)
RDW: 16.8 % — ABNORMAL HIGH (ref 11.5–15.5)
WBC Count: 0.4 10*3/uL — CL (ref 4.0–10.5)
nRBC: 0 % (ref 0.0–0.2)

## 2021-12-11 LAB — MAGNESIUM: Magnesium: 1.7 mg/dL (ref 1.7–2.4)

## 2021-12-11 LAB — CMP (CANCER CENTER ONLY)
ALT: 63 U/L — ABNORMAL HIGH (ref 0–44)
AST: 37 U/L (ref 15–41)
Albumin: 3.3 g/dL — ABNORMAL LOW (ref 3.5–5.0)
Alkaline Phosphatase: 113 U/L (ref 38–126)
Anion gap: 9 (ref 5–15)
BUN: 24 mg/dL — ABNORMAL HIGH (ref 8–23)
CO2: 26 mmol/L (ref 22–32)
Calcium: 9.1 mg/dL (ref 8.9–10.3)
Chloride: 107 mmol/L (ref 98–111)
Creatinine: 0.77 mg/dL (ref 0.61–1.24)
GFR, Estimated: >60 mL/min (ref >60)
Glucose, Bld: 98 mg/dL (ref 70–99)
Potassium: 3.8 mmol/L (ref 3.5–5.1)
Sodium: 142 mmol/L (ref 135–145)
Total Bilirubin: 1.6 mg/dL — ABNORMAL HIGH (ref 0.3–1.2)
Total Protein: 6.6 g/dL (ref 6.5–8.1)

## 2021-12-11 LAB — SAMPLE TO BLOOD BANK

## 2021-12-11 MED ORDER — HEPARIN SOD (PORK) LOCK FLUSH 100 UNIT/ML IV SOLN
250.0000 [IU] | Freq: Once | INTRAVENOUS | Status: AC
  Administered 2021-12-11: 250 [IU] via INTRAVENOUS

## 2021-12-11 MED ORDER — SODIUM CHLORIDE 0.9% FLUSH
10.0000 mL | Freq: Once | INTRAVENOUS | Status: AC
  Administered 2021-12-11: 10 mL via INTRAVENOUS

## 2021-12-11 NOTE — Telephone Encounter (Signed)
Called CBC results to [name] at [facility]: ANC 0.1 and WBC 0.4. No fever or s/s of infection. His Hgb is 9.0, but he feels well. Total bili stable at 1.6. Faxed results to [phone] with message that we did not order antibiotic--will defer that to [facility] provider. Did review neutropenic precautions with [name] and to call for fever or s/s of infection.

## 2021-12-11 NOTE — Progress Notes (Signed)
Patient denies signs/symptoms of infection or bleeding. PICC line dressing changed and flushed. [name] reviewed lab results with patient including follow up appointments.

## 2021-12-11 NOTE — Addendum Note (Signed)
Addended by: Tania Ade on: 12/11/2021 02:59 PM   Modules accepted: Orders

## 2021-12-11 NOTE — Telephone Encounter (Signed)
Noted Dr. Neysa Bonito response in [place]. Starting him on antibiotics and requesting repeat CBC/diff on 7/17. Order placed and high priority scheduling message sent for lab/PICC flush on 7/17

## 2021-12-15 ENCOUNTER — Inpatient Hospital Stay: Payer: Medicare Other

## 2021-12-15 ENCOUNTER — Encounter: Payer: Self-pay | Admitting: *Deleted

## 2021-12-15 VITALS — BP 112/68 | HR 75 | Temp 98.0°F | Resp 18

## 2021-12-15 DIAGNOSIS — J189 Pneumonia, unspecified organism: Secondary | ICD-10-CM | POA: Diagnosis present

## 2021-12-15 DIAGNOSIS — Z79899 Other long term (current) drug therapy: Secondary | ICD-10-CM | POA: Diagnosis not present

## 2021-12-15 DIAGNOSIS — Z452 Encounter for adjustment and management of vascular access device: Secondary | ICD-10-CM

## 2021-12-15 DIAGNOSIS — R5081 Fever presenting with conditions classified elsewhere: Secondary | ICD-10-CM | POA: Diagnosis not present

## 2021-12-15 DIAGNOSIS — I959 Hypotension, unspecified: Secondary | ICD-10-CM | POA: Diagnosis not present

## 2021-12-15 DIAGNOSIS — C9200 Acute myeloblastic leukemia, not having achieved remission: Secondary | ICD-10-CM

## 2021-12-15 DIAGNOSIS — Z20822 Contact with and (suspected) exposure to covid-19: Secondary | ICD-10-CM | POA: Diagnosis not present

## 2021-12-15 DIAGNOSIS — D709 Neutropenia, unspecified: Secondary | ICD-10-CM | POA: Diagnosis not present

## 2021-12-15 DIAGNOSIS — I4891 Unspecified atrial fibrillation: Secondary | ICD-10-CM | POA: Diagnosis not present

## 2021-12-15 DIAGNOSIS — Z888 Allergy status to other drugs, medicaments and biological substances status: Secondary | ICD-10-CM | POA: Diagnosis not present

## 2021-12-15 DIAGNOSIS — Z91018 Allergy to other foods: Secondary | ICD-10-CM | POA: Diagnosis not present

## 2021-12-15 DIAGNOSIS — Z952 Presence of prosthetic heart valve: Secondary | ICD-10-CM | POA: Diagnosis not present

## 2021-12-15 DIAGNOSIS — D61818 Other pancytopenia: Secondary | ICD-10-CM | POA: Diagnosis not present

## 2021-12-15 LAB — CBC WITH DIFFERENTIAL (CANCER CENTER ONLY)
Abs Immature Granulocytes: 0 10*3/uL (ref 0.00–0.07)
Basophils Absolute: 0 10*3/uL (ref 0.0–0.1)
Basophils Relative: 0 %
Eosinophils Absolute: 0 10*3/uL (ref 0.0–0.5)
Eosinophils Relative: 0 %
HCT: 28.3 % — ABNORMAL LOW (ref 39.0–52.0)
Hemoglobin: 9.5 g/dL — ABNORMAL LOW (ref 13.0–17.0)
Immature Granulocytes: 0 %
Lymphocytes Relative: 82 %
Lymphs Abs: 0.2 10*3/uL — ABNORMAL LOW (ref 0.7–4.0)
MCH: 31.7 pg (ref 26.0–34.0)
MCHC: 33.6 g/dL (ref 30.0–36.0)
MCV: 94.3 fL (ref 80.0–100.0)
Monocytes Absolute: 0 10*3/uL — ABNORMAL LOW (ref 0.1–1.0)
Monocytes Relative: 0 %
Neutro Abs: 0.1 10*3/uL — CL (ref 1.7–7.7)
Neutrophils Relative %: 18 %
Platelet Count: 40 10*3/uL — ABNORMAL LOW (ref 150–400)
RBC: 3 MIL/uL — ABNORMAL LOW (ref 4.22–5.81)
RDW: 17.3 % — ABNORMAL HIGH (ref 11.5–15.5)
Smear Review: NORMAL
WBC Count: 0.3 10*3/uL — CL (ref 4.0–10.5)
nRBC: 0 % (ref 0.0–0.2)

## 2021-12-15 MED ORDER — HEPARIN SOD (PORK) LOCK FLUSH 100 UNIT/ML IV SOLN
250.0000 [IU] | Freq: Once | INTRAVENOUS | Status: AC
  Administered 2021-12-15: 250 [IU] via INTRAVENOUS

## 2021-12-15 MED ORDER — SODIUM CHLORIDE 0.9% FLUSH
10.0000 mL | Freq: Once | INTRAVENOUS | Status: AC
  Administered 2021-12-15: 10 mL via INTRAVENOUS

## 2021-12-15 NOTE — Patient Instructions (Signed)
PICC Home Care Guide A peripherally inserted central catheter (PICC) is a form of IV access that allows medicines and IV fluids to be quickly put into the blood and spread throughout the body. The PICC is a long, thin, flexible tube (catheter) that is put into a vein in a person's arm or leg. The catheter ends in a large vein just outside the heart called the superior vena cava (SVC). After the PICC is put in, a chest X-ray may be done to make sure that it is in the right place. A PICC may be placed for different reasons, such as: To give medicines and liquid nutrition. To give IV fluids and blood products. To take blood samples often. If there is trouble placing a peripheral intravenous (PIV) catheter. If cared for properly, a PICC can remain in place for many months. Having a PICC can allow you to go home from the hospital sooner and continue treatment at home. Medicines and PICC care can be managed at home by a family member, caregiver, or home health care team. What are the risks? Generally, having a PICC is safe. However, problems may occur, including: A blood clot (thrombus) forming in or at the end of the PICC. A blood clot forming in a vein (deep vein thrombosis) or traveling to the lung (pulmonary embolism). Inflammation of the vein (phlebitis) in which the PICC is placed. Infection at the insertion site or in the blood. Blood infections from central lines, like PICCs, can be serious and often require a hospital stay. PICC malposition, or PICC movement or poor placement. A break or cut in the PICC. Do not use scissors near the PICC. Nerve or tendon irritation or injury during PICC insertion. How to care for your PICC Please follow the specific guidelines provided by your health care provider. Preventing infection You and any caregivers should wash your hands often with soap and water for at least 20 seconds. Wash hands: Before touching the PICC or the infusion device. Before changing a  bandage (dressing). Do not change the dressing unless you have been taught to do so and have shown you are able to change it safely. Flush the PICC as told. Tell your health care provider right away if the PICC is hard to flush or does not flush. Do not use force to flush the PICC. Use clean and germ-free (sterile) supplies only. Keep the supplies in a dry place. Do not reuse needles, syringes, or any other supplies. Reusing supplies can lead to infection. Keep the PICC dressing dry and secure it with tape if the edges stop sticking to your skin. Check your PICC insertion site every day for signs of infection. Check for: Redness, swelling, or pain. Fluid or blood. Warmth. Pus or a bad smell. Preventing other problems Do not use a syringe that is less than 10 mL to flush the PICC. Do not have your blood pressure checked on the arm in which the PICC is placed. Do not ever pull or tug on the PICC. Keep it secured to your arm with tape or a stretch wrap when not in use. Do not take the PICC out yourself. Only a trained health care provider should remove the PICC. Keep pets and children away from your PICC. How to care for your PICC dressing Keep your PICC dressing clean and dry to prevent infection. Do not take baths, swim, or use a hot tub until your health care provider approves. Ask your health care provider if you can take   showers. You may only be allowed to take sponge baths. When you are allowed to shower: Ask your health care provider to teach you how to wrap the PICC. Cover the PICC with clear plastic wrap and tape to keep it dry while showering. Follow instructions from your health care provider about how to take care of your insertion site and dressing. Make sure you: Wash your hands with soap and water for at least 20 seconds before and after you change your dressing. If soap and water are not available, use hand sanitizer. Change your dressing only if taught to do so by your health care  provider. Your PICC dressing needs to be changed if it becomes loose or wet. Leave stitches (sutures), skin glue, or adhesive strips in place. These skin closures may need to stay in place for 2 weeks or longer. If adhesive strip edges start to loosen and curl up, you may trim the loose edges. Do not remove adhesive strips completely unless your health care provider tells you to do that. Follow these instructions at home: Disposal of supplies Throw away any syringes in a disposal container that is meant for sharp items (sharps container). You can buy a sharps container from a pharmacy, or you can make one by using an empty, hard plastic bottle with a lid. Place any used dressings or infusion bags into a plastic bag. Throw that bag in the trash. General instructions  Always carry your PICC identification card or wear a medical alert bracelet. Keep the tube clamped at all times, unless it is being used. Always carry a smooth-edge clamp with you to clamp the PICC if it breaks. Do not use scissors or sharp objects near the tube. You may bend your arm and move it freely. If your PICC is near or at the bend of your elbow, avoid activity with repeated motion at the elbow. Avoid lifting heavy objects as told by your health care provider. Keep all follow-up visits. This is important. You will need to have your PICC dressing changed at least once a week. Contact a health care provider if: You have pain in your arm, ear, face, or teeth. You have a fever or chills. You have redness, swelling, or pain around the insertion site. You have fluid or blood coming from the insertion site. Your insertion site feels warm to the touch. You have pus or a bad smell coming from the insertion site. Your skin feels hard and raised around the insertion site. Your PICC dressing has gotten wet or is coming off and you have not been taught how to change it. Get help right away if: You have problems with your PICC, such as  your PICC: Was tugged or pulled and has partially come out. Do not  push the PICC back in. Cannot be flushed, is hard to flush, or leaks around the insertion site when it is flushed. Makes a flushing sound when it is flushed. Appears to have a hole or tear. Is accidentally pulled all the way out. If this happens, cover the insertion site with a gauze dressing. Do not throw the PICC away. Your health care provider will need to check it to be sure the entire catheter came out. You feel your heart racing or skipping beats, or you have chest pain. You have shortness of breath or trouble breathing. You have swelling, redness, warmth, or pain in the arm in which the PICC is placed. You have a red streak going up your arm that   starts under the PICC dressing. These symptoms may be an emergency. Get help right away. Call 911. Do not wait to see if the symptoms will go away. Do not drive yourself to the hospital. Summary A peripherally inserted central catheter (PICC) is a long, thin, flexible tube (catheter) that is put into a vein in the arm or leg. If cared for properly, a PICC can remain in place for many months. Having a PICC can allow you to go home from the hospital sooner and continue treatment at home. The PICC is inserted using a germ-free (sterile) technique by a specially trained health care provider. Only a trained health care provider should remove it. Do not have your blood pressure checked on the arm in which your PICC is placed. Always keep your PICC identification card with you. This information is not intended to replace advice given to you by your health care provider. Make sure you discuss any questions you have with your health care provider. Document Revised: 12/04/2020 Document Reviewed: 12/04/2020 Elsevier Patient Education  2023 Elsevier Inc.  

## 2021-12-15 NOTE — Progress Notes (Signed)
CRITICAL VALUE STICKER  CRITICAL VALUE: WBC 0.3; ANC 0.1; platelets 40,000  RECEIVER (on-site recipient of call):Jagjit Riner,RN  DATE & TIME NOTIFIED: 12/15/21 @ 1202  MESSENGER (representative from lab):Simona Huh  MD NOTIFIED: Dr. Benay Spice  TIME OF NOTIFICATION:1210  RESPONSE: F/U on 7/20 as scheduled. Continue antibiotics per [facility].  Faxed CBC results to [facility] [phone] and called to [facility]  in triage.

## 2021-12-16 ENCOUNTER — Other Ambulatory Visit: Payer: Self-pay | Admitting: Family Medicine

## 2021-12-18 ENCOUNTER — Emergency Department (HOSPITAL_COMMUNITY)
Admission: EM | Admit: 2021-12-18 | Discharge: 2021-12-18 | DRG: 194 | Disposition: A | Discharge status: Other Acute Inpatient Hospital | Payer: Medicare Other | Attending: Internal Medicine | Admitting: Internal Medicine

## 2021-12-18 ENCOUNTER — Other Ambulatory Visit: Payer: Self-pay

## 2021-12-18 ENCOUNTER — Encounter: Payer: Self-pay | Admitting: *Deleted

## 2021-12-18 ENCOUNTER — Emergency Department (HOSPITAL_COMMUNITY): Payer: Medicare Other

## 2021-12-18 ENCOUNTER — Inpatient Hospital Stay: Payer: Medicare Other

## 2021-12-18 ENCOUNTER — Encounter (HOSPITAL_COMMUNITY): Payer: Self-pay | Admitting: *Deleted

## 2021-12-18 DIAGNOSIS — Z888 Allergy status to other drugs, medicaments and biological substances status: Secondary | ICD-10-CM

## 2021-12-18 DIAGNOSIS — Z91018 Allergy to other foods: Secondary | ICD-10-CM

## 2021-12-18 DIAGNOSIS — J9601 Acute respiratory failure with hypoxia: Secondary | ICD-10-CM | POA: Diagnosis present

## 2021-12-18 DIAGNOSIS — J189 Pneumonia, unspecified organism: Secondary | ICD-10-CM | POA: Diagnosis present

## 2021-12-18 DIAGNOSIS — E876 Hypokalemia: Secondary | ICD-10-CM

## 2021-12-18 DIAGNOSIS — Z20822 Contact with and (suspected) exposure to covid-19: Secondary | ICD-10-CM | POA: Diagnosis present

## 2021-12-18 DIAGNOSIS — D61818 Other pancytopenia: Secondary | ICD-10-CM | POA: Diagnosis present

## 2021-12-18 DIAGNOSIS — I959 Hypotension, unspecified: Secondary | ICD-10-CM | POA: Diagnosis present

## 2021-12-18 DIAGNOSIS — C9200 Acute myeloblastic leukemia, not having achieved remission: Secondary | ICD-10-CM | POA: Diagnosis not present

## 2021-12-18 DIAGNOSIS — R5081 Fever presenting with conditions classified elsewhere: Secondary | ICD-10-CM | POA: Diagnosis not present

## 2021-12-18 DIAGNOSIS — I4891 Unspecified atrial fibrillation: Secondary | ICD-10-CM | POA: Diagnosis not present

## 2021-12-18 DIAGNOSIS — Z952 Presence of prosthetic heart valve: Secondary | ICD-10-CM

## 2021-12-18 DIAGNOSIS — I4892 Unspecified atrial flutter: Secondary | ICD-10-CM | POA: Diagnosis present

## 2021-12-18 DIAGNOSIS — A419 Sepsis, unspecified organism: Secondary | ICD-10-CM | POA: Diagnosis present

## 2021-12-18 DIAGNOSIS — Z79899 Other long term (current) drug therapy: Secondary | ICD-10-CM | POA: Diagnosis not present

## 2021-12-18 DIAGNOSIS — D709 Neutropenia, unspecified: Secondary | ICD-10-CM | POA: Diagnosis not present

## 2021-12-18 DIAGNOSIS — J449 Chronic obstructive pulmonary disease, unspecified: Secondary | ICD-10-CM | POA: Diagnosis present

## 2021-12-18 DIAGNOSIS — C189 Malignant neoplasm of colon, unspecified: Secondary | ICD-10-CM | POA: Diagnosis present

## 2021-12-18 LAB — COMPREHENSIVE METABOLIC PANEL
ALT: 81 U/L — ABNORMAL HIGH (ref 0–44)
AST: 82 U/L — ABNORMAL HIGH (ref 15–41)
Albumin: 2.7 g/dL — ABNORMAL LOW (ref 3.5–5.0)
Alkaline Phosphatase: 126 U/L (ref 38–126)
Anion gap: 10 (ref 5–15)
BUN: 17 mg/dL (ref 8–23)
CO2: 23 mmol/L (ref 22–32)
Calcium: 8.3 mg/dL — ABNORMAL LOW (ref 8.9–10.3)
Chloride: 106 mmol/L (ref 98–111)
Creatinine, Ser: 1.13 mg/dL (ref 0.61–1.24)
GFR, Estimated: >60 mL/min (ref >60)
Glucose, Bld: 105 mg/dL — ABNORMAL HIGH (ref 70–99)
Potassium: 3.1 mmol/L — ABNORMAL LOW (ref 3.5–5.1)
Sodium: 139 mmol/L (ref 135–145)
Total Bilirubin: 2.1 mg/dL — ABNORMAL HIGH (ref 0.3–1.2)
Total Protein: 6.5 g/dL (ref 6.5–8.1)

## 2021-12-18 LAB — URINALYSIS, ROUTINE W REFLEX MICROSCOPIC
Bilirubin Urine: NEGATIVE
Glucose, UA: NEGATIVE mg/dL
Ketones, ur: NEGATIVE mg/dL
Leukocytes,Ua: NEGATIVE
Nitrite: NEGATIVE
Protein, ur: 30 mg/dL — AB
Specific Gravity, Urine: 1.01 (ref 1.005–1.030)
pH: 7 (ref 5.0–8.0)

## 2021-12-18 LAB — TROPONIN I (HIGH SENSITIVITY)
Troponin I (High Sensitivity): 18 ng/L — ABNORMAL HIGH (ref <18)
Troponin I (High Sensitivity): 19 ng/L — ABNORMAL HIGH (ref <18)

## 2021-12-18 LAB — CBC WITH DIFFERENTIAL/PLATELET
Abs Immature Granulocytes: 0.02 10*3/uL (ref 0.00–0.07)
Basophils Absolute: 0 10*3/uL (ref 0.0–0.1)
Basophils Relative: 8 %
Eosinophils Absolute: 0 10*3/uL (ref 0.0–0.5)
Eosinophils Relative: 8 %
HCT: 28.8 % — ABNORMAL LOW (ref 39.0–52.0)
Hemoglobin: 9.6 g/dL — ABNORMAL LOW (ref 13.0–17.0)
Immature Granulocytes: 17 %
Lymphocytes Relative: 67 %
Lymphs Abs: 0.1 10*3/uL — ABNORMAL LOW (ref 0.7–4.0)
MCH: 32.1 pg (ref 26.0–34.0)
MCHC: 33.3 g/dL (ref 30.0–36.0)
MCV: 96.3 fL (ref 80.0–100.0)
Monocytes Absolute: 0 10*3/uL — ABNORMAL LOW (ref 0.1–1.0)
Monocytes Relative: 0 %
Neutro Abs: 0 10*3/uL — CL (ref 1.7–7.7)
Neutrophils Relative %: 0 %
Platelets: 46 10*3/uL — ABNORMAL LOW (ref 150–400)
RBC: 2.99 MIL/uL — ABNORMAL LOW (ref 4.22–5.81)
RDW: 18.5 % — ABNORMAL HIGH (ref 11.5–15.5)
WBC: 0.1 10*3/uL — CL (ref 4.0–10.5)
nRBC: 0 % (ref 0.0–0.2)

## 2021-12-18 LAB — SARS CORONAVIRUS 2 BY RT PCR: SARS Coronavirus 2 by RT PCR: NEGATIVE

## 2021-12-18 LAB — LIPASE, BLOOD: Lipase: 20 U/L (ref 11–51)

## 2021-12-18 LAB — LACTIC ACID, PLASMA
Lactic Acid, Venous: 2.8 mmol/L (ref 0.5–1.9)
Lactic Acid, Venous: 1.5 mmol/L (ref 0.5–1.9)

## 2021-12-18 LAB — APTT: aPTT: 40 seconds — ABNORMAL HIGH (ref 24–36)

## 2021-12-18 LAB — PROTIME-INR
INR: 1.7 — ABNORMAL HIGH (ref 0.8–1.2)
Prothrombin Time: 19.3 seconds — ABNORMAL HIGH (ref 11.4–15.2)

## 2021-12-18 LAB — MAGNESIUM: Magnesium: 1.8 mg/dL (ref 1.7–2.4)

## 2021-12-18 MED ORDER — VANCOMYCIN HCL IN DEXTROSE 1-5 GM/200ML-% IV SOLN: 1000.0000 mg | INTRAVENOUS | Status: DC

## 2021-12-18 MED ORDER — SODIUM CHLORIDE 0.9% FLUSH: 10.0000 mL | Freq: Two times a day (BID) | INTRAVENOUS | Status: DC

## 2021-12-18 MED ORDER — ACETAMINOPHEN 325 MG PO TABS
650.0000 mg | ORAL_TABLET | Freq: Once | ORAL | Status: AC
  Administered 2021-12-18: 650 mg via ORAL
  Filled 2021-12-18: qty 2

## 2021-12-18 MED ORDER — ALBUTEROL SULFATE (2.5 MG/3ML) 0.083% IN NEBU
2.5000 mg | INHALATION_SOLUTION | RESPIRATORY_TRACT | Status: DC | PRN
  Administered 2021-12-18: 2.5 mg via RESPIRATORY_TRACT
  Filled 2021-12-18: qty 3

## 2021-12-18 MED ORDER — ONDANSETRON HCL 4 MG/2ML IJ SOLN
4.0000 mg | Freq: Once | INTRAMUSCULAR | Status: AC
Start: 2021-12-18 — End: 2021-12-18
  Administered 2021-12-18: 4 mg via INTRAVENOUS
  Filled 2021-12-18: qty 2

## 2021-12-18 MED ORDER — VANCOMYCIN HCL 1500 MG/300ML IV SOLN
1500.0000 mg | Freq: Once | INTRAVENOUS | Status: AC
  Administered 2021-12-18: 1500 mg via INTRAVENOUS
  Filled 2021-12-18: qty 300

## 2021-12-18 MED ORDER — LACTATED RINGERS IV BOLUS
1000.0000 mL | Freq: Once | INTRAVENOUS | Status: AC
  Administered 2021-12-18: 1000 mL via INTRAVENOUS

## 2021-12-18 MED ORDER — POTASSIUM CHLORIDE 10 MEQ/100ML IV SOLN
10.0000 meq | INTRAVENOUS | Status: AC
  Administered 2021-12-18 (×2): 10 meq via INTRAVENOUS
  Filled 2021-12-18 (×2): qty 100

## 2021-12-18 MED ORDER — LACTATED RINGERS IV BOLUS (SEPSIS)
1000.0000 mL | Freq: Once | INTRAVENOUS | Status: AC
  Administered 2021-12-18: 1000 mL via INTRAVENOUS

## 2021-12-18 MED ORDER — SODIUM CHLORIDE 0.9 % IV SOLN
2.0000 g | Freq: Two times a day (BID) | INTRAVENOUS | Status: DC
  Administered 2021-12-18: 2 g via INTRAVENOUS
  Filled 2021-12-18: qty 12.5

## 2021-12-18 MED ORDER — CEFEPIME HCL 2 G IV SOLR
2.0000 g | Freq: Once | INTRAVENOUS | Status: DC
Start: 2021-12-18 — End: 2021-12-18

## 2021-12-18 MED ORDER — SODIUM CHLORIDE 0.9% FLUSH: 10.0000 mL | INTRAVENOUS | Status: DC | PRN

## 2021-12-18 MED ORDER — ACETAMINOPHEN 500 MG PO TABS
1000.0000 mg | ORAL_TABLET | Freq: Four times a day (QID) | ORAL | Status: DC | PRN
Start: 2021-12-18 — End: 2021-12-19

## 2021-12-18 MED ORDER — CEFTRIAXONE SODIUM 1 G IJ SOLR
1.0000 g | Freq: Once | INTRAMUSCULAR | Status: AC
  Administered 2021-12-18: 1 g via INTRAVENOUS
  Filled 2021-12-18: qty 10

## 2021-12-18 MED ORDER — CHLORHEXIDINE GLUCONATE CLOTH 2 % EX PADS
6.0000 | MEDICATED_PAD | Freq: Every day | CUTANEOUS | Status: DC
Start: 2021-12-18 — End: 2021-12-19

## 2021-12-18 MED ORDER — AZITHROMYCIN 500 MG IV SOLR
500.0000 mg | Freq: Once | INTRAVENOUS | Status: AC
Start: 2021-12-18 — End: 2021-12-18
  Administered 2021-12-18: 500 mg via INTRAVENOUS
  Filled 2021-12-18: qty 5

## 2021-12-18 MED ORDER — LACTATED RINGERS IV SOLN
INTRAVENOUS | Status: DC
Start: 2021-12-18 — End: 2021-12-19

## 2021-12-19 ENCOUNTER — Telehealth (HOSPITAL_BASED_OUTPATIENT_CLINIC_OR_DEPARTMENT_OTHER): Payer: Self-pay | Admitting: Emergency Medicine

## 2021-12-19 LAB — URINE CULTURE

## 2021-12-19 NOTE — Telephone Encounter (Signed)
Received call from lab with positive blood culture results. Consulted with Dr. Earl Lites who advised pt was currently admitted to [facility] and no further action was needed.

## 2021-12-20 LAB — BLOOD CULTURE ID PANEL (REFLEXED) - BCID2

## 2021-12-21 LAB — CULTURE, BLOOD (ROUTINE X 2): Special Requests: ADEQUATE

## 2021-12-22 ENCOUNTER — Other Ambulatory Visit: Payer: Self-pay

## 2021-12-22 ENCOUNTER — Encounter: Payer: Self-pay | Admitting: *Deleted

## 2021-12-22 NOTE — Progress Notes (Signed)
Spoke w/triage nurse at [facility] and confirmed that Mr. Woodcox died there over weekend. MD notified.

## 2021-12-25 ENCOUNTER — Inpatient Hospital Stay: Payer: Medicare Other

## 2021-12-30 ENCOUNTER — Other Ambulatory Visit: Payer: Self-pay

## 2021-12-30 NOTE — ED Notes (Signed)
Increased oxygent to [facility] Dr Posey Pronto messaged about patients work of breathing in creased need for oxygen

## 2021-12-30 NOTE — ED Notes (Signed)
Increased oxygen to 4L [place] due to desat 88%

## 2021-12-30 NOTE — Progress Notes (Addendum)
Pharmacy Antibiotic Note  [name] is a 76 y.o. male admitted on January 15, 2022 with febrile neutropenia. Patient with PICC line in place for chemotherapy. He was to get PICC dressing changed today but was found by family outside waiting for transport. Oncology consulted in the ED. Pharmacy has been consulted for vancomycin and cefepime dosing for febrile neutropenia.  January 15, 2022 -Ceftriaxone and azithromycin initially given in the ED -Temp 103.6, ANC 0.0  Plan: -Follow vancomycin 1500 mg IV x 1 with 1000 mg IV q24h -Cefepime 2 g IV q12h -Pharmacy to continue to follow renal function, cultures and clinical progress for dose adjustments and de-escalation as indicated  Height: 6' (182.9 cm) Weight: 62.1 kg (137 lb) IBW/kg (Calculated) : 77.6  Temp (24hrs), Avg:101.1 F (38.4 C), Min:98.6 F (37 C), Max:103.6 F (39.8 C)  Recent Labs  Lab 12/15/21 1125 15-Jan-2022 1240  WBC 0.3* 0.1*  CREATININE  --  1.13  LATICACIDVEN  --  2.8*    Estimated Creatinine Clearance: 48.1 mL/min (by C-G formula based on SCr of 1.13 mg/dL).    Allergies  Allergen Reactions   Orange Juice [Orange Oil] Hives   Synephrine Dermatitis    Antimicrobials this admission: Cefepime 7/20 >> Vancomycin 7/20 >> Ceftriaxone/azithromycin 7/20 x 1  Dose adjustments this admission: NA  Microbiology results: 7/20 BCx: pending 7/20 UCx: pending    Thank you for allowing pharmacy to be a part of this patient's care.  Tawnya Crook, PharmD, BCPS Clinical Pharmacist 2022/01/15 3:25 PM

## 2021-12-30 NOTE — ED Provider Notes (Signed)
We called [facility] at around [phone] and they stated they did not have any beds available and did not expect to have any beds available until tomorrow and possibly later.  We arranged for admission.  Before the patient was admitted at approximately [place] called back and said they had a bed available.  They will send their transport team over to get the patient   Milton Ferguson, MD December 23, 2021 1926

## 2021-12-30 NOTE — ED Triage Notes (Signed)
BIB EMS after family found him outside sitting in a chair waiting on a ride x 1 hr.  Initial BP 80s SBP and HR 120.  Patient has no complaints other than weakness.  Has PICC in right arm.  NS 400cc given by EMS.  Cancer patient. Was suppose to have PICC like dressing change today. Patient has colostomy and family placed a new bag on. Last VS 118/58 HR112 RR 24 O2 98 CBG 113

## 2021-12-30 NOTE — ED Notes (Signed)
Dr Posey Pronto advised to pause fluids and K at this time due to increased WOB and decreased sats and lung sounds with crackles throughout all llung fields.

## 2021-12-30 NOTE — ED Provider Notes (Signed)
[facility] DEPT Provider Note   CSN: [id number] Arrival date & time: Jan 08, 2022  1216     History  Chief Complaint  Patient presents with   Weakness    [name] is a 76 y.o. male.  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  He also has a history of A-fib, mitral valve replacement.  He was found outside by family after sitting there waiting for transport to get his PICC line dressing change.  He may have been out there for 30 to 60 minutes.  He is complaining of feeling nauseous.  Weak.  He denies any chest pain or shortness of breath.  He denies any abdominal pain.  He was initially hypotensive and tachycardic by EMS and given IV fluids.  He is a very poor historian.  The history is provided by the patient and the EMS personnel.  Weakness Severity:  Moderate Onset quality:  Gradual Timing:  Constant Progression:  Unchanged Chronicity:  New Relieved by:  None tried Worsened by:  Activity Ineffective treatments:  None tried Associated symptoms: nausea   Associated symptoms: no abdominal pain, no chest pain, no cough, no diarrhea, no fever, no headaches, no shortness of breath and no vomiting        Home Medications Prior to Admission medications   Medication Sig Start Date End Date Taking? Authorizing Provider  acetaminophen (TYLENOL) 325 MG tablet Take 2 tablets (650 mg total) by mouth every 6 (six) hours as needed for mild pain or fever. 11/01/20   Norm Parcel, PA-C  acyclovir (ZOVIRAX) 400 MG tablet Take 400 mg by mouth 2 (two) times daily. 10/03/21   [provider]  albuterol (VENTOLIN HFA) 108 (90 Base) MCG/ACT inhaler Inhale 1-2 puffs into the lungs every 6 (six) hours as needed for wheezing or shortness of breath. Patient not taking: Reported on 08/25/2021 11/01/20   Modena Jansky, MD  ascorbic acid (VITAMIN C) 1000 MG tablet Take 1 tablet by mouth daily.    [provider]   digoxin (LANOXIN) 0.125 MG tablet Take 1 tablet (0.125 mg total) by mouth daily. Patient not taking: Reported on 08/25/2021 07/24/21   Zenia Resides, MD  diltiazem (CARDIZEM CD) 120 MG 24 hr capsule TAKE 1 CAPSULE(120 MG) BY MOUTH AT BEDTIME 09/29/21   Zenia Resides, MD  diltiazem (TIAZAC) 120 MG 24 hr capsule Take by mouth. 10/03/21   [provider]  Ferrous Sulfate (IRON) 90 (18 Fe) MG TABS SMARTSIG:1 Tablet(s) By Mouth Patient not taking: Reported on 07/28/2021 02/07/21   [provider]  levofloxacin (LEVAQUIN) 500 MG tablet Take 1 tablet (500 mg total) by mouth daily. 08/25/21   Owens Shark, NP  metoprolol tartrate (LOPRESSOR) 25 MG tablet Take 25 mg by mouth 2 (two) times daily. 08/25/21   [provider]  Multiple Vitamin (QUINTABS) TABS Take 1 tablet by mouth daily. 10/03/21   [provider]  ODOMZO 200 MG capsule Take 200 mg by mouth daily. 06/23/21   [provider]  ondansetron (ZOFRAN) 8 MG tablet Take 8 mg by mouth 3 (three) times daily. Patient not taking: Reported on 11/13/2021 10/03/21   [provider]  ondansetron (ZOFRAN) 8 MG tablet Take by mouth. 10/03/21   [provider]  pantoprazole (PROTONIX) 40 MG tablet Take 1 tablet (40 mg total) by mouth daily at 6 (six) AM. Patient not taking: Reported on 06/13/2021 01/29/21  Zenia Resides, MD  pantoprazole (PROTONIX) 40 MG tablet Take 1 tablet by mouth daily. 10/04/21   [provider]  polyethylene glycol powder (GLYCOLAX/MIRALAX) 17 GM/SCOOP powder Take by mouth.    [provider]  rosuvastatin (CRESTOR) 20 MG tablet Take 1 tablet (20 mg total) by mouth daily at 6 PM. 07/24/21   Hensel, Jamal Collin, MD  SPIRIVA HANDIHALER 18 MCG inhalation capsule PLACE ONE CAPSULE INTO THE INHALER AND INHALE DAILY Patient not taking: Reported on 09/25/2021 03/04/21   Zenia Resides, MD  venetoclax (VENCLEXTA) 100 MG tablet Take 100 mg by mouth daily. 10/03/21   [provider]      Allergies    Orange juice [orange oil] and Synephrine    Review of Systems   Review of Systems  Constitutional:  Negative for fever.  Respiratory:  Negative for cough and shortness of breath.   Cardiovascular:  Negative for chest pain.  Gastrointestinal:  Positive for nausea. Negative for abdominal pain, diarrhea and vomiting.  Neurological:  Positive for weakness. Negative for headaches.    Physical Exam Updated Vital Signs BP 111/65 Comment: 4L [place]  Pulse (!) 126 Comment: 4L [place]  Temp (!) 101.2 F (38.4 C) (Rectal)   Resp (!) 21 Comment: 4L [place]  Ht 6' (1.829 m)   Wt 62.1 kg   SpO2 91% Comment: 4L [place]  BMI 18.58 kg/m  Physical Exam Vitals and nursing note reviewed.  Constitutional:      General: He is not in acute distress.    Appearance: Normal appearance. He is well-developed.  HENT:     Head: Normocephalic and atraumatic.  Eyes:     Conjunctiva/sclera: Conjunctivae normal.  Cardiovascular:     Rate and Rhythm: Regular rhythm. Tachycardia present.     Heart sounds: No murmur heard. Pulmonary:     Effort: Pulmonary effort is normal. No respiratory distress.     Breath sounds: Normal breath sounds.  Abdominal:     Palpations: Abdomen is soft.     Tenderness: There is no abdominal tenderness. There is no guarding or rebound.     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     Right lower leg: Edema present.     Left lower leg: Edema present.     Comments: PICC line right upper arm without signs of erythema.  Skin:    General: Skin is warm and dry.     Capillary Refill: Capillary refill takes less than 2 seconds.  Neurological:     General: No focal deficit present.     ED Results / Procedures / Treatments   Labs (all labs ordered are listed, but only abnormal results are displayed) Labs Reviewed  LACTIC ACID, PLASMA - Abnormal; Notable for the following components:       Result Value   Lactic Acid, Venous 2.8 (*)    All other components within normal limits  COMPREHENSIVE METABOLIC PANEL - Abnormal; Notable for the following components:   Potassium 3.1 (*)    Glucose, Bld 105 (*)    Calcium 8.3 (*)    Albumin 2.7 (*)    AST 82 (*)    ALT 81 (*)    Total Bilirubin 2.1 (*)    All other components within normal limits  CBC WITH DIFFERENTIAL/PLATELET - Abnormal; Notable for the following components:   WBC 0.1 (*)    RBC 2.99 (*)    Hemoglobin 9.6 (*)  HCT 28.8 (*)    RDW 18.5 (*)    Platelets 46 (*)    Neutro Abs 0.0 (*)    Lymphs Abs 0.1 (*)    Monocytes Absolute 0.0 (*)    All other components within normal limits  PROTIME-INR - Abnormal; Notable for the following components:   Prothrombin Time 19.3 (*)    INR 1.7 (*)    All other components within normal limits  APTT - Abnormal; Notable for the following components:   aPTT 40 (*)    All other components within normal limits  URINALYSIS, ROUTINE W REFLEX MICROSCOPIC - Abnormal; Notable for the following components:   Hgb urine dipstick SMALL (*)    Protein, ur 30 (*)    Bacteria, UA RARE (*)    All other components within normal limits  TROPONIN I (HIGH SENSITIVITY) - Abnormal; Notable for the following components:   Troponin I (High Sensitivity) 18 (*)    All other components within normal limits  TROPONIN I (HIGH SENSITIVITY) - Abnormal; Notable for the following components:   Troponin I (High Sensitivity) 19 (*)    All other components within normal limits  CULTURE, BLOOD (ROUTINE X 2)  CULTURE, BLOOD (ROUTINE X 2)  URINE CULTURE  LACTIC ACID, PLASMA  LIPASE, BLOOD  CREATININE, SERUM  MAGNESIUM  TYPE AND SCREEN    EKG EKG Interpretation  Date/Time:  Dec 24, 2021 12:38:51 EDT Ventricular Rate:  121 PR Interval:    QRS Duration: 108 QT Interval:  285 QTC Calculation: 408 R Axis:   77 Text Interpretation: Atrial flutter with varied AV block vs sinus Anteroseptal  infarct, age indeterminate No significant change since prior 5/23 Confirmed by Aletta Edouard [phone]) on 2021-12-24 1:05:34 PM  Radiology DG Chest Port 1 View  Result Date: December 24, 2021 CLINICAL DATA:  Questionable sepsis - evaluate for abnormality. Weakness, cough, shortness of breath, and congestion. EXAM: PORTABLE CHEST 1 VIEW COMPARISON:  Chest radiographs 10/16/2021 FINDINGS: A right upper extremity PICC remains in place, terminating over the mid upper SVC. Prior median sternotomy is again noted. The cardiomediastinal silhouette is unchanged with normal heart size. There is increased pulmonary vascular congestion. There is new/progressive airspace consolidation in the right upper lobe. A small right pleural effusion is unchanged. No pneumothorax is identified. No acute osseous abnormality is seen. IMPRESSION: 1. Right upper lobe consolidation concerning for pneumonia. Followup PA and lateral chest X-ray is recommended in 3-4 weeks following trial of antibiotic therapy to ensure resolution and exclude underlying malignancy. 2. Increased pulmonary vascular congestion. Electronically Signed   By: Logan Bores M.D.   On: 2021-12-24 13:47    Procedures .Critical Care  Performed by: Hayden Rasmussen, MD Authorized by: Hayden Rasmussen, MD   Critical care provider statement:    Critical care time (minutes):  45   Critical care time was exclusive of:  Separately billable procedures and treating other patients   Critical care was necessary to treat or prevent imminent or life-threatening deterioration of the following conditions:  Sepsis   Critical care was time spent personally by me on the following activities:  Development of treatment plan with patient or surrogate, discussions with consultants, evaluation of patient's response to treatment, examination of patient, obtaining history from patient or surrogate, ordering and performing treatments and interventions, ordering and review of laboratory  studies, ordering and review of radiographic studies, pulse oximetry, re-evaluation of patient's condition and review of old charts   I assumed direction of critical care for  this patient from another provider in my specialty: no       Medications Ordered in ED Medications  vancomycin (VANCOREADY) IVPB 1500 mg/300 mL (1,500 mg Intravenous New Bag/Given 01/01/2022 1445)  vancomycin (VANCOCIN) IVPB 1000 mg/200 mL premix (has no administration in time range)  ceFEPIme (MAXIPIME) 2 g in sodium chloride 0.9 % 100 mL IVPB (has no administration in time range)  lactated ringers bolus 1,000 mL (has no administration in time range)  lactated ringers infusion (has no administration in time range)  lactated ringers bolus 1,000 mL (0 mLs Intravenous Stopped 01/01/22 1401)  ondansetron (ZOFRAN) injection 4 mg (4 mg Intravenous Given 01-01-2022 1302)  acetaminophen (TYLENOL) tablet 650 mg (650 mg Oral Given 01-Jan-2022 1340)  cefTRIAXone (ROCEPHIN) 1 g in sodium chloride 0.9 % 100 mL IVPB (0 g Intravenous Stopped 01-01-22 1412)  azithromycin (ZITHROMAX) 500 mg in sodium chloride 0.9 % 250 mL IVPB (500 mg Intravenous New Bag/Given 01/01/2022 1408)    ED Course/ Medical Decision Making/ A&P Clinical Course as of 01/01/2022 1527  2022/01/01 Patient's rectal temp was 103.6. [MB]  5956 Chest x-ray showing right upper lobe infiltrate.  Will start antibiotics. [MB]  3875 Discussed with Dr. Alvy Bimler oncology.  She recommends escalating antibiotics to Vanco and cefepime. [MB]  6433 Discussed with patient's son who is listed as primary contact.  He is agreement with trying to get the patient to [facility] but admitted to [facility] long if no beds are available. [MB]  1506 Discussed with heme-onc Dr. Florene Glen at [facility].  He would like the patient admitted to his service at [facility].  Patient is on the wait list but is the first priority. [MB]    Clinical Course User Index [MB] Hayden Rasmussen, MD                            Medical Decision Making Amount and/or Complexity of Data Reviewed Labs: ordered. Radiology: ordered. ECG/medicine tests: ordered.  Risk OTC drugs. Prescription drug management. Decision regarding hospitalization.  This patient complains of general weakness chills; this involves an extensive number of treatment Options and is a complaint that carries with it a high risk of complications and morbidity. The differential includes infection, pneumonia, sepsis, bacteremia, dehydration  I ordered, reviewed and interpreted labs, which included CBC with pancytopenia and absolute neutropenia, chemistries with low potassium, LFTs mildly elevated, INR elevated, lactate normal, urinalysis without signs of infection, troponins mildly elevated need to be trended I ordered medication IV fluids IV antibiotics and reviewed PMP when indicated. I ordered imaging studies which included chest x-ray and I independently    visualized and interpreted imaging which showed right upper lobe infiltrate Additional history obtained from EMS Previous records obtained and reviewed in epic including recent [facility] admission I consulted [facility] oncology Dr. Florene Glen and oncology at [facility] Dr. Alvy Bimler and discussed lab and imaging findings and discussed disposition.  Cardiac monitoring reviewed, sinus tachycardia versus a flutter Social determinants considered, patient with ongoing tobacco use Critical Interventions: Initiation of fluids and IV antibiotics for sepsis  After the interventions stated above, I reevaluated the patient and found patient remains tachycardic with soft blood pressures. Admission and further testing considered, patient will need admission to the hospital.  He is pending a bed at [facility].  His care is signed out to Dr. Roderic Palau to follow-up with [facility] regarding time to transfer.  Ultimately he may end up needing to be  admitted here due to constraints at [facility].          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)  Pneumonia of right upper lobe due to infectious organism    Rx / DC Orders ED Discharge Orders     None         Hayden Rasmussen, MD 16-Jan-2022 [phone]

## 2021-12-30 NOTE — ED Notes (Signed)
Called to give report to [facility] for room C612 patient has been decompensating over last 1-2hrs with oxygen and WOB.  Patient now on 6L [place] sats 87-92% and breathing around 30x a min.  Patient is still tachy and febrile even with tylenol onboard.  Nurse I spoke with was contacting triage to get him a MICU bed as he is not appropriate for the floor.  RT at bedside to give treatment and is going to titrate on salter.  Awaiting call back from transport line to assigned MICU bed

## 2021-12-30 NOTE — Progress Notes (Signed)
Notified triage nurse w/Dr. Joan Mayans of current status in emergency room and critical results. Will f/u tomorrow and call [facility] again with update.

## 2021-12-30 DEATH — deceased

## 2022-01-01 ENCOUNTER — Other Ambulatory Visit: Payer: Self-pay

## 2022-01-01 ENCOUNTER — Inpatient Hospital Stay: Payer: Medicare Other

## 2022-01-01 ENCOUNTER — Inpatient Hospital Stay: Payer: Medicare Other | Admitting: Oncology

## 2022-01-06 ENCOUNTER — Other Ambulatory Visit (HOSPITAL_COMMUNITY): Payer: Self-pay

## 2022-09-03 IMAGING — CT CT ABD-PELV W/O CM
2 of 4 series · 16 of 46 positions shown, 18 images · non-contrast
Comparison: None Available.

CLINICAL DATA: Abdominal pain, acute, nonlocalized hepatitis.
Weakness, tachycardia



[Series 2: abd pel wo · axial · 0.67mm/px · z∈[+827,+1202]mm · 13 of 83 slices shown, 15 images]
[im 4/83  soft-tissue]
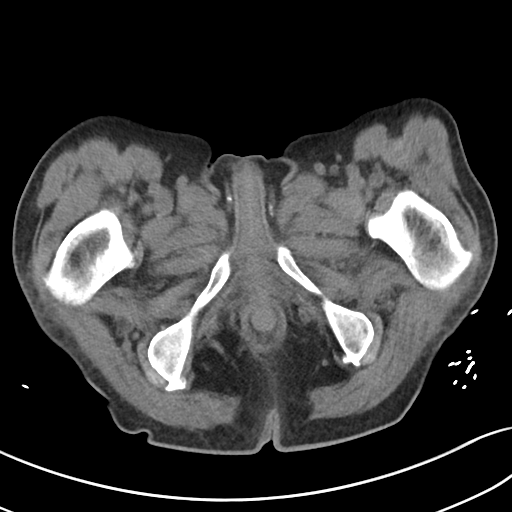
[im 4/83  bone]
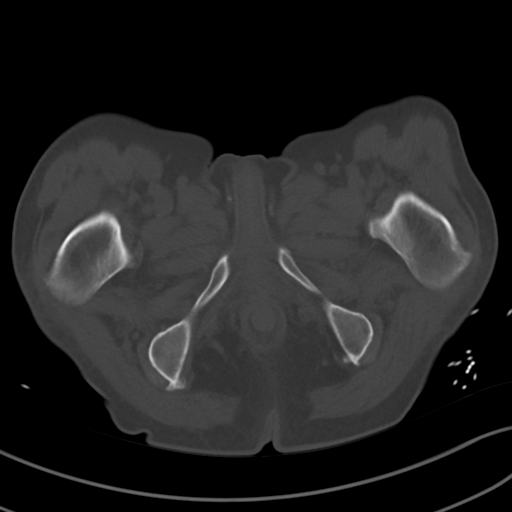
[im 11/83  soft-tissue]
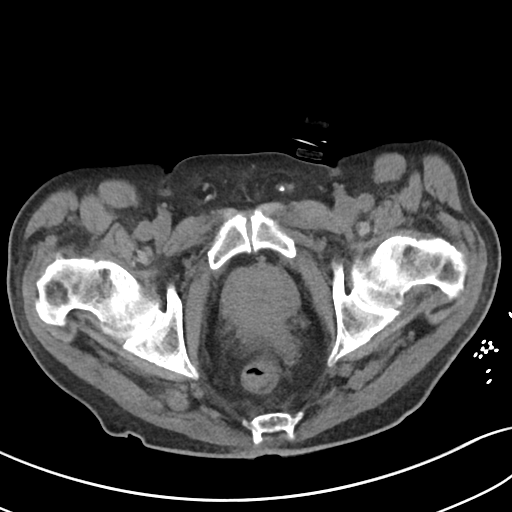
[im 18/83  soft-tissue]
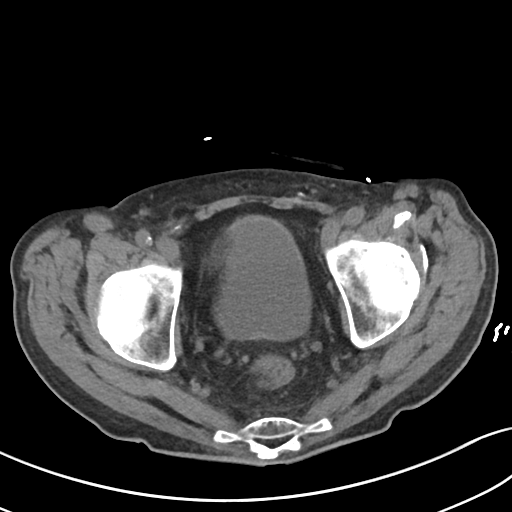
[im 24/83  soft-tissue]
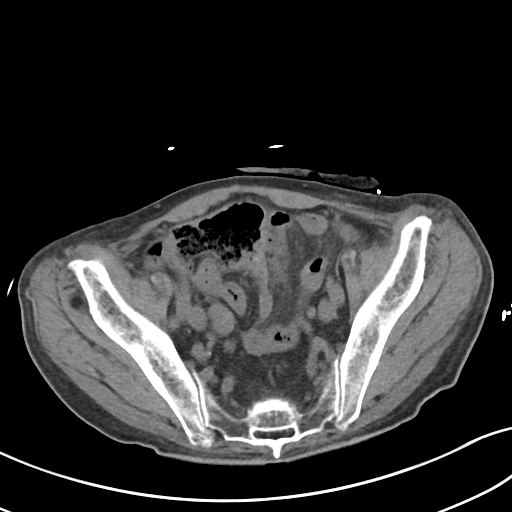
[im 28/83  soft-tissue]
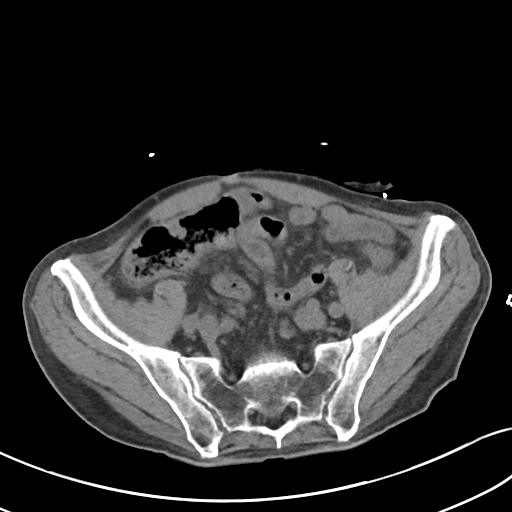
[im 35/83  soft-tissue]
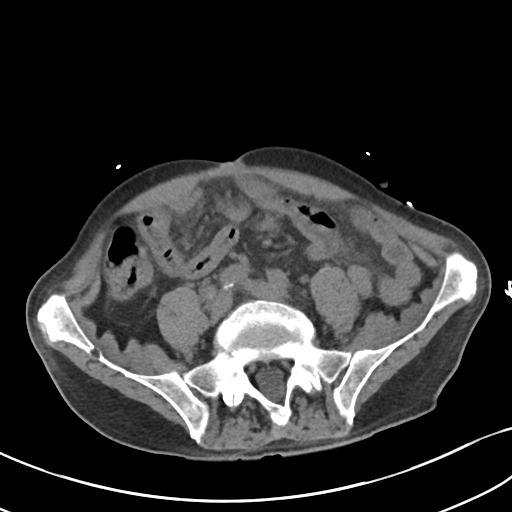
[im 42/83  soft-tissue]
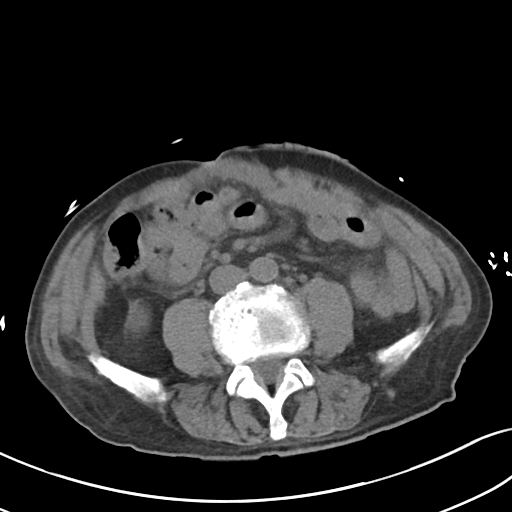
[im 48/83  soft-tissue]
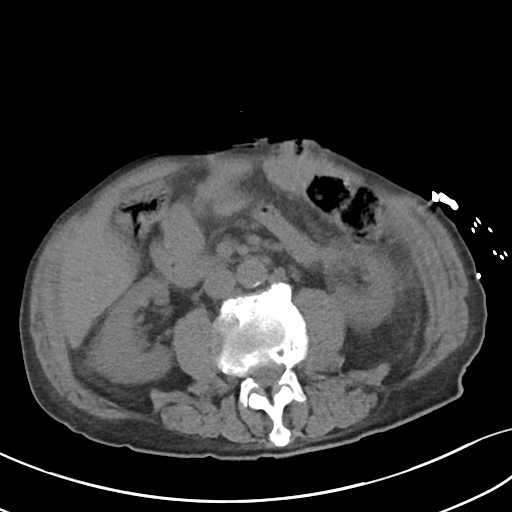
[im 55/83  soft-tissue]
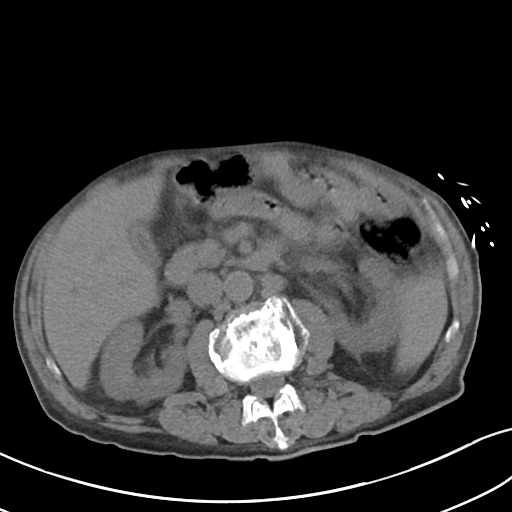
[im 55/83  bone]
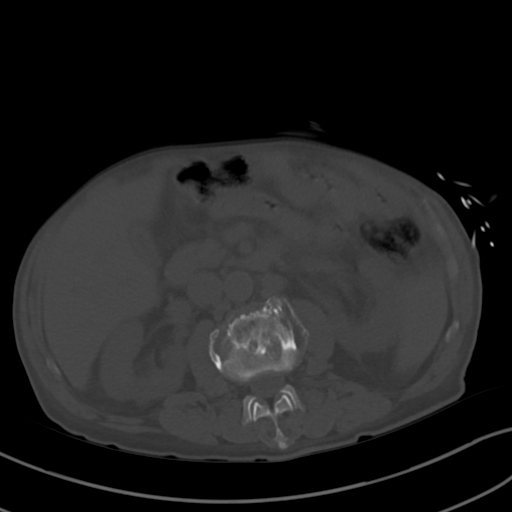
[im 59/83  soft-tissue]
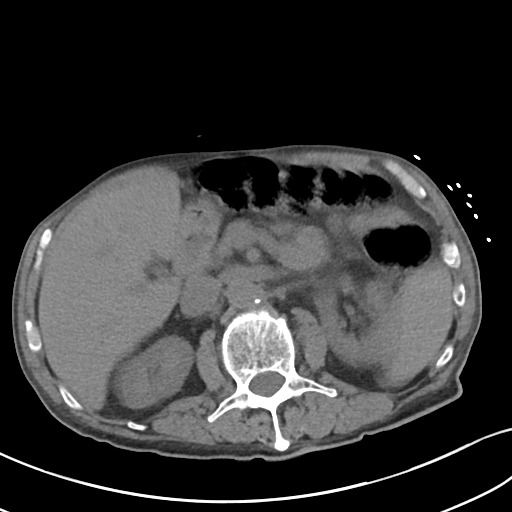
[im 65/83  soft-tissue]
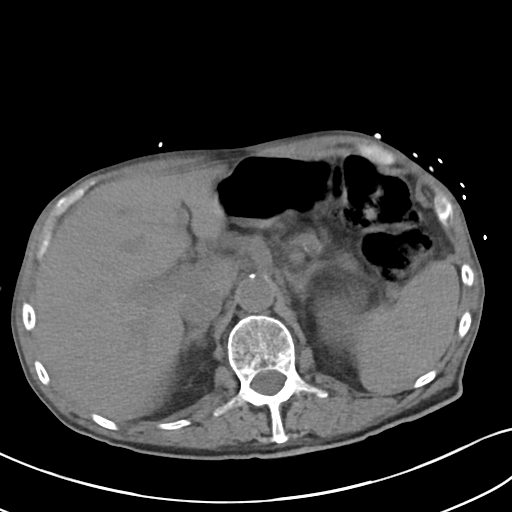
[im 72/83  soft-tissue]
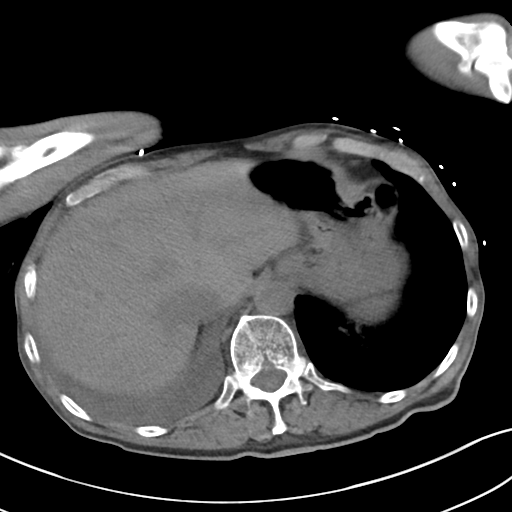
[im 79/83  soft-tissue]
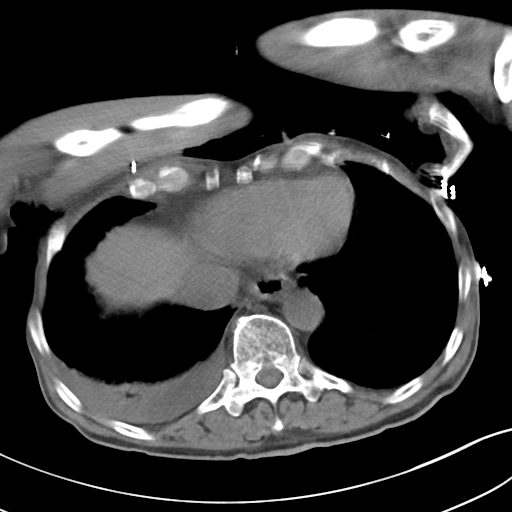

[Series 5: coronal · coronal · 0.77mm/px · 3 of 88 slices shown]
[im 30/88  soft-tissue]
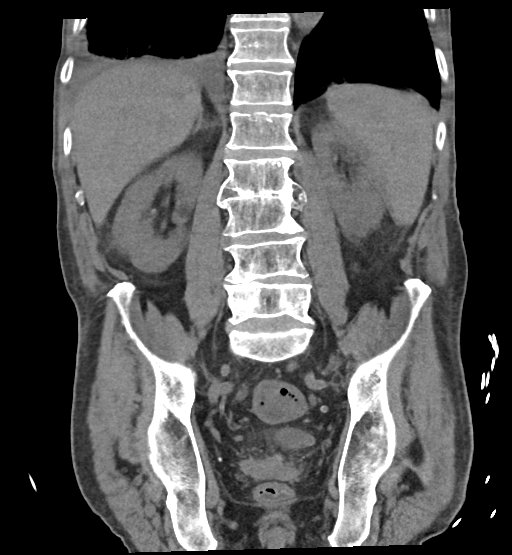
[im 39/88  soft-tissue]
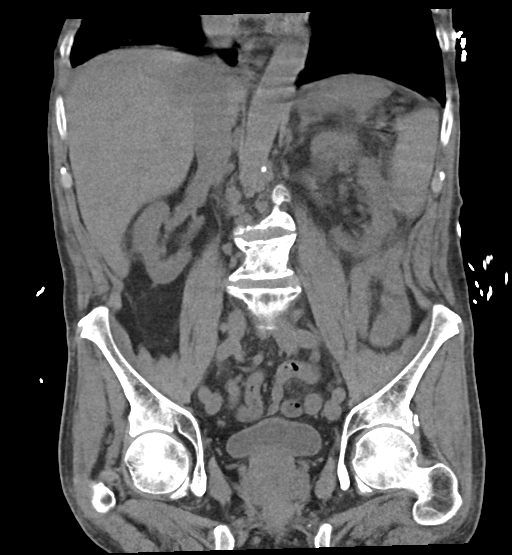
[im 49/88  soft-tissue]
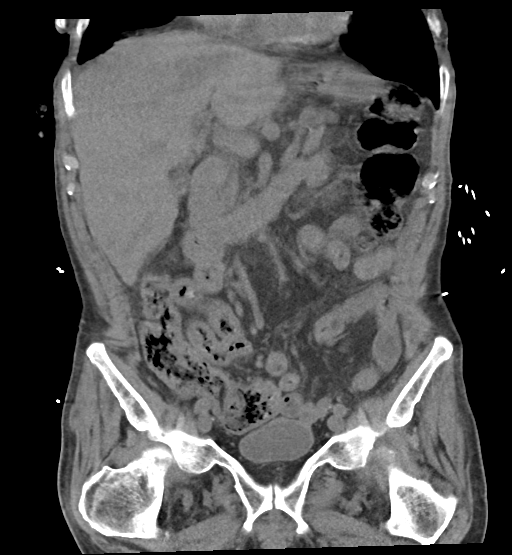

[16 of 46 positions shown; findings below may reference images not displayed]

FINDINGS: Lower chest: Ground-glass opacity and focal subpleural consolidation
within the visualized left lung base likely represents subsegmental
atelectasis. Small right pleural effusion with associated right
basilar compressive atelectasis. Cardiac size within normal limits.
No pericardial effusion.

Hepatobiliary: Scattered simple cyst within the left hepatic lobe
are unchanged. Liver otherwise unremarkable. Gallbladder
unremarkable. No intra or extrahepatic biliary ductal dilation.

Pancreas: Unremarkable

Spleen: Unremarkable

Adrenals/Urinary Tract: Adrenal glands are unremarkable. Kidneys are
normal, without renal calculi, focal lesion, or hydronephrosis.
Bladder is unremarkable.

Stomach/Bowel: Surgical changes of descending colostomy and [place]
Muadzin formation are identified. The stomach, small bowel, and
residual large bowel are otherwise unremarkable. No evidence of
obstruction or focal inflammation. The appendix is normal. No free
intraperitoneal gas or fluid.

Vascular/Lymphatic: Mild aortoiliac atherosclerotic calcification is
present. No aortic aneurysm. There is progressive pelvic and
retroperitoneal adenopathy. Index lymph node within the right
external iliac lymph node group measures 18 mm in short axis
diameter, axial image # 61/2. There is interval development of left
para-aortic and contralateral left common iliac adenopathy.

Reproductive: Moderate prostatic enlargement again noted. Seminal
vesicles are unremarkable.

Other: No abdominal wall hernia.  Rectum unremarkable.

Musculoskeletal: No acute bone abnormality. No lytic or blastic bone
lesion. Degenerative changes are seen within the lumbar spine.
IMPRESSION: Surgical changes of a distal colectomy with stable appearance of
descending colostomy and Karold Rocke formation. Progressive
pelvic and retroperitoneal pathologic adenopathy. No evidence of
obstruction.

Probable subsegmental atelectasis within the left lower lobe, not
fully assessed on this examination. Stable small right pleural
effusion with associated right basilar atelectasis.

Stable probable hepatic cysts.

Aortic Atherosclerosis ([id number]).

## 2022-09-03 IMAGING — DX DG CHEST 1V PORT
1 series · 2 of 2 positions shown · non-contrast
Comparison: Chest 08/18/2021

CLINICAL DATA: Question sepsis

EXAM:
PORTABLE CHEST 1 VIEW

[Series 1: chest ap · 0.14mm/px · 2 of 2 slices shown]
[im 1/2]
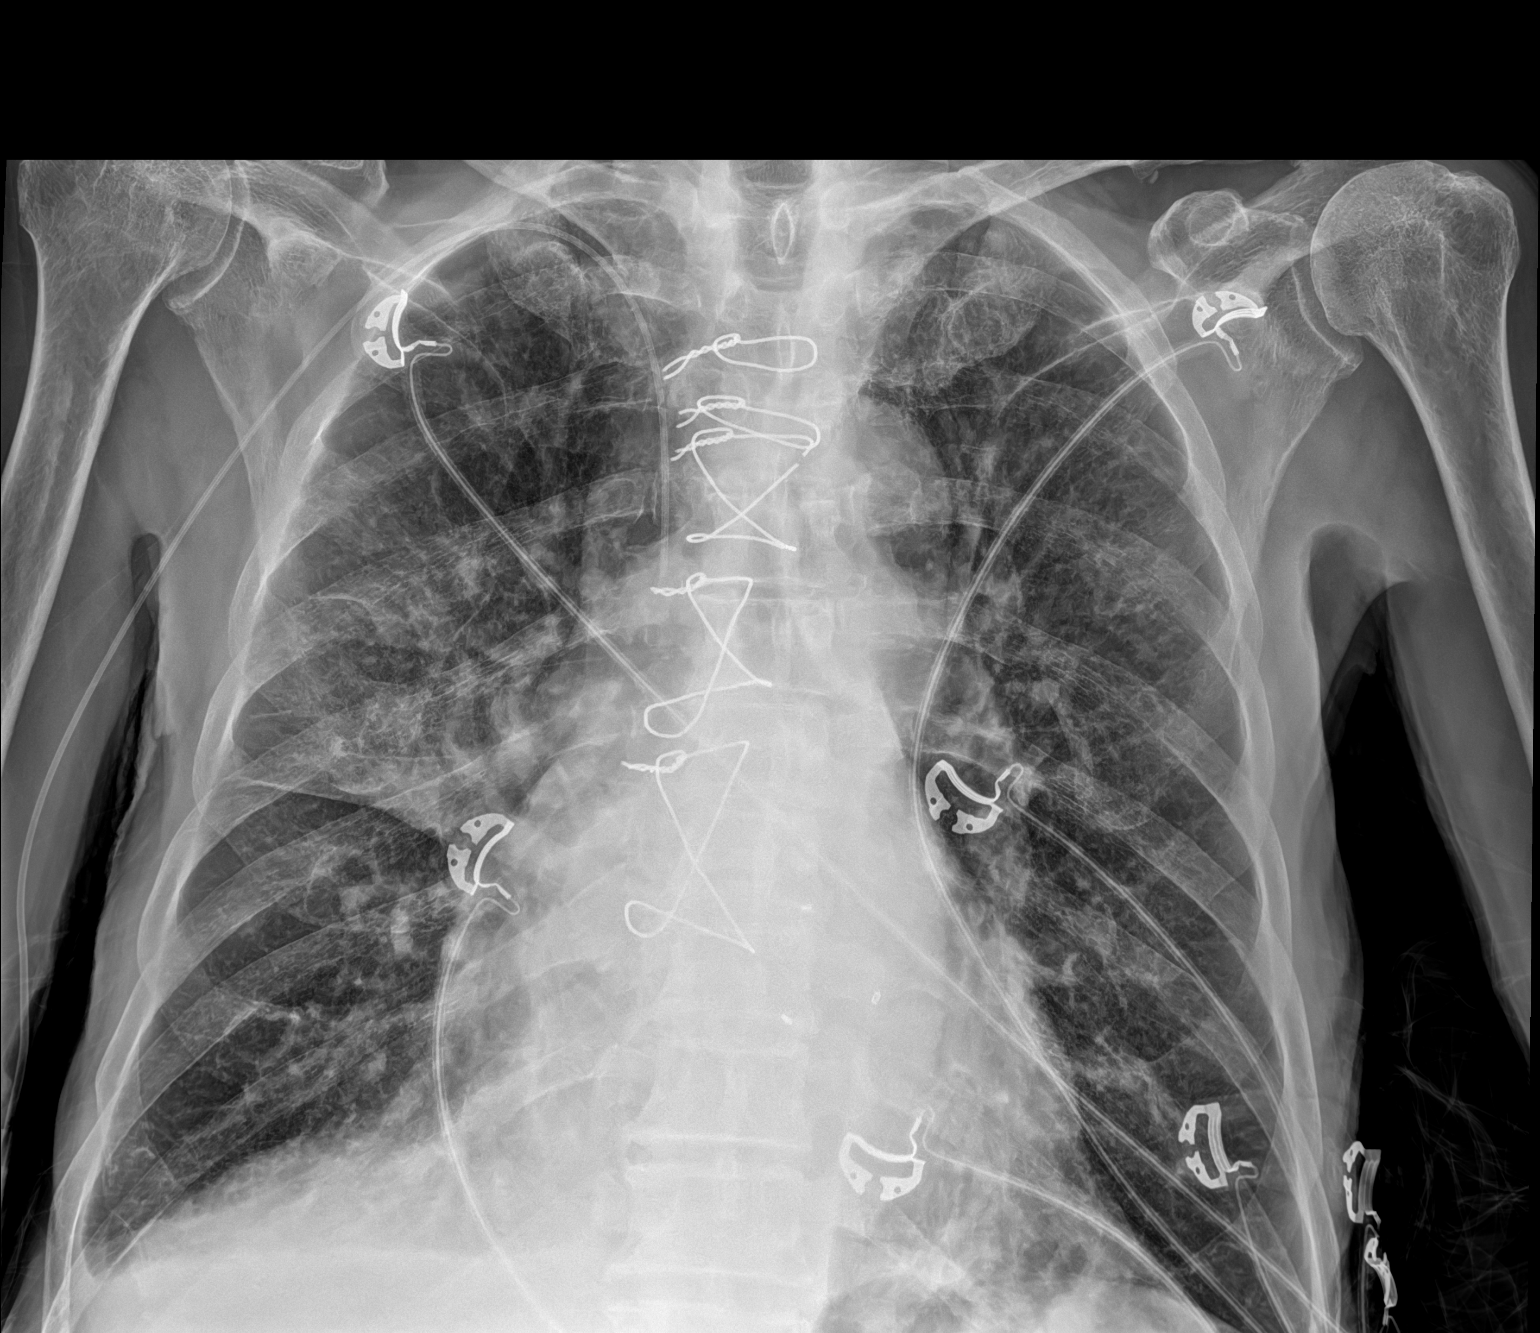
[im 2/2]
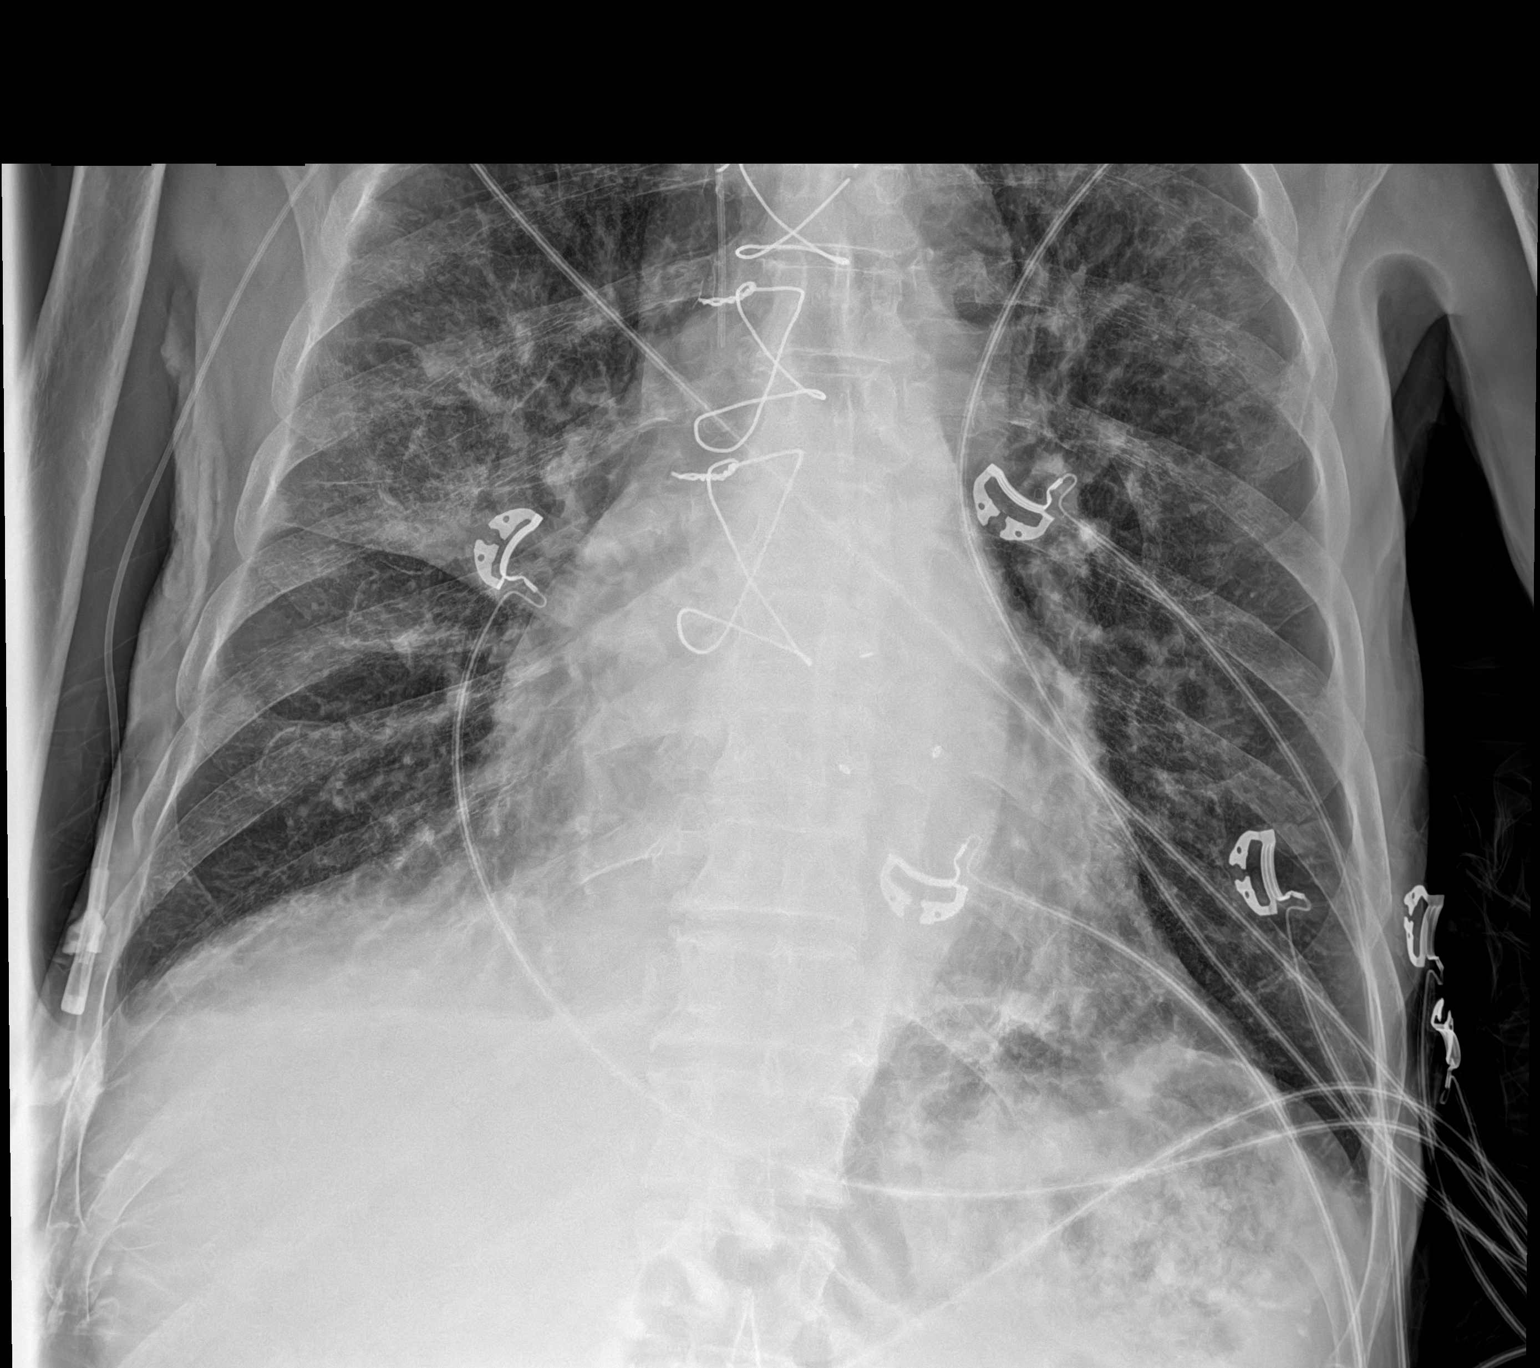

[2 of 2 positions shown; findings below may reference images not displayed]

FINDINGS: Median sternotomy. Heart size within normal limits. Negative for
heart failure

Right perihilar infiltrate possible pneumonia. Small right effusion.

Right arm PICC tip in the mid SVC.
IMPRESSION: Right perihilar infiltrate. Possible pneumonia. Small right
effusion.

## 2023-10-14 NOTE — Telephone Encounter (Signed)
 Refills
